# Patient Record
Sex: Female | Born: 1935 | Race: White | Hispanic: No | Marital: Married | Smoking: Never smoker
Health system: Southern US, Community
[De-identification: ages and names within clinical notes are randomized; demographics above are authoritative.]

## PROBLEM LIST (undated history)

## (undated) DIAGNOSIS — Q2730 Arteriovenous malformation, site unspecified: Secondary | ICD-10-CM

## (undated) DIAGNOSIS — E079 Disorder of thyroid, unspecified: Secondary | ICD-10-CM

## (undated) DIAGNOSIS — E611 Iron deficiency: Secondary | ICD-10-CM

## (undated) DIAGNOSIS — I350 Nonrheumatic aortic (valve) stenosis: Secondary | ICD-10-CM

## (undated) DIAGNOSIS — R55 Syncope and collapse: Secondary | ICD-10-CM

## (undated) DIAGNOSIS — Z952 Presence of prosthetic heart valve: Secondary | ICD-10-CM

## (undated) DIAGNOSIS — J45909 Unspecified asthma, uncomplicated: Secondary | ICD-10-CM

## (undated) DIAGNOSIS — F419 Anxiety disorder, unspecified: Secondary | ICD-10-CM

## (undated) DIAGNOSIS — D509 Iron deficiency anemia, unspecified: Secondary | ICD-10-CM

## (undated) DIAGNOSIS — I5032 Chronic diastolic (congestive) heart failure: Secondary | ICD-10-CM

## (undated) DIAGNOSIS — F29 Unspecified psychosis not due to a substance or known physiological condition: Secondary | ICD-10-CM

## (undated) DIAGNOSIS — E538 Deficiency of other specified B group vitamins: Secondary | ICD-10-CM

## (undated) DIAGNOSIS — S72009A Fracture of unspecified part of neck of unspecified femur, initial encounter for closed fracture: Secondary | ICD-10-CM

## (undated) HISTORY — DX: Iron deficiency anemia, unspecified: D50.9

## (undated) HISTORY — PX: FEMUR SURGERY: SHX943

## (undated) HISTORY — PX: HIP FRACTURE SURGERY: SHX118

## (undated) HISTORY — DX: Arteriovenous malformation, site unspecified: Q27.30

## (undated) HISTORY — DX: Syncope and collapse: R55

## (undated) HISTORY — DX: Fracture of unspecified part of neck of unspecified femur, initial encounter for closed fracture: S72.009A

## (undated) HISTORY — PX: FRACTURE SURGERY: SHX138

## (undated) HISTORY — PX: TUBAL LIGATION: SHX77

## (undated) HISTORY — DX: Unspecified asthma, uncomplicated: J45.909

## (undated) HISTORY — PX: BACK SURGERY: SHX140

## (undated) HISTORY — PX: PARTIAL HYSTERECTOMY: SHX80

## (undated) HISTORY — DX: Unspecified psychosis not due to a substance or known physiological condition: F29

## (undated) HISTORY — DX: Disorder of thyroid, unspecified: E07.9

## (undated) HISTORY — PX: CARDIAC CATHETERIZATION: SHX172

## (undated) HISTORY — PX: APPENDECTOMY: SHX54

---

## 2008-01-30 ENCOUNTER — Emergency Department: Payer: Self-pay | Admitting: Emergency Medicine

## 2008-12-05 ENCOUNTER — Inpatient Hospital Stay: Payer: Self-pay | Admitting: Specialist

## 2008-12-05 DIAGNOSIS — S62102A Fracture of unspecified carpal bone, left wrist, initial encounter for closed fracture: Secondary | ICD-10-CM | POA: Insufficient documentation

## 2008-12-10 ENCOUNTER — Encounter: Payer: Self-pay | Admitting: Internal Medicine

## 2009-01-01 ENCOUNTER — Encounter: Payer: Self-pay | Admitting: Internal Medicine

## 2009-06-13 ENCOUNTER — Ambulatory Visit: Payer: Self-pay | Admitting: Gastroenterology

## 2009-07-24 ENCOUNTER — Emergency Department: Payer: Self-pay | Admitting: Internal Medicine

## 2010-06-28 ENCOUNTER — Ambulatory Visit: Payer: Self-pay | Admitting: General Practice

## 2010-08-16 ENCOUNTER — Ambulatory Visit: Payer: Self-pay | Admitting: General Practice

## 2012-10-11 ENCOUNTER — Ambulatory Visit: Payer: Self-pay | Admitting: Emergency Medicine

## 2012-10-11 LAB — COMPREHENSIVE METABOLIC PANEL
Albumin: 4.1 g/dL (ref 3.4–5.0)
Alkaline Phosphatase: 94 U/L (ref 50–136)
Anion Gap: 9 (ref 7–16)
BUN: 22 mg/dL — ABNORMAL HIGH (ref 7–18)
Bilirubin,Total: 0.8 mg/dL (ref 0.2–1.0)
Calcium, Total: 9.2 mg/dL (ref 8.5–10.1)
Chloride: 105 mmol/L (ref 98–107)
Co2: 28 mmol/L (ref 21–32)
Creatinine: 0.74 mg/dL (ref 0.60–1.30)
EGFR (African American): >60
EGFR (Non-African Amer.): >60
Glucose: 78 mg/dL (ref 65–99)
Osmolality: 285 (ref 275–301)
Potassium: 3.8 mmol/L (ref 3.5–5.1)
SGOT(AST): 25 U/L (ref 15–37)
SGPT (ALT): 26 U/L (ref 12–78)
Sodium: 142 mmol/L (ref 136–145)
Total Protein: 7.9 g/dL (ref 6.4–8.2)

## 2012-10-11 LAB — CBC WITH DIFFERENTIAL/PLATELET
Basophil #: 0.1 10*3/uL (ref 0.0–0.1)
Basophil %: 1.1 %
Eosinophil #: 0 10*3/uL (ref 0.0–0.7)
Eosinophil %: 0.5 %
HCT: 36.5 % (ref 35.0–47.0)
HGB: 11.9 g/dL — ABNORMAL LOW (ref 12.0–16.0)
Lymphocyte #: 1.4 10*3/uL (ref 1.0–3.6)
Lymphocyte %: 25.4 %
MCH: 30.2 pg (ref 26.0–34.0)
MCHC: 32.6 g/dL (ref 32.0–36.0)
MCV: 93 fL (ref 80–100)
Monocyte #: 0.3 x10 3/mm (ref 0.2–0.9)
Monocyte %: 6.1 %
Neutrophil #: 3.7 10*3/uL (ref 1.4–6.5)
Neutrophil %: 66.9 %
Platelet: 210 10*3/uL (ref 150–440)
RBC: 3.94 10*6/uL (ref 3.80–5.20)
RDW: 13.7 % (ref 11.5–14.5)
WBC: 5.5 10*3/uL (ref 3.6–11.0)

## 2012-10-11 LAB — SEDIMENTATION RATE: Erythrocyte Sed Rate: 16 mm/hr (ref 0–30)

## 2012-12-02 ENCOUNTER — Ambulatory Visit: Payer: Self-pay | Admitting: Physician Assistant

## 2012-12-24 ENCOUNTER — Ambulatory Visit: Payer: Self-pay | Admitting: Physical Medicine and Rehabilitation

## 2013-01-05 ENCOUNTER — Ambulatory Visit: Payer: Self-pay | Admitting: Orthopedic Surgery

## 2013-01-05 DIAGNOSIS — J45901 Unspecified asthma with (acute) exacerbation: Secondary | ICD-10-CM

## 2013-01-05 LAB — CBC WITH DIFFERENTIAL/PLATELET
BASOS ABS: 0.1 10*3/uL (ref 0.0–0.1)
Basophil %: 1.8 %
EOS PCT: 2 %
Eosinophil #: 0.1 10*3/uL (ref 0.0–0.7)
HCT: 29.3 % — AB (ref 35.0–47.0)
HGB: 9.7 g/dL — AB (ref 12.0–16.0)
Lymphocyte #: 1.4 10*3/uL (ref 1.0–3.6)
Lymphocyte %: 31.3 %
MCH: 29.5 pg (ref 26.0–34.0)
MCHC: 33.1 g/dL (ref 32.0–36.0)
MCV: 89 fL (ref 80–100)
MONO ABS: 0.4 x10 3/mm (ref 0.2–0.9)
Monocyte %: 8.1 %
NEUTROS ABS: 2.6 10*3/uL (ref 1.4–6.5)
NEUTROS PCT: 56.8 %
PLATELETS: 189 10*3/uL (ref 150–440)
RBC: 3.29 10*6/uL — ABNORMAL LOW (ref 3.80–5.20)
RDW: 14.5 % (ref 11.5–14.5)
WBC: 4.5 10*3/uL (ref 3.6–11.0)

## 2013-01-06 ENCOUNTER — Ambulatory Visit: Payer: Self-pay | Admitting: Orthopedic Surgery

## 2013-01-07 LAB — PATHOLOGY REPORT

## 2013-01-21 ENCOUNTER — Emergency Department: Payer: Self-pay | Admitting: Emergency Medicine

## 2013-01-21 LAB — COMPREHENSIVE METABOLIC PANEL
Albumin: 4 g/dL (ref 3.4–5.0)
Alkaline Phosphatase: 75 U/L
Anion Gap: 7 (ref 7–16)
BILIRUBIN TOTAL: 0.6 mg/dL (ref 0.2–1.0)
BUN: 19 mg/dL — ABNORMAL HIGH (ref 7–18)
CALCIUM: 9.3 mg/dL (ref 8.5–10.1)
Chloride: 110 mmol/L — ABNORMAL HIGH (ref 98–107)
Co2: 24 mmol/L (ref 21–32)
Creatinine: 0.72 mg/dL (ref 0.60–1.30)
EGFR (Non-African Amer.): >60
GLUCOSE: 89 mg/dL (ref 65–99)
OSMOLALITY: 283 (ref 275–301)
Potassium: 3.4 mmol/L — ABNORMAL LOW (ref 3.5–5.1)
SGOT(AST): 32 U/L (ref 15–37)
SGPT (ALT): 21 U/L (ref 12–78)
SODIUM: 141 mmol/L (ref 136–145)
Total Protein: 7.3 g/dL (ref 6.4–8.2)

## 2013-01-21 LAB — CBC WITH DIFFERENTIAL/PLATELET
BASOS ABS: 0.1 10*3/uL (ref 0.0–0.1)
BASOS PCT: 1.2 %
EOS PCT: 0.8 %
Eosinophil #: 0 10*3/uL (ref 0.0–0.7)
HCT: 33.4 % — ABNORMAL LOW (ref 35.0–47.0)
HGB: 10.8 g/dL — AB (ref 12.0–16.0)
Lymphocyte #: 1.5 10*3/uL (ref 1.0–3.6)
Lymphocyte %: 31.5 %
MCH: 29.1 pg (ref 26.0–34.0)
MCHC: 32.2 g/dL (ref 32.0–36.0)
MCV: 90 fL (ref 80–100)
MONOS PCT: 7.5 %
Monocyte #: 0.4 x10 3/mm (ref 0.2–0.9)
Neutrophil #: 2.8 10*3/uL (ref 1.4–6.5)
Neutrophil %: 59 %
Platelet: 179 10*3/uL (ref 150–440)
RBC: 3.7 10*6/uL — ABNORMAL LOW (ref 3.80–5.20)
RDW: 14.5 % (ref 11.5–14.5)
WBC: 4.7 10*3/uL (ref 3.6–11.0)

## 2013-01-21 LAB — URINALYSIS, COMPLETE
BACTERIA: NONE SEEN
Bilirubin,UR: NEGATIVE
GLUCOSE, UR: NEGATIVE mg/dL (ref 0–75)
NITRITE: POSITIVE
PH: 5 (ref 4.5–8.0)
Protein: 30
RBC,UR: 3 /HPF (ref 0–5)
SPECIFIC GRAVITY: 1.023 (ref 1.003–1.030)
WBC UR: 75 /HPF (ref 0–5)

## 2013-01-21 LAB — LIPASE, BLOOD: Lipase: 258 U/L (ref 73–393)

## 2013-01-29 ENCOUNTER — Emergency Department: Payer: Self-pay | Admitting: Emergency Medicine

## 2013-01-29 LAB — CBC
HCT: 33.5 % — ABNORMAL LOW (ref 35.0–47.0)
HGB: 11.1 g/dL — ABNORMAL LOW (ref 12.0–16.0)
MCH: 29.7 pg (ref 26.0–34.0)
MCHC: 33.1 g/dL (ref 32.0–36.0)
MCV: 90 fL (ref 80–100)
Platelet: 132 10*3/uL — ABNORMAL LOW (ref 150–440)
RBC: 3.74 10*6/uL — ABNORMAL LOW (ref 3.80–5.20)
RDW: 14.4 % (ref 11.5–14.5)
WBC: 4.4 10*3/uL (ref 3.6–11.0)

## 2013-01-29 LAB — COMPREHENSIVE METABOLIC PANEL
ANION GAP: 4 — AB (ref 7–16)
Albumin: 3.6 g/dL (ref 3.4–5.0)
Alkaline Phosphatase: 72 U/L
BUN: 16 mg/dL (ref 7–18)
Bilirubin,Total: 0.7 mg/dL (ref 0.2–1.0)
CHLORIDE: 110 mmol/L — AB (ref 98–107)
CO2: 27 mmol/L (ref 21–32)
CREATININE: 0.85 mg/dL (ref 0.60–1.30)
Calcium, Total: 9 mg/dL (ref 8.5–10.1)
EGFR (African American): >60
Glucose: 92 mg/dL (ref 65–99)
Osmolality: 282 (ref 275–301)
Potassium: 3.3 mmol/L — ABNORMAL LOW (ref 3.5–5.1)
SGOT(AST): 29 U/L (ref 15–37)
SGPT (ALT): 14 U/L (ref 12–78)
Sodium: 141 mmol/L (ref 136–145)
TOTAL PROTEIN: 7 g/dL (ref 6.4–8.2)

## 2013-01-29 LAB — URINALYSIS, COMPLETE
BILIRUBIN, UR: NEGATIVE
Blood: NEGATIVE
Glucose,UR: NEGATIVE mg/dL (ref 0–75)
Ketone: NEGATIVE
Nitrite: NEGATIVE
Ph: 5 (ref 4.5–8.0)
RBC,UR: 2 /HPF (ref 0–5)
Specific Gravity: 1.019 (ref 1.003–1.030)

## 2013-01-29 LAB — LIPASE, BLOOD: Lipase: 203 U/L (ref 73–393)

## 2013-04-24 ENCOUNTER — Ambulatory Visit (INDEPENDENT_AMBULATORY_CARE_PROVIDER_SITE_OTHER): Payer: Medicare Other | Admitting: Cardiovascular Disease

## 2013-04-24 ENCOUNTER — Encounter: Payer: Self-pay | Admitting: Cardiovascular Disease

## 2013-04-24 ENCOUNTER — Encounter (INDEPENDENT_AMBULATORY_CARE_PROVIDER_SITE_OTHER): Payer: Self-pay

## 2013-04-24 VITALS — BP 150/78 | HR 70 | Ht 65.0 in | Wt 117.2 lb

## 2013-04-24 DIAGNOSIS — R011 Cardiac murmur, unspecified: Secondary | ICD-10-CM

## 2013-04-24 DIAGNOSIS — R030 Elevated blood-pressure reading, without diagnosis of hypertension: Secondary | ICD-10-CM

## 2013-04-24 DIAGNOSIS — R0602 Shortness of breath: Secondary | ICD-10-CM

## 2013-04-24 NOTE — Patient Instructions (Addendum)
Your physician has requested that you have an echocardiogram. Echocardiography is a painless test that uses sound waves to create images of your heart. It provides your doctor with information about the size and shape of your heart and how well your heart's chambers and valves are working. This procedure takes approximately one hour. There are no restrictions for this procedure.  [facility] MY[facility]IEW  Your caregiver has ordered a Stress Test with nuclear imaging. The purpose of this test is to evaluate the blood supply to your heart muscle. This procedure is referred to as a "Non-Invasive Stress Test." This is because other than having an IV started in your vein, nothing is inserted or "invades" your body. Cardiac stress tests are done to find areas of poor blood flow to the heart by determining the extent of coronary artery disease (CAD). Some patients exercise on a treadmill, which naturally increases the blood flow to your heart, while others who are  unable to walk on a treadmill due to physical limitations have a pharmacologic/chemical stress agent called Lexiscan . This medicine will mimic walking on a treadmill by temporarily increasing your coronary blood flow.   Please note: these test may take anywhere between 2-4 hours to complete  PLEASE REPORT TO [facility]  THE VOLUNTEERS AT THE FIRST DESK WILL DIRECT YOU WHERE TO GO  Date of Procedure:_______________05/05/15______________________  Arrival Time for Procedure:__________0745 am____________________  Instructions regarding medication:    PLEASE NOTIFY THE OFFICE AT LEAST 24 HOURS IN ADVANCE IF YOU ARE UNABLE TO KEEP YOUR APPOINTMENT.  [phone] AND  PLEASE NOTIFY [facility] AT [facility] AT LEAST 24 HOURS IN ADVANCE IF YOU ARE UNABLE TO KEEP YOUR APPOINTMENT. [phone]  How to prepare for your Myoview test:  1. Do not eat or drink after midnight 2. No caffeine for 24 hours prior to test 3. No smoking 24 hours prior  to test. 4. Your medication may be taken with water.  If your doctor stopped a medication because of this test, do not take that medication. 5. Ladies, please do not wear dresses.  Skirts or pants are appropriate. Please wear a short sleeve shirt. 6. No perfume, cologne or lotion. 7. Wear comfortable walking shoes. No heels!       Your physician recommends that you schedule a follow-up appointment in:  After your tests

## 2013-04-26 ENCOUNTER — Encounter: Payer: Self-pay | Admitting: Cardiovascular Disease

## 2013-04-26 DIAGNOSIS — R030 Elevated blood-pressure reading, without diagnosis of hypertension: Secondary | ICD-10-CM | POA: Insufficient documentation

## 2013-04-26 DIAGNOSIS — R0602 Shortness of breath: Secondary | ICD-10-CM | POA: Insufficient documentation

## 2013-04-26 DIAGNOSIS — R011 Cardiac murmur, unspecified: Secondary | ICD-10-CM | POA: Insufficient documentation

## 2013-04-26 NOTE — Assessment & Plan Note (Signed)
She has a cardiac murmur suggestive of LVOT obstruction and less likely aortic stenosis. Given her worsening exertional dyspnea, I recommend an echocardiogram for evaluation.

## 2013-04-26 NOTE — Assessment & Plan Note (Signed)
She reports significant exertional dyspnea without chest pain. She is at risk for coronary artery disease. I recommend evaluation with a pharmacologic nuclear stress test.

## 2013-04-26 NOTE — Assessment & Plan Note (Signed)
Continue monitoring for now and consider treatment based on cardiac workup.

## 2013-04-26 NOTE — Progress Notes (Addendum)
Primary care physician: [place] [place]le[place] [facility] [place]  HPI  This is a pleasant 80 year old female who is self-referred for evaluation of dyspnea. [place] [facility] [place] to be close to family. She continues to see her primary care physician there. She is not aware of any previous cardiac history. She reports elevated blood pressure over the last 1-2 years but has not been on medications. There is no history of diabetes or hyperlipidemia. She reports history of asthma and hip surgery. Previous records are not available. She was told about a heart murmur few months ago. She reports having a syncopal episode about 9 years ago and was associated with tachycardia. She is not aware of documented arrhythmia. She noticed worsening exertional dyspnea without chest pain over the last few months. Recently, she was walking with her dog which got loose. She chased the dog and became extremely dyspneic. No orthopnea or PND. There is family history of coronary artery disease but not prematurely. There is no history of tobacco or alcohol use.  Some of her records were reviewed. She saw Dr. Gifford ShaveNeelam ann Shaukat Khan in 2011. She had an echocardiogram which showed normal LV systolic function with mild mitral regurgitation. A nuclear stress test was normal. Carotid Doppler was also reported to be normal.  Allergies  Allergen Reactions  . Penicillins     antibiotics     No current outpatient prescriptions on file prior to visit.   No current facility-administered medications on file prior to visit.     Past Medical History  Diagnosis Date  . Fractured hip   . Broken wrist   . Syncope and collapse   . Heart murmur   . Arrhythmia   . Asthma   . Broken back      Past Surgical History  Procedure Laterality Date  . Hip fracture surgery    . Tubal ligation    . Partial hysterectomy    . Back surgery      back fusion     Family History  Problem  Relation Age of Onset  . Heart disease Sister      History   Social History  . Marital Status: Married    Spouse Name: N/A    Number of Children: N/A  . Years of Education: N/A   Occupational History  . Not on file.   Social History Main Topics  . Smoking status: Never Smoker   . Smokeless tobacco: Not on file  . Alcohol Use: No  . Drug Use: No  . Sexual Activity: Not on file   Other Topics Concern  . Not on file   Social History Narrative  . No narrative on file     ROS A 10 point review of system was performed. It is negative other than that mentioned in the history of present illness.   PHYSICAL EXAM   BP 150/78  Pulse 70  Ht 5\' 5"  (1.651 m)  Wt 117 lb 4 oz (53.184 kg)  BMI 19.51 kg/m2 Constitutional: She is oriented to person, place, and time. She appears well-developed and well-nourished. No distress.  HENT: No nasal discharge.  Head: Normocephalic and atraumatic.  Eyes: Pupils are equal and round. No discharge.  Neck: Normal range of motion. Neck supple. No JVD present. No thyromegaly present.  Cardiovascular: Normal rate, regular rhythm, normal heart sounds. Exam reveals no gallop and no friction rub. There is a 3/6 systolic ejection murmur at the left sternal border  and aortic area. The murmur is mid peaking with preserved S2 and a louder with Valsalva. Pulmonary/Chest: Effort normal and breath sounds normal. No stridor. No respiratory distress. She has no wheezes. She has no rales. She exhibits no tenderness.  Abdominal: Soft. Bowel sounds are normal. She exhibits no distension. There is no tenderness. There is no rebound and no guarding.  Musculoskeletal: Normal range of motion. She exhibits no edema and no tenderness.  Neurological: She is alert and oriented to person, place, and time. Coordination normal.  Skin: Skin is warm and dry. No rash noted. She is not diaphoretic. No erythema. No pallor.  Psychiatric: She has a normal mood and affect. Her  behavior is normal. Judgment and thought content normal.     EKG: Normal sinus rhythm with left atrial enlargement, left ventricular hypertrophy with repolarization abnormalities   ASSESSMENT AND PLAN

## 2013-04-30 ENCOUNTER — Other Ambulatory Visit (INDEPENDENT_AMBULATORY_CARE_PROVIDER_SITE_OTHER): Payer: Medicare Other

## 2013-04-30 ENCOUNTER — Other Ambulatory Visit: Payer: Self-pay

## 2013-04-30 DIAGNOSIS — I369 Nonrheumatic tricuspid valve disorder, unspecified: Secondary | ICD-10-CM

## 2013-04-30 DIAGNOSIS — R0989 Other specified symptoms and signs involving the circulatory and respiratory systems: Secondary | ICD-10-CM

## 2013-04-30 DIAGNOSIS — R0602 Shortness of breath: Secondary | ICD-10-CM

## 2013-04-30 DIAGNOSIS — R0609 Other forms of dyspnea: Secondary | ICD-10-CM

## 2013-05-05 ENCOUNTER — Ambulatory Visit: Payer: Self-pay | Admitting: Cardiovascular Disease

## 2013-05-05 DIAGNOSIS — R079 Chest pain, unspecified: Secondary | ICD-10-CM

## 2013-05-06 ENCOUNTER — Other Ambulatory Visit: Payer: Self-pay

## 2013-05-06 DIAGNOSIS — R0602 Shortness of breath: Secondary | ICD-10-CM

## 2013-05-12 ENCOUNTER — Telehealth: Payer: Self-pay

## 2013-05-12 DIAGNOSIS — R0602 Shortness of breath: Secondary | ICD-10-CM

## 2013-05-12 DIAGNOSIS — R011 Cardiac murmur, unspecified: Secondary | ICD-10-CM

## 2013-05-12 NOTE — Telephone Encounter (Signed)
Spoke w/ French Anaracy.  We will try to work pt in on Friday if it is convenient for her.  Left message for pt to call back.

## 2013-05-12 NOTE — Telephone Encounter (Signed)
Message copied by Marilynne HalstedMOODY, Tacara Hadlock R on Tue May 12, 2013  8:43 AM ------      Message from: Antonieta IbaGOLLAN, TIMOTHY J      Created: Mon May 11, 2013  2:09 PM       Can we check with French Anaracy to seen when patient might be able to come in for a few minutes to take a few more pictures of her aortic valve. No all measurements made on recent echo.      Free of charge      thx      Tim            ----- Message -----         From: Iran OuchMuhammad A Arida, MD         Sent: 05/07/2013   3:43 PM           To: Antonieta Ibaimothy J Gollan, MD, Alonza Smokerracy L Farver            I reviewed the echo images done on my patient. Aortic valve is calcified and seems stenotic. However, I don't see no measured gradients or area. Was that not done?        ------

## 2013-05-12 NOTE — Telephone Encounter (Signed)
Message copied by Marilynne HalstedMOODY, Lorenz Donley R on Tue May 12, 2013  8:42 AM ------      Message from: Antonieta IbaGOLLAN, TIMOTHY J      Created: Mon May 11, 2013  2:09 PM       Can we check with French Anaracy to seen when patient might be able to come in for a few minutes to take a few more pictures of her aortic valve. No all measurements made on recent echo.      Free of charge      thx      Tim            ----- Message -----         From: Iran OuchMuhammad A Arida, MD         Sent: 05/07/2013   3:43 PM           To: Antonieta Ibaimothy J Gollan, MD, Alonza Smokerracy L Farver            I reviewed the echo images done on my patient. Aortic valve is calcified and seems stenotic. However, I don't see no measured gradients or area. Was that not done?        ------

## 2013-05-12 NOTE — Telephone Encounter (Signed)
Pt sched to come in for ECHO Friday, 05/15/13 @ 3:30.

## 2013-05-15 ENCOUNTER — Other Ambulatory Visit: Payer: Medicare Other

## 2013-06-01 ENCOUNTER — Telehealth: Payer: Self-pay

## 2013-06-01 NOTE — Telephone Encounter (Signed)
Informed patient that per Dr. Kirke Corin her echo did not change from the original  Please follow up in  6 months  Patient verbalized understanding

## 2013-06-01 NOTE — Telephone Encounter (Signed)
Pt would like to know echo results. Please call. 

## 2013-07-06 DIAGNOSIS — M7060 Trochanteric bursitis, unspecified hip: Secondary | ICD-10-CM | POA: Insufficient documentation

## 2013-07-06 DIAGNOSIS — M169 Osteoarthritis of hip, unspecified: Secondary | ICD-10-CM | POA: Insufficient documentation

## 2013-07-27 ENCOUNTER — Ambulatory Visit (INDEPENDENT_AMBULATORY_CARE_PROVIDER_SITE_OTHER): Payer: Medicare Other | Admitting: Cardiovascular Disease

## 2013-07-27 ENCOUNTER — Encounter: Payer: Self-pay | Admitting: Cardiovascular Disease

## 2013-07-27 VITALS — BP 138/60 | HR 68 | Ht 65.0 in | Wt 121.2 lb

## 2013-07-27 DIAGNOSIS — R0602 Shortness of breath: Secondary | ICD-10-CM

## 2013-07-27 DIAGNOSIS — I359 Nonrheumatic aortic valve disorder, unspecified: Secondary | ICD-10-CM

## 2013-07-27 DIAGNOSIS — I350 Nonrheumatic aortic (valve) stenosis: Secondary | ICD-10-CM

## 2013-07-27 NOTE — Patient Instructions (Signed)
Your physician wants you to follow-up in: 6 months with Dr. Kirke CorinArida. You will receive a reminder letter in the mail two months in advance. If you don't receive a letter, please call our office to schedule the follow-up appointment.   Please follow low sodium diet given

## 2013-07-27 NOTE — Assessment & Plan Note (Signed)
Nuclear stress test was negative. Dyspnea is likely multifactorial. There is likely a component of diastolic dysfunction. Pulmonary pressure was elevated at 47 mm mercury. The correlation of dyspnea with increased sodium intake supports this. I suggested a small dose loop diuretic. However, the patient is hesitant to take any medications. We provided her with low sodium diet instructions. We can consider a small dose diuretic if she does not improve. If symptoms persist, I will consider proceeding with a right and left cardiac catheterization.

## 2013-07-27 NOTE — Progress Notes (Signed)
Primary care physician: Dr. Meredeth Ide in [facility]  HPI  This is a pleasant 80 year old female who is here today for a followup visit regarding dyspnea. She moved from [facility] about 6 years ago to be close to family. She continues to see her primary care physician there. She was seen a few months ago for exertional dyspnea and elevated blood pressure.  There is no history of diabetes or hyperlipidemia. She reports history of asthma and hip surgery.   She saw Dr. Gifford Shave in 2011. She had an echocardiogram which showed normal LV systolic function with mild mitral regurgitation. A nuclear stress test was normal. Carotid Doppler was also reported to be normal. She was noted to have a cardiac murmur suggestive of possible  aortic stenosis versus LVOT obstruction. She underwent an echocardiogram which showed normal LV systolic function, grade 1 diastolic dysfunction, moderate aortic stenosis with a mean gradient of 26 mm marker and valve area of 1.1. There was mild mitral regurgitation with estimated systolic pulmonary artery pressure 47 mm mercury. She underwent a pharmacologic nuclear stress test which showed no evidence of ischemia. She continues to complain of exertional dyspnea especially if she consumes excessive amount of sodium. No chest discomfort.  Allergies  Allergen Reactions  . Penicillins     antibiotics     Current Outpatient Prescriptions on File Prior to Visit  Medication Sig Dispense Refill  . Cholecalciferol (VITAMIN D-3) 1000 UNITS CAPS Take by mouth daily.      . Flaxseed, Linseed, (FLAXSEED OIL PO) Take by mouth daily.       No current facility-administered medications on file prior to visit.     Past Medical History  Diagnosis Date  . Fractured hip   . Broken wrist   . Syncope and collapse   . Heart murmur   . Arrhythmia   . Asthma   . Broken back      Past Surgical History  Procedure Laterality Date  .  Hip fracture surgery    . Tubal ligation    . Partial hysterectomy    . Back surgery      back fusion     Family History  Problem Relation Age of Onset  . Heart disease Sister      History   Social History  . Marital Status: Married    Spouse Name: N/A    Number of Children: N/A  . Years of Education: N/A   Occupational History  . Not on file.   Social History Main Topics  . Smoking status: Never Smoker   . Smokeless tobacco: Not on file  . Alcohol Use: No  . Drug Use: No  . Sexual Activity: Not on file   Other Topics Concern  . Not on file   Social History Narrative  . No narrative on file     ROS A 10 point review of system was performed. It is negative other than that mentioned in the history of present illness.   PHYSICAL EXAM   BP 138/60  Pulse 68  Ht 5\' 5"  (1.651 m)  Wt 121 lb 4 oz (54.999 kg)  BMI 20.18 kg/m2 Constitutional: She is oriented to person, place, and time. She appears well-developed and well-nourished. No distress.  HENT: No nasal discharge.  Head: Normocephalic and atraumatic.  Eyes: Pupils are equal and round. No discharge.  Neck: Normal range of motion. Neck supple. No JVD present. No thyromegaly present.  Cardiovascular: Normal rate, regular  rhythm, normal heart sounds. Exam reveals no gallop and no friction rub. There is a 3/6 systolic ejection murmur at the left sternal border and aortic area. The murmur is mid peaking with preserved S2 . Pulmonary/Chest: Effort normal and breath sounds normal. No stridor. No respiratory distress. She has no wheezes. She has no rales. She exhibits no tenderness.  Abdominal: Soft. Bowel sounds are normal. She exhibits no distension. There is no tenderness. There is no rebound and no guarding.  Musculoskeletal: Normal range of motion. She exhibits +1 edema and no tenderness.  Neurological: She is alert and oriented to person, place, and time. Coordination normal.  Skin: Skin is warm and dry. No rash  noted. She is not diaphoretic. No erythema. No pallor.  Psychiatric: She has a normal mood and affect. Her behavior is normal. Judgment and thought content normal.       ASSESSMENT AND PLAN

## 2013-07-27 NOTE — Assessment & Plan Note (Signed)
The patient has moderate aortic stenosis for which I recommend observation. The degree of stenosis does not explain her dyspnea. I will plan on repeating an echocardiogram in one year.

## 2013-12-16 DIAGNOSIS — M5416 Radiculopathy, lumbar region: Secondary | ICD-10-CM | POA: Insufficient documentation

## 2013-12-28 ENCOUNTER — Telehealth: Payer: Self-pay | Admitting: Cardiovascular Disease

## 2013-12-28 NOTE — Telephone Encounter (Signed)
Patient stated she had a medical visit last Thursday and her HB was 7.22 Patient stated they told her to call her PCP and find out if he recommends she have a blood transfusion  Patient wants to know if Dr. Kirke CorinArida can check her blood an order a blood transfusion if need be    I informed patient per verbal from Dr. Kirke CorinArida   Patient needs to follow up with her PCP or go to the ED  Patient verbalized understanding

## 2013-12-28 NOTE — Telephone Encounter (Signed)
Pt needs a blood transfusion and wants to know if doctor Kirke Corinarida can help. All her doctors are in [place] and she said last time she was here doctor Kirke Corinarida helped her. Please advise.

## 2014-01-01 ENCOUNTER — Ambulatory Visit: Payer: Self-pay | Admitting: Internal Medicine

## 2014-01-04 ENCOUNTER — Inpatient Hospital Stay: Payer: Self-pay | Admitting: Specialist

## 2014-01-04 LAB — COMPREHENSIVE METABOLIC PANEL
ALK PHOS: 57 U/L
ANION GAP: 7 (ref 7–16)
Albumin: 3.2 g/dL — ABNORMAL LOW (ref 3.4–5.0)
BUN: 22 mg/dL — ABNORMAL HIGH (ref 7–18)
Bilirubin,Total: 0.4 mg/dL (ref 0.2–1.0)
CO2: 25 mmol/L (ref 21–32)
Calcium, Total: 8 mg/dL — ABNORMAL LOW (ref 8.5–10.1)
Chloride: 112 mmol/L — ABNORMAL HIGH (ref 98–107)
Creatinine: 0.94 mg/dL (ref 0.60–1.30)
EGFR (Non-African Amer.): >60
Glucose: 88 mg/dL (ref 65–99)
OSMOLALITY: 290 (ref 275–301)
Potassium: 3.7 mmol/L (ref 3.5–5.1)
SGOT(AST): 28 U/L (ref 15–37)
SGPT (ALT): 19 U/L
SODIUM: 144 mmol/L (ref 136–145)
Total Protein: 6.3 g/dL — ABNORMAL LOW (ref 6.4–8.2)

## 2014-01-04 LAB — FERRITIN: Ferritin (ARMC): 7 ng/mL — ABNORMAL LOW (ref 8–388)

## 2014-01-04 LAB — CBC
HCT: 18.4 % — AB (ref 35.0–47.0)
HGB: 5.5 g/dL — ABNORMAL LOW (ref 12.0–16.0)
MCH: 26.6 pg (ref 26.0–34.0)
MCHC: 30 g/dL — AB (ref 32.0–36.0)
MCV: 89 fL (ref 80–100)
Platelet: 183 10*3/uL (ref 150–440)
RBC: 2.08 10*6/uL — ABNORMAL LOW (ref 3.80–5.20)
RDW: 16.9 % — AB (ref 11.5–14.5)
WBC: 3.6 10*3/uL (ref 3.6–11.0)

## 2014-01-04 LAB — IRON AND TIBC
IRON BIND. CAP.(TOTAL): 360 ug/dL (ref 250–450)
IRON: 21 ug/dL — AB (ref 50–170)
Iron Saturation: 6 %
Unbound Iron-Bind.Cap.: 339 ug/dL

## 2014-01-04 LAB — PROTIME-INR
INR: 1.1
Prothrombin Time: 13.6 secs (ref 11.5–14.7)

## 2014-01-04 LAB — APTT: Activated PTT: 38.8 secs — ABNORMAL HIGH (ref 23.6–35.9)

## 2014-01-04 LAB — TROPONIN I: Troponin-I: <0.02 ng/mL

## 2014-01-05 LAB — CBC WITH DIFFERENTIAL/PLATELET
BASOS PCT: 2.3 %
Basophil #: 0.1 10*3/uL (ref 0.0–0.1)
Basophil #: 0.1 10*3/uL (ref 0.0–0.1)
Basophil #: 0.1 10*3/uL (ref 0.0–0.1)
Basophil %: 1.6 %
Basophil %: 1.4 %
EOS PCT: 3.4 %
EOS PCT: 2.1 %
Eosinophil #: 0.2 10*3/uL (ref 0.0–0.7)
Eosinophil #: 0.1 10*3/uL (ref 0.0–0.7)
Eosinophil #: 0.2 10*3/uL (ref 0.0–0.7)
Eosinophil %: 3.1 %
HCT: 28.1 % — AB (ref 35.0–47.0)
HCT: 28.6 % — AB (ref 35.0–47.0)
HCT: 30.6 % — AB (ref 35.0–47.0)
HGB: 9.1 g/dL — AB (ref 12.0–16.0)
HGB: 9.2 g/dL — ABNORMAL LOW (ref 12.0–16.0)
HGB: 9.6 g/dL — AB (ref 12.0–16.0)
LYMPHS PCT: 32 %
LYMPHS PCT: 17.8 %
Lymphocyte #: 1.8 10*3/uL (ref 1.0–3.6)
Lymphocyte #: 0.9 10*3/uL — ABNORMAL LOW (ref 1.0–3.6)
Lymphocyte #: 1.5 10*3/uL (ref 1.0–3.6)
Lymphocyte %: 25.9 %
MCH: 28 pg (ref 26.0–34.0)
MCH: 28.2 pg (ref 26.0–34.0)
MCH: 27.6 pg (ref 26.0–34.0)
MCHC: 32.3 g/dL (ref 32.0–36.0)
MCHC: 32 g/dL (ref 32.0–36.0)
MCHC: 31.2 g/dL — ABNORMAL LOW (ref 32.0–36.0)
MCV: 87 fL (ref 80–100)
MCV: 88 fL (ref 80–100)
MCV: 89 fL (ref 80–100)
MONOS PCT: 7.3 %
MONOS PCT: 7.6 %
Monocyte #: 0.5 x10 3/mm (ref 0.2–0.9)
Monocyte #: 0.4 x10 3/mm (ref 0.2–0.9)
Monocyte #: 0.4 x10 3/mm (ref 0.2–0.9)
Monocyte %: 8.6 %
NEUTROS ABS: 3 10*3/uL (ref 1.4–6.5)
NEUTROS ABS: 3.7 10*3/uL (ref 1.4–6.5)
NEUTROS PCT: 53.7 %
Neutrophil #: 3.6 10*3/uL (ref 1.4–6.5)
Neutrophil %: 71.2 %
Neutrophil %: 62 %
PLATELETS: 183 10*3/uL (ref 150–440)
Platelet: 178 10*3/uL (ref 150–440)
Platelet: 189 10*3/uL (ref 150–440)
RBC: 3.25 10*6/uL — AB (ref 3.80–5.20)
RBC: 3.25 10*6/uL — ABNORMAL LOW (ref 3.80–5.20)
RBC: 3.46 10*6/uL — ABNORMAL LOW (ref 3.80–5.20)
RDW: 16 % — ABNORMAL HIGH (ref 11.5–14.5)
RDW: 16 % — AB (ref 11.5–14.5)
RDW: 15.8 % — AB (ref 11.5–14.5)
WBC: 5.6 10*3/uL (ref 3.6–11.0)
WBC: 5.3 10*3/uL (ref 3.6–11.0)
WBC: 5.8 10*3/uL (ref 3.6–11.0)

## 2014-01-05 LAB — OCCULT BLOOD X 1 CARD TO LAB, STOOL: Occult Blood, Feces: NEGATIVE

## 2014-01-05 LAB — FOLATE: Folic Acid: 30.2 ng/mL — ABNORMAL HIGH (ref 3.1–17.5)

## 2014-01-06 LAB — UR PROT ELECTROPHORESIS, URINE RANDOM

## 2014-01-06 LAB — PROT IMMUNOELECTROPHORES(ARMC)

## 2014-01-19 ENCOUNTER — Ambulatory Visit: Payer: Self-pay | Admitting: Internal Medicine

## 2014-01-19 LAB — CBC CANCER CENTER
BASOS ABS: 0.1 x10 3/mm (ref 0.0–0.1)
BASOS PCT: 1.7 %
EOS ABS: 0.1 x10 3/mm (ref 0.0–0.7)
Eosinophil %: 1.8 %
HCT: 30.9 % — ABNORMAL LOW (ref 35.0–47.0)
HGB: 9.9 g/dL — ABNORMAL LOW (ref 12.0–16.0)
Lymphocyte #: 1.3 x10 3/mm (ref 1.0–3.6)
Lymphocyte %: 22.7 %
MCH: 28.3 pg (ref 26.0–34.0)
MCHC: 32 g/dL (ref 32.0–36.0)
MCV: 88 fL (ref 80–100)
MONOS PCT: 7 %
Monocyte #: 0.4 x10 3/mm (ref 0.2–0.9)
NEUTROS PCT: 66.8 %
Neutrophil #: 3.8 x10 3/mm (ref 1.4–6.5)
Platelet: 229 x10 3/mm (ref 150–440)
RBC: 3.49 10*6/uL — ABNORMAL LOW (ref 3.80–5.20)
RDW: 19 % — ABNORMAL HIGH (ref 11.5–14.5)
WBC: 5.6 x10 3/mm (ref 3.6–11.0)

## 2014-02-01 ENCOUNTER — Ambulatory Visit: Payer: Self-pay | Admitting: Internal Medicine

## 2014-02-19 ENCOUNTER — Emergency Department: Payer: Self-pay | Admitting: Emergency Medicine

## 2014-03-01 ENCOUNTER — Encounter: Payer: Self-pay | Admitting: Cardiovascular Disease

## 2014-03-01 ENCOUNTER — Ambulatory Visit (INDEPENDENT_AMBULATORY_CARE_PROVIDER_SITE_OTHER): Payer: Medicare Other | Admitting: Cardiovascular Disease

## 2014-03-01 VITALS — BP 138/48 | HR 65 | Ht 65.0 in | Wt 119.5 lb

## 2014-03-01 DIAGNOSIS — I350 Nonrheumatic aortic (valve) stenosis: Secondary | ICD-10-CM

## 2014-03-01 DIAGNOSIS — R030 Elevated blood-pressure reading, without diagnosis of hypertension: Secondary | ICD-10-CM

## 2014-03-01 DIAGNOSIS — K922 Gastrointestinal hemorrhage, unspecified: Secondary | ICD-10-CM | POA: Insufficient documentation

## 2014-03-01 DIAGNOSIS — K921 Melena: Secondary | ICD-10-CM

## 2014-03-01 DIAGNOSIS — R079 Chest pain, unspecified: Secondary | ICD-10-CM

## 2014-03-01 NOTE — Assessment & Plan Note (Signed)
Given presence of aortic stenosis, I recommend that we don't lower blood pressure below 140 systolic.

## 2014-03-01 NOTE — Patient Instructions (Signed)
Your physician wants you to follow-up in:  6 months. You will receive a reminder letter in the mail two months in advance. If you don't receive a letter, please call our office to schedule the follow-up appointment.   

## 2014-03-01 NOTE — Assessment & Plan Note (Signed)
Given the presence of aortic stenosis. This might be due to AV malformation.

## 2014-03-01 NOTE — Progress Notes (Signed)
Primary care physician: Dr. [place]leming [place]. she is going to establish with Dr. Judithann SheenSparks  HPI  This is a pleasant 81 year old female who is here today for a followup visit regarding dyspnea. [place] [facility] to be close to family. She continued to see her primary care physician there. She was seen last year for exertional dyspnea and elevated blood pressure.  There is no history of diabetes or hyperlipidemia. She reports history of asthma and hip surgery.   She saw Dr. Ezekiel SlocumbNeelam and Adrian BlackwaterShaukat Khan in 2011. She had an echocardiogram which showed normal LV systolic function with mild mitral regurgitation. A nuclear stress test was normal. Carotid Doppler was also reported to be normal. She was noted to have a cardiac murmur suggestive of possible  aortic stenosis. She underwent an echocardiogram which showed normal LV systolic function, grade 1 diastolic dysfunction, moderate aortic stenosis with a mean gradient of 26 mm marker and valve area of 1.1. There was mild mitral regurgitation with estimated systolic pulmonary artery pressure 47 mm mercury. She underwent a pharmacologic nuclear stress test which showed no evidence of ischemia. In December 2015, she had black stool and was found to be severely anemic with a hemoglobin of 7.2. [place] in [place] and underwent transfusion. According to the patient, no obvious source of bleeding was identified in spite of extensive GI workup. She was diagnosed with severe iron deficiency anemia and was started on IV iron with improvement. She is feeling better.  Allergies  Allergen Reactions  . Penicillins     antibiotics     No current outpatient prescriptions on file prior to visit.   No current facility-administered medications on file prior to visit.     Past Medical History  Diagnosis Date  . Fractured hip   . Broken wrist   . Syncope and collapse   . Heart murmur   . Arrhythmia     . Asthma   . Broken back   . Broken wrist      Past Surgical History  Procedure Laterality Date  . Hip fracture surgery    . Tubal ligation    . Partial hysterectomy    . Back surgery      back fusion     Family History  Problem Relation Age of Onset  . Heart disease Sister      History   Social History  . Marital Status: Married    Spouse Name: N/A  . Number of Children: N/A  . Years of Education: N/A   Occupational History  . Not on file.   Social History Main Topics  . Smoking status: Never Smoker   . Smokeless tobacco: Not on file  . Alcohol Use: No  . Drug Use: No  . Sexual Activity: Not on file   Other Topics Concern  . Not on file   Social History Narrative     ROS A 10 point review of system was performed. It is negative other than that mentioned in the history of present illness.   PHYSICAL EXAM   BP 138/48 mmHg  Pulse 65  Ht 5\' 5"  (1.651 m)  Wt 119 lb 8 oz (54.205 kg)  BMI 19.89 kg/m2 Constitutional: She is oriented to person, place, and time. She appears well-developed and well-nourished. No distress.  HENT: No nasal discharge.  Head: Normocephalic and atraumatic.  Eyes: Pupils are equal and round. No discharge.  Neck: Normal range of motion. Neck  supple. No JVD present. No thyromegaly present.  Cardiovascular: Normal rate, regular rhythm, normal heart sounds. Exam reveals no gallop and no friction rub. There is a 3/6 systolic ejection murmur at the left sternal border and aortic area. The murmur is mid peaking with preserved S2 . Pulmonary/Chest: Effort normal and breath sounds normal. No stridor. No respiratory distress. She has no wheezes. She has no rales. She exhibits no tenderness.  Abdominal: Soft. Bowel sounds are normal. She exhibits no distension. There is no tenderness. There is no rebound and no guarding.  Musculoskeletal: Normal range of motion. She exhibits +1 edema and no tenderness.  Neurological: She is alert and  oriented to person, place, and time. Coordination normal.  Skin: Skin is warm and dry. No rash noted. She is not diaphoretic. No erythema. No pallor.  Psychiatric: She has a normal mood and affect. Her behavior is normal. Judgment and thought content normal.     ZOX:WRUEA  Rhythm  -Left atrial enlargement.   -Poor R-wave progression -nonspecific -consider old anterior infarct.   BORDERLINE   ASSESSMENT AND PLAN

## 2014-03-01 NOTE — Assessment & Plan Note (Signed)
She has moderate aortic stenosis for which I recommend continued observation and monitoring. There is no indication for surgery at the present time.

## 2014-03-02 ENCOUNTER — Ambulatory Visit
Admit: 2014-03-02 | Disposition: A | Discharge status: Home / Self Care | Payer: Self-pay | Attending: Internal Medicine | Admitting: Internal Medicine

## 2014-03-03 ENCOUNTER — Ambulatory Visit: Payer: Self-pay | Admitting: Unknown Physician Specialty

## 2014-03-05 DIAGNOSIS — J4520 Mild intermittent asthma, uncomplicated: Secondary | ICD-10-CM | POA: Insufficient documentation

## 2014-03-30 LAB — CBC CANCER CENTER
BASOS PCT: 1.6 %
Basophil #: 0.1 x10 3/mm (ref 0.0–0.1)
EOS ABS: 0.1 x10 3/mm (ref 0.0–0.7)
Eosinophil %: 1.4 %
HCT: 31.9 % — ABNORMAL LOW (ref 35.0–47.0)
HGB: 10.4 g/dL — AB (ref 12.0–16.0)
Lymphocyte #: 1.2 x10 3/mm (ref 1.0–3.6)
Lymphocyte %: 28.4 %
MCH: 30.1 pg (ref 26.0–34.0)
MCHC: 32.8 g/dL (ref 32.0–36.0)
MCV: 92 fL (ref 80–100)
MONO ABS: 0.3 x10 3/mm (ref 0.2–0.9)
MONOS PCT: 6.1 %
NEUTROS PCT: 62.5 %
Neutrophil #: 2.7 x10 3/mm (ref 1.4–6.5)
Platelet: 184 x10 3/mm (ref 150–440)
RBC: 3.47 10*6/uL — AB (ref 3.80–5.20)
RDW: 15.7 % — ABNORMAL HIGH (ref 11.5–14.5)
WBC: 4.4 x10 3/mm (ref 3.6–11.0)

## 2014-03-30 LAB — IRON AND TIBC
IRON: 47 ug/dL
Iron Bind.Cap.(Total): 318 (ref 250–450)
Iron Saturation: 14.8
UNBOUND IRON-BIND. CAP.: 271.1

## 2014-03-30 LAB — FERRITIN: Ferritin (ARMC): 46 ng/mL

## 2014-04-02 ENCOUNTER — Ambulatory Visit
Admit: 2014-04-02 | Disposition: A | Discharge status: Home / Self Care | Payer: Self-pay | Attending: Internal Medicine | Admitting: Internal Medicine

## 2014-04-24 NOTE — Op Note (Signed)
PATIENT NAME:  Kelsey Le, Kelsey Le MR#:  [id number] DATE OF BIRTH:  05/01/1930  DATE OF PROCEDURE:  01/06/2013  PREOPERATIVE DIAGNOSIS: T10 compression fracture.   POSTOPERATIVE DIAGNOSIS: T10 compression fracture.  PROCEDURE: T10 biopsy and kyphoplasty.   ANESTHESIA: MAC.   SURGEON: Kennedy BuckerMichael Klyn Kroening, M.D.   DESCRIPTION OF PROCEDURE: The patient was brought to the Operating Room and after adequate sedation was given, the patient was placed prone, and Le-arm was brought in to get good visualization of the vertebral T10 body, checking both AP and lateral projections. After a timeout procedure was completed, local anesthetic was infiltrated in the area of the planned incisions. The back was then prepped and draped in the usual sterile manner, repeat timeout procedure completed. Local anesthetic was infiltrated down to the pedicle on the left and right sides using a spinal needle with 0.5%, 50/50 mix of Sensorcaine with epinephrine and Xylocaine. A small stab incision was made on the right side and the trocar advanced in a transpedicular approach. A biopsy was obtained. The bone appeared somewhat soft. Drilling was carried out across the midline and with the balloon inflation, there appeared to be good elevation, and partial reduction of the deformity, and with the balloon going well past the midline, it was felt only a single-sided stick would be required.  Cement was mixed and 3 mL of cement was infiltrated. This appeared to give good fill with good interdigitation and trocar was removed with permanent Le-arm views obtained. The wound was closed with Dermabond, followed by a Band-Aid. The patient was sent to the recovery room in stable condition.   ESTIMATED BLOOD LOSS: Minimal.   COMPLICATIONS: None.   SPECIMEN: T10 vertebral body biopsy.     ____________________________ Leitha SchullerMichael J. Azani Brogdon, MD mjm:cc D: 01/06/2013 18:52:29 ET T: 01/06/2013 19:04:03 ET JOB#: [id number]  cc: Leitha SchullerMichael J. Georgia Baria, MD,  <Dictator> Leitha SchullerMICHAEL J Jassica Zazueta MD ELECTRONICALLY SIGNED 01/07/2013 8:22

## 2014-05-02 NOTE — Consult Note (Signed)
PATIENT NAME:  Kelsey Le, Kelsey Le MR#:  [id number] DATE OF BIRTH:  1930-09-26  DATE OF CONSULTATION:  01/05/2014  REFERRING PHYSICIAN:  Dow AdolphMatthew Rein, MD CONSULTING PHYSICIAN:  Kelsey Leathers R. Sherrlyn HockPandit, MD  REASON FOR CONSULTATION: Severe iron deficiency anemia; unable to tolerate oral iron.   HISTORY OF PRESENT ILLNESS: The patient is an 81 year old female with past medical history significant for history of asthma, hypertension not on medication, chronic impaired hearing and chronic anemia, along with multiple surgeries as listed below. She has been admitted to the hospital with progressive generalized weakness, dyspnea on exertion for the past few months, but symptoms increased quickly for 1-2 days prior to admission. She also has been having intermittent dark stools for the past 3-4 months. She reportedly has had GI work-[place] in [place]. The patient's daughter was also present at bedside, and they state that colonoscopy probably showed some polyps, EGD was mostly unremarkable, and she also reportedly had a capsule study, which was reportedly unremarkable.   Upon admission, hemoglobin was down to 5.5 and hematocrit of 18.4. She has received packed red blood cell transfusion and is clinically feeling better. Workup so far indicates significant iron deficiency, given ferritin of 7, iron saturation of 6%, and serum iron of 21. The patient states that she is unable to tolerate even over-the-counter low-dose iron preparation, since 1 dose causes severe GI upset and nausea and vomiting. Hematology has been consulted for evaluation of anemia and parenteral iron therapy.   PAST MEDICAL HISTORY: As in HPI above.  PAST SURGICAL HISTORY: Hemorrhoidectomy, appendectomy, bilateral tubal ligation, cataract surgery, bladder surgery, left hip surgery, back surgery with kyphoplasty in January 2015.   FAMILY HISTORY: Remarkable for heart disease. Denies hematological disorders or malignancy.   SOCIAL HISTORY: Denies  smoking or alcohol usage. The patient [place]between [place] and [place], a few months at a time. Her husband reportedly lives in [place]. She is physically active and ambulatory.   HOME MEDICATIONS: Fexofenadine nasal spray as needed.   ALLERGIES: No known drug allergies.   REVIEW OF SYSTEMS:  CONSTITUTIONAL: As in HPI. No fevers or chills. No night sweats.  HEENT: Denies headaches, dizziness, epistaxis, ear or jaw pain. No sinus symptoms.  CARDIAC: Currently denies angina, palpitation, orthopnea, or PND.  LUNGS: Has mild dyspnea on exertion. No new cough, sputum, hemoptysis, or chest pain.  GASTROINTESTINAL: As in HPI.  GENITOURINARY: No dysuria or hematuria.  SKIN: No new rashes or pruritus.  HEMATOLOGIC: As in HPI. No other bleeding issues, except for intermittent dark stools.  EXTREMITIES: No new swelling or pain.  MUSCULOSKELETAL: Denies new pain, redness or swelling in joints.  No new bone pain.  NEUROLOGIC: No new focal weakness, seizures, or loss of consciousness.  ENDOCRINE: No polyuria or polydipsia. Denies unintentional weight loss.   PHYSICAL EXAMINATION:  GENERAL: The patient is a moderately built, thin individual. Alert and oriented, and converses appropriately. In no acute distress. No tachypnea. No icterus. Mild pallor.  VITAL SIGNS: Temperature 98.1, heart rate 63, respiratory rate 18, blood pressure 124/52, oxygen saturation 97% on room air.  HEENT: Normocephalic, atraumatic. Extraocular movements intact. Sclerae anicteric. No oral thrush.  NECK: Negative for lymphadenopathy.  CARDIOVASCULAR: S1, S2. Regular rate and rhythm.  LUNGS: Show bilateral good air entry; no crepitations or rhonchi noted.  ABDOMEN: Soft, nontender; no hepatosplenomegaly or masses palpable clinically.  EXTREMITIES: No major edema or cyanosis.  SKIN: No generalized rashes or major bruising.  LYMPHATICS: Negative for inguinal adenopathy.  NEUROLOGIC: Cranial nerves intact. Grossly  nonfocal.   LABORATORY RESULTS: On 01/04/2014: Serum iron 21, iron saturation 6%, ferritin of 7. PTT 38.8, INR 1.1. Creatinine 0.94, calcium 8.0. LFTs unremarkable, except albumin low at 3.2. Hemoglobin 5.5, WBC 3600, platelets 183,000, MCV 89. Stool Hemoccult in ED reportedly positive; but lab stool Hemoccult was negative on 01/05/2014. On 01/05/2014, hemoglobin better at 9.1, WBC normal at 5600 with unremarkable differential, platelets 178,000.   IMPRESSION AND RECOMMENDATIONS: An 81 year old female patient with a history of medical problems as described above, more recently has had persistent anemia and iron deficiency, along with intermittent dark stools, admitted with progressive anemia causing symptoms, and has required blood transfusion support. She reportedly had recent GI evaluation, including capsule endoscopy, EGD and colonoscopy at [facility]  a few months ago, which did not reveal any obvious source of GI bleeding. She has been seen by Dr. Shelle Iron here, and is being planned for EGD. Labs so far show significant iron deficiency anemia. As stated above, the patient is intolerant of oral iron. Agree that she would definitely benefit from parenteral iron therapy.   I have discussed the rationale, benefits and possible side effects of Venofer treatment, and she is agreeable to this. We will give first dose of Venofer 500 mg IV today over 4 hours. Serum B12 level is unremarkable. Lab stool Hemoccult is negative. Will get further anemia workup, including folate, direct Coombs test, serum erythropoietin, and serum and random urine protein immunoelectrophoresis to rule out plasma cell dyscrasia. If patient is discharged soon, will follow up as outpatient and plan second dose of Venofer and then ill need continued monitoring and plan further parenteral iron therapy. I have also explained that if remaining anemia work-up returns abnormal, we will need to intervene as indicated. The patient is agreeable to  this plan.   Thank you for the referral. Please feel free to contact me for additional questions.    ____________________________ Maren Reamer Sherrlyn Hock, MD srp:MT D: 01/06/2014 08:39:00 ET T: 01/06/2014 09:09:36 ET JOB#: [id number]  cc: Eliel Dudding R. Sherrlyn Hock, MD, <Dictator> Wille Celeste MD ELECTRONICALLY SIGNED 01/06/2014 11:13

## 2014-05-02 NOTE — H&P (Signed)
PATIENT NAME:  Kelsey Le, Kelsey Le MR#:  [id number] DATE OF BIRTH:  03-01-1930  DATE OF ADMISSION:  01/04/2014  REFERRING PHYSICIAN:  Jene Every, MD   PRIMARY CARE PHYSICIAN: Nonlocal.  GASTROENTEROLOGIST:  Cathie Hoops, MD from [place].   CHIEF COMPLAINT:  1.  Generalized weakness, shortness of breath, ongoing for the past few months but with increased symptoms for the past 1 day.  2.  Passing of dark stools for the past 3 to 4 months.   HISTORY OF PRESENT ILLNESS: An 81 year old Caucasian female, a pleasant lady, with history of chronic iron deficiency anemia status post complete GI workup recently, history of asthma which is in remission, and history of borderline hypertension, presents to the Emergency Room with the complaints of worsening of generalized weakness and shortness of breath of 1 day duration. The patient stated that she has a history of chronic anemia and of late she has been having positive passing of black stools for the past 3 to 4 months for which she consulted her primary care practitioner and she had seen a gastroenterologist in [place] who did a complete work-up including upper endoscopy, colonoscopy and capsule endoscopy and as per the patient on these studies were negative, and presumably these studies were performed in the last 2 to 3 months.  The patient resides in [place] and moves in between [place] and [place] and in the process of moving to [place] in the next few months.   Denies any abdominal pain. No nausea. No vomiting and no history of chronic GERD symptoms.  No history of any chronic nonsteroidal anti-inflammatory medications.  Denies any chest pain, no dizziness, no palpitations.  No fever, no cough. No urinary symptoms such as frequency, urgency, or dysuria.  The patient states she had these symptoms a few months ago, about 3 months ago, for which she was admitted to a local hospital in [place] and following that admission, she had a  complete GI workup which as per the patient were unremarkable.  In the Emergency Room, the patient was evaluated by the ED physician and was found to have severe anemia with hemoglobin of 5.5 and hematocrit of 18.4, but her vital signs were stable.  Type and crossmatch and 2 units of packed red blood cells were ordered and currently her first unit of blood transfusion is in progress.   PAST MEDICAL HISTORY:  1.  History of asthma, status post immunotherapy and currently in remission.  2.  History of chronic anemia status post a complete GI workup done in the last 2 to 3 months.  3.  History of hypertension, which is borderline and the patient does not take any medications at this time.  4.  History of chronic impaired hearing and uses hearing aids.   PAST SURGICAL HISTORY:  1.  Left hip surgery.  2.  Bladder surgery.  3.  Status post hemorrhoidectomy.  4.  Appendectomy.  5.  Bilateral tubal ligation.  6.  Cataract surgery.  7.  Back surgery with kyphoplasty done in January 2015.   HOME MEDICATIONS: Fexofenadine on and off, nasal spray on and off.   ALLERGIES: No known drug allergies.   FAMILY HISTORY: Significant for heart problems in the family members. No history of any cancers.   SOCIAL HISTORY: She is married and she lives in between a few months in [place] and a few months in [place].  Her husband lives in [place].  No history of any smoking, alcohol, or drug usage.    REVIEW  OF SYSTEMS:  CONSTITUTIONAL: Negative for fever, positive for generalized weakness and fatigue, ongoing for the past 3 to 4 months. No excessive weight gain or weight loss.  EYES: Negative for blurred vision, double vision. No pain. No redness. No inflammation.  EARS, NOSE, AND THROAT: Negative for tinnitus or ear pain. History of hearing loss for which she uses bilateral hearing aids.  RESPIRATORY: Negative for cough, wheezing, hemoptysis, dyspnea. She does have shortness of breath on exertion.  History of asthma, for which she underwent immunotherapy and currently in remission,  CARDIOVASCULAR: Negative for chest pain. No orthopnea. No pedal edema. No palpitations. No dizziness or syncope.  GASTROINTESTINAL: Negative for nausea, vomiting, diarrhea, abdominal pain. No hematemesis. Does have ongoing melena for the past 3 to 4 months.  GENITOURINARY: Negative for dysuria, hematuria, frequency, or urgency.  ENDOCRINE: Negative for polyuria or nocturia. No heat or cold intolerance.  HEMATOLOGIC: Positive for chronic  anemia. Negative for easy bruising. She does have some melena ongoing for the past few months.  MUSCULOSKELETAL: Negative for arthritis, joint pain or swelling.   NEUROLOGICAL: Negative for focal weakness or numbness. No history of CVA, transient ischemic attack, or seizure disorder.  PSYCHIATRIC: Negative for anxiety, insomnia, or depression.   PHYSICAL EXAMINATION:  VITAL SIGNS: Temperature 98.2 degrees Fahrenheit, pulse rate 92 per minute, respirations 18 per minute, blood pressure 135/55, oxygen saturation 100% on room air.  GENERAL: Well-nourished, pleasant and cooperative, alert, awake, and oriented, comfortably lying in the bed in no acute distress, pleasant.  HEAD: Atraumatic, normocephalic.  EYES: Pupils are equal and reactive to light. Conjunctival pallor present. No scleral icterus. Extraocular movements intact.  NOSE: No nasal lesions. No drainage.  EARS: No drainage. No external lesions.  MOUTH:  No mucosal lesions. No exudates.  NECK: Supple. No JVD. No thyromegaly. No carotid bruit. Range of motion of neck normal.  RESPIRATORY: Good respiratory effort. Not using accessory muscles of respiration. Bilateral vesicular breath sounds present. No rales or rhonchi.  GASTROINTESTINAL: Abdomen is soft and nontender. No hepatosplenomegaly. Bowel sounds present and equal in all 4 quadrants. Stool guaiac-positive reported by the ED physician. No guarding noted, no rigidity,  no ecchymosis.  GENITOURINARY: Deferred.  MUSCULOSKELETAL: Range of motion is normal.  EXTREMITIES: No tenderness. No effusion. Strength and tone equal bilaterally.  SKIN: Inspection within normal limits.   LYMPHATIC: Negative for cervical lymphadenopathy.  VASCULAR: Good dorsalis pedis and posterior tibial pulses.  NEUROLOGICAL: Alert, awake, and oriented x 3. Cranial nerves II through XII grossly intact. Deep tendon reflexes 2+ bilateral symmetrical.  Motor strength is 5/5 in both upper and lower extremities.  PSYCHIATRIC: Judgment and insight are adequate. Alert and oriented x 3. Memory and mood within normal limits.   LABORATORY DATA: Serum glucose 88, BUN 22, creatinine 0.94, sodium 144 potassium 3.7, chloride 112, bicarbonate 25, total calcium 8.0 and total protein 6.3, albumin 3.2, total bilirubin 0.4, alkaline phosphatase 57, AST 28, ALT 19.  Troponin less than 0.02. WBC is 3.6, hemoglobin 5.5, hematocrit 18.5, platelet count 183,000. ProTime 13.6.   Chest x-ray: Chronic obstructive pulmonary disease changes. No acute cardiovascular changes.   EKG:  Normal sinus rhythm with ventricular rate of 92 beats per minute, LVH present. No acute ST-T changes.   ASSESSMENT AND PLAN: An 81 year old Caucasian female with a past medical history significant for chronic iron deficiency anemia presents to the Emergency Room with the complaints of ongoing shortness of breath with generalized weakness and melena for the past 3 to 4 months  with worsening of the symptoms in the past 24 hours.  Evaluation in the Emergency Room revealed critical hemoglobin level of 5.5 over 18.4. The patient is hemodynamically stable.  Type and crossmatch was done and the patient is in the process of getting packed red blood cells transfusions.  1.  Acute on chronic anemia secondary due to chronic ongoing lower gastrointestinal blood loss. The patient has been having the symptoms for the past 3 to 4 months and recent admission in  [place] for which she has seen her gastroenterologist locally and according to the patient, the patient had a complete gastrointestinal workup including EGD, colonoscopy, and capsule endoscopy and as per the patient all these studies were reported negative.  Etiology of lower GI bleed is not known.  The patient is stable hemodynamically at this time. Plan to admit to telemetry. Close monitoring of vital signs and type and crossmatch for 2 units of packed red blood cells transfusions and repeat CBC post transfusion and monitor CBC and check iron level, ferritin, B12 level and consider further work-up including a gastrointestinal and possible hematology evaluation.   2.  Lower gastrointestinal bleeding ongoing for the past 3 to 4 months. The patient under care of gastroenterologist Dr. Cathie Hoopsichard Jones in [place]. As per the patient, the patient had a complete gastrointestinal workup in the last 2 to 3 months which included an EGD colonoscopy and a capsule study, which were reported negative to the patient.  Report is not available at this time.  Plan is to consult GI for input and continue proton pump inhibitors.  3.  History of asthma, status post immunotherapy in the past with resolution of asthma symptoms. No symptoms at present. Monitor.  4.  Deep vein thrombosis prophylaxis with sequential compression stockings. No heparin because of acute lower gastrointestinal bleed.  5.  Gastrointestinal prophylaxis with proton pump inhibitors.   CODE STATUS: FULL CODE.   TIME SPENT: 45 minutes.      ____________________________ Crissie FiguresEdavally N. Rashawn Rayman, MD enr:DT D: 01/04/2014 13:18:52 ET T: 01/04/2014 14:02:57 ET JOB#: [id number]  cc: Crissie FiguresEdavally N. Angle Karel, MD, <Dictator> Donette LarryJones, [place], MD [facility]([place]) Unknown PCP Crissie FiguresEDAVALLY N Kimie Pidcock MD ELECTRONICALLY SIGNED 01/05/2014 23:52

## 2014-05-02 NOTE — Consult Note (Signed)
Brief Consult Note: Diagnosis: severe IDA, melena.   Patient was seen by consultant.   Consult note dictated.   Recommend to proceed with surgery or procedure.   Comments: Recs:  1.) obtain EGD / colon / [facility]le [facility] Yetta BarreJones 2.) will plan for push enteroscopy 01/06/13  3.) CTE or MRE if push enteroscopy negative.  4.) Hematology consult.  Electronic Signatures for Addendum Section:  Dow Adolphein, Kyarra Vancamp (MD) (Signed Addendum 05-Jan-16 09:05)  will consult hematology since she cannot tolerate PO iron and is already s/p EGD/colon/capsule.   Electronic Signatures: Dow Adolphein, Mitch Arquette (MD)  (Signed 05-Jan-16 09:01)  Authored: Brief Consult Note   Last Updated: 05-Jan-16 09:05 by Dow Adolphein, Clancy Leiner (MD)

## 2014-05-02 NOTE — Consult Note (Signed)
PATIENT NAME:  Kelsey Le, Kelsey Le MR#:  [id number] DATE OF BIRTH:  1930/02/29  DRayna SextonE OF CONSULTATION:  01/05/2014  REFERRING PHYSICIAN:  Jonnie KindEdavally Reddy, MD CONSULTING PHYSICIAN:  Dow AdolphMatthew Rein, MD  REASON FOR CONSULTATION: Severe iron deficiency anemia, possible melena.  HISTORY OF PRESENT ILLNESS: Kelsey Le is an 81 year old female with a past medical history notable for asthma, chronic anemia and hypertension who is presenting to the Emergency Room for evaluation of weakness and shortness of breath. Of note, Ms. Haas has a long-standing history of iron deficiency anemia. However, it did become worse over the past several months. In the setting of this, she did see her gastroenterologist, Dr. Ander PurpuraJohn Jones in [place]. She reports undergoing an EGD, colonoscopy and a capsule endoscopy without a source for blood loss.   Currently, she was found to have a hemoglobin of 5.5 on presentation. She does report that she sees some intermittent very dark brown stools. However, when she has these stools she does not believe that her stool frequency actually increases. She does have trouble with constipation. She has tried taking p.o. iron in the past, but is unable to tolerate it due to side effects. She also reports that she is unable to tolerate any red meat. In terms of iron, she does try and eat chicken and green, leafy vegetables.   She denies any trouble with nausea, vomiting, weight loss, red blood in stools, dysphagia, GERD. She does not take any NSAIDs.   PAST MEDICAL HISTORY: 1.  Asthma.  2.  Chronic anemia.  3.  Hypertension.  4.  Chronic hearing loss.   PAST SURGICAL HISTORY:  1.  Hemorrhoidectomy.  2.  Appendectomy.  3.  Tubal ligation.  4.  Back surgery with kyphoplasty.  HOME MEDICATIONS: None.  ALLERGIES: NKDA.   FAMILY HISTORY: She is not aware of any family history of colon, stomach, esophageal or other GI malignancy.   SOCIAL HISTORY: She is a nonsmoker and does not drink  alcohol.   REVIEW OF SYSTEMS: A 10 system review was conducted and is negative except as stated in the HPI.  PHYSICAL EXAMINATION: VITAL SIGNS: Temperature 97.5, pulse 60, respirations 18, blood pressure 131/58, pulse ox 98% on room air. GENERAL: Alert and oriented times 4.  No acute distress. Appears stated age. HEENT: Normocephalic/atraumatic. Extraocular movements are intact. Anicteric. NECK: Soft, supple. JVP appears normal. No adenopathy. CHEST: Clear to auscultation. No wheeze or crackle. Respirations unlabored. HEART: Regular. No murmur, rub, or gallop.  Normal S1 and S2. ABDOMEN: Soft, nontender, nondistended.  Normal active bowel sounds in all 4 quadrants.  No organomegaly. No masses EXTREMITIES: No swelling, well perfused. SKIN: No rash or lesion. Skin color, texture, turgor normal. NEUROLOGICAL: Grossly intact. PSYCHIATRIC: Normal tone and affect. MUSCULOSKELETAL: No joint swelling or erythema.   DIAGNOSTIC DATA: Sodium 144, potassium 3.7, BUN 22, creatinine 0.94. The iron is low at 21. Iron sat is low at 6.  Ferritin is low at 7. Her liver enzymes are normal, except for an albumin of 3.2. Troponin is negative. Her hemoglobin on presentation was 5.5, which is now 9.1, platelets 178 and MCV  87. INR 1.1.   She did have a chest x-ray on presentation, which was suggestive of COPD.  ASSESSMENT: Severe iron deficiency anemia, possible melena: She has had an extensive gastrointestinal work-up in the past several months including EGD, capsule and colonoscopy. We are in the process of obtaining these records. In addition, she does report seeing some intermittent dark stools that are darker  than her normal brown stools. It is not entirely clear if this does represent gastrointestinal blood loss or not, but given her overall presentation with a hemoglobin of 5.5 this would be concerning for an upper gastrointestinal or small bowel source of blood loss.   RECOMMENDATIONS: 1.  We will obtain  her recent EGD, colon and capsule reports from Dr. Yetta Barre with [facility]. 2.  Given the severe anemia and the dark stools cited, we will plan for a push enteroscopy to evaluate for a lesion such as Dieulafoy lesion that could have been missed on her previous endoscopy work-up.  3.  We will obtain a hematology consult given that she cannot tolerate p.o. iron and given that she has already had the full gastrointestinal work-up, although we will continue to pursue a gastrointestinal source of blood loss.  4.  If her push enteroscopy is negative, we will consider a CT enterography or MR enterography to further evaluate for an occult source of gastrointestinal blood loss.   5.  N.p.o. after midnight.  6.  Further recommendations pending above findings.   Thank you for this consultation.   ____________________________ Dow Adolph, MD mr:sb D: 01/05/2014 09:20:37 ET T: 01/05/2014 10:30:03 ET JOB#: [id number]  cc: Dow Adolph, MD, <Dictator> Kathalene Frames MD ELECTRONICALLY SIGNED 01/13/2014 16:34

## 2014-05-02 NOTE — Discharge Summary (Signed)
PATIENT NAME:  Kelsey Le, Kelsey Le MR#:  [id number] DATE OF BIRTH:  [birth year]  For a detailed note, please look at the history and physical done on admission by Dr. Betti Cruzeddy.   DIAGNOSES AT DISCHARGE: As follows: 1.  Severe iron deficient anemia, suspected gastrointestinal bleed, but now ruled out. 2.  Weakness and shortness of breath due to the anemia.   DIET: The patient is being discharged on a regular diet.   ACTIVITY: As tolerated.   FOLLOWUP: With her primary care physician in the next 1-2 weeks.   DISCHARGE MEDICATIONS: Afrin nasal spray b.i.d. as needed, Tylenol 650 q. 4 hours as needed.   CONSULTANTS DURING THE HOSPITAL COURSE: Sandeep R. Sherrlyn HockPandit, MD, from  hematology/oncology and also Dow AdolphMatthew Rein, MD, from gastroenterology.   PERTINENT STUDIES DONE DURING THE HOSPITAL COURSE: As follows: A chest x-ray done on admission showing COPD with no evidence of acute pneumonia or CHF.  HOSPITAL COURSE: This is an 81 year old female who presented to the hospital with shortness of breath and weakness and noted to be severely anemic with a hemoglobin of 5.5.   Problem #1.  Iron deficient anemia. The exact cause of the anemia is unclear presently. It was suspected to be a GI source, but she is Hemoccult negative. She had a recent GI workup done in [place], which was normal. She also underwent an upper GI endoscopy while in the hospital here, which was also negative. The patient has been seen by hematology/oncology with Dr. Sherrlyn HockPandit and started on IV iron infusions, also started autoimmune hemolytic anemia workup for this, the results of which are still pending. The patient has been transfused 2 units of packed red blood cells and her hemoglobin has improved and remained stable with no evidence of acute bleeding. She is being discharged home with close followup with Dr. Sherrlyn HockPandit as an outpatient.  Problem #2.  Weakness and shortness of breath. This was secondary to the anemia. The patient has been  transfused and her hemoglobin has improved and her weakness and shortness of breath have improved.  Problem #3.  Hypertension. The patient remained normotensive. She apparently is currently not taking antihypertensives. She was apparently on losartan, which I resumed, but as per the family and the patient, she is currently not taking it as her blood pressure has been stable. She did have some labile blood pressures here, but she says that was related to her being worked up and being in the hospital.  Problem #4.  Suspected GI bleed. This was thought to be the cause of her severe iron deficient anemia, but this has been ruled out with a negative GI workup in [place] and also negative endoscopy in the hospital and a negative Hemoccult.   CODE STATUS: The patient is a full code.   DISPOSITION: She is being discharged home.   TIME SPENT: 40 minutes.    ____________________________ Rolly PancakeVivek J. Cherlynn KaiserSainani, MD vjs:LT D: 01/06/2014 15:13:34 ET T: 01/06/2014 21:06:57 ET JOB#: [id number]  cc: Rolly PancakeVivek J. Cherlynn KaiserSainani, MD, <Dictator> Houston SirenVIVEK J Anquinette Pierro MD ELECTRONICALLY SIGNED 01/26/2014 10:37

## 2014-06-08 ENCOUNTER — Other Ambulatory Visit: Payer: Self-pay | Admitting: Internal Medicine

## 2014-06-08 DIAGNOSIS — R1310 Dysphagia, unspecified: Secondary | ICD-10-CM

## 2014-06-18 ENCOUNTER — Ambulatory Visit
Admission: RE | Admit: 2014-06-18 | Discharge: 2014-06-18 | Disposition: A | Discharge status: Home / Self Care | Payer: Medicare Other | Source: Ambulatory Visit | Attending: Internal Medicine | Admitting: Internal Medicine

## 2014-06-18 DIAGNOSIS — R1310 Dysphagia, unspecified: Secondary | ICD-10-CM | POA: Insufficient documentation

## 2014-07-08 ENCOUNTER — Ambulatory Visit: Payer: Medicare Other | Attending: Orthopedic Surgery | Admitting: Occupational Therapy

## 2014-07-08 DIAGNOSIS — M79641 Pain in right hand: Secondary | ICD-10-CM | POA: Insufficient documentation

## 2014-07-08 NOTE — Therapy (Signed)
[facility]ICINE [address] [place], [address] Phone: [phone]   Fax:  [phone]  Occupational Therapy Treatment  Patient Details  Name: Kelsey Le MRN: [id number] Date of Birth: 04-Aug-1930 Referring Provider:  Kennedy Bucker, MD  Encounter Date: 07/08/2014      OT End of Session - 07/08/14 1934    Visit Number 1   Number of Visits 1   Date for OT Re-Evaluation 07/08/14   OT Start Time 1033   OT Stop Time 1128   OT Time Calculation (min) 55 min   Activity Tolerance Patient tolerated treatment well;Patient limited by pain   Behavior During Therapy [facility] for tasks assessed/performed      Past Medical History  Diagnosis Date  . Fractured hip   . Broken wrist   . Syncope and collapse   . Heart murmur   . Arrhythmia   . Asthma   . Broken back   . Broken wrist     Past Surgical History  Procedure Laterality Date  . Hip fracture surgery    . Tubal ligation    . Partial hysterectomy    . Back surgery      back fusion    There were no vitals filed for this visit.  Visit Diagnosis:  Pain of right hand      Subjective Assessment - 07/08/14 1911    Subjective  Larey Seat about 3 months ago and was in splint and cast - but thumbs still hurts    Patient is accompained by: Family member   Repetition Increases Symptoms   Patient Stated Goals See if therapy can help - and how to use my hand   Pain Score 4    Pain Location Hand   Pain Orientation Right   Pain Descriptors / Indicators Stabbing   Pain Type Chronic pain   Pain Onset More than a month ago   Pain Frequency Intermittent   Aggravating Factors  Using my thumb            [facility] OT Assessment - 07/08/14 0001    Assessment   Diagnosis R EPL rupture and wrist fx   Assessment Pt present with injury about 3 months ago R wrist fracture but that resulted also in EPL rupture - still cont to have difficulty using thumb and pain when using it     Home  Environment   Lives With Alone   Prior Function   Leisure Watch tv, walk dog, some reading and puzzles   AROM   Right Wrist Extension 68 Degrees   Right Wrist Flexion 62 Degrees   Left Wrist Extension 73 Degrees   Left Wrist Flexion 70 Degrees   Strength   Right Hand Grip (lbs) 25   Right Hand Lateral Pinch 12 lbs   Right Hand 3 Point Pinch 9 lbs   Left Hand Grip (lbs) 25   Left Hand Lateral Pinch 10 lbs   Left Hand 3 Point Pinch 10 lbs                  OT Treatments/Exercises (OP) - 07/08/14 0001    ADLs   ADL Comments AE education done and handout provided for pt and children- builtup handles for utencils and toothbrush provided                OT Education - 07/08/14 1934    Education provided Yes   Education Details Adaptive equipment  Person(s) Educated Patient;Child(ren)   Methods Explanation;Demonstration;Verbal cues;Handout   Comprehension Verbalized understanding;Returned demonstration;Verbal cues required             OT Long Term Goals - 07/08/14 1941    OT LONG TERM GOAL #1   Title Pt and family verbalize 3 adaptive equipments to increase ease using hands and decrease pain    Time 1   Status Achieved               Plan - 07/08/14 1936    Clinical Impression Statement Pt present about 3 months post R wrist fx and EPL rupture - pt wrist AROM and strength appear WFL - grip and prehension even compare to the L except 3 point - pt do show decrease thumb extention at IP and MP - and increase pain - pt and children was educated on Adaptive equipment to decrease pain and  increase ease of using R dominant hand in ADL's- discuss with pt and children that  no therapy  indicated or wiill help for EPL rupture and she  show great ROM and strength for wrist fx -  did educate on AE to decrease pain  - no further OT services indicated at this time    Pt will benefit from skilled therapeutic intervention in order to improve on the following  deficits (Retired) Decreased knowledge of use of DME   Rehab Potential Poor   OT Frequency One time visit   OT Treatment/Interventions Self-care/ADL training;DME and/or AE instruction   Plan Eval only and education    OT Home Exercise Plan see pt instruction   Consulted and Agree with Plan of Care Patient;Family member/caregiver          G-Codes - 07/08/14 1942    Functional Assessment Tool Used ROM ,grip strength, clinical judgement   Self Care Current Status (N8295(G8987) At least 1 percent but less than 20 percent impaired, limited or restricted   Self Care Goal Status (A2130(G8988) At least 1 percent but less than 20 percent impaired, limited or restricted      Problem List Patient Active Problem List   Diagnosis Date Noted  . GI bleed 03/01/2014  . Aortic stenosis 07/27/2013  . Heart murmur 04/26/2013  . SOB (shortness of breath) 04/26/2013  . Elevated blood pressure reading without diagnosis of hypertension 04/26/2013    Oletta CohnuPreez, Ellene Bloodsaw OTR/L, CLT 07/08/2014, 7:45 PM  [facility] PH[facility]TS [facility]ICINE [address] [place], [place], [id number] Phone: [phone]   Fax:  [phone]

## 2014-07-08 NOTE — Patient Instructions (Signed)
Educated on enlarging handles  eletric toothbrush [employer] for [employer] Key adaptor Penagain  jaropener Spring loaded scissors

## 2014-07-12 ENCOUNTER — Ambulatory Visit: Payer: Medicare Other | Admitting: Occupational Therapy

## 2014-07-22 ENCOUNTER — Other Ambulatory Visit: Payer: Self-pay | Admitting: Cardiovascular Disease

## 2014-07-22 DIAGNOSIS — I350 Nonrheumatic aortic (valve) stenosis: Secondary | ICD-10-CM

## 2014-07-22 DIAGNOSIS — R011 Cardiac murmur, unspecified: Secondary | ICD-10-CM

## 2014-07-29 ENCOUNTER — Other Ambulatory Visit: Payer: Self-pay

## 2014-07-29 ENCOUNTER — Ambulatory Visit (INDEPENDENT_AMBULATORY_CARE_PROVIDER_SITE_OTHER): Payer: Medicare Other

## 2014-07-29 DIAGNOSIS — I350 Nonrheumatic aortic (valve) stenosis: Secondary | ICD-10-CM

## 2014-07-29 DIAGNOSIS — R011 Cardiac murmur, unspecified: Secondary | ICD-10-CM | POA: Diagnosis not present

## 2014-07-30 ENCOUNTER — Ambulatory Visit: Payer: Medicare Other

## 2014-07-30 ENCOUNTER — Ambulatory Visit: Payer: Medicare Other | Admitting: Internal Medicine

## 2014-07-30 ENCOUNTER — Other Ambulatory Visit: Payer: Medicare Other

## 2014-08-09 ENCOUNTER — Emergency Department: Payer: Medicare Other

## 2014-08-09 ENCOUNTER — Emergency Department
Admission: EM | Admit: 2014-08-09 | Discharge: 2014-08-09 | Disposition: A | Discharge status: Home / Self Care | Payer: Medicare Other | Attending: Emergency Medicine | Admitting: Emergency Medicine

## 2014-08-09 ENCOUNTER — Encounter: Payer: Self-pay | Admitting: Emergency Medicine

## 2014-08-09 DIAGNOSIS — M4856XA Collapsed vertebra, not elsewhere classified, lumbar region, initial encounter for fracture: Secondary | ICD-10-CM

## 2014-08-09 DIAGNOSIS — Z880 Allergy status to penicillin: Secondary | ICD-10-CM | POA: Insufficient documentation

## 2014-08-09 DIAGNOSIS — M8468XA Pathological fracture in other disease, other site, initial encounter for fracture: Secondary | ICD-10-CM | POA: Diagnosis not present

## 2014-08-09 DIAGNOSIS — M545 Low back pain: Secondary | ICD-10-CM | POA: Diagnosis present

## 2014-08-09 MED ORDER — ACETAMINOPHEN 325 MG PO TABS
650.0000 mg | ORAL_TABLET | Freq: Once | ORAL | Status: AC
  Administered 2014-08-09: 650 mg via ORAL
  Filled 2014-08-09: qty 2

## 2014-08-09 NOTE — ED Notes (Signed)
Pt presents with low back pain this am while moving a rug by herself. Pt states she heard something "pop". Pt ambulated to stat desk. No acute distress noted.

## 2014-08-09 NOTE — ED Provider Notes (Signed)
[facility] Emergency Department Provider Note  ____________________________________________  Time seen: Approximately 9:59 AM  I have reviewed the triage vital signs and the nursing notes.   HISTORY  Chief Complaint Back Pain   HPI Kelsey Le is a 81 y.o. female is here complaining of low back pain. She states she is moving around this morning by herself when she heard a "pop". Since that time she has had nonradiating pain to the left side of her back. Patient was able to ambulate at home but pain has increased since that time. She denies any paresthesias to her lower extremities. She is not taking any medication prior to her arrival to the emergency room. She denies any urinary symptoms. Currently her pain is 8 out of 10. She has had some back injuries in the past with the last one being when she was retrieving something underneath her bed.Range of motion increases her pain and also taking deep breaths.   Past Medical History  Diagnosis Date  . Fractured hip   . Broken wrist   . Syncope and collapse   . Heart murmur   . Arrhythmia   . Asthma   . Broken back   . Broken wrist     Patient Active Problem List   Diagnosis Date Noted  . GI bleed 03/01/2014  . Aortic stenosis 07/27/2013  . Heart murmur 04/26/2013  . SOB (shortness of breath) 04/26/2013  . Elevated blood pressure reading without diagnosis of hypertension 04/26/2013    Past Surgical History  Procedure Laterality Date  . Hip fracture surgery    . Tubal ligation    . Partial hysterectomy    . Back surgery      back fusion    Current Outpatient Rx  Name  Route  Sig  Dispense  Refill  . Docusate Calcium (STOOL SOFTENER PO)   Oral   Take by mouth as needed.         Marland Kitchen HYDROCODONE-ACETAMINOPHEN PO   Oral   Take by mouth as needed.           Allergies Penicillins  Family History  Problem Relation Age of Onset  . Heart disease Sister     Social History History   Substance Use Topics  . Smoking status: Never Smoker   . Smokeless tobacco: Not on file  . Alcohol Use: No    Review of Systems Constitutional: No fever/chills Eyes: No visual changes. Cardiovascular: Denies chest pain. Respiratory: Denies shortness of breath. Gastrointestinal: No abdominal pain.  No nausea, no vomiting.  No diarrhea.  No constipation. Genitourinary: Negative for dysuria. Musculoskeletal: Negative for back pain. Skin: Negative for rash. Neurological: Negative for headaches, focal weakness or numbness.  10-point ROS otherwise negative.  ____________________________________________   PHYSICAL EXAM:  VITAL SIGNS: ED Triage Vitals  Enc Vitals Group     BP 08/09/14 0846 165/64 mmHg     Pulse Rate 08/09/14 0846 68     Resp 08/09/14 0846 20     Temp 08/09/14 0848 97.9 F (36.6 C)     Temp Source 08/09/14 0848 Oral     SpO2 08/09/14 0846 98 %     Weight 08/09/14 0846 116 lb (52.617 kg)     Height 08/09/14 0846  (1.651 m)     Head Cir --      Peak Flow --      Pain Score 08/09/14 0847 8     Pain Loc --  Pain Edu? --      Excl. in GC? --     Constitutional: Alert and oriented. Well appearing and in no acute distress. Eyes: Conjunctivae are normal. PERRL. EOMI. Head: Atraumatic. Nose: No congestion/rhinnorhea. Neck: No stridor.  No cervical tenderness on palpation. Range of motion is not restricted Cardiovascular: Normal rate, regular rhythm. Grossly normal heart sounds.  Good peripheral circulation. Respiratory: Normal respiratory effort.  No retractions. Lungs CTAB. Gastrointestinal: Soft and nontender. No distention. No abdominal bruits. No CVA tenderness. Musculoskeletal: Examination of the back deformity. There is tenderness on palpation of the left lumbar spine and paravertebral muscles. Range of motion is restricted secondary to pain. No active muscle spasms were seen at this time. No lower extremity tenderness nor edema.  No joint  effusions. Neurologic:  Normal speech and language. No gross focal neurologic deficits are appreciated. No gait instability. Skin:  Skin is warm, dry and intact. No rash noted. Psychiatric: Mood and affect are normal. Speech and behavior are normal.  ____________________________________________   LABS (all labs ordered are listed, but only abnormal results are displayed)  Labs Reviewed - No data to display RADIOLOGY  Lumbar spine x-ray per radiologist are reviewed by me shows new compression fracture deformity superior endplate of L2. There is an old compression fracture of L1 I, Tommi Rumps, personally viewed and evaluated these images as part of my medical decision making.  ____________________________________________   PROCEDURES  Procedure(s) performed: None  Critical Care performed: No  ____________________________________________   INITIAL IMPRESSION / ASSESSMENT AND PLAN / ED COURSE  Pertinent labs & imaging results that were available during my care of the patient were reviewed by me and considered in my medical decision making (see chart for details)   Patient is already had  one compression fracture and was seen by Dr. Rosita Kea.  She also has a "pain doctor"for her hip. She has had multiple reactions to all pain medication and the only thing she feels couple with his Tylenol. We discussed using Tylenol along with ice packs to her back. She is to call and make an appointment with orthopedist today.   FINAL CLINICAL IMPRESSION(S) / ED DIAGNOSES  Final diagnoses:  Compression fracture of lumbar spine, non-traumatic, initial encounter      Tommi Rumps, PA-C 08/09/14 1211  Sharyn Creamer, MD 08/09/14 [phone]

## 2014-08-09 NOTE — Discharge Instructions (Signed)
TAKE TYLNEOL AS DIRECTED ICE TO BACK FOR PAIN CONTROL CALL FOR AN APPOINTMENT WITH DR. MENZ

## 2014-08-09 NOTE — ED Notes (Signed)
States she felt a pop in lower back this am after lifting a rug.. States pain is non radiating but increases with movement

## 2014-08-12 ENCOUNTER — Other Ambulatory Visit: Payer: Self-pay | Admitting: Orthopedic Surgery

## 2014-08-12 ENCOUNTER — Other Ambulatory Visit: Payer: Self-pay | Admitting: *Deleted

## 2014-08-12 DIAGNOSIS — S32010A Wedge compression fracture of first lumbar vertebra, initial encounter for closed fracture: Secondary | ICD-10-CM

## 2014-08-12 DIAGNOSIS — D509 Iron deficiency anemia, unspecified: Secondary | ICD-10-CM

## 2014-08-13 ENCOUNTER — Inpatient Hospital Stay: Payer: Medicare Other

## 2014-08-13 ENCOUNTER — Ambulatory Visit
Admission: RE | Admit: 2014-08-13 | Discharge: 2014-08-13 | Disposition: A | Discharge status: Home / Self Care | Payer: Medicare Other | Source: Ambulatory Visit | Attending: Orthopedic Surgery | Admitting: Orthopedic Surgery

## 2014-08-13 ENCOUNTER — Ambulatory Visit: Payer: Medicare Other | Admitting: Internal Medicine

## 2014-08-13 ENCOUNTER — Ambulatory Visit: Payer: Medicare Other

## 2014-08-13 ENCOUNTER — Other Ambulatory Visit: Payer: Medicare Other

## 2014-08-13 ENCOUNTER — Inpatient Hospital Stay: Payer: Medicare Other | Attending: Internal Medicine | Admitting: Internal Medicine

## 2014-08-13 VITALS — BP 149/59 | HR 70 | Temp 97.3°F | Resp 18 | Ht 65.0 in | Wt 117.7 lb

## 2014-08-13 DIAGNOSIS — H9190 Unspecified hearing loss, unspecified ear: Secondary | ICD-10-CM | POA: Diagnosis not present

## 2014-08-13 DIAGNOSIS — M4856XA Collapsed vertebra, not elsewhere classified, lumbar region, initial encounter for fracture: Secondary | ICD-10-CM | POA: Diagnosis not present

## 2014-08-13 DIAGNOSIS — Z8781 Personal history of (healed) traumatic fracture: Secondary | ICD-10-CM | POA: Diagnosis not present

## 2014-08-13 DIAGNOSIS — R0602 Shortness of breath: Secondary | ICD-10-CM

## 2014-08-13 DIAGNOSIS — J45909 Unspecified asthma, uncomplicated: Secondary | ICD-10-CM | POA: Diagnosis not present

## 2014-08-13 DIAGNOSIS — I10 Essential (primary) hypertension: Secondary | ICD-10-CM | POA: Insufficient documentation

## 2014-08-13 DIAGNOSIS — D509 Iron deficiency anemia, unspecified: Secondary | ICD-10-CM | POA: Diagnosis not present

## 2014-08-13 DIAGNOSIS — S32010A Wedge compression fracture of first lumbar vertebra, initial encounter for closed fracture: Secondary | ICD-10-CM

## 2014-08-13 DIAGNOSIS — M5137 Other intervertebral disc degeneration, lumbosacral region: Secondary | ICD-10-CM | POA: Diagnosis not present

## 2014-08-13 DIAGNOSIS — M545 Low back pain: Secondary | ICD-10-CM | POA: Diagnosis present

## 2014-08-13 DIAGNOSIS — R011 Cardiac murmur, unspecified: Secondary | ICD-10-CM | POA: Insufficient documentation

## 2014-08-13 DIAGNOSIS — I499 Cardiac arrhythmia, unspecified: Secondary | ICD-10-CM | POA: Insufficient documentation

## 2014-08-13 LAB — CBC WITH DIFFERENTIAL/PLATELET
Basophils Absolute: 0.1 K/uL (ref 0–0.1)
Basophils Relative: 3 %
Eosinophils Absolute: 0.1 K/uL (ref 0–0.7)
Eosinophils Relative: 2 %
HCT: 34.1 % — ABNORMAL LOW (ref 35.0–47.0)
Hemoglobin: 11.4 g/dL — ABNORMAL LOW (ref 12.0–16.0)
Lymphocytes Relative: 10 %
Lymphs Abs: 0.5 K/uL — ABNORMAL LOW (ref 1.0–3.6)
MCH: 31.6 pg (ref 26.0–34.0)
MCHC: 33.3 g/dL (ref 32.0–36.0)
MCV: 95.1 fL (ref 80.0–100.0)
Monocytes Absolute: 0.2 K/uL (ref 0.2–0.9)
Monocytes Relative: 4 %
Neutro Abs: 4.1 K/uL (ref 1.4–6.5)
Neutrophils Relative %: 81 %
Platelets: 208 K/uL (ref 150–440)
RBC: 3.59 MIL/uL — ABNORMAL LOW (ref 3.80–5.20)
RDW: 13.6 % (ref 11.5–14.5)
WBC: 5.1 K/uL (ref 3.6–11.0)

## 2014-08-13 LAB — IRON AND TIBC
Iron: 49 ug/dL (ref 28–170)
Saturation Ratios: 17 % (ref 10.4–31.8)
TIBC: 288 ug/dL (ref 250–450)
UIBC: 239 ug/dL

## 2014-08-13 LAB — FERRITIN: Ferritin: 166 ng/mL (ref 11–307)

## 2014-08-13 NOTE — Progress Notes (Signed)
   08/13/14 1330  Clinical Encounter Type  Visited With Patient  Visit Type Initial  Provided pastoral presence and assistance to patient and friend/family member in the cancer center. [employer] Taneal Sonntag-pager [phone]

## 2014-08-13 NOTE — Progress Notes (Signed)
Patient is here for follow-up of IDA and IV Venofer treatment. Patient's energy level has no been very good. She states that does walk her dog a few times a day. Patient states she hurt her back a while back and it still bothers her. She states that if she is sitting the pain is ok, but it is when she gets up and down that it bothers her.

## 2014-08-19 ENCOUNTER — Ambulatory Visit: Payer: Medicare Other | Admitting: Anesthesiology

## 2014-08-19 ENCOUNTER — Ambulatory Visit
Admission: RE | Admit: 2014-08-19 | Discharge: 2014-08-19 | Disposition: A | Discharge status: Home / Self Care | Payer: Medicare Other | Source: Ambulatory Visit | Attending: Orthopedic Surgery | Admitting: Orthopedic Surgery

## 2014-08-19 ENCOUNTER — Encounter
Admission: RE | Disposition: A | Discharge status: Home / Self Care | Payer: Self-pay | Source: Ambulatory Visit | Attending: Orthopedic Surgery

## 2014-08-19 ENCOUNTER — Ambulatory Visit: Payer: Medicare Other

## 2014-08-19 ENCOUNTER — Encounter: Payer: Self-pay | Admitting: *Deleted

## 2014-08-19 DIAGNOSIS — Z9842 Cataract extraction status, left eye: Secondary | ICD-10-CM | POA: Insufficient documentation

## 2014-08-19 DIAGNOSIS — Z91018 Allergy to other foods: Secondary | ICD-10-CM | POA: Diagnosis not present

## 2014-08-19 DIAGNOSIS — Z79899 Other long term (current) drug therapy: Secondary | ICD-10-CM | POA: Insufficient documentation

## 2014-08-19 DIAGNOSIS — Y9389 Activity, other specified: Secondary | ICD-10-CM | POA: Diagnosis not present

## 2014-08-19 DIAGNOSIS — S32020A Wedge compression fracture of second lumbar vertebra, initial encounter for closed fracture: Secondary | ICD-10-CM | POA: Diagnosis present

## 2014-08-19 DIAGNOSIS — Z90710 Acquired absence of both cervix and uterus: Secondary | ICD-10-CM | POA: Diagnosis not present

## 2014-08-19 DIAGNOSIS — Z888 Allergy status to other drugs, medicaments and biological substances status: Secondary | ICD-10-CM | POA: Diagnosis not present

## 2014-08-19 DIAGNOSIS — Z8249 Family history of ischemic heart disease and other diseases of the circulatory system: Secondary | ICD-10-CM | POA: Insufficient documentation

## 2014-08-19 DIAGNOSIS — Z9841 Cataract extraction status, right eye: Secondary | ICD-10-CM | POA: Insufficient documentation

## 2014-08-19 DIAGNOSIS — R000 Tachycardia, unspecified: Secondary | ICD-10-CM | POA: Insufficient documentation

## 2014-08-19 DIAGNOSIS — Z880 Allergy status to penicillin: Secondary | ICD-10-CM | POA: Diagnosis not present

## 2014-08-19 DIAGNOSIS — S32029A Unspecified fracture of second lumbar vertebra, initial encounter for closed fracture: Secondary | ICD-10-CM | POA: Insufficient documentation

## 2014-08-19 DIAGNOSIS — Z419 Encounter for procedure for purposes other than remedying health state, unspecified: Secondary | ICD-10-CM

## 2014-08-19 HISTORY — PX: KYPHOPLASTY: SHX5884

## 2014-08-19 SURGERY — KYPHOPLASTY
Anesthesia: Monitor Anesthesia Care

## 2014-08-19 MED ORDER — LIDOCAINE HCL 1 % IJ SOLN
INTRAMUSCULAR | Status: DC | PRN
  Administered 2014-08-19: 20 mL

## 2014-08-19 MED ORDER — ACETAMINOPHEN 10 MG/ML IV SOLN
INTRAVENOUS | Status: AC
  Filled 2014-08-19: qty 100

## 2014-08-19 MED ORDER — CLINDAMYCIN PHOSPHATE 900 MG/50ML IV SOLN
INTRAVENOUS | Status: AC
  Administered 2014-08-19: 900 mg via INTRAVENOUS
  Filled 2014-08-19: qty 50

## 2014-08-19 MED ORDER — BUPIVACAINE-EPINEPHRINE (PF) 0.5% -1:200000 IJ SOLN
INTRAMUSCULAR | Status: AC
  Filled 2014-08-19: qty 30

## 2014-08-19 MED ORDER — METOCLOPRAMIDE HCL 5 MG/ML IJ SOLN: 5.0000 mg | Freq: Three times a day (TID) | INTRAMUSCULAR | Status: DC | PRN

## 2014-08-19 MED ORDER — HYDROCODONE-ACETAMINOPHEN 5-325 MG PO TABS
1.0000 | ORAL_TABLET | ORAL | Status: DC | PRN
  Administered 2014-08-19: 1 via ORAL

## 2014-08-19 MED ORDER — LACTATED RINGERS IV SOLN
INTRAVENOUS | Status: DC
  Administered 2014-08-19: 11:00:00 via INTRAVENOUS

## 2014-08-19 MED ORDER — LIDOCAINE HCL (PF) 1 % IJ SOLN
INTRAMUSCULAR | Status: AC
  Filled 2014-08-19: qty 60

## 2014-08-19 MED ORDER — PROPOFOL 10 MG/ML IV BOLUS
INTRAVENOUS | Status: DC | PRN
  Administered 2014-08-19: 10 mg via INTRAVENOUS
  Administered 2014-08-19 (×2): 30 mg via INTRAVENOUS

## 2014-08-19 MED ORDER — METOCLOPRAMIDE HCL 10 MG PO TABS: 5.0000 mg | ORAL_TABLET | Freq: Three times a day (TID) | ORAL | Status: DC | PRN

## 2014-08-19 MED ORDER — ACETAMINOPHEN 10 MG/ML IV SOLN
INTRAVENOUS | Status: DC | PRN
  Administered 2014-08-19: 1000 mg via INTRAVENOUS

## 2014-08-19 MED ORDER — ONDANSETRON HCL 4 MG PO TABS: 4.0000 mg | ORAL_TABLET | Freq: Four times a day (QID) | ORAL | Status: DC | PRN

## 2014-08-19 MED ORDER — IOHEXOL 300 MG/ML  SOLN
INTRAMUSCULAR | Status: DC | PRN
  Administered 2014-08-19: 50 mL via INTRAVENOUS

## 2014-08-19 MED ORDER — HYDROCODONE-ACETAMINOPHEN 5-325 MG PO TABS: 1.0000 | ORAL_TABLET | Freq: Four times a day (QID) | ORAL | Status: DC | PRN

## 2014-08-19 MED ORDER — PROPOFOL INFUSION 10 MG/ML OPTIME
INTRAVENOUS | Status: DC | PRN
  Administered 2014-08-19: 100 ug/kg/min via INTRAVENOUS

## 2014-08-19 MED ORDER — KETAMINE HCL 50 MG/ML IJ SOLN
INTRAMUSCULAR | Status: DC | PRN
  Administered 2014-08-19: 20 mg via INTRAMUSCULAR

## 2014-08-19 MED ORDER — LIDOCAINE HCL (CARDIAC) 20 MG/ML IV SOLN
INTRAVENOUS | Status: DC | PRN
  Administered 2014-08-19: 50 mg via INTRAVENOUS

## 2014-08-19 MED ORDER — FENTANYL CITRATE (PF) 100 MCG/2ML IJ SOLN: 25.0000 ug | INTRAMUSCULAR | Status: DC | PRN

## 2014-08-19 MED ORDER — BUPIVACAINE-EPINEPHRINE (PF) 0.5% -1:200000 IJ SOLN
INTRAMUSCULAR | Status: DC | PRN
  Administered 2014-08-19: 10 mL via PERINEURAL

## 2014-08-19 MED ORDER — ONDANSETRON HCL 4 MG/2ML IJ SOLN: 4.0000 mg | Freq: Once | INTRAMUSCULAR | Status: DC | PRN

## 2014-08-19 MED ORDER — CLINDAMYCIN PHOSPHATE 900 MG/50ML IV SOLN: 900.0000 mg | Freq: Once | INTRAVENOUS | Status: DC

## 2014-08-19 MED ORDER — ONDANSETRON HCL 4 MG/2ML IJ SOLN: 4.0000 mg | Freq: Four times a day (QID) | INTRAMUSCULAR | Status: DC | PRN

## 2014-08-19 MED ORDER — HYDROCODONE-ACETAMINOPHEN 5-325 MG PO TABS
ORAL_TABLET | ORAL | Status: AC
  Filled 2014-08-19: qty 1

## 2014-08-19 SURGICAL SUPPLY — 15 items
CEMENT BONE KYPHON CDS (Cement) ×3 IMPLANT
DEVICE BIOPSY BONE KYPHX (INSTRUMENTS) ×3 IMPLANT
DRAPE C-ARM XRAY 36X54 (DRAPES) ×3 IMPLANT
DURAPREP 26ML APPLICATOR (WOUND CARE) ×3 IMPLANT
GLOVE SURG CUT RESIS 520032000 (GLOVE) ×3 IMPLANT
GLOVE SURG ORTHO 9.0 STRL STRW (GLOVE) ×3 IMPLANT
GOWN SPECIALTY ULTRA XL (MISCELLANEOUS) ×3 IMPLANT
GOWN STRL REUS W/ TWL LRG LVL3 (GOWN DISPOSABLE) ×1 IMPLANT
GOWN STRL REUS W/TWL LRG LVL3 (GOWN DISPOSABLE) ×2
LIQUID BAND (GAUZE/BANDAGES/DRESSINGS) ×3 IMPLANT
NDL SAFETY 18GX1.5 (NEEDLE) ×3 IMPLANT
PACK KYPHOPLASTY (MISCELLANEOUS) ×3 IMPLANT
STRAP SAFETY BODY (MISCELLANEOUS) ×3 IMPLANT
TRAY KYPHOPAK 15/3 EXPRESS 1ST (MISCELLANEOUS) IMPLANT
TRAY KYPHOPAK 20/3 EXPRESS 1ST (MISCELLANEOUS) ×3 IMPLANT

## 2014-08-19 NOTE — Transfer of Care (Signed)
Immediate Anesthesia Transfer of Care Note  Patient: Kelsey Le  Procedure(s) Performed: Procedure(s): KYPHOPLASTY (N/A)  Patient Location: PACU  Anesthesia Type:MAC  Level of Consciousness: awake, alert  and oriented  Airway & Oxygen Therapy: Patient Spontanous Breathing and Patient connected to nasal cannula oxygen  Post-op Assessment: Report given to RN and Post -op Vital signs reviewed and stable  Post vital signs: Reviewed and stable  Last Vitals:  Filed Vitals:   08/19/14 1338  BP: 146/73  Pulse:   Temp: 36.6 C  Resp: 17  Pulse 74  Complications: No apparent anesthesia complications

## 2014-08-19 NOTE — Anesthesia Postprocedure Evaluation (Signed)
  Anesthesia Post-op Note  Patient: Kelsey Le  Procedure(s) Performed: Procedure(s): KYPHOPLASTY (N/A)  Anesthesia type:MAC  Patient location: PACU  Post pain: Pain level controlled  Post assessment: Post-op Vital signs reviewed, Patient's Cardiovascular Status Stable, Respiratory Function Stable, Patent Airway and No signs of Nausea or vomiting  Post vital signs: Reviewed and stable  Last Vitals:  Filed Vitals:   08/19/14 1447  BP: 185/93  Pulse: 64  Temp: 36.5 C  Resp: 16    Level of consciousness: awake, alert  and patient cooperative  Complications: No apparent anesthesia complications

## 2014-08-19 NOTE — Anesthesia Preprocedure Evaluation (Addendum)
Anesthesia Evaluation  Patient identified by MRN, date of birth, ID band Patient awake    Reviewed: Allergy & Precautions, NPO status , Patient's Chart, lab work & pertinent test results  Airway Mallampati: II       Dental  (+) Caps   Pulmonary shortness of breath and with exertion, asthma ,  breath sounds clear to auscultation  Pulmonary exam normal       Cardiovascular Normal cardiovascular exam+ dysrhythmias + Valvular Problems/Murmurs AS     Neuro/Psych negative neurological ROS  negative psych ROS   GI/Hepatic negative GI ROS, Neg liver ROS,   Endo/Other  negative endocrine ROS  Renal/GU negative Renal ROS  negative genitourinary   Musculoskeletal negative musculoskeletal ROS (+)   Abdominal Normal abdominal exam  (+)   Peds negative pediatric ROS (+)  Hematology negative hematology ROS (+)   Anesthesia Other Findings   Reproductive/Obstetrics                            Anesthesia Physical Anesthesia Plan  ASA: III  Anesthesia Plan: MAC   Post-op Pain Management:    Induction: Intravenous  Airway Management Planned: Nasal Cannula  Additional Equipment:   Intra-op Plan:   Post-operative Plan:   Informed Consent:   Dental advisory given  Plan Discussed with: CRNA and Surgeon  Anesthesia Plan Comments:         Anesthesia Quick Evaluation

## 2014-08-19 NOTE — Op Note (Signed)
08/19/2014  1:27 PM  PATIENT:  Kelsey Le  82 y.o. female  PRE-OPERATIVE DIAGNOSIS:  L2 COMPRESSION FRACTURE  POST-OPERATIVE DIAGNOSIS:  L2 compression fracture  PROCEDURE:  Procedure(s): KYPHOPLASTY (N/A)  SURGEON: Leitha Schuller, MD  ASSISTANTS: None  ANESTHESIA:   MAC  EBL:  Total I/O In: 200 [I.V.:200] Out: -   BLOOD ADMINISTERED:none  DRAINS: none   LOCAL MEDICATIONS USED:  MARCAINE    and LIDOCAINE   SPECIMEN:  Source of Specimen:  L2 vertebral body  DISPOSITION OF SPECIMEN:  PATHOLOGY  COUNTS:  YES  TOURNIQUET:  * No tourniquets in log *  IMPLANTS: Bone cement  DICTATION: .DICTATION: .Dragon Dictation patient brought to the operating room and after adequate sedation was obtained she is placed prone. AP and lateral imaging of the affected vertebral body L2 was obtained. Timeout procedure completed and 10 cc of 1% Xylocaine infiltrated subcutaneously for initial local. The back was then prepped and draped in sterile fashion and repeat timeout procedure carried out. Spinal needle was then advanced onto the pedicle on the right side and a mixture of half percent Marcaine and lidocaine with epinephrine was infiltrated down to the bone. After allowing this to set small incision were made trochar advanced in a extrapedicular approach with biopsy obtained of the L2 vertebral body drilling carried out and then inflation of the balloon on  the right side with elevation of the superior endplate of L2 but most of the height was restored with the inflation. Cement was mixed and then inserted with approximate 5 cc filling the vertebral body without extravasation. After this was completed and the cemented set trochar were removed. Wound closed with Dermabond and then a Band-Aid  PLAN OF CARE: Discharge to home after PACU  PATIENT DISPOSITION:  PACU - hemodynamically stable.

## 2014-08-19 NOTE — Discharge Instructions (Addendum)
Remove Band-Aid on Saturday morning and okay to leave this off. Okay to shower after Band-Aid removal. Activities as tolerated starting tomorrow AMBULATORY SURGERY  DISCHARGE INSTRUCTIONS   1) The drugs that you were given will stay in your system until tomorrow so for the next 24 hours you should not:  A) Drive an automobile B) Make any legal decisions C) Drink any alcoholic beverage   2) You may resume regular meals tomorrow.  Today it is better to start with liquids and gradually work up to solid foods.  You may eat anything you prefer, but it is better to start with liquids, then soup and crackers, and gradually work up to solid foods.   3) Please notify your doctor immediately if you have any unusual bleeding, trouble breathing, redness and pain at the surgery site, drainage, fever, or pain not relieved by medication.    4) Additional Instructions:   Please contact your physician with any problems or Same Day Surgery at [phone], Monday through Friday 6 am to 4 pm, or [place] at [facility] number at [phone].

## 2014-08-19 NOTE — H&P (Signed)
Reviewed paper H+P, will be scanned into chart. No changes noted.  

## 2014-08-19 NOTE — OR Nursing (Signed)
Patient reports having difficulty with many antibiotics but not sure of which ones. Kelsey Le will discuss with Dr. Rosita Kea to see if he still wants patient to receive the clindamycin.

## 2014-08-20 ENCOUNTER — Encounter: Payer: Self-pay | Admitting: Orthopedic Surgery

## 2014-08-20 LAB — SURGICAL PATHOLOGY

## 2014-08-28 NOTE — Progress Notes (Signed)
[facility]  [phone] [phone]     ID: Kelsey Le OB: July 03, 1930  MR#: [id number]  ION#:[id number]  Patient Care Team: Marguarite Arbour, MD as PCP - General (Internal Medicine)  CHIEF COMPLAINT/DIAGNOSIS:  Anemia of iron deficiency, patient intolerant of oral iron (cannot take even over-the-counter low-dose iron preparation due to severe GI upset), presented during recent hospitalization January 2016 with hemoglobin of 5.5/hematocrit 18.4 and received blood transfusion support. Also started parenteral iron therapy on January 05, 2014, with Venofer 500 mg IV x1.   HISTORY OF PRESENT ILLNESS:  patient returns for hematology followup for anemia as above. States that she is doing steady, has chronic dyspnea on exertion. Denies fatigability. She is eating fairly well. Denies any unintentional weight loss. No obvious bright red blood in stools or melena at this time. No other bleeding symptoms. No new bone pains.    REVIEW OF SYSTEMS:   ROS As in HPI above. In addition, no fevers or sweats. No new headaches or focal weakness.  No new abdominal pain, constipation, diarrhea, dysuria or hematuria. No new skin rash or bleeding symptoms. No new paresthesias in extremities.    PAST MEDICAL HISTORY: Reviewed. Past Medical History  Diagnosis Date  . Fractured hip   . Broken wrist   . Syncope and collapse   . Heart murmur   . Arrhythmia   . Asthma   . Broken back   . Broken wrist           Asthma  Hypertension not on medication  Chronic impaired hearing  Anemia  Reportedly in 2015 at [facility] she had colonoscopy probably showed some polyps, EGD was mostly unremarkable, and capsule study which was reportedly unremarkable.   Hemorrhoidectomy  Appendectomy  Bilateral tubal ligation  Cataract surgery  Bladder surgery  Left hip surgery  Back surgery with kyphoplasty in January 2015.   PAST SURGICAL HISTORY: Reviewed. Past Surgical History    Procedure Laterality Date  . Hip fracture surgery    . Tubal ligation    . Partial hysterectomy    . Back surgery      back fusion  . Kyphoplasty N/A 08/19/2014    Procedure: KYPHOPLASTY;  Surgeon: Kennedy Bucker, MD;  Location: [facility] ORS;  Service: Orthopedics;  Laterality: N/A;    FAMILY HISTORY: Reviewed. Family History  Problem Relation Age of Onset  . Heart disease Sister     SOCIAL HISTORY: Reviewed. Social History  Substance Use Topics  . Smoking status: Never Smoker   . Smokeless tobacco: Not on file  . Alcohol Use: No    Allergies  Allergen Reactions  . Penicillins     antibiotics    Current Outpatient Prescriptions  Medication Sig Dispense Refill  . acetaminophen (TYLENOL) 500 MG tablet Take by mouth.    [name] Calcium (STOOL SOFTENER PO) Take by mouth as needed.    Marland Kitchen HYDROcodone-acetaminophen (NORCO) 5-325 MG per tablet Take 1 tablet by mouth every 6 (six) hours as needed for moderate pain. 30 tablet 0  . HYDROCODONE-ACETAMINOPHEN PO Take by mouth as needed.     No current facility-administered medications for this visit.    PHYSICAL EXAM: Filed Vitals:   08/13/14 1349  BP: 149/59  Pulse: 70  Temp: 97.3 F (36.3 C)  Resp: 18     Body mass index is 19.59 kg/(m^2).       GENERAL: Patient is alert and oriented and in no acute distress. There is no icterus  or pallor. HEENT: EOMs intact. Oral exam negative for thrush or lesions. No cervical lymphadenopathy. CVS: S1S2, regular LUNGS: Bilaterally clear to auscultation, no rhonchi. ABDOMEN: Soft, nontender. No hepatosplenomegaly clinically.  EXTREMITIES: No pedal edema.  LAB RESULTS: Hb 11.4, serum Fe 49, iron sat 17%, ferritin 166.  Lab Results  Component Value Date   WBC 5.1 08/13/2014   NEUTROABS 4.1 08/13/2014   HGB 11.4* 08/13/2014   HCT 34.1* 08/13/2014   MCV 95.1 08/13/2014   PLT 208 08/13/2014   01/05/14 - ferritin 7, serum Fe 21, iron sat 6%, TIBC 360, SIEP with M-spike 0.2 g/dL,  random-UIEP negative for M-spike, serum EPO 38.4. Serum B12, folate, direct Coombs test unremarkable.  ASSESSMENT / PLAN:   Iron-deficiency anemia, history of intermittent dark stools with recent reportedly negative EGD/colonoscopy/capsule endoscopy done at [facility] in 2015. Status post recent hospitalization for hemoglobin down to 5.5 grams she was seen by GI, Dr. Shelle Iron, per patient report he does not plan to do any procedures in the near future. Patient has received 1 g dose of IV Venofer in January 2016   -  reviewed labs from today and d/w patient. Hemoglobin today is doing better at 11.4 g and she is doing better overall. Also, iron study is in normal range. Given this, plan is to continue monitoring and consider parenteral iron therapy in the future if she develops recurrent iron deficiency. Will monitor Hb and iron study q16 weeks, next MD f/u at 48 weeks and make further plan of management. In between visits, patient advised to call or come to ER in case of any new symptoms or acute sickness. She is agreeable to this plan.    Kelsey Banks, MD   08/28/2014 11:01 PM

## 2014-09-03 ENCOUNTER — Encounter: Payer: Self-pay | Admitting: Cardiovascular Disease

## 2014-09-03 ENCOUNTER — Ambulatory Visit (INDEPENDENT_AMBULATORY_CARE_PROVIDER_SITE_OTHER): Payer: Medicare Other | Admitting: Cardiovascular Disease

## 2014-09-03 VITALS — BP 160/60 | HR 66 | Ht 65.0 in | Wt 119.0 lb

## 2014-09-03 DIAGNOSIS — R0602 Shortness of breath: Secondary | ICD-10-CM

## 2014-09-03 DIAGNOSIS — R030 Elevated blood-pressure reading, without diagnosis of hypertension: Secondary | ICD-10-CM

## 2014-09-03 DIAGNOSIS — I350 Nonrheumatic aortic (valve) stenosis: Secondary | ICD-10-CM | POA: Diagnosis not present

## 2014-09-03 DIAGNOSIS — R011 Cardiac murmur, unspecified: Secondary | ICD-10-CM | POA: Diagnosis not present

## 2014-09-03 NOTE — Progress Notes (Signed)
Primary care physician: Dr. Meredeth Ide in [place]. she is going to establish with Dr. Judithann Sheen  HPI  This is a pleasant 81 year old female who is here today for a followup visit regarding moderate aortic stenosis.  Most recent echocardiogram in July 2016 showed normal LV systolic function with moderate aortic stenosis and small posterior pericardial effusion. Aortic valve mean gradient was 31 mmHg with a valve area of 1.03.  A nuclear stress test in May 2015 showed no evidence of ischemia. In December 2015, she had black stool and was found to be severely anemic with a hemoglobin of 7.2. She was hospitalized in [place] and underwent transfusion. According to the patient, no obvious source of bleeding was identified in spite of extensive GI workup. She was diagnosed with severe iron deficiency anemia and was started on IV iron with improvement.  She fell recently and broke her back. She underwent kyphoplasty but continues to have chronic back pain. She reports episodes of dizziness with relatively low blood pressure. No chest pain or shortness of breath.  Allergies  Allergen Reactions  . Penicillins     antibiotics     Current Outpatient Prescriptions on File Prior to Visit  Medication Sig Dispense Refill  . acetaminophen (TYLENOL) 500 MG tablet Take by mouth.     No current facility-administered medications on file prior to visit.     Past Medical History  Diagnosis Date  . Fractured hip   . Broken wrist   . Syncope and collapse   . Heart murmur   . Arrhythmia   . Asthma   . Broken back   . Broken wrist      Past Surgical History  Procedure Laterality Date  . Hip fracture surgery    . Tubal ligation    . Partial hysterectomy    . Back surgery      back fusion  . Kyphoplasty N/A 08/19/2014    Procedure: KYPHOPLASTY;  Surgeon: Kennedy Bucker, MD;  Location: [facility] ORS;  Service: Orthopedics;  Laterality: N/A;     Family History  Problem Relation Age of Onset  .  Heart disease Sister      Social History   Social History  . Marital Status: Married    Spouse Name: N/A  . Number of Children: N/A  . Years of Education: N/A   Occupational History  . Not on file.   Social History Main Topics  . Smoking status: Never Smoker   . Smokeless tobacco: Not on file  . Alcohol Use: No  . Drug Use: No  . Sexual Activity: Not on file   Other Topics Concern  . Not on file   Social History Narrative     ROS A 10 point review of system was performed. It is negative other than that mentioned in the history of present illness.   PHYSICAL EXAM   BP 160/60 mmHg  Pulse 66  Ht  (1.651 m)  Wt 119 lb (53.978 kg)  BMI 19.80 kg/m2 Constitutional: She is oriented to person, place, and time. She appears well-developed and well-nourished. No distress.  HENT: No nasal discharge.  Head: Normocephalic and atraumatic.  Eyes: Pupils are equal and round. No discharge.  Neck: Normal range of motion. Neck supple. No JVD present. No thyromegaly present.  Cardiovascular: Normal rate, regular rhythm, normal heart sounds. Exam reveals no gallop and no friction rub. There is a 3/6 systolic ejection murmur at the left sternal border and aortic area. The murmur is mid peaking  with preserved S2 . Pulmonary/Chest: Effort normal and breath sounds normal. No stridor. No respiratory distress. She has no wheezes. She has no rales. She exhibits no tenderness.  Abdominal: Soft. Bowel sounds are normal. She exhibits no distension. There is no tenderness. There is no rebound and no guarding.  Musculoskeletal: Normal range of motion. She exhibits +1 edema and no tenderness.  Neurological: She is alert and oriented to person, place, and time. Coordination normal.  Skin: Skin is warm and dry. No rash noted. She is not diaphoretic. No erythema. No pallor.  Psychiatric: She has a normal mood and affect. Her behavior is normal. Judgment and thought content normal.     EKG:  Sinus  Rhythm  Voltage criteria for LVH  (S(V1)+R(V6) exceeds 3.50 mV).   -Nonspecific ST depression   +   Nonspecific T-abnormality  -Seen with left ventricular hypertrophy (strain) or digitalis effect.   ABNORMAL     ASSESSMENT AND PLAN

## 2014-09-03 NOTE — Assessment & Plan Note (Signed)
Blood pressure is elevated today but she reports recent episodes of symptomatic hypotension. Thus, I elected not to start her on any antihypertensives medication especially in the setting of aortic stenosis.

## 2014-09-03 NOTE — Assessment & Plan Note (Signed)
She has moderate stenosis with some worsening since last year. Continue to monitor clinically and repeat echocardiogram in one year. Her symptoms of shortness of breath and fatigue improved significantly with treatment of her anemia.

## 2014-09-03 NOTE — Patient Instructions (Signed)
Medication Instructions: Continue same medications.   Labwork: None.   Procedures/Testing: None.   Follow-Up: 6 months with Dr. Khalfani Weideman.   Any Additional Special Instructions Will Be Listed Below (If Applicable).   

## 2014-09-07 ENCOUNTER — Other Ambulatory Visit: Payer: Self-pay | Admitting: Internal Medicine

## 2014-09-07 DIAGNOSIS — Z1231 Encounter for screening mammogram for malignant neoplasm of breast: Secondary | ICD-10-CM

## 2014-09-16 ENCOUNTER — Ambulatory Visit
Admission: RE | Admit: 2014-09-16 | Discharge: 2014-09-16 | Disposition: A | Discharge status: Home / Self Care | Payer: Medicare Other | Source: Ambulatory Visit | Attending: Internal Medicine | Admitting: Internal Medicine

## 2014-09-16 DIAGNOSIS — Z1231 Encounter for screening mammogram for malignant neoplasm of breast: Secondary | ICD-10-CM | POA: Diagnosis present

## 2014-10-07 ENCOUNTER — Telehealth: Payer: Self-pay | Admitting: *Deleted

## 2014-10-07 NOTE — Telephone Encounter (Signed)
S/w pt who states she has gained 3lbs in the last two weeks but states she "eats all the time". Has been taking prednisone which may have contributed to weight gain. States she usually has to go to the bathroom a few times eat night but recently, have only been getting up once to urinate. Does not think her legs are swollen. Has reduced salt intake. Is allergic to "everything" so she has to watch what she eats. Foods will often make her swell. Does not appear to be SOB on the phone.  Advised pt to watch fluid and sodium intake, if feet/legs/ankles swell, elevated several times a day. Advised pt if symptoms worsen and unable to breathe, go directly to ER. Pt verbalized understanding with no further questions.

## 2014-10-07 NOTE — Telephone Encounter (Signed)
Pt c/o swelling: STAT is pt has developed SOB within 24 hours  1. How long have you been experiencing swelling? Two weeks  2. Where is the swelling located? Feet and legs.  Gained 3 lbs in two weeks  3.  Are you currently taking a "fluid pill"? no  4.  Are you currently SOB?  yes  5.  Have you traveled recently? No  Patient drinks a lot of fluid.

## 2014-10-29 ENCOUNTER — Other Ambulatory Visit: Payer: Self-pay | Admitting: Internal Medicine

## 2014-11-02 ENCOUNTER — Other Ambulatory Visit: Payer: Self-pay | Admitting: Internal Medicine

## 2014-11-02 DIAGNOSIS — M5136 Other intervertebral disc degeneration, lumbar region: Secondary | ICD-10-CM | POA: Insufficient documentation

## 2014-11-02 DIAGNOSIS — R413 Other amnesia: Secondary | ICD-10-CM

## 2014-11-02 DIAGNOSIS — R42 Dizziness and giddiness: Secondary | ICD-10-CM

## 2014-11-12 ENCOUNTER — Ambulatory Visit
Admission: RE | Admit: 2014-11-12 | Discharge: 2014-11-12 | Disposition: A | Discharge status: Home / Self Care | Payer: Medicare Other | Source: Ambulatory Visit | Attending: Internal Medicine | Admitting: Internal Medicine

## 2014-11-12 DIAGNOSIS — R42 Dizziness and giddiness: Secondary | ICD-10-CM | POA: Diagnosis present

## 2014-11-12 DIAGNOSIS — R413 Other amnesia: Secondary | ICD-10-CM | POA: Insufficient documentation

## 2014-12-03 ENCOUNTER — Inpatient Hospital Stay: Payer: Medicare Other | Attending: Internal Medicine

## 2014-12-03 DIAGNOSIS — D509 Iron deficiency anemia, unspecified: Secondary | ICD-10-CM | POA: Diagnosis not present

## 2014-12-03 LAB — CBC WITH DIFFERENTIAL/PLATELET
Basophils Absolute: 0 10*3/uL (ref 0–0.1)
Basophils Relative: 0 %
EOS PCT: 0 %
Eosinophils Absolute: 0 10*3/uL (ref 0–0.7)
HEMATOCRIT: 34.1 % — AB (ref 35.0–47.0)
HEMOGLOBIN: 11.3 g/dL — AB (ref 12.0–16.0)
LYMPHS ABS: 0.6 10*3/uL — AB (ref 1.0–3.6)
LYMPHS PCT: 7 %
MCH: 30.2 pg (ref 26.0–34.0)
MCHC: 33.1 g/dL (ref 32.0–36.0)
MCV: 91.2 fL (ref 80.0–100.0)
Monocytes Absolute: 0.4 10*3/uL (ref 0.2–0.9)
Monocytes Relative: 5 %
NEUTROS ABS: 7.2 10*3/uL — AB (ref 1.4–6.5)
NEUTROS PCT: 88 %
Platelets: 231 10*3/uL (ref 150–440)
RBC: 3.74 MIL/uL — AB (ref 3.80–5.20)
RDW: 13.8 % (ref 11.5–14.5)
WBC: 8.2 10*3/uL (ref 3.6–11.0)

## 2014-12-03 LAB — IRON AND TIBC
Iron: 81 ug/dL (ref 28–170)
SATURATION RATIOS: 30 % (ref 10.4–31.8)
TIBC: 267 ug/dL (ref 250–450)
UIBC: 186 ug/dL

## 2014-12-03 LAB — FERRITIN: Ferritin: 97 ng/mL (ref 11–307)

## 2015-03-03 ENCOUNTER — Encounter: Payer: Self-pay | Admitting: Cardiovascular Disease

## 2015-03-03 ENCOUNTER — Ambulatory Visit (INDEPENDENT_AMBULATORY_CARE_PROVIDER_SITE_OTHER): Payer: Medicare Other | Admitting: Cardiovascular Disease

## 2015-03-03 VITALS — BP 138/48 | HR 69 | Ht 65.0 in | Wt 120.0 lb

## 2015-03-03 DIAGNOSIS — R011 Cardiac murmur, unspecified: Secondary | ICD-10-CM | POA: Diagnosis not present

## 2015-03-03 DIAGNOSIS — I359 Nonrheumatic aortic valve disorder, unspecified: Secondary | ICD-10-CM | POA: Diagnosis not present

## 2015-03-03 NOTE — Patient Instructions (Addendum)
Medication Instructions:  Your physician recommends that you continue on your current medications as directed. Please refer to the Current Medication list given to you today.   Labwork: none  Testing/Procedures: Your physician has requested that you have an echocardiogram in six months. Echocardiography is a painless test that uses sound waves to create images of your heart. It provides your doctor with information about the size and shape of your heart and how well your heart's chambers and valves are working. This procedure takes approximately one hour. There are no restrictions for this procedure.    Follow-Up: Your physician wants you to follow-up in: six months with Dr. Arida.  You will receive a reminder letter in the mail two months in advance. If you don't receive a letter, please call our office to schedule the follow-up appointment.   Any Other Special Instructions Will Be Listed Below (If Applicable).     If you need a refill on your cardiac medications before your next appointment, please call your pharmacy.  Echocardiogram An echocardiogram, or echocardiography, uses sound waves (ultrasound) to produce an image of your heart. The echocardiogram is simple, painless, obtained within a short period of time, and offers valuable information to your health care provider. The images from an echocardiogram can provide information such as:  Evidence of coronary artery disease (CAD).  Heart size.  Heart muscle function.  Heart valve function.  Aneurysm detection.  Evidence of a past heart attack.  Fluid buildup around the heart.  Heart muscle thickening.  Assess heart valve function. LET YOUR HEALTH CARE PROVIDER KNOW ABOUT:  Any allergies you have.  All medicines you are taking, including vitamins, herbs, eye drops, creams, and over-the-counter medicines.  Previous problems you or members of your family have had with the use of anesthetics.  Any blood disorders  you have.  Previous surgeries you have had.  Medical conditions you have.  Possibility of pregnancy, if this applies. BEFORE THE PROCEDURE  No special preparation is needed. Eat and drink normally.  PROCEDURE   In order to produce an image of your heart, gel will be applied to your chest and a wand-like tool (transducer) will be moved over your chest. The gel will help transmit the sound waves from the transducer. The sound waves will harmlessly bounce off your heart to allow the heart images to be captured in real-time motion. These images will then be recorded.  You may need an IV to receive a medicine that improves the quality of the pictures. AFTER THE PROCEDURE You may return to your normal schedule including diet, activities, and medicines, unless your health care provider tells you otherwise.   This information is not intended to replace advice given to you by your health care provider. Make sure you discuss any questions you have with your health care provider.   Document Released: 12/16/1999 Document Revised: 01/08/2014 Document Reviewed: 08/25/2012 Elsevier Interactive Patient Education 2016 Elsevier Inc.  

## 2015-03-03 NOTE — Progress Notes (Signed)
Cardiology Office Note   Date:  03/03/2015   ID:  Kelsey Le, DOB March 15, 1930, MRN [id number]  PCP:  Marguarite Arbour, MD  Cardiologist:   Lorine Bears, MD   Chief Complaint  Patient presents with  . other    6 month f/u no complaints. Meds reviewed verbally with pt.      History of Present Illness: Kelsey Le is a 82 y.o. female who presents for  a follow-up visit regarding asymptomatic aortic stenosis. Most recent echocardiogram in July 2016 showed normal LV systolic function with moderate aortic stenosis and small posterior pericardial effusion. Aortic valve mean gradient was 31 mmHg with a valve area of 1.03.  A nuclear stress test in May 2015 showed no evidence of ischemia. In December 2015, she had black stool and was found to be severely anemic with a hemoglobin of 7.2. She was hospitalized in [place] and underwent transfusion. According to the patient, no obvious source of bleeding was identified in spite of extensive GI workup. She was diagnosed with severe iron deficiency anemia and was started on IV iron with improvement.  She has chronic back pain and has underwent kyphoplasty in the past.  She also has known history of anxiety with mild memory problems. She denies chest pain or shortness of breath. She does complain of dizziness when her blood pressure falls below 120 m mercury systolic. She walks her dog every day.    Past Medical History  Diagnosis Date  . Fractured hip (HCC)   . Broken wrist   . Syncope and collapse   . Heart murmur   . Arrhythmia   . Asthma   . Broken back   . Broken wrist     Past Surgical History  Procedure Laterality Date  . Hip fracture surgery    . Tubal ligation    . Partial hysterectomy    . Back surgery      back fusion  . Kyphoplasty N/A 08/19/2014    Procedure: KYPHOPLASTY;  Surgeon: Kennedy Bucker, MD;  Location: [facility] ORS;  Service: Orthopedics;  Laterality: N/A;     Current Outpatient Prescriptions    Medication Sig Dispense Refill  . acetaminophen (TYLENOL) 500 MG tablet Take 500 mg by mouth as needed.      No current facility-administered medications for this visit.    Allergies:   Penicillins    Social History:  The patient  reports that she has never smoked. She does not have any smokeless tobacco history on file. She reports that she does not drink alcohol or use illicit drugs.   Family History:  The patient's family history includes Heart disease in her sister.    ROS:  Please see the history of present illness.   Otherwise, review of systems are positive for none.   All other systems are reviewed and negative.    PHYSICAL EXAM: VS:  BP 138/48 mmHg  Pulse 69  Ht  (1.651 m)  Wt 120 lb (54.432 kg)  BMI 19.97 kg/m2 , BMI Body mass index is 19.97 kg/(m^2). GEN: Well nourished, well developed, in no acute distress HEENT: normal Neck: no JVD, carotid bruits, or masses Cardiac: RRR;  rubs, or gallops,no edema . There is a 3/6 systolic murmur in the aortic area which is mid to late peaking with mildly diminished S2. The murmur radiates to the carotid arteries. Respiratory:  clear to auscultation bilaterally, normal work of breathing GI: soft, nontender, nondistended, + BS MS: no deformity or  atrophy Skin: warm and dry, no rash Neuro:  Strength and sensation are intact Psych: euthymic mood, full affect   EKG:  EKG is ordered today. The ekg ordered today demonstrates  normal sinus rhythm with sinus arrhythmia. Left ventricular hypertrophy with repolarization abnormalities   Recent Labs: 12/03/2014: Hemoglobin 11.3*; Platelets 231    Lipid Panel No results found for: CHOL, TRIG, HDL, CHOLHDL, VLDL, LDLCALC, LDLDIRECT    Wt Readings from Last 3 Encounters:  03/03/15 120 lb (54.432 kg)  09/03/14 119 lb (53.978 kg)  08/13/14 117 lb 11.6 oz (53.4 kg)        ASSESSMENT AND PLAN:  1.  Moderate aortic stenosis: The patient continues to be overall asymptomatic.  She has mild dizziness but no syncope or presyncope. No symptoms suggestive of angina or heart failure. Recommend repeat echocardiogram in 6 months and follow-up at that time.  2. History of GI bleed with unknown source: The patient with no recurrent episodes.  3. Anxiety: Followed by Dr. Judithann Sheen.      Disposition:   FU with me in 6 months  Signed, Lorine Bears, MD  03/03/2015 12:49 PM    [facility]

## 2015-03-25 ENCOUNTER — Inpatient Hospital Stay: Payer: Medicare Other | Attending: Internal Medicine

## 2015-03-25 DIAGNOSIS — D509 Iron deficiency anemia, unspecified: Secondary | ICD-10-CM | POA: Diagnosis not present

## 2015-03-25 LAB — CBC WITH DIFFERENTIAL/PLATELET
BASOS ABS: 0.1 10*3/uL (ref 0–0.1)
BASOS PCT: 1 %
EOS ABS: 0.1 10*3/uL (ref 0–0.7)
EOS PCT: 2 %
HCT: 34 % — ABNORMAL LOW (ref 35.0–47.0)
Hemoglobin: 11.4 g/dL — ABNORMAL LOW (ref 12.0–16.0)
Lymphocytes Relative: 25 %
Lymphs Abs: 1.1 10*3/uL (ref 1.0–3.6)
MCH: 30.9 pg (ref 26.0–34.0)
MCHC: 33.4 g/dL (ref 32.0–36.0)
MCV: 92.6 fL (ref 80.0–100.0)
Monocytes Absolute: 0.3 10*3/uL (ref 0.2–0.9)
Monocytes Relative: 6 %
NEUTROS PCT: 66 %
Neutro Abs: 3 10*3/uL (ref 1.4–6.5)
PLATELETS: 182 10*3/uL (ref 150–440)
RBC: 3.67 MIL/uL — AB (ref 3.80–5.20)
RDW: 14.4 % (ref 11.5–14.5)
WBC: 4.5 10*3/uL (ref 3.6–11.0)

## 2015-03-25 LAB — IRON AND TIBC
IRON: 63 ug/dL (ref 28–170)
SATURATION RATIOS: 22 % (ref 10.4–31.8)
TIBC: 283 ug/dL (ref 250–450)
UIBC: 220 ug/dL

## 2015-03-25 LAB — FERRITIN: FERRITIN: 66 ng/mL (ref 11–307)

## 2015-05-23 ENCOUNTER — Telehealth: Payer: Self-pay | Admitting: Cardiovascular Disease

## 2015-05-23 NOTE — Telephone Encounter (Signed)
S/w pt who reports "lots of dizzy spells" for approximately 2-3 months.  They occur anytime during the day. No particular time. She reports being "allergic to everything from cabbage to wheat" and thinks she may need more allergy testing.  She takes 1t children's allegra which sometimes helps the dizzy spells and feels these are allergy related. She likes SBP 125-135 otherwise, she feels dizzy.  If her BP is high, she drinks water. If it's low, she eats something. This seems to help. She does not take any BP medications. States she got very dizzy Friday evening and got scared. She lives alone but did not call anyone for assistance.  Advised pt to f/u w/PCP and/or allergist for allergy concerns.  She has labs drawn every 3 months d/t Dec 2015 bleeding ulcer and is scheduled for labs in June. Last blood work was stable. Advised pt to continue to monitor s/s and if sx worsen, call 911. Otherwise, she will keep May 25 OV w/Dr. Kirke CorinArida. Pt verbalized understanding and is appreciative of the call.

## 2015-05-23 NOTE — Telephone Encounter (Signed)
Pt calling stating stating when her BP gets lower 125 she starts to get dizzy and states if it gets to high 135 and higher she gets dizzy.  Has had a lot of dizzy spells Gets a headache and usually it goes away after a bit but it is not getting better she states.  This is been going on for a while. But feels likes its not getting better Please advise.   Pt is coming 05/26/15 to see Dr Kirke CorinArida  Pt is on wait list.

## 2015-05-26 ENCOUNTER — Encounter: Payer: Self-pay | Admitting: Cardiovascular Disease

## 2015-05-26 ENCOUNTER — Ambulatory Visit (INDEPENDENT_AMBULATORY_CARE_PROVIDER_SITE_OTHER): Payer: Medicare Other | Admitting: Cardiovascular Disease

## 2015-05-26 VITALS — BP 180/76 | HR 64 | Ht 65.0 in | Wt 121.8 lb

## 2015-05-26 DIAGNOSIS — R011 Cardiac murmur, unspecified: Secondary | ICD-10-CM

## 2015-05-26 DIAGNOSIS — R42 Dizziness and giddiness: Secondary | ICD-10-CM

## 2015-05-26 DIAGNOSIS — R0989 Other specified symptoms and signs involving the circulatory and respiratory systems: Secondary | ICD-10-CM

## 2015-05-26 NOTE — Progress Notes (Signed)
Cardiology Office Note   Date:  05/26/2015   ID:  Kelsey Le, DOB [birth year], MRN [id number]  PCP:  Marguarite ArbourSPARKS,JEFFREY D, MD  Cardiologist:   Lorine BearsMuhammad Arida, MD   Chief Complaint  Patient presents with  . other    pt c/o of dizziness. medications reviewed verbally with pt.       History of Present Illness: Kelsey Le is a 82 y.o. female who presents for  a follow-up visit regarding asymptomatic aortic stenosis. Most recent echocardiogram in July 2016 showed normal LV systolic function with moderate aortic stenosis and small posterior pericardial effusion. Aortic valve mean gradient was 31 mmHg with a valve area of 1.03.  A nuclear stress test in May 2015 showed no evidence of ischemia. In December 2015, she had black stool and was found to be severely anemic with a hemoglobin of 7.2. [place] in [place] and underwent transfusion. According to the patient, no obvious source of bleeding was identified in spite of extensive GI workup. She was diagnosed with severe iron deficiency anemia and was started on IV iron with improvement.  She has chronic back pain and has underwent kyphoplasty in the past.  She also has known history of anxiety with mild memory problems. She denies chest pain or shortness of breath.  Her biggest problem continues to beDizziness described as lightheadedness. She reports that this happens of her blood pressure goes below 120 systolic or if he goes above 140. She is extremely sensitive to medications and has been hesitant to give her any antihypertensive medications. She denies vertigo. She just feels like she is drunk during these episodes.   Past Medical History  Diagnosis Date  . Fractured hip (HCC)   . Broken wrist   . Syncope and collapse   . Heart murmur   . Arrhythmia   . Asthma   . Broken back   . Broken wrist     Past Surgical History  Procedure Laterality Date  . Hip fracture surgery    . Tubal ligation    . Partial  hysterectomy    . Back surgery      back fusion  . Kyphoplasty N/A 08/19/2014    Procedure: KYPHOPLASTY;  Surgeon: Kennedy BuckerMichael Menz, MD;  Location: [facility] ORS;  Service: Orthopedics;  Laterality: N/A;     Current Outpatient Prescriptions  Medication Sig Dispense Refill  . fexofenadine (ALLEGRA) 30 MG tablet Take 30 mg by mouth as needed.    . vitamin B-12 (CYANOCOBALAMIN) 1000 MCG tablet Take 1,000 mcg by mouth daily.    Marland Kitchen. acetaminophen (TYLENOL) 500 MG tablet Take 500 mg by mouth as needed.      No current facility-administered medications for this visit.    Allergies:   Penicillins    Social History:  The patient  reports that she has never smoked. She does not have any smokeless tobacco history on file. She reports that she does not drink alcohol or use illicit drugs.   Family History:  The patient's family history includes Heart disease in her sister.    ROS:  Please see the history of present illness.   Otherwise, review of systems are positive for none.   All other systems are reviewed and negative.    PHYSICAL EXAM: VS:  BP 180/76 mmHg  Pulse 64  Ht 5\' 5"  (1.651 m)  Wt 121 lb 12.8 oz (55.248 kg)  BMI 20.27 kg/m2 , BMI Body mass index is 20.27 kg/(m^2). GEN: Well nourished, well  developed, in no acute distress HEENT: normal Neck: no JVD, or masses. Bilateral carotid bruits versus a radiated aortic murmur Cardiac: RRR;  rubs, or gallops,no edema . There is a 3/6 systolic murmur in the aortic area which is mid to late peaking with mildly diminished S2. The murmur radiates to the carotid arteries. Respiratory:  clear to auscultation bilaterally, normal work of breathing GI: soft, nontender, nondistended, + BS MS: no deformity or atrophy Skin: warm and dry, no rash Neuro:  Strength and sensation are intact Psych: euthymic mood, full affect   EKG:  EKG is ordered today. The ekg ordered today demonstrates  normal sinus rhythm with sinus arrhythmia. Left ventricular hypertrophy  with repolarization abnormalities   Recent Labs: 03/25/2015: Hemoglobin 11.4*; Platelets 182    Lipid Panel No results found for: CHOL, TRIG, HDL, CHOLHDL, VLDL, LDLCALC, LDLDIRECT    Wt Readings from Last 3 Encounters:  05/26/15 121 lb 12.8 oz (55.248 kg)  03/03/15 120 lb (54.432 kg)  09/03/14 119 lb (53.978 kg)        ASSESSMENT AND PLAN:  1. Dizziness: The exact etiology of this is not entirely clear. Aortic stenosis might be contributing but last evaluation showed that the stenosis was moderate. By exam, the aortic stenosis does not appear to be much worse. She has bilateral carotid bruits versus a radiated aortic stenosis murmur and given her symptoms, I ordered carotid Doppler.  2.  Moderate aortic stenosis:  Repeat echocardiogram in 3 months with follow-up with me. Given her mild memory impairment and frail status, I don't think she is a good candidate for surgical aortic valve replacement and might be better suited with TAVR .  3. History of GI bleed with unknown source: The patient with no recurrent episodes.  4. Anxiety: Followed by Dr. Judithann Sheen.   5. Labile hypertension: I'm hesitant to give her any antihypertensive medication given very labile blood pressure in the presence of aortic stenosis.    Disposition:   FU with me in 3 months  Signed,  Lorine Bears, MD  05/26/2015 8:16 AM    [facility]

## 2015-05-26 NOTE — Patient Instructions (Addendum)
Medication Instructions:  Your physician recommends that you continue on your current medications as directed. Please refer to the Current Medication list given to you today.   Labwork: none  Testing/Procedures: Your physician has requested that you have a carotid duplex. This test is an ultrasound of the carotid arteries in your neck. It looks at blood flow through these arteries that supply the brain with blood. Allow one hour for this exam. There are no restrictions or special instructions.  Your physician has requested that you have an echocardiogram. Echocardiography is a painless test that uses sound waves to create images of your heart. It provides your doctor with information about the size and shape of your heart and how well your heart's chambers and valves are working. This procedure takes approximately one hour. There are no restrictions for this procedure.    Follow-Up: Your physician recommends that you schedule a follow-up appointment in: 3 months with Dr. Kirke CorinArida.    Any Other Special Instructions Will Be Listed Below (If Applicable).     If you need a refill on your cardiac medications before your next appointment, please call your pharmacy.  Echocardiogram An echocardiogram, or echocardiography, uses sound waves (ultrasound) to produce an image of your heart. The echocardiogram is simple, painless, obtained within a short period of time, and offers valuable information to your health care provider. The images from an echocardiogram can provide information such as:  Evidence of coronary artery disease (CAD).  Heart size.  Heart muscle function.  Heart valve function.  Aneurysm detection.  Evidence of a past heart attack.  Fluid buildup around the heart.  Heart muscle thickening.  Assess heart valve function. LET [facility] PROVIDER KNOW ABOUT:  Any allergies you have.  All medicines you are taking, including vitamins, herbs, eye drops, creams, and  over-the-counter medicines.  Previous problems you or members of your family have had with the use of anesthetics.  Any blood disorders you have.  Previous surgeries you have had.  Medical conditions you have.  Possibility of pregnancy, if this applies. BEFORE THE PROCEDURE  No special preparation is needed. Eat and drink normally.  PROCEDURE   In order to produce an image of your heart, gel will be applied to your chest and a wand-like tool (transducer) will be moved over your chest. The gel will help transmit the sound waves from the transducer. The sound waves will harmlessly bounce off your heart to allow the heart images to be captured in real-time motion. These images will then be recorded.  You may need an IV to receive a medicine that improves the quality of the pictures. AFTER THE PROCEDURE You may return to your normal schedule including diet, activities, and medicines, unless your health care provider tells you otherwise.   This information is not intended to replace advice given to you by your health care provider. Make sure you discuss any questions you have with your health care provider.   Document Released: 12/16/1999 Document Revised: 01/08/2014 Document Reviewed: 08/25/2012 Elsevier Interactive Patient Education [employer]2016 Elsevier Inc.

## 2015-06-15 ENCOUNTER — Ambulatory Visit: Payer: Medicare Other

## 2015-06-15 DIAGNOSIS — R0989 Other specified symptoms and signs involving the circulatory and respiratory systems: Secondary | ICD-10-CM

## 2015-07-15 ENCOUNTER — Ambulatory Visit: Payer: Medicare Other | Admitting: Oncology

## 2015-07-15 ENCOUNTER — Other Ambulatory Visit: Payer: Medicare Other

## 2015-07-18 ENCOUNTER — Inpatient Hospital Stay: Payer: Medicare Other | Attending: Oncology | Admitting: Oncology

## 2015-07-18 ENCOUNTER — Inpatient Hospital Stay: Payer: Medicare Other

## 2015-07-18 ENCOUNTER — Other Ambulatory Visit: Payer: Medicare Other

## 2015-07-18 ENCOUNTER — Ambulatory Visit: Payer: Medicare Other | Admitting: Oncology

## 2015-07-18 VITALS — BP 162/76 | HR 66 | Temp 98.1°F | Resp 17 | Wt 121.7 lb

## 2015-07-18 DIAGNOSIS — D509 Iron deficiency anemia, unspecified: Secondary | ICD-10-CM

## 2015-07-18 DIAGNOSIS — Z8781 Personal history of (healed) traumatic fracture: Secondary | ICD-10-CM | POA: Diagnosis not present

## 2015-07-18 DIAGNOSIS — J45909 Unspecified asthma, uncomplicated: Secondary | ICD-10-CM | POA: Diagnosis not present

## 2015-07-18 DIAGNOSIS — R011 Cardiac murmur, unspecified: Secondary | ICD-10-CM | POA: Insufficient documentation

## 2015-07-18 LAB — CBC WITH DIFFERENTIAL/PLATELET
BASOS PCT: 2 %
Basophils Absolute: 0.1 10*3/uL (ref 0–0.1)
EOS ABS: 0.1 10*3/uL (ref 0–0.7)
Eosinophils Relative: 1 %
HCT: 35 % (ref 35.0–47.0)
Hemoglobin: 11.8 g/dL — ABNORMAL LOW (ref 12.0–16.0)
LYMPHS ABS: 1.2 10*3/uL (ref 1.0–3.6)
Lymphocytes Relative: 24 %
MCH: 31 pg (ref 26.0–34.0)
MCHC: 33.7 g/dL (ref 32.0–36.0)
MCV: 92.1 fL (ref 80.0–100.0)
MONO ABS: 0.3 10*3/uL (ref 0.2–0.9)
MONOS PCT: 7 %
NEUTROS PCT: 66 %
Neutro Abs: 3.3 10*3/uL (ref 1.4–6.5)
Platelets: 184 10*3/uL (ref 150–440)
RBC: 3.8 MIL/uL (ref 3.80–5.20)
RDW: 14 % (ref 11.5–14.5)
WBC: 4.9 10*3/uL (ref 3.6–11.0)

## 2015-07-18 LAB — IRON AND TIBC
Iron: 68 ug/dL (ref 28–170)
SATURATION RATIOS: 23 % (ref 10.4–31.8)
TIBC: 292 ug/dL (ref 250–450)
UIBC: 224 ug/dL

## 2015-07-18 LAB — FERRITIN: FERRITIN: 55 ng/mL (ref 11–307)

## 2015-07-18 NOTE — Progress Notes (Signed)
[facility]  Telephone:([phone]) [phone]  Fax:([phone]     Kelsey Le DOB: 1930/10/27  MR#: [id number]  MWN#:[id number]#:[id number]  Patient Care Team: Marguarite ArbourJeffrey D Sparks, MD as PCP - General (Internal Medicine)  Anemia of iron deficiency, patient intolerant of oral iron (cannot take even over-the-counter low-dose iron preparation due to severe GI upset), presented during recent hospitalization January 2016 with hemoglobin of 5.5/hematocrit 18.4 and received blood transfusion support. Also started parenteral iron therapy on January 05, 2014, with Venofer 500 mg IV x1  INTERVAL HISTORY: Patient is here for routine follow up visit for iron deficiency anemia. She currently feels well and is asymptomatic. Although the past week she has felt fatigue, short of breath on exertion and dizziness. However, her blood pressure goes up and down and she does get these symptoms often if her blood pressure is off. Her appetite is good. She denies blood in stool or melena. She denies pain. She has no complaints today.   REVIEW OF SYSTEMS:   Review of Systems  Constitutional: Negative.   HENT: Negative.   Eyes: Negative.   Respiratory: Negative.   Cardiovascular: Negative.   Gastrointestinal: Negative.   Genitourinary: Negative.   Musculoskeletal: Negative.   Skin: Negative.   Neurological: Negative.   Endo/Heme/Allergies: Negative.   Psychiatric/Behavioral: Negative.     As per HPI. Otherwise, a complete review of systems is negatve.  ONCOLOGY HISTORY:  No history exists.    PAST MEDICAL HISTORY: Past Medical History  Diagnosis Date  . Fractured hip (HCC)   . Broken wrist   . Syncope and collapse   . Heart murmur   . Arrhythmia   . Asthma   . Broken back   . Broken wrist     PAST SURGICAL HISTORY: Past Surgical History  Procedure Laterality Date  . Hip fracture surgery    . Tubal ligation    . Partial hysterectomy    . Back surgery      back fusion  . Kyphoplasty N/A  08/19/2014    Procedure: KYPHOPLASTY;  Surgeon: Kennedy BuckerMichael Menz, MD;  Location: [facility] ORS;  Service: Orthopedics;  Laterality: N/A;    FAMILY HISTORY Family History  Problem Relation Age of Onset  . Heart disease Sister     GYNECOLOGIC HISTORY:  No LMP recorded. Patient is postmenopausal.     ADVANCED DIRECTIVES:    HEALTH MAINTENANCE: Social History  Substance Use Topics  . Smoking status: Never Smoker   . Smokeless tobacco: Not on file  . Alcohol Use: No    Allergies  Allergen Reactions  . Penicillins     antibiotics    Current Outpatient Prescriptions  Medication Sig Dispense Refill  . acetaminophen (TYLENOL) 500 MG tablet Take 500 mg by mouth as needed.     . fexofenadine (ALLEGRA) 30 MG tablet Take 30 mg by mouth as needed.     No current facility-administered medications for this visit.    OBJECTIVE: BP 162/76 mmHg  Pulse 66  Temp(Src) 98.1 F (36.7 C) (Oral)  Resp 17  Wt 121 lb 11.1 oz (55.2 kg)   Body mass index is 20.25 kg/(m^2).    ECOG FS:0 - Asymptomatic  General: Well-developed, well-nourished, no acute distress. Eyes: Pink conjunctiva, anicteric sclera. HEENT: Normocephalic, moist mucous membranes, clear oropharnyx. Lungs: Clear to auscultation bilaterally. Heart: Regular rate and rhythm. Positive for murmur Musculoskeletal: No edema, cyanosis, or clubbing. Neuro: Alert, answering all questions appropriately.  Skin: No rashes or petechiae noted. Psych: Normal affect.  LAB RESULTS:  Clinical Support on 07/18/2015  Component Date Value Ref Range Status  . WBC 07/18/2015 4.9  3.6 - 11.0 K/uL Final  . RBC 07/18/2015 3.80  3.80 - 5.20 MIL/uL Final  . Hemoglobin 07/18/2015 11.8* 12.0 - 16.0 g/dL Final  . HCT 16/10/9602 35.0  35.0 - 47.0 % Final  . MCV 07/18/2015 92.1  80.0 - 100.0 fL Final  . MCH 07/18/2015 31.0  26.0 - 34.0 pg Final  . MCHC 07/18/2015 33.7  32.0 - 36.0 g/dL Final  . RDW 54/09/8117 14.0  11.5 - 14.5 % Final  . Platelets  07/18/2015 184  150 - 440 K/uL Final  . Neutrophils Relative % 07/18/2015 66   Final  . Neutro Abs 07/18/2015 3.3  1.4 - 6.5 K/uL Final  . Lymphocytes Relative 07/18/2015 24   Final  . Lymphs Abs 07/18/2015 1.2  1.0 - 3.6 K/uL Final  . Monocytes Relative 07/18/2015 7   Final  . Monocytes Absolute 07/18/2015 0.3  0.2 - 0.9 K/uL Final  . Eosinophils Relative 07/18/2015 1   Final  . Eosinophils Absolute 07/18/2015 0.1  0 - 0.7 K/uL Final  . Basophils Relative 07/18/2015 2   Final  . Basophils Absolute 07/18/2015 0.1  0 - 0.1 K/uL Final  . Iron 07/18/2015 68  28 - 170 ug/dL Final  . TIBC 14/78/2956 292  250 - 450 ug/dL Final  . Saturation Ratios 07/18/2015 23  10.4 - 31.8 % Final  . UIBC 07/18/2015 224   Final  . Ferritin 07/18/2015 55  11 - 307 ng/mL Final    STUDIES: No results found.  ASSESSMENT & PLAN:    1. Iron Deficiency Anemia- history of intermittent dark stools with recent reportedly negative EGD/colonoscopy/capsule endoscopy done at [facility] in 2015. Patient has received 1 g dose of IV Venofer in January 2016. Her HGB today is 11.8, iron studies  WNL today. Return to clinic in 6 months with repeat labs.   Patient expressed understanding and was in agreement with this plan. She also understands that She can call clinic at any time with any questions, concerns, or complaints.   Dr. Orlie Dakin was available for consultation and review of plan of care for this patient.  Genevie Cheshire, NP   07/18/2015 12:55 PM

## 2015-07-18 NOTE — Progress Notes (Signed)
Follow up for IDA. Patient feels well. Did admit to some feelings of weakness over this past week. Went out to eat for her Birthday yesterday. Eats very little meats. Likes vegetables. Bowels are regular. Denies blood in stools. Has some dyspnea and dizziness both of which tend to be cardiac related. BP goes up and down but states she is overly sensitive to medications and can't take many of them. Patient reports she has very brittle bones, she has broken her back 3 different times. Has occ L hip pain very intermittently.Has received IV iron in the past.

## 2015-08-12 ENCOUNTER — Ambulatory Visit (INDEPENDENT_AMBULATORY_CARE_PROVIDER_SITE_OTHER): Payer: Medicare Other | Admitting: Cardiovascular Disease

## 2015-08-12 ENCOUNTER — Encounter: Payer: Self-pay | Admitting: Cardiovascular Disease

## 2015-08-12 ENCOUNTER — Ambulatory Visit (INDEPENDENT_AMBULATORY_CARE_PROVIDER_SITE_OTHER): Payer: Medicare Other

## 2015-08-12 ENCOUNTER — Other Ambulatory Visit: Payer: Self-pay

## 2015-08-12 VITALS — BP 150/60 | HR 62 | Ht 65.0 in | Wt 119.5 lb

## 2015-08-12 DIAGNOSIS — I359 Nonrheumatic aortic valve disorder, unspecified: Secondary | ICD-10-CM | POA: Diagnosis not present

## 2015-08-12 NOTE — Progress Notes (Signed)
Cardiology Office Note   Date:  08/12/2015   ID:  Kelsey Le, DOB December 15, 1930, MRN [id number]  PCP:  Marguarite ArbourSPARKS,JEFFREY D, MD  Cardiologist:   Lorine BearsMuhammad Zymarion Favorite, MD   Chief Complaint  Patient presents with  . Other    Follow up from echo and carotid u/s. Patient just had her Echo today.  Meds reviewed by the patient verbally.       History of Present Illness: Kelsey Le is a 82 y.o. female who presents for  a follow-up visit regarding asymptomatic aortic stenosis. Most recent echocardiogram in July 2016 showed normal LV systolic function with moderate aortic stenosis and small posterior pericardial effusion. Aortic valve mean gradient was 31 mmHg with a valve area of 1.03.  A nuclear stress test in May 2015 showed no evidence of ischemia. In December 2015, she had black stool and was found to be severely anemic with a hemoglobin of 7.2. [place] and [place] transfusion. According to the patient, no obvious source of bleeding was identified in spite of extensive GI workup. She was diagnosed with severe iron deficiency anemia and was started on IV iron with improvement.  She has chronic back pain and has underwent kyphoplasty in the past.  She also has known history of anxiety with mild memory problems. She denies chest pain or shortness of breath.  Her biggest problem continues to be dizziness described as lightheadedness. She reports that this happens of her blood pressure goes below 120 systolic or if he goes above 140. She is extremely sensitive to medications and has been hesitant to give her any antihypertensive medications.  During the visit, she kept repeating the same questions and unconcerned that her memory is getting worse. We repeated echocardiogram today which showed relatively stable aortic stenosis with a mean gradient of 28 mmHg and valve area of 0.95 cm. Carotid Doppler last month showed no significant disease.   Past Medical History:    Diagnosis Date  . Arrhythmia   . Asthma   . Broken back   . Broken wrist   . Broken wrist   . Fractured hip (HCC)   . Heart murmur   . Syncope and collapse     Past Surgical History:  Procedure Laterality Date  . BACK SURGERY     back fusion  . HIP FRACTURE SURGERY    . KYPHOPLASTY N/A 08/19/2014   Procedure: KYPHOPLASTY;  Surgeon: Kennedy BuckerMichael Menz, MD;  Location: [facility] ORS;  Service: Orthopedics;  Laterality: N/A;  . PARTIAL HYSTERECTOMY    . TUBAL LIGATION       Current Outpatient Prescriptions  Medication Sig Dispense Refill  . acetaminophen (TYLENOL) 500 MG tablet Take 500 mg by mouth as needed.     . fexofenadine (ALLEGRA) 30 MG tablet Take 30 mg by mouth as needed.     No current facility-administered medications for this visit.     Allergies:   Penicillins    Social History:  The patient  reports that she has never smoked. She has never used smokeless tobacco. She reports that she does not drink alcohol or use drugs.   Family History:  The patient's family history includes Heart disease in her sister.    ROS:  Please see the history of present illness.   Otherwise, review of systems are positive for none.   All other systems are reviewed and negative.    PHYSICAL EXAM: VS:  BP (!) 150/60 (BP Location: Left Arm, Patient Position:  Sitting, Cuff Size: Normal)   Pulse 62   Ht  (1.651 m)   Wt 119 lb 8 oz (54.2 kg)   BMI 19.89 kg/m  , BMI Body mass index is 19.89 kg/m. GEN: Well nourished, well developed, in no acute distress HEENT: normal Neck: no JVD, or masses. Bilateral carotid bruits versus a radiated aortic murmur Cardiac: RRR;  rubs, or gallops,no edema . There is a 3/6 systolic murmur in the aortic area which is mid to late peaking with mildly diminished S2. The murmur radiates to the carotid arteries. Respiratory:  clear to auscultation bilaterally, normal work of breathing GI: soft, nontender, nondistended, + BS MS: no deformity or atrophy Skin:  warm and dry, no rash Neuro:  Strength and sensation are intact Psych: euthymic mood, full affect   EKG:  EKG is ordered today. The ekg ordered today demonstrates  normal sinus rhythm with sinus arrhythmia. Left ventricular hypertrophy with repolarization abnormalities   Recent Labs: 07/18/2015: Hemoglobin 11.8; Platelets 184    Lipid Panel No results found for: CHOL, TRIG, HDL, CHOLHDL, VLDL, LDLCALC, LDLDIRECT    Wt Readings from Last 3 Encounters:  08/12/15 119 lb 8 oz (54.2 kg)  07/18/15 121 lb 11.1 oz (55.2 kg)  05/26/15 121 lb 12.8 oz (55.2 kg)        ASSESSMENT AND PLAN:  1. Dizziness: The exact etiology of this is not entirely clear.  Carotid Doppler showed no significant disease. Echocardiogram today shows stable moderate aortic stenosis. Consider ENT evaluation as she also has balance problems.  2.  Moderate aortic stenosis:   Her memory seems to be getting worse and she might not be a candidate for any invasive treatment of this in the future.  3. History of GI bleed with unknown source: The patient with no recurrent episodes. Most recent hemoglobin was 11  4. Anxiety: Followed by Dr. Judithann Sheen.   5. Labile hypertension: I'm hesitant to give her any antihypertensive medication given very labile blood pressure in the presence of aortic stenosis.     Disposition:   FU with me in 6 months  Signed,  Lorine Bears, MD  08/12/2015 10:42 AM    [facility]

## 2015-08-12 NOTE — Patient Instructions (Signed)
Medication Instructions: No change.   Labwork: None.   Procedures/Testing: None.   Follow-Up: 6 months with Dr. Arida.   Any Additional Special Instructions Will Be Listed Below (If Applicable).     If you need a refill on your cardiac medications before your next appointment, please call your pharmacy.   

## 2015-09-20 ENCOUNTER — Other Ambulatory Visit: Payer: Self-pay | Admitting: Internal Medicine

## 2015-09-20 DIAGNOSIS — Z1231 Encounter for screening mammogram for malignant neoplasm of breast: Secondary | ICD-10-CM

## 2015-09-22 ENCOUNTER — Ambulatory Visit
Admission: RE | Admit: 2015-09-22 | Discharge: 2015-09-22 | Disposition: A | Discharge status: Home / Self Care | Payer: Medicare Other | Source: Ambulatory Visit | Attending: Internal Medicine | Admitting: Internal Medicine

## 2015-09-22 DIAGNOSIS — Z1231 Encounter for screening mammogram for malignant neoplasm of breast: Secondary | ICD-10-CM | POA: Diagnosis not present

## 2015-10-24 DIAGNOSIS — K5900 Constipation, unspecified: Secondary | ICD-10-CM | POA: Insufficient documentation

## 2015-10-31 DIAGNOSIS — L900 Lichen sclerosus et atrophicus: Secondary | ICD-10-CM | POA: Insufficient documentation

## 2015-11-14 ENCOUNTER — Encounter: Payer: Self-pay | Admitting: Emergency Medicine

## 2015-11-14 ENCOUNTER — Emergency Department: Payer: Medicare Other

## 2015-11-14 ENCOUNTER — Inpatient Hospital Stay
Admission: EM | Admit: 2015-11-14 | Discharge: 2015-11-16 | DRG: 378 | Disposition: A | Discharge status: Home Health | Payer: Medicare Other | Attending: Internal Medicine | Admitting: Internal Medicine

## 2015-11-14 DIAGNOSIS — R778 Other specified abnormalities of plasma proteins: Secondary | ICD-10-CM

## 2015-11-14 DIAGNOSIS — Z880 Allergy status to penicillin: Secondary | ICD-10-CM

## 2015-11-14 DIAGNOSIS — D696 Thrombocytopenia, unspecified: Secondary | ICD-10-CM | POA: Diagnosis present

## 2015-11-14 DIAGNOSIS — R531 Weakness: Secondary | ICD-10-CM

## 2015-11-14 DIAGNOSIS — J45909 Unspecified asthma, uncomplicated: Secondary | ICD-10-CM | POA: Diagnosis present

## 2015-11-14 DIAGNOSIS — Z8249 Family history of ischemic heart disease and other diseases of the circulatory system: Secondary | ICD-10-CM

## 2015-11-14 DIAGNOSIS — D62 Acute posthemorrhagic anemia: Secondary | ICD-10-CM | POA: Diagnosis present

## 2015-11-14 DIAGNOSIS — Z981 Arthrodesis status: Secondary | ICD-10-CM

## 2015-11-14 DIAGNOSIS — R011 Cardiac murmur, unspecified: Secondary | ICD-10-CM | POA: Diagnosis present

## 2015-11-14 DIAGNOSIS — R262 Difficulty in walking, not elsewhere classified: Secondary | ICD-10-CM

## 2015-11-14 DIAGNOSIS — I248 Other forms of acute ischemic heart disease: Secondary | ICD-10-CM | POA: Diagnosis present

## 2015-11-14 DIAGNOSIS — K449 Diaphragmatic hernia without obstruction or gangrene: Secondary | ICD-10-CM | POA: Diagnosis present

## 2015-11-14 DIAGNOSIS — M6281 Muscle weakness (generalized): Secondary | ICD-10-CM

## 2015-11-14 DIAGNOSIS — I350 Nonrheumatic aortic (valve) stenosis: Secondary | ICD-10-CM | POA: Diagnosis present

## 2015-11-14 DIAGNOSIS — K921 Melena: Secondary | ICD-10-CM | POA: Diagnosis present

## 2015-11-14 DIAGNOSIS — K922 Gastrointestinal hemorrhage, unspecified: Secondary | ICD-10-CM

## 2015-11-14 DIAGNOSIS — R7989 Other specified abnormal findings of blood chemistry: Secondary | ICD-10-CM

## 2015-11-14 DIAGNOSIS — Z9289 Personal history of other medical treatment: Secondary | ICD-10-CM

## 2015-11-14 DIAGNOSIS — Z803 Family history of malignant neoplasm of breast: Secondary | ICD-10-CM | POA: Diagnosis not present

## 2015-11-14 LAB — PROTIME-INR
INR: 1.01
Prothrombin Time: 13.3 seconds (ref 11.4–15.2)

## 2015-11-14 LAB — TROPONIN I: TROPONIN I: 0.03 ng/mL — AB (ref <0.03)

## 2015-11-14 LAB — COMPREHENSIVE METABOLIC PANEL
ALT: 13 U/L — ABNORMAL LOW (ref 14–54)
ANION GAP: 5 (ref 5–15)
AST: 28 U/L (ref 15–41)
Albumin: 3.4 g/dL — ABNORMAL LOW (ref 3.5–5.0)
Alkaline Phosphatase: 48 U/L (ref 38–126)
BUN: 39 mg/dL — ABNORMAL HIGH (ref 6–20)
CHLORIDE: 112 mmol/L — AB (ref 101–111)
CO2: 23 mmol/L (ref 22–32)
Calcium: 8.6 mg/dL — ABNORMAL LOW (ref 8.9–10.3)
Creatinine, Ser: 0.88 mg/dL (ref 0.44–1.00)
GFR calc non Af Amer: 59 mL/min — ABNORMAL LOW (ref >60)
Glucose, Bld: 87 mg/dL (ref 65–99)
POTASSIUM: 3.6 mmol/L (ref 3.5–5.1)
SODIUM: 140 mmol/L (ref 135–145)
Total Bilirubin: 0.6 mg/dL (ref 0.3–1.2)
Total Protein: 5.7 g/dL — ABNORMAL LOW (ref 6.5–8.1)

## 2015-11-14 LAB — ABO/RH: ABO/RH(D): A POS

## 2015-11-14 LAB — CBC
HCT: 19 % — ABNORMAL LOW (ref 35.0–47.0)
HEMOGLOBIN: 6.3 g/dL — AB (ref 12.0–16.0)
MCH: 31 pg (ref 26.0–34.0)
MCHC: 33.1 g/dL (ref 32.0–36.0)
MCV: 93.8 fL (ref 80.0–100.0)
Platelets: 167 10*3/uL (ref 150–440)
RBC: 2.02 MIL/uL — AB (ref 3.80–5.20)
RDW: 15 % — ABNORMAL HIGH (ref 11.5–14.5)
WBC: 4.6 10*3/uL (ref 3.6–11.0)

## 2015-11-14 MED ORDER — ONDANSETRON HCL 4 MG PO TABS
4.0000 mg | ORAL_TABLET | Freq: Four times a day (QID) | ORAL | Status: DC | PRN
Start: 2015-11-14 — End: 2015-11-16

## 2015-11-14 MED ORDER — PANTOPRAZOLE SODIUM 40 MG IV SOLR: 40.0000 mg | Freq: Two times a day (BID) | INTRAVENOUS | Status: DC

## 2015-11-14 MED ORDER — SODIUM CHLORIDE 0.9 % IV SOLN
80.0000 mg | Freq: Once | INTRAVENOUS | Status: AC
  Administered 2015-11-14: 80 mg via INTRAVENOUS
  Filled 2015-11-14 (×2): qty 80

## 2015-11-14 MED ORDER — VITAMIN B-12 1000 MCG PO TABS
1000.0000 ug | ORAL_TABLET | Freq: Every day | ORAL | Status: DC
  Administered 2015-11-14 – 2015-11-16 (×3): 1000 ug via ORAL
  Filled 2015-11-14 (×3): qty 1

## 2015-11-14 MED ORDER — ONDANSETRON HCL 4 MG/2ML IJ SOLN: 4.0000 mg | Freq: Four times a day (QID) | INTRAMUSCULAR | Status: DC | PRN

## 2015-11-14 MED ORDER — ACETAMINOPHEN 650 MG RE SUPP: 650.0000 mg | Freq: Four times a day (QID) | RECTAL | Status: DC | PRN

## 2015-11-14 MED ORDER — SODIUM CHLORIDE 0.9 % IV SOLN
10.0000 mL/h | Freq: Once | INTRAVENOUS | Status: AC
  Administered 2015-11-14: 10 mL/h via INTRAVENOUS

## 2015-11-14 MED ORDER — ACETAMINOPHEN 325 MG PO TABS: 650.0000 mg | ORAL_TABLET | Freq: Four times a day (QID) | ORAL | Status: DC | PRN

## 2015-11-14 MED ORDER — SODIUM CHLORIDE 0.9 % IV SOLN
INTRAVENOUS | Status: DC
  Administered 2015-11-14: 21:00:00 via INTRAVENOUS

## 2015-11-14 MED ORDER — TRAMADOL HCL 50 MG PO TABS: 50.0000 mg | ORAL_TABLET | Freq: Four times a day (QID) | ORAL | Status: DC | PRN

## 2015-11-14 MED ORDER — SODIUM CHLORIDE 0.9 % IV SOLN
8.0000 mg/h | INTRAVENOUS | Status: DC
  Administered 2015-11-14 – 2015-11-15 (×2): 8 mg/h via INTRAVENOUS
  Filled 2015-11-14 (×3): qty 80

## 2015-11-14 NOTE — H&P (Signed)
  GI w/u in the past---pt's medical record  She underwent an upper endoscopy on 09/17/2013 by Dr. Ander PurpuraJohn Jones [facility] for indication of anemia. The study showed: a small size hiatal hernia, esophageal reflux with esophagitis starting at 38 cm from the incisors in the gastroesophageal junction. There was patchy discontinuous granularity erythema of the mucosa without bleeding of the stomach body and antrum compatible with gastritis. Duodenal bulb and second part of the duodenum. Appear normal. The pathology returned active chronic Helicobacter pylori associated with gastritis and intestinal metaplasia. Negative for dysplasia.  2. She underwent a colonoscopy on 09/17/2013 by Dr. Ander PurpuraJohn Jones [facility] for indication anemia the entire colon and rectum appeared normal. "There was no evidence of neoplasm or diverticulosis."  3. Capsule endoscopy report by her Dr. Zachery DakinsJohn Richard Jones dated 11/17/2013 and performed for history of anemia and dark stools questionable melena of unsettled etiology. 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 He was advised to have supplemental iron therapy, consider enteroscopy with AVM ablation if there is a significant drop in her hemoglobin.  4. [facility] on 08/05/2013 for anemia. A 5 cm hiatal hernia, enterogastritis and otherwise unremarkable study. Pathology returned gastric biopsy showing gastric mucinous mucosa with mildly active chronic gastritis. Negative H. pylori, negative intestinal metaplasia, negative malignancy.  5. She underwent a colonoscopy on 06/13/2009 by Dr. Niel HummerIftikhar for iron deficiency anemia the study showed small internal hemorrhoids, and is few small mouth diverticula noted in the sigmoid colon. No evidence of bleeding. Also performed an upper endoscopy 06/13/2009 for IDA showing a normal esophagus, stomach,  duodenum.  6. He went a [facility] at [facility] on 07/30/2005 for indication of personal history of colon polyps and this study revealed diverticulosis, IH and no polyps. The left colon was extremely redundant.

## 2015-11-14 NOTE — ED Notes (Signed)
Patient transported to X-ray 

## 2015-11-14 NOTE — ED Notes (Signed)
Per Dr Eliane DecreeS. Patel blood is to be transfused over 2 hours.

## 2015-11-14 NOTE — ED Triage Notes (Addendum)
Has been having black stools since Wednesday. Pt has had weakness and fatigue. Hx of blood transfusions in past.  Son reports her color is same as normal.  Has had shortness of breath with exertion. No respiratory distress in triage. Pt is in wheelchair. Normal BP is 120-130 systolic. In 90s today

## 2015-11-14 NOTE — ED Notes (Signed)
Consent signed by patient, family at bedside. All members verbalized understanding.

## 2015-11-14 NOTE — ED Triage Notes (Signed)
Pt also c/o some chest pain because she will have to breath hard when she does stuff.

## 2015-11-14 NOTE — ED Provider Notes (Signed)
[facility] Emergency Department Provider Note  ____________________________________________   I have reviewed the triage vital signs and the nursing notes.   HISTORY  Chief Complaint Weakness and Melena    HPI Kelsey Le is a 82 y.o. female with a history apparently of anemia requiring transfusions, as well as GI problems in the past presents today complaining of black stool since last Wednesday of feeling lightheaded and weak. She was sent in by PCP. She denies any abdominal pain or vomiting. No hematemesis. No chest pain or shortness of breath. She denies taking any medication. She takes no aspirin, no NSAIDs, no over-the-counter pain medications no anticoagulation.  Past Medical History:  Diagnosis Date  . Arrhythmia   . Asthma   . Broken back   . Broken wrist   . Broken wrist   . Fractured hip (HCC)   . Heart murmur   . Syncope and collapse     Patient Active Problem List   Diagnosis Date Noted  . GI bleed 03/01/2014  . Aortic stenosis 07/27/2013  . Heart murmur 04/26/2013  . SOB (shortness of breath) 04/26/2013  . Elevated blood pressure reading without diagnosis of hypertension 04/26/2013    Past Surgical History:  Procedure Laterality Date  . BACK SURGERY     back fusion  . HIP FRACTURE SURGERY    . KYPHOPLASTY N/A 08/19/2014   Procedure: KYPHOPLASTY;  Surgeon: Kennedy BuckerMichael Menz, MD;  Location: [facility] ORS;  Service: Orthopedics;  Laterality: N/A;  . PARTIAL HYSTERECTOMY    . TUBAL LIGATION      Prior to Admission medications   Medication Sig Start Date End Date Taking? Authorizing Provider  acetaminophen (TYLENOL) 500 MG tablet Take 500 mg by mouth as needed.     Historical Provider, MD  fexofenadine (ALLEGRA) 30 MG tablet Take 30 mg by mouth as needed.    Historical Provider, MD    Allergies Penicillins  Family History  Problem Relation Age of Onset  . Heart disease Sister   . Breast cancer Maternal Aunt     Social  History Social History  Substance Use Topics  . Smoking status: Never Smoker  . Smokeless tobacco: Never Used  . Alcohol use No    Review of Systems Constitutional: No fever/chills Eyes: No visual changes. ENT: No sore throat. No stiff neck no neck pain Cardiovascular: Denies chest pain. Respiratory: Denies shortness of breath. Gastrointestinal:   See history of present illness Genitourinary: Negative for dysuria. Musculoskeletal: Negative lower extremity swelling Skin: Negative for rash. Neurological: Negative for severe headaches, focal weakness or numbness. 10-point ROS otherwise negative.  ____________________________________________   PHYSICAL EXAM:  VITAL SIGNS: ED Triage Vitals  Enc Vitals Group     BP 11/14/15 1546 (!) 95/44     Pulse Rate 11/14/15 1546 94     Resp 11/14/15 1546 16     Temp 11/14/15 1546 98.3 F (36.8 C)     Temp Source 11/14/15 1546 Oral     SpO2 11/14/15 1546 98 %     Weight 11/14/15 1549 120 lb (54.4 kg)     Height 11/14/15 1549 5\' 5"  (1.651 m)     Head Circumference --      Peak Flow --      Pain Score --      Pain Loc --      Pain Edu? --      Excl. in GC? --     Constitutional: Alert and oriented. Well appearing and in  no acute distress.Pale but otherwise very well-appearing Eyes: Conjunctivae are normal. PERRL. EOMI. Head: Atraumatic. Nose: No congestion/rhinnorhea. Mouth/Throat: Mucous membranes are moist.  Oropharynx non-erythematous. Neck: No stridor.   Nontender with no meningismus Cardiovascular: Normal rate, regular rhythm. Grossly normal heart sounds.  Good peripheral circulation. Respiratory: Normal respiratory effort.  No retractions. Lungs CTAB. Abdominal: Soft and nontender. No distention. No guarding no rebound Back:  There is no focal tenderness or step off.  there is no midline tenderness there are no lesions noted. there is no CVA tenderness Rectal exam: Guaiac positive black stool Musculoskeletal: No lower  extremity tenderness, no upper extremity tenderness. No joint effusions, no DVT signs strong distal pulses no edema Neurologic:  Normal speech and language. No gross focal neurologic deficits are appreciated.  Skin:  Skin is warm, dry and intact. No rash noted. Psychiatric: Mood and affect are normal. Speech and behavior are normal.  ____________________________________________   LABS (all labs ordered are listed, but only abnormal results are displayed)  Labs Reviewed  CBC - Abnormal; Notable for the following:       Result Value   RBC 2.02 (*)    Hemoglobin 6.3 (*)    HCT 19.0 (*)    RDW 15.0 (*)    All other components within normal limits  COMPREHENSIVE METABOLIC PANEL  TROPONIN I  PROTIME-INR  POC OCCULT BLOOD, ED  TYPE AND SCREEN  PREPARE RBC (CROSSMATCH)   ____________________________________________  EKG  I personally interpreted any EKGs ordered by me or triage EKG shows normal sinus rhythm at 80 bpm no acute ST elevation or acute ST depression normal axis ____________________________________________  RADIOLOGY  I reviewed any imaging ordered by me or triage that were performed during my shift and, if possible, patient and/or family made aware of any abnormal findings. ____________________________________________   PROCEDURES  Procedure(s) performed: None  Procedures  Critical Care performed: CRITICAL CARE Performed by: Jeanmarie Plant   Total critical care time: 45 minutes  Critical care time was exclusive of separately billable procedures and treating other patients.  Critical care was necessary to treat or prevent imminent or life-threatening deterioration.  Critical care was time spent personally by me on the following activities: development of treatment plan with patient and/or surrogate as well as nursing, discussions with consultants, evaluation of patient's response to treatment, examination of patient, obtaining history from patient or  surrogate, ordering and performing treatments and interventions, ordering and review of laboratory studies, ordering and review of radiographic studies, pulse oximetry and re-evaluation of patient's condition.   ____________________________________________   INITIAL IMPRESSION / ASSESSMENT AND PLAN / ED COURSE  Pertinent labs & imaging results that were available during my care of the patient were reviewed by me and considered in my medical decision making (see chart for details).  Patient here with melena and anemia. I have ordered 2 units of blood for her. She does give verbal consent for transfusion. Risks benefits and alternatives to transfusion given and understood. I have also discussed with Dr. Servando Snare, of GI, who agrees with management. We will start her on a protonic and I called the pharmacy to start this. The patient is here with a clinically stable at this time. We will avoid IV fluid as this could make the problem worse and that she needs it. What she needs is blood. She has no abdominal pain and she is quite well-appearing at this moment but she has very significant anemia in the context of a GI bleed.  Clinical  Course    ____________________________________________   FINAL CLINICAL IMPRESSION(S) / ED DIAGNOSES  Final diagnoses:  None      This chart was dictated using voice recognition software.  Despite best efforts to proofread,  errors can occur which can change meaning.      Jeanmarie PlantJames A Kesleigh Morson, MD 11/14/15 [phone]

## 2015-11-14 NOTE — H&P (Signed)
[facility] [place] [facility]   PATIENT NAME: Kelsey Le    MR#:  [id number]  DATE OF BIRTH:  [birth year]  DATE OF ADMISSION:  11/14/2015  PRIMARY CARE PHYSICIAN: SPARKS,JEFFREY D, MD   REQUESTING/REFERRING PHYSICIAN: dr Alphonzo LemmingsMcshane  CHIEF COMPLAINT:  Increasing weakness fatigability and I was told to have low hemoglobin count  HISTORY OF PRESENT ILLNESS:  Kelsey Le  is a 82 y.o. 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 Patient saw his primary care physician and was asked to come to the emergency room. She was found to have hemoglobin of 6.3. Patient has melanotic stools since passed to 3 days. Denies any coffee-ground emesis.  Given her acute on chronic blood loss anemia patient is to be admitted. She ER physician has ordered 2 units of blood transfusion. Protonix drip has been initiated. GI is aware of patient being admitted.  PAST MEDICAL HISTORY:   Past Medical History:  Diagnosis Date  . Arrhythmia   . Asthma   . Broken back   . Broken wrist   . Broken wrist   . Fractured hip (HCC)   . Heart murmur   . Syncope and collapse     PAST SURGICAL HISTOIRY:   Past Surgical History:  Procedure Laterality Date  . BACK SURGERY     back fusion  . HIP FRACTURE SURGERY    . KYPHOPLASTY N/A 08/19/2014   Procedure: KYPHOPLASTY;  Surgeon: Kennedy BuckerMichael Menz, MD;  Location: [facility] ORS;  Service: Orthopedics;  Laterality: N/A;  . PARTIAL HYSTERECTOMY    . TUBAL LIGATION      SOCIAL HISTORY:   Social History  Substance Use Topics  . Smoking status: Never Smoker  . Smokeless tobacco: Never Used  . Alcohol use No    FAMILY HISTORY:   Family History  Problem Relation Age of Onset  . Heart disease Sister   . Breast cancer Maternal Aunt     DRUG ALLERGIES:   Allergies   Allergen Reactions  . Penicillins     antibiotics    REVIEW OF SYSTEMS:  Review of Systems  Constitutional: Negative for chills, fever and weight loss.  HENT: Negative for ear discharge, ear pain and nosebleeds.   Eyes: Negative for blurred vision, pain and discharge.  Respiratory: Negative for sputum production, shortness of breath, wheezing and stridor.   Cardiovascular: Negative for chest pain, palpitations, orthopnea and PND.  Gastrointestinal: Positive for melena. Negative for abdominal pain, diarrhea, nausea and vomiting.  Genitourinary: Negative for frequency and urgency.  Musculoskeletal: Positive for joint pain. Negative for back pain.  Neurological: Positive for weakness. Negative for sensory change, speech change and focal weakness.  Psychiatric/Behavioral: Negative for depression and hallucinations. The patient is not nervous/anxious.      MEDICATIONS AT HOME:   Prior to Admission medications   Medication Sig Start Date End Date Taking? Authorizing Provider  acetaminophen (TYLENOL) 500 MG tablet Take 500 mg by mouth as needed.    Yes Historical Provider, MD  vitamin B-12 (CYANOCOBALAMIN) 1000 MCG tablet Take 1,000 mcg by mouth daily.   Yes Historical Provider, MD      VITAL SIGNS:  Blood pressure (!) 120/51, pulse 87, temperature 98.6 F (37 C), temperature source Oral, resp. rate 14, height 5\' 5"  (1.651 m), weight 54.4 kg (120 lb), SpO2 99 %.  PHYSICAL EXAMINATION:  GENERAL:  82 y.o.-year-old patient lying in the bed with no acute distress.  EYES: Pupils equal, round, reactive to light and accommodation. No scleral icterus. Extraocular muscles intact.  HEENT: Head atraumatic, normocephalic. Oropharynx and nasopharynx clear.  NECK:  Supple, no jugular venous distention. No thyroid enlargement, no tenderness.  LUNGS: Normal breath sounds bilaterally, no wheezing, rales,rhonchi or crepitation. No use of accessory muscles of respiration.  CARDIOVASCULAR: S1, S2  normal. No murmurs, rubs, or gallops.  ABDOMEN: Soft, nontender, nondistended. Bowel sounds present. No organomegaly or mass.  EXTREMITIES: No pedal edema, cyanosis, or clubbing.  NEUROLOGIC: Cranial nerves II through XII are intact. Muscle strength 5/5 in all extremities. Sensation intact. Gait not checked.  PSYCHIATRIC: The patient is alert and oriented x 3.  SKIN: No obvious rash, lesion, or ulcer.   LABORATORY PANEL:   CBC  Recent Labs Lab 11/14/15 1550  WBC 4.6  HGB 6.3*  HCT 19.0*  PLT 167   ------------------------------------------------------------------------------------------------------------------  Chemistries   Recent Labs Lab 11/14/15 1550  NA 140  K 3.6  CL 112*  CO2 23  GLUCOSE 87  BUN 39*  CREATININE 0.88  CALCIUM 8.6*  AST 28  ALT 13*  ALKPHOS 48  BILITOT 0.6   ------------------------------------------------------------------------------------------------------------------  Cardiac Enzymes  Recent Labs Lab 11/14/15 1550  TROPONINI 0.03*   ------------------------------------------------------------------------------------------------------------------  RADIOLOGY:  Dg Chest 2 View  Result Date: 11/14/2015 CLINICAL DATA:  Black stools EXAM: CHEST  2 VIEW COMPARISON:  02/19/2014 FINDINGS: Lungs are hyperaerated and clear. Apical pleural thickening is stable. The heart is moderately enlarged. Normal vascularity. No pneumothorax or pleural effusion. Upper lumbar kyphoplasty has been performed. Other compression fractures are stable. Osteopenia. IMPRESSION: No active cardiopulmonary disease. Electronically Signed   By: Jolaine ClickArthur  Hoss M.D.   On: 11/14/2015 16:25    EKG:    IMPRESSION AND PLAN:   Kelsey Le  is a 82 y.o. 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 Patient  saw his primary care physician and was asked to come to the emergency room. She was found to have hemoglobin of 6.3.  1. Acute on chronic blood loss anemia/iron deficiency anemia -We'll admit to medical floor -2 units blood transfusion ordered by ER physician -Monitor H&H daily -GI consultation -IV protonic drip -We'll keep her nothing by mouth after midnight tonight in case patient is in for EGD  2. Iron deficiency anemia -Patient's hemoglobin last in June 2017 was 11.0----6.3 today -2 units blood transfusion -She has had taken IV iron in the past -Consider referring to hematology clinic after discharge  3. DVT prophylaxis SCD and teds  Probable was discussed with patient and patient's family present emergency room risk and complications were present transfusion explained patient is agreeable to get transfusion.   All the records are reviewed and case discussed with ED provider. Management plans discussed with the patient, family and they are in agreement.  CODE STATUS: Full  TOTAL TIME TAKING CARE OF THIS PATIENT: 50 minutes.    Jazzman Loughmiller M.D on 11/14/2015 at 6:15 PM  Between 7am to 6pm - Pager - [phone]  After 6pm go to [email or web address] - password [email or web address] [facility]  [facility]  Office  [phone]  CC: Primary care physician; Marguarite ArbourSPARKS,JEFFREY D, MD

## 2015-11-15 ENCOUNTER — Inpatient Hospital Stay: Payer: Medicare Other | Admitting: Anesthesiology

## 2015-11-15 ENCOUNTER — Encounter
Admission: EM | Disposition: A | Discharge status: Home Health | Payer: Self-pay | Source: Home / Self Care | Attending: Internal Medicine

## 2015-11-15 ENCOUNTER — Encounter: Payer: Self-pay | Admitting: *Deleted

## 2015-11-15 DIAGNOSIS — K921 Melena: Principal | ICD-10-CM

## 2015-11-15 HISTORY — PX: ESOPHAGOGASTRODUODENOSCOPY (EGD) WITH PROPOFOL: SHX5813

## 2015-11-15 LAB — TYPE AND SCREEN
ABO/RH(D): A POS
ANTIBODY SCREEN: NEGATIVE
UNIT DIVISION: 0
Unit division: 0
Unit division: 0

## 2015-11-15 LAB — CBC
HCT: 24.9 % — ABNORMAL LOW (ref 35.0–47.0)
Hemoglobin: 8.6 g/dL — ABNORMAL LOW (ref 12.0–16.0)
MCH: 31 pg (ref 26.0–34.0)
MCHC: 34.6 g/dL (ref 32.0–36.0)
MCV: 89.6 fL (ref 80.0–100.0)
PLATELETS: 128 10*3/uL — AB (ref 150–440)
RBC: 2.78 MIL/uL — ABNORMAL LOW (ref 3.80–5.20)
RDW: 14.8 % — AB (ref 11.5–14.5)
WBC: 5.3 10*3/uL (ref 3.6–11.0)

## 2015-11-15 LAB — PREPARE RBC (CROSSMATCH)

## 2015-11-15 LAB — TROPONIN I: TROPONIN I: 0.07 ng/mL — AB (ref <0.03)

## 2015-11-15 SURGERY — ESOPHAGOGASTRODUODENOSCOPY (EGD) WITH PROPOFOL
Anesthesia: General

## 2015-11-15 MED ORDER — PROPOFOL 500 MG/50ML IV EMUL
INTRAVENOUS | Status: DC | PRN
  Administered 2015-11-15: 120 ug/kg/min via INTRAVENOUS

## 2015-11-15 MED ORDER — PROPOFOL 10 MG/ML IV BOLUS
INTRAVENOUS | Status: DC | PRN
  Administered 2015-11-15: 20 mg via INTRAVENOUS
  Administered 2015-11-15: 50 mg via INTRAVENOUS

## 2015-11-15 NOTE — Transfer of Care (Signed)
Immediate Anesthesia Transfer of Care Note  Patient: [name] Fiala  Procedure(s) Performed: Procedure(s): ESOPHAGOGASTRODUODENOSCOPY (EGD) WITH PROPOFOL (N/A)  Patient Location: PACU  Anesthesia Type:general  Level of Consciousness: sedated  Airway & Oxygen Therapy: Patient Spontanous Breathing and Patient connected to nasal cannula oxygen  Post-op Assessment: Report given to RN and Post -op Vital signs reviewed and stable  Post vital signs: Reviewed and stable  Last Vitals:  Vitals:   11/15/15 0145 11/15/15 0512  BP:  119/69  Pulse: 72 73  Resp: 18 18  Temp: 36.8 C 36.6 C    Last Pain:  Vitals:   11/15/15 0512  TempSrc: Oral  PainSc:          Complications: No apparent anesthesia complications

## 2015-11-15 NOTE — OR Nursing (Signed)
REPORT CALLED TO GINA ON 2C RE:EGD RESULTS. ORDERLY CALLED FOR TRANSPORT BACK TO ROOM. PT VOIDED X1 PRIOR TO DISCHARGE

## 2015-11-15 NOTE — Progress Notes (Signed)
Per MD, keep pt on clear liquids for today and slowly advance diet tomorrow.

## 2015-11-15 NOTE — Op Note (Signed)
[facility] Gastroenterology Patient Name: Kelsey Le Procedure Date: 11/15/2015 10:54 AM MRN: [id number] Account #: [id number] Date of Birth: 01/09/1930 Admit Type: Inpatient Age: [age] [facility] ROOM 4 Gender: Female Note Status: Finalized Procedure:            Upper GI endoscopy Indications:          Melena Providers:            Midge Miniumarren Phenix Vandermeulen MD, MD Referring MD:         Duane LopeJeffrey D. Judithann SheenSparks, MD (Referring MD) Medicines:            Propofol per Anesthesia Complications:        No immediate complications. Procedure:            Pre-Anesthesia Assessment:                       - Prior to the procedure, a History and Physical was                        performed, and patient medications and allergies were                        reviewed. The patient's tolerance of previous                        anesthesia was also reviewed. The risks and benefits of                        the procedure and the sedation options and risks were                        discussed with the patient. All questions were                        answered, and informed consent was obtained. Prior                        Anticoagulants: The patient has taken no previous                        anticoagulant or antiplatelet agents. ASA Grade                        Assessment: II - A patient with mild systemic disease.                        After reviewing the risks and benefits, the patient was                        deemed in satisfactory condition to undergo the                        procedure.                       After obtaining informed consent, the endoscope was                        passed under direct vision. Throughout the procedure,  the patient's blood pressure, pulse, and oxygen                        saturations were monitored continuously. The Endoscope                        was introduced through the mouth, and advanced to the   second part of duodenum. The upper GI endoscopy was                        accomplished without difficulty. The patient tolerated                        the procedure well. Findings:      A small hiatal hernia was present.      The entire examined stomach was normal.      The examined duodenum was normal. Impression:           - Small hiatal hernia.                       - Normal stomach.                       - Normal examined duodenum.                       - No specimens collected. Recommendation:       - Return patient to hospital ward for ongoing care. Procedure Code(s):    --- Professional ---                       [phone], Esophagogastroduodenoscopy, flexible, transoral;                        diagnostic, including collection of specimen(s) by                        brushing or washing, when performed (separate procedure) Diagnosis Code(s):    --- Professional ---                       K92.1, Melena (includes Hematochezia) CPT copyright 2016 American Medical Association. All rights reserved. The codes documented in this report are preliminary and upon coder review may  be revised to meet current compliance requirements. Midge Miniumarren Lakechia Nay MD, MD 11/15/2015 11:06:05 AM This report has been signed electronically. Number of Addenda: 0 Note Initiated On: 11/15/2015 10:54 AM      [facility]

## 2015-11-15 NOTE — Anesthesia Postprocedure Evaluation (Signed)
Anesthesia Post Note  Patient: Kelsey Le  Procedure(s) Performed: Procedure(s) (LRB): ESOPHAGOGASTRODUODENOSCOPY (EGD) WITH PROPOFOL (N/A)  Patient location during evaluation: PACU Anesthesia Type: General Level of consciousness: awake Pain management: pain level controlled Vital Signs Assessment: post-procedure vital signs reviewed and stable Respiratory status: spontaneous breathing Cardiovascular status: stable Anesthetic complications: no    Last Vitals:  Vitals:   11/15/15 1150 11/15/15 1230  BP: (!) 143/53 (!) 132/41  Pulse: 67 (!) 57  Resp: 14 11  Temp:  36.7 C    Last Pain:  Vitals:   11/15/15 1230  TempSrc: Oral  PainSc:                  VAN STAVEREN,Lucia Mccreadie

## 2015-11-15 NOTE — Progress Notes (Signed)
[facility] [place] [facility]   PATIENT NAME: Kelsey Le    MR#:  [id number]  DATE OF BIRTH:  25-Nov-1930  SUBJECTIVE:  CHIEF COMPLAINT:   Chief Complaint  Patient presents with  . Weakness  . Melena   The patient is 82 year old Caucasian female with past medical history significant for history of chronic iron deficiency anemia, AVMs/gastritis, who presents to the hospital with increasing fatigability, shortness of breath, lack of energy. Patient was found to have hemoglobin level of 6.3, and was admitted. She complained of melanotic stools over the past 3 days, nausea, but no vomiting. She was admitted to the hospital and transfused 2 units of packed red blood cells after which hemoglobin level improved. Patient underwent EGD, revealing hiatal hernia but no abnormalities. Patient was initiated on clear liquid diet. She admits of some epigastric abdominal discomfort for years. She does not use any nonsteroidal anti-inflammatory medications.  Review of Systems  Constitutional: Negative for chills, fever and weight loss.  HENT: Negative for congestion.   Eyes: Negative for blurred vision and double vision.  Respiratory: Negative for cough, sputum production, shortness of breath and wheezing.   Cardiovascular: Negative for chest pain, palpitations, orthopnea, leg swelling and PND.  Gastrointestinal: Positive for abdominal pain and melena. Negative for blood in stool, constipation, diarrhea, nausea and vomiting.  Genitourinary: Negative for dysuria, frequency, hematuria and urgency.  Musculoskeletal: Negative for falls.  Neurological: Negative for dizziness, tremors, focal weakness and headaches.  Endo/Heme/Allergies: Does not bruise/bleed easily.  Psychiatric/Behavioral: Negative for depression. The patient does not have insomnia.     VITAL SIGNS: Blood pressure (!) 132/41, pulse (!) 57, temperature 98.1 F (36.7 C), temperature source Oral, resp. rate 11,  height 5\' 5"  (1.651 m), weight 54.8 kg (120 lb 14.4 oz), SpO2 97 %.  PHYSICAL EXAMINATION:   GENERAL:  82 y.o.-year-old patient lying in the bed with no acute distress.  EYES: Pupils equal, round, reactive to light and accommodation. No scleral icterus. Extraocular muscles intact.  HEENT: Head atraumatic, normocephalic. Oropharynx and nasopharynx clear.  NECK:  Supple, no jugular venous distention. No thyroid enlargement, no tenderness.  LUNGS: Normal breath sounds bilaterally, no wheezing, rales,rhonchi or crepitation. No use of accessory muscles of respiration.  CARDIOVASCULAR: S1, S2 normal. 3/ 6 systolic murmur in aortic auscultation site,  no rubs, or gallops.  ABDOMEN: Soft, nontender, nondistended. Bowel sounds present. No organomegaly or mass.  EXTREMITIES: No pedal edema, cyanosis, or clubbing.  NEUROLOGIC: Cranial nerves II through XII are intact. Muscle strength 5/5 in all extremities. Sensation intact. Gait not checked.  PSYCHIATRIC: The patient is alert and oriented x 3.  SKIN: No obvious rash, lesion, or ulcer.   ORDERS/RESULTS REVIEWED:   CBC  Recent Labs Lab 11/14/15 1550 11/15/15 0516  WBC 4.6 5.3  HGB 6.3* 8.6*  HCT 19.0* 24.9*  PLT 167 128*  MCV 93.8 89.6  MCH 31.0 31.0  MCHC 33.1 34.6  RDW 15.0* 14.8*   ------------------------------------------------------------------------------------------------------------------  Chemistries   Recent Labs Lab 11/14/15 1550  NA 140  K 3.6  CL 112*  CO2 23  GLUCOSE 87  BUN 39*  CREATININE 0.88  CALCIUM 8.6*  AST 28  ALT 13*  ALKPHOS 48  BILITOT 0.6   ------------------------------------------------------------------------------------------------------------------ estimated creatinine clearance is 41.9 mL/min (by C-G formula based on SCr of 0.88 mg/dL). ------------------------------------------------------------------------------------------------------------------ No results for input(s): TSH, T4TOTAL,  T3FREE, THYROIDAB in the last 72 hours.  Invalid input(s): FREET3  Cardiac Enzymes  Recent Labs  Lab 11/14/15 1550  TROPONINI 0.03*   ------------------------------------------------------------------------------------------------------------------ Invalid input(s): POCBNP ---------------------------------------------------------------------------------------------------------------  RADIOLOGY: Dg Chest 2 View  Result Date: 11/14/2015 CLINICAL DATA:  Black stools EXAM: CHEST  2 VIEW COMPARISON:  02/19/2014 FINDINGS: Lungs are hyperaerated and clear. Apical pleural thickening is stable. The heart is moderately enlarged. Normal vascularity. No pneumothorax or pleural effusion. Upper lumbar kyphoplasty has been performed. Other compression fractures are stable. Osteopenia. IMPRESSION: No active cardiopulmonary disease. Electronically Signed   By: Jolaine ClickArthur  Hoss M.D.   On: 11/14/2015 16:25    EKG:  Orders placed or performed during the hospital encounter of 11/14/15  . ED EKG within 10 minutes  . ED EKG within 10 minutes    ASSESSMENT AND PLAN:  Active Problems:   Melena  #1, melena, status post EGD by Dr. Servando SnareWohl 14th of November 2017, unremarkable but hiatal hernia. Patient is to continue Protonix, she is initiated on clear liquid diet. At that will be advanced to soft and patient will be discharged likely tomorrow if hemoglobin remains stable #2. Acute posthemorrhagic anemia, status post 2 units of packed red blood cell transfusion. Follow hemoglobin level in the morning, no obvious source of bleeding noted, get bleeding scan if bleeding recurs #3. Elevated troponin, likely due to anemia, demand ischemia, recheck troponin level later today, echocardiogram in August that revealed moderate to severe aortic stenosis #4. Moderate to severe aortic stenosis, supportive therapy #5. thrombocytopenia, likely consumption, follow in the morning  Management plans discussed with the patient, family  and they are in agreement.   DRUG ALLERGIES:  Allergies  Allergen Reactions  . Penicillins     antibiotics    CODE STATUS:     Code Status Orders        Start     Ordered   11/14/15 1920  Full code  Continuous     11/14/15 1919    Code Status History    Date Active Date Inactive Code Status Order ID Comments User Context   08/19/2014  2:52 PM 08/19/2014  6:42 PM Full Code [id number]  Kennedy BuckerMichael Menz, MD Inpatient      TOTAL TIME TAKING CARE OF THIS PATIENT: 40 minutes.    Katharina CaperVAICKUTE,Charmion Hapke M.D on 11/15/2015 at 1:37 PM  Between 7am to 6pm - Pager - [phone]  After 6pm go to [email or web address] - password [email or web address]  [facility] [place] [facility]  Office  [phone]  CC: Primary care physician; Marguarite ArbourSPARKS,JEFFREY D, MD

## 2015-11-15 NOTE — Anesthesia Preprocedure Evaluation (Signed)
Anesthesia Evaluation  Patient identified by MRN, date of birth, ID band Patient awake    Reviewed: Allergy & Precautions, NPO status , Patient's Chart, lab work & pertinent test results  Airway Mallampati: II       Dental  (+) Teeth Intact, Caps   Pulmonary asthma ,    breath sounds clear to auscultation       Cardiovascular Exercise Tolerance: Good + Valvular Problems/Murmurs  Rhythm:Regular Rate:Normal     Neuro/Psych negative neurological ROS  negative psych ROS   GI/Hepatic negative GI ROS, Neg liver ROS,   Endo/Other  negative endocrine ROS  Renal/GU negative Renal ROS     Musculoskeletal   Abdominal Normal abdominal exam  (+)   Peds  Hematology negative hematology ROS (+)   Anesthesia Other Findings   Reproductive/Obstetrics                             Anesthesia Physical Anesthesia Plan  ASA: III  Anesthesia Plan: General   Post-op Pain Management:    Induction: Intravenous  Airway Management Planned: Natural Airway and Nasal Cannula  Additional Equipment:   Intra-op Plan:   Post-operative Plan:   Informed Consent: I have reviewed the patients History and Physical, chart, labs and discussed the procedure including the risks, benefits and alternatives for the proposed anesthesia with the patient or authorized representative who has indicated his/her understanding and acceptance.     Plan Discussed with: CRNA  Anesthesia Plan Comments:         Anesthesia Quick Evaluation

## 2015-11-15 NOTE — Progress Notes (Signed)
PT Cancellation Note  Patient Details Name: Kelsey Le MRN: [id number] DOB: Apr 12, 1930   Cancelled Treatment:    Reason Eval/Treat Not Completed: Patient at procedure or test/unavailable (Consult received and chart reviewed. Patient currently off unit for EGD.  Will re-attempt at later time/date as patient available and medically appropriate.)   Catalea Labrecque H. Manson PasseyBrown, PT, DPT, NCS 11/15/15, 1:56 PM [phone]

## 2015-11-16 ENCOUNTER — Encounter: Payer: Self-pay | Admitting: Gastroenterology

## 2015-11-16 DIAGNOSIS — Z9289 Personal history of other medical treatment: Secondary | ICD-10-CM

## 2015-11-16 DIAGNOSIS — D62 Acute posthemorrhagic anemia: Secondary | ICD-10-CM

## 2015-11-16 DIAGNOSIS — I350 Nonrheumatic aortic (valve) stenosis: Secondary | ICD-10-CM

## 2015-11-16 DIAGNOSIS — D696 Thrombocytopenia, unspecified: Secondary | ICD-10-CM

## 2015-11-16 DIAGNOSIS — R778 Other specified abnormalities of plasma proteins: Secondary | ICD-10-CM

## 2015-11-16 DIAGNOSIS — R7989 Other specified abnormal findings of blood chemistry: Secondary | ICD-10-CM

## 2015-11-16 DIAGNOSIS — R531 Weakness: Secondary | ICD-10-CM

## 2015-11-16 LAB — HEMOGLOBIN
Hemoglobin: 10 g/dL — ABNORMAL LOW (ref 12.0–16.0)
Hemoglobin: 10.6 g/dL — ABNORMAL LOW (ref 12.0–16.0)

## 2015-11-16 LAB — PLATELET COUNT: Platelets: 173 10*3/uL (ref 150–440)

## 2015-11-16 NOTE — Progress Notes (Signed)
Patient discharge teaching given, including activity, diet, follow-up appoints, and medications. Patient verbalized understanding of all discharge instructions. IV access was d/c'd. Vitals are stable. Home Health set up. Skin is intact except as charted in most recent assessments. Pt to be escorted out by NT, to be driven home by son.  Churchill Grimsley [place]

## 2015-11-16 NOTE — Care Management Important Message (Signed)
Important Message  Patient Details  Name: Kelsey SextonLouise C Decook MRN: [id number] Date of Birth: April 18, 1930   Medicare Important Message Given:  N/A - LOS <3 / Initial given by admissions    Chapman FitchBOWEN, Paloma Grange T, RN 11/16/2015, 3:33 PM

## 2015-11-16 NOTE — Progress Notes (Signed)
Kelsey PihJackie from Lab called and verbalized that blood work that was just drawn and sent down had a clot in it and it needed to be redrawn. Anselm Junglingonyers,Connor Foxworthy M

## 2015-11-16 NOTE — Discharge Summary (Signed)
[facility] [place] [facility]   PATIENT NAME: Kelsey Le    MR#:  [id number]  DATE OF BIRTH:  01-10-1930  DATE OF ADMISSION:  11/14/2015 ADMITTING PHYSICIAN: Enedina FinnerSona Patel, MD  DATE OF DISCHARGE: No discharge date for patient encounter.  PRIMARY CARE PHYSICIAN: SPARKS,JEFFREY D, MD     ADMISSION DIAGNOSIS:  Gastrointestinal hemorrhage, unspecified gastrointestinal hemorrhage type [K92.2]  DISCHARGE DIAGNOSIS:  Active Problems:   Melena   Acute posthemorrhagic anemia   H/O transfusion of packed red blood cells   Elevated troponin   Severe aortic stenosis   Thrombocytopenia (HCC)   Generalized weakness   SECONDARY DIAGNOSIS:   Past Medical History:  Diagnosis Date  . Arrhythmia   . Asthma   . Broken back   . Broken wrist   . Broken wrist   . Fractured hip (HCC)   . Heart murmur   . Syncope and collapse     .pro HOSPITAL COURSE:  The patient is 82 year old Caucasian female with past medical history significant for history of chronic iron deficiency anemia, AVMs/gastritis, who presents to the hospital with increasing fatigability, shortness of breath, lack of energy. Patient was found to have hemoglobin level of 6.3, and was admitted. She complained of melanotic stools over the past 3 days, nausea, but no vomiting. She was admitted to the hospital and transfused 2 units of packed red blood cells after which hemoglobin level improved. Patient underwent EGD, revealing hiatal hernia but no abnormalities. Patient was initiated on clear liquid diet which was advanced to soft diet. Hemoglobin level was followed and remained stable. Patient was evaluated by physical therapist and recommended home health services Discussion by problem: #1, melena, status post EGD by Dr. Servando SnareWohl 14th of November 2017, unremarkable but hiatal hernia. Patient is to continue Protonix, her diet was advanced to soft by the day of discharge.  Hemoglobin level will be rechecked  at later time today and if it remains stable, and no recurrent bleeding, she is going to be discharged home with home health services.  #2. Acute posthemorrhagic anemia, status post 2 units of packed red blood cell transfusion. Follow hemoglobin level later today. No recurrent bleeding was noted #3. Elevated troponin, 0.03 on admission, likely due to anemia, demand ischemia, rechecked troponin level was even higher, and 0.07, patient however had no chest pains, echocardiogram in August that revealed moderate to severe aortic stenosis, suspect related to aortic stenosis/demand ischemia, follow up with cardiologist as outpatient according to the schedule #4. Moderate to severe aortic stenosis, supportive therapy, home medications #5. thrombocytopenia, likely consumption, follow later today  DISCHARGE CONDITIONS:   Stable  CONSULTS OBTAINED:  Treatment Team:  Midge Miniumarren Wohl, MD  DRUG ALLERGIES:   Allergies  Allergen Reactions  . Penicillins     antibiotics    DISCHARGE MEDICATIONS:   Current Discharge Medication List    CONTINUE these medications which have NOT CHANGED   Details  acetaminophen (TYLENOL) 500 MG tablet Take 500 mg by mouth as needed.    Associated Diagnoses: IDA (iron deficiency anemia)    vitamin B-12 (CYANOCOBALAMIN) 1000 MCG tablet Take 1,000 mcg by mouth daily.         DISCHARGE INSTRUCTIONS:    The patient is to follow-up with primary care physician, primary cardiologist as outpatient  If you experience worsening of your admission symptoms, develop shortness of breath, life threatening emergency, suicidal or homicidal thoughts you must seek medical attention immediately by calling 911 or calling your MD  immediately  if symptoms less severe.  You Must read complete instructions/literature along with all the possible adverse reactions/side effects for all the Medicines you take and that have been prescribed to you. Take any new Medicines after you have  completely understood and accept all the possible adverse reactions/side effects.   Please note  You were cared for by a hospitalist during your hospital stay. If you have any questions about your discharge medications or the care you received while you were in the hospital after you are discharged, you can call the unit and asked to speak with the hospitalist on call if the hospitalist that took care of you is not available. Once you are discharged, your primary care physician will handle any further medical issues. Please note that NO REFILLS for any discharge medications will be authorized once you are discharged, as it is imperative that you return to your primary care physician (or establish a relationship with a primary care physician if you do not have one) for your aftercare needs so that they can reassess your need for medications and monitor your lab values.    Today   CHIEF COMPLAINT:   Chief Complaint  Patient presents with  . Weakness  . Melena    HISTORY OF PRESENT ILLNESS:  Kelsey ReinLouise Widdowson  is a 82 y.o. female with a known history of chronic iron deficiency anemia, AVMs/gastritis, who presents to the hospital with increasing fatigability, shortness of breath, lack of energy. Patient was found to have hemoglobin level of 6.3, and was admitted. She complained of melanotic stools over the past 3 days, nausea, but no vomiting. She was admitted to the hospital and transfused 2 units of packed red blood cells after which hemoglobin level improved. Patient underwent EGD, revealing hiatal hernia but no abnormalities. Patient was initiated on clear liquid diet which was advanced to soft diet. Hemoglobin level was followed and remained stable. Patient was evaluated by physical therapist and recommended home health services Discussion by problem: #1, melena, status post EGD by Dr. Servando SnareWohl 14th of November 2017, unremarkable but hiatal hernia. Patient is to continue Protonix, her diet was advanced  to soft by the day of discharge.  Hemoglobin level will be rechecked at later time today and if it remains stable, and no recurrent bleeding, she is going to be discharged home with home health services.  #2. Acute posthemorrhagic anemia, status post 2 units of packed red blood cell transfusion. Follow hemoglobin level later today. No recurrent bleeding was noted #3. Elevated troponin, 0.03 on admission, likely due to anemia, demand ischemia, rechecked troponin level was even higher, and 0.07, patient however had no chest pains, echocardiogram in August that revealed moderate to severe aortic stenosis, suspect related to aortic stenosis/demand ischemia, follow up with cardiologist as outpatient according to the schedule #4. Moderate to severe aortic stenosis, supportive therapy, home medications #5. thrombocytopenia, likely consumption, follow later today    VITAL SIGNS:  Blood pressure (!) 138/56, pulse 60, temperature 98 F (36.7 C), temperature source Oral, resp. rate 18, height 5\' 5"  (1.651 m), weight 54.8 kg (120 lb 14.4 oz), SpO2 100 %.  I/O:   Intake/Output Summary (Last 24 hours) at 11/16/15 1437 Last data filed at 11/16/15 1359  Gross per 24 hour  Intake             1420 ml  Output             1600 ml  Net             -  180 ml    PHYSICAL EXAMINATION:  GENERAL:  82 y.o.-year-old patient lying in the bed with no acute distress.  EYES: Pupils equal, round, reactive to light and accommodation. No scleral icterus. Extraocular muscles intact.  HEENT: Head atraumatic, normocephalic. Oropharynx and nasopharynx clear.  NECK:  Supple, no jugular venous distention. No thyroid enlargement, no tenderness.  LUNGS: Normal breath sounds bilaterally, no wheezing, rales,rhonchi or crepitation. No use of accessory muscles of respiration.  CARDIOVASCULAR: S1, S2 normal. 3/6 systolic murmur in aortic auscultation site, no rubs, or gallops.  ABDOMEN: Soft, non-tender, non-distended. Bowel sounds  present. No organomegaly or mass.  EXTREMITIES: No pedal edema, cyanosis, or clubbing.  NEUROLOGIC: Cranial nerves II through XII are intact. Muscle strength 5/5 in all extremities. Sensation intact. Gait not checked.  PSYCHIATRIC: The patient is alert and oriented x 3.  SKIN: No obvious rash, lesion, or ulcer.   DATA REVIEW:   CBC  Recent Labs Lab 11/15/15 0516 11/16/15 0518  WBC 5.3  --   HGB 8.6* 10.0*  HCT 24.9*  --   PLT 128*  --     Chemistries   Recent Labs Lab 11/14/15 1550  NA 140  K 3.6  CL 112*  CO2 23  GLUCOSE 87  BUN 39*  CREATININE 0.88  CALCIUM 8.6*  AST 28  ALT 13*  ALKPHOS 48  BILITOT 0.6    Cardiac Enzymes  Recent Labs Lab 11/15/15 0516  TROPONINI 0.07*    Microbiology Results  No results found for this or any previous visit.  RADIOLOGY:  Dg Chest 2 View  Result Date: 11/14/2015 CLINICAL DATA:  Black stools EXAM: CHEST  2 VIEW COMPARISON:  02/19/2014 FINDINGS: Lungs are hyperaerated and clear. Apical pleural thickening is stable. The heart is moderately enlarged. Normal vascularity. No pneumothorax or pleural effusion. Upper lumbar kyphoplasty has been performed. Other compression fractures are stable. Osteopenia. IMPRESSION: No active cardiopulmonary disease. Electronically Signed   By: Jolaine Click M.D.   On: 11/14/2015 16:25    EKG:   Orders placed or performed during the hospital encounter of 11/14/15  . ED EKG within 10 minutes  . ED EKG within 10 minutes      Management plans discussed with the patient, family and they are in agreement.  CODE STATUS:     Code Status Orders        Start     Ordered   11/14/15 1920  Full code  Continuous     11/14/15 1919    Code Status History    Date Active Date Inactive Code Status Order ID Comments User Context   08/19/2014  2:52 PM 08/19/2014  6:42 PM Full Code [id number]  Kennedy Bucker, MD Inpatient      TOTAL TIME TAKING CARE OF THIS PATIENT: 40 minutes.   Discussed this  patient's family Morse Brueggemann M.D on 11/16/2015 at 2:37 PM  Between 7am to 6pm - Pager - [phone]  After 6pm go to [email or web address] - password [email or web address] [facility]  [place] [facility]  Office  [phone]  CC: Primary care physician; Marguarite Arbour, MD

## 2015-11-16 NOTE — Evaluation (Signed)
Physical Therapy Evaluation Patient Details Name: Kelsey SextonLouise C Stidd MRN: [id number] DOB: 07/15/1930 Today's Date: 11/16/2015   History of Present Illness  presented to ER secondary to melena, anemia, feeling lightheaded and weak; admitted for management of acute/chronic blood-loss anemia. Status post 2 units PRBC with current HgB 10.0; EGD (11/14) unmarkable except for hiatal hernia.  Clinical Impression  Upon evaluation, patient alert and oriented; follows all commands and demonstrates good safety awareness/insight.  Bilat UE/LE strength and ROM grossly WFL and symmetrical; no focal weakness noted.  Able to complete bed mobility with mod indep; sit/stand, basic transfers and gait (200') without assist device, cga/min assist.  Slightly antalgic and staggered at times, requiring min assist and use of LE step strategy for recovery; patient relates to baseline L LE LLD.  Higher-level balance deficits evident (indicated by limited functional reach, need for external assist at times).  May benefit from outpatient follow up with orthotist to discuss benefit/options of correction.  Did trial RW during session with marked improvement in safety, stability and overall fluidity of gait pattern; recommend continued use of RW for all mobility at this time. Patient/daughter voiced agreement and understanding. Would benefit from skilled PT to address above deficits and promote optimal return to PLOF; Recommend transition to HHPT upon discharge from acute hospitalization.     Follow Up Recommendations Home health PT    Equipment Recommendations       Recommendations for Other Services  (outpatient orthotist consult)     Precautions / Restrictions Precautions Precautions: Fall Restrictions Weight Bearing Restrictions: No      Mobility  Bed Mobility Overal bed mobility: Modified Independent                Transfers Overall transfer level: Needs assistance   Transfers: Sit to/from Stand Sit to  Stand: Min assist            Ambulation/Gait Ambulation/Gait assistance: Min assist;Min guard Ambulation Distance (Feet): 200 Feet Assistive device: None       General Gait Details: intermittently staggered with multiple L lateral LOB (patient able to self-correct with step strategy); moderately antalgic to L (patient reports baseline LLD)  Stairs            Wheelchair Mobility    Modified Rankin (Stroke Patients Only)       Balance Overall balance assessment: Needs assistance Sitting-balance support: No upper extremity supported;Feet supported Sitting balance-[name] Scale: Good     Standing balance support: No upper extremity supported Standing balance-[name] Scale: Fair                               Pertinent Vitals/Pain Pain Assessment: No/denies pain    Home Living Family/patient expects to be discharged to:: Private residence Living Arrangements: Alone Available Help at Discharge: Family;Available PRN/intermittently Type of Home: Apartment Home Access: Elevator (lives on second floor of apartment complex)     Home Layout: One level Home Equipment: Walker - 2 wheels      Prior Function Level of Independence: Independent         Comments: Indep with ADLs, household activities; reports single fall within previous year.  Has small dog that she enjoys taking for a walk approx 3x/day.     Hand Dominance   Dominant Hand: Right    Extremity/Trunk Assessment   Upper Extremity Assessment: Overall WFL for tasks assessed           Lower Extremity Assessment: Overall  WFL for tasks assessed (grossly at least 4+/5; reports baseline L LE LLD due to previous hip injury/surgery)         Communication   Communication: HOH  Cognition Arousal/Alertness: Awake/alert Behavior During Therapy: WFL for tasks assessed/performed Overall Cognitive Status: Within Functional Limits for tasks assessed                      General  Comments      Exercises Other Exercises Other Exercises: 200' without assist device, L shoe donned (to simulate shoe build up for correction of apparent LLD)--decreased antalgic gait pattern; however, patient with increased sway/stagger to R at times. May benefit from more gradual correction Other Exercises: 200' with RW, sup--improved safety, stability and overall fluidity with use of RW.  Able to complete without physical assist, minimal/no episodes of LOB.  Recommend continued use of RW for all mobility efforts at this time; patient voiced agreement/understanding. Other Exercises: Toilet transfer, ambulatory with RW, close sup; sit/stand from standard height toilet with RW, close sup; standing balance for clothing management and hand hygiene, close sup-broad BOS.  Standing functional reach approx 2-3" from immediate BOS (indicative of increased fall risk); good use of contralateral UE stabilization for external support as needed   Assessment/Plan    PT Assessment Patient needs continued PT services  PT Problem List Decreased strength;Decreased activity tolerance;Decreased balance;Decreased mobility;Decreased knowledge of use of DME;Decreased safety awareness;Decreased knowledge of precautions          PT Treatment Interventions Therapeutic activities;Gait training;DME instruction;Stair training;Functional mobility training;Therapeutic exercise;Balance training;Patient/family education    PT Goals (Current goals can be found in the Care Plan section)  Acute Rehab PT Goals Patient Stated Goal: to return home this afternoon PT Goal Formulation: With patient/family Time For Goal Achievement: 11/30/15 Potential to Achieve Goals: Good    Frequency Min 2X/week   Barriers to discharge Decreased caregiver support      Co-evaluation               End of Session Equipment Utilized During Treatment: Gait belt Activity Tolerance: Patient tolerated treatment well Patient left: in  chair;with call bell/phone within reach;with chair alarm set;with family/visitor present Nurse Communication: Mobility status         Time: [id number] PT Time Calculation (min) (ACUTE ONLY): 36 min   Charges:   PT Evaluation $PT Eval Low Complexity: 1 Procedure PT Treatments $Gait Training: 8-22 mins $Therapeutic Activity: 8-22 mins   PT G Codes:        Zamani Crocker H. Manson PasseyBrown, PT, DPT, NCS 11/16/15, 11:52 AM [phone]

## 2015-11-16 NOTE — Care Management (Signed)
Patient admitted with melena.  Patient lives at home alone.  Adult children live near by for support.  PCP [facility].  Pharmacy [facility].  Patient has RW in the home.  At [place] independent.  PT has assessed patient and recommended home health PT.  Patient and son were offered home health agency preference.  [facility].  Barbara CowerJason with [facility] notified of referral  RNCM signing off.

## 2015-11-17 ENCOUNTER — Inpatient Hospital Stay
Admission: EM | Admit: 2015-11-17 | Discharge: 2015-11-21 | DRG: 481 | Disposition: A | Discharge status: Skilled Nursing Facility | Payer: Medicare Other | Attending: Internal Medicine | Admitting: Internal Medicine

## 2015-11-17 ENCOUNTER — Other Ambulatory Visit: Payer: Self-pay

## 2015-11-17 ENCOUNTER — Emergency Department: Payer: Medicare Other

## 2015-11-17 DIAGNOSIS — K5900 Constipation, unspecified: Secondary | ICD-10-CM | POA: Diagnosis present

## 2015-11-17 DIAGNOSIS — D62 Acute posthemorrhagic anemia: Secondary | ICD-10-CM | POA: Diagnosis not present

## 2015-11-17 DIAGNOSIS — W19XXXA Unspecified fall, initial encounter: Secondary | ICD-10-CM | POA: Diagnosis present

## 2015-11-17 DIAGNOSIS — Z880 Allergy status to penicillin: Secondary | ICD-10-CM

## 2015-11-17 DIAGNOSIS — S72009A Fracture of unspecified part of neck of unspecified femur, initial encounter for closed fracture: Secondary | ICD-10-CM | POA: Diagnosis present

## 2015-11-17 DIAGNOSIS — I350 Nonrheumatic aortic (valve) stenosis: Secondary | ICD-10-CM | POA: Diagnosis present

## 2015-11-17 DIAGNOSIS — S72141A Displaced intertrochanteric fracture of right femur, initial encounter for closed fracture: Secondary | ICD-10-CM | POA: Diagnosis present

## 2015-11-17 DIAGNOSIS — D509 Iron deficiency anemia, unspecified: Secondary | ICD-10-CM | POA: Diagnosis present

## 2015-11-17 DIAGNOSIS — S72001A Fracture of unspecified part of neck of right femur, initial encounter for closed fracture: Secondary | ICD-10-CM

## 2015-11-17 DIAGNOSIS — Z8249 Family history of ischemic heart disease and other diseases of the circulatory system: Secondary | ICD-10-CM | POA: Diagnosis not present

## 2015-11-17 DIAGNOSIS — Z9181 History of falling: Secondary | ICD-10-CM

## 2015-11-17 DIAGNOSIS — Z419 Encounter for procedure for purposes other than remedying health state, unspecified: Secondary | ICD-10-CM

## 2015-11-17 LAB — COMPREHENSIVE METABOLIC PANEL
ALK PHOS: 60 U/L (ref 38–126)
ALT: 16 U/L (ref 14–54)
ANION GAP: 10 (ref 5–15)
AST: 34 U/L (ref 15–41)
Albumin: 3.8 g/dL (ref 3.5–5.0)
BILIRUBIN TOTAL: 0.8 mg/dL (ref 0.3–1.2)
BUN: 22 mg/dL — ABNORMAL HIGH (ref 6–20)
CALCIUM: 8.8 mg/dL — AB (ref 8.9–10.3)
CO2: 23 mmol/L (ref 22–32)
Chloride: 108 mmol/L (ref 101–111)
Creatinine, Ser: 0.87 mg/dL (ref 0.44–1.00)
GFR, EST NON AFRICAN AMERICAN: 60 mL/min — AB (ref >60)
Glucose, Bld: 111 mg/dL — ABNORMAL HIGH (ref 65–99)
POTASSIUM: 3.8 mmol/L (ref 3.5–5.1)
Sodium: 141 mmol/L (ref 135–145)
TOTAL PROTEIN: 6.1 g/dL — AB (ref 6.5–8.1)

## 2015-11-17 LAB — URINALYSIS COMPLETE WITH MICROSCOPIC (ARMC ONLY)
BILIRUBIN URINE: NEGATIVE
Bacteria, UA: NONE SEEN
GLUCOSE, UA: NEGATIVE mg/dL
LEUKOCYTES UA: NEGATIVE
NITRITE: NEGATIVE
PH: 7 (ref 5.0–8.0)
Protein, ur: NEGATIVE mg/dL
SPECIFIC GRAVITY, URINE: 1.014 (ref 1.005–1.030)

## 2015-11-17 LAB — CBC WITH DIFFERENTIAL/PLATELET
BASOS ABS: 0.1 10*3/uL (ref 0–0.1)
BASOS PCT: 1 %
EOS PCT: 3 %
Eosinophils Absolute: 0.2 10*3/uL (ref 0–0.7)
HCT: 30.3 % — ABNORMAL LOW (ref 35.0–47.0)
Hemoglobin: 10.1 g/dL — ABNORMAL LOW (ref 12.0–16.0)
LYMPHS PCT: 37 %
Lymphs Abs: 2.4 10*3/uL (ref 1.0–3.6)
MCH: 30.7 pg (ref 26.0–34.0)
MCHC: 33.3 g/dL (ref 32.0–36.0)
MCV: 92.1 fL (ref 80.0–100.0)
Monocytes Absolute: 0.5 10*3/uL (ref 0.2–0.9)
Monocytes Relative: 9 %
NEUTROS ABS: 3.3 10*3/uL (ref 1.4–6.5)
Neutrophils Relative %: 50 %
PLATELETS: 181 10*3/uL (ref 150–440)
RBC: 3.29 MIL/uL — AB (ref 3.80–5.20)
RDW: 15.7 % — ABNORMAL HIGH (ref 11.5–14.5)
WBC: 6.4 10*3/uL (ref 3.6–11.0)

## 2015-11-17 LAB — APTT: aPTT: 28 seconds (ref 24–36)

## 2015-11-17 LAB — PROTIME-INR
INR: 1
PROTHROMBIN TIME: 13.2 s (ref 11.4–15.2)

## 2015-11-17 MED ORDER — SODIUM CHLORIDE 0.9% FLUSH: 3.0000 mL | Freq: Two times a day (BID) | INTRAVENOUS | Status: DC

## 2015-11-17 MED ORDER — METHOCARBAMOL 500 MG PO TABS
500.0000 mg | ORAL_TABLET | Freq: Three times a day (TID) | ORAL | Status: DC | PRN
  Administered 2015-11-17: 500 mg via ORAL
  Filled 2015-11-17: qty 1

## 2015-11-17 MED ORDER — MORPHINE SULFATE (PF) 4 MG/ML IV SOLN: 2.0000 mg | Freq: Once | INTRAVENOUS | Status: DC

## 2015-11-17 MED ORDER — MORPHINE SULFATE (PF) 2 MG/ML IV SOLN
2.0000 mg | INTRAVENOUS | Status: DC | PRN
  Administered 2015-11-18: 2 mg via INTRAVENOUS
  Filled 2015-11-17: qty 1

## 2015-11-17 MED ORDER — OXYCODONE HCL 5 MG PO TABS
5.0000 mg | ORAL_TABLET | ORAL | Status: DC | PRN
  Administered 2015-11-17 – 2015-11-18 (×2): 5 mg via ORAL
  Filled 2015-11-17 (×2): qty 1

## 2015-11-17 MED ORDER — SODIUM CHLORIDE 0.9 % IV SOLN
INTRAVENOUS | Status: DC
  Administered 2015-11-17: 23:00:00 via INTRAVENOUS

## 2015-11-17 MED ORDER — MORPHINE SULFATE (PF) 4 MG/ML IV SOLN
2.0000 mg | Freq: Once | INTRAVENOUS | Status: AC
  Administered 2015-11-17: 2 mg via INTRAVENOUS

## 2015-11-17 MED ORDER — FENTANYL CITRATE (PF) 100 MCG/2ML IJ SOLN
100.0000 ug | INTRAMUSCULAR | Status: DC | PRN
Start: 2015-11-17 — End: 2015-11-18
  Administered 2015-11-17 (×2): 100 ug via INTRAVENOUS
  Filled 2015-11-17 (×2): qty 2

## 2015-11-17 MED ORDER — PANTOPRAZOLE SODIUM 40 MG PO TBEC
40.0000 mg | DELAYED_RELEASE_TABLET | Freq: Two times a day (BID) | ORAL | Status: DC
  Administered 2015-11-18 – 2015-11-21 (×5): 40 mg via ORAL
  Filled 2015-11-17 (×5): qty 1

## 2015-11-17 MED ORDER — ACETAMINOPHEN 10 MG/ML IV SOLN
1000.0000 mg | Freq: Four times a day (QID) | INTRAVENOUS | Status: DC | PRN
  Filled 2015-11-17: qty 100

## 2015-11-17 MED ORDER — VITAMIN B-12 1000 MCG PO TABS
1000.0000 ug | ORAL_TABLET | Freq: Every day | ORAL | Status: DC
  Administered 2015-11-18 – 2015-11-21 (×4): 1000 ug via ORAL
  Filled 2015-11-17 (×4): qty 1

## 2015-11-17 MED ORDER — FENTANYL CITRATE (PF) 100 MCG/2ML IJ SOLN
INTRAMUSCULAR | Status: AC
  Administered 2015-11-17: 100 ug via INTRAVENOUS
  Filled 2015-11-17: qty 2

## 2015-11-17 MED ORDER — FENTANYL CITRATE (PF) 100 MCG/2ML IJ SOLN
100.0000 ug | Freq: Once | INTRAMUSCULAR | Status: AC
  Administered 2015-11-17: 100 ug via INTRAVENOUS

## 2015-11-17 MED ORDER — MORPHINE SULFATE (PF) 2 MG/ML IV SOLN
INTRAVENOUS | Status: AC
  Administered 2015-11-17: 2 mg via INTRAVENOUS
  Filled 2015-11-17: qty 1

## 2015-11-17 MED ORDER — CLINDAMYCIN PHOSPHATE 600 MG/50ML IV SOLN
600.0000 mg | Freq: Once | INTRAVENOUS | Status: AC
  Administered 2015-11-18: 600 mg via INTRAVENOUS
  Filled 2015-11-17: qty 50

## 2015-11-17 MED ORDER — MORPHINE SULFATE (PF) 2 MG/ML IV SOLN: 2.0000 mg | INTRAVENOUS | Status: DC | PRN

## 2015-11-17 NOTE — ED Notes (Signed)
Patient transported to X-ray 

## 2015-11-17 NOTE — ED Notes (Addendum)
This RN and sarah blake NA attempted X2 unsuccessfully to place catheter, unable to obtain urine sample

## 2015-11-17 NOTE — H&P (Signed)
Kelsey Le is an 82 y.o. female.   Chief Complaint: Fall HPI: This is an 82 year old female who was just discharged yesterday from the hospital after undergoing treatment for a GI bleed. She was walking her dog earlier today and tripped over the curb and fell and has now been diagnosed with a right hip fracture. He denied any presyncopal symptoms.  Past Medical History:  Diagnosis Date  . Arrhythmia   . Asthma   . Broken back   . Broken wrist   . Broken wrist   . Fractured hip ([place])   . Heart murmur   . Syncope and collapse     Past Surgical History:  Procedure Laterality Date  . BACK SURGERY     back fusion  . ESOPHAGOGASTRODUODENOSCOPY (EGD) WITH PROPOFOL N/A 11/15/2015   Procedure: ESOPHAGOGASTRODUODENOSCOPY (EGD) WITH PROPOFOL;  Surgeon: Lucilla Lame, MD;  Location: [facility] ENDOSCOPY;  Service: Endoscopy;  Laterality: N/A;  . HIP FRACTURE SURGERY    . KYPHOPLASTY N/A 08/19/2014   Procedure: KYPHOPLASTY;  Surgeon: Hessie Knows, MD;  Location: [facility] ORS;  Service: Orthopedics;  Laterality: N/A;  . PARTIAL HYSTERECTOMY    . TUBAL LIGATION      Family History  Problem Relation Age of Onset  . Heart disease Sister   . Breast cancer Maternal Aunt    Social History:  reports that she has never smoked. She has never used smokeless tobacco. She reports that she does not drink alcohol or use drugs.  Allergies:  Allergies  Allergen Reactions  . Penicillins     antibiotics     (Not in a hospital admission)  Results for orders placed or performed during the hospital encounter of 11/17/15 (from the past 48 hour(s))  CBC with Differential/Platelet     Status: Abnormal   Collection Time: 11/17/15  6:11 PM  Result Value Ref Range   WBC 6.4 3.6 - 11.0 K/uL   RBC 3.29 (L) 3.80 - 5.20 MIL/uL   Hemoglobin 10.1 (L) 12.0 - 16.0 g/dL   HCT 30.3 (L) 35.0 - 47.0 %   MCV 92.1 80.0 - 100.0 fL   MCH 30.7 26.0 - 34.0 pg   MCHC 33.3 32.0 - 36.0 g/dL   RDW 15.7 (H) 11.5 - 14.5 %    Platelets 181 150 - 440 K/uL   Neutrophils Relative % 50 %   Neutro Abs 3.3 1.4 - 6.5 K/uL   Lymphocytes Relative 37 %   Lymphs Abs 2.4 1.0 - 3.6 K/uL   Monocytes Relative 9 %   Monocytes Absolute 0.5 0.2 - 0.9 K/uL   Eosinophils Relative 3 %   Eosinophils Absolute 0.2 0 - 0.7 K/uL   Basophils Relative 1 %   Basophils Absolute 0.1 0 - 0.1 K/uL  Comprehensive metabolic panel     Status: Abnormal   Collection Time: 11/17/15  6:11 PM  Result Value Ref Range   Sodium 141 135 - 145 mmol/L   Potassium 3.8 3.5 - 5.1 mmol/L   Chloride 108 101 - 111 mmol/L   CO2 23 22 - 32 mmol/L   Glucose, Bld 111 (H) 65 - 99 mg/dL   BUN 22 (H) 6 - 20 mg/dL   Creatinine, Ser 0.87 0.44 - 1.00 mg/dL   Calcium 8.8 (L) 8.9 - 10.3 mg/dL   Total Protein 6.1 (L) 6.5 - 8.1 g/dL   Albumin 3.8 3.5 - 5.0 g/dL   AST 34 15 - 41 U/L   ALT 16 14 - 54 U/L  Alkaline Phosphatase 60 38 - 126 U/L   Total Bilirubin 0.8 0.3 - 1.2 mg/dL   GFR calc non Af Amer 60 (L) >60 mL/min   GFR calc Af Amer >60 >60 mL/min    Comment: (NOTE) The eGFR has been calculated using the CKD EPI equation. This calculation has not been validated in all clinical situations. eGFR's persistently <60 mL/min signify possible Chronic Kidney Disease.    Anion gap 10 5 - 15  Type and screen [place]     Status: None   Collection Time: 11/17/15  6:11 PM  Result Value Ref Range   ABO/RH(D) A POS    Antibody Screen NEG    Sample Expiration 11/20/2015   Urinalysis complete, with microscopic ([facility] only)     Status: Abnormal   Collection Time: 11/17/15  8:09 PM  Result Value Ref Range   Color, Urine YELLOW (A) YELLOW   APPearance CLEAR (A) CLEAR   Glucose, UA NEGATIVE NEGATIVE mg/dL   Bilirubin Urine NEGATIVE NEGATIVE   Ketones, ur 1+ (A) NEGATIVE mg/dL   Specific Gravity, Urine 1.014 1.005 - 1.030   Hgb urine dipstick 1+ (A) NEGATIVE   pH 7.0 5.0 - 8.0   Protein, ur NEGATIVE NEGATIVE mg/dL   Nitrite NEGATIVE NEGATIVE    Leukocytes, UA NEGATIVE NEGATIVE   RBC / HPF 0-5 0 - 5 RBC/hpf   WBC, UA 0-5 0 - 5 WBC/hpf   Bacteria, UA NONE SEEN NONE SEEN   Squamous Epithelial / LPF 0-5 (A) NONE SEEN   Mucous PRESENT    Dg Chest 1 View  Result Date: 11/17/2015 CLINICAL DATA:  [place] today and fractured right hip. EXAM: CHEST 1 VIEW COMPARISON:  Chest x-ray 11/14/2015 FINDINGS: The heart is upper limits of normal in size and stable. Stable tortuosity and calcification of the thoracic aorta. Stable emphysematous changes. The lungs are clear. Scattered apical densities are stable and likely a combination of scar tissue in granulomas. The bony thorax is intact. IMPRESSION: No acute cardiopulmonary findings. No change since prior studies. Stable emphysematous changes. Electronically Signed   By: Marijo Sanes M.D.   On: 11/17/2015 18:59   Dg Hip Unilat W Or Wo Pelvis 2-3 Views Right  Result Date: 11/17/2015 CLINICAL DATA:  [place] today and injured right hip. EXAM: DG HIP (WITH OR WITHOUT PELVIS) 2-3V RIGHT COMPARISON:  None. FINDINGS: There is a displaced intertrochanteric fracture of the right hip with a varus deformity and mild displacement of the lesser trochanter. The pubic symphysis and SI joints are intact. No pelvic fractures. Remote left hip fractures are noted within intraedullary rod in place. IMPRESSION: Displaced and comminuted intertrochanteric fracture of the right hip. Electronically Signed   By: Marijo Sanes M.D.   On: 11/17/2015 18:53    Review of Systems  Constitutional: Negative for chills and fever.  HENT: Negative for hearing loss.   Eyes: Negative for blurred vision.  Respiratory: Negative for cough.   Cardiovascular: Negative for chest pain.  Gastrointestinal: Positive for blood in stool. Negative for nausea and vomiting.  Genitourinary: Negative for dysuria.  Musculoskeletal: Negative for joint pain.  Skin: Negative for rash.  Neurological: Negative for dizziness.    Blood pressure (!) 153/57,  pulse 81, temperature 98.1 F (36.7 C), temperature source Oral, resp. rate 18, height 5' 5"  (1.651 m), weight 54.4 kg (120 lb), SpO2 100 %. Physical Exam  Constitutional: She is oriented to person, place, and time. She appears well-developed and well-nourished. No distress.  HENT:  Head: Normocephalic and atraumatic.  Mouth/Throat: No oropharyngeal exudate.  Eyes: Pupils are equal, round, and reactive to light. Left eye exhibits no discharge. No scleral icterus.  Neck: Neck supple. No JVD present. No tracheal deviation present. No thyromegaly present.  Cardiovascular: Normal rate and regular rhythm.   No murmur heard. Regular rate and rhythm. 3/6 systolic murmur.  Respiratory: Effort normal and breath sounds normal. No respiratory distress.  GI: Soft. Bowel sounds are normal. There is no tenderness.  Musculoskeletal: She exhibits no edema or tenderness.  Lymphadenopathy:    She has no cervical adenopathy.  Neurological: She is alert and oriented to person, place, and time.  Skin: Skin is warm and dry.     Assessment/Plan 1. Right hip fracture. Patient is being admitted and orthopedics is being consulted. She will require surgical repair of the hip. She does have a history of moderate aortic stenosis and is followed by Dr. Fletcher Anon who recently did a repeat echocardiogram on her shows a stenosis is not any more severe. She has not had any recent chest pain. She did have a mildly elevated troponin during the hospital stay last week was diagnosed with demand ischemia in the setting of a low hemoglobin. At this point she is moderate risk for surgical repair of the hip. Discussed risk with patient and family and they understand. Recommend proceeding with the surgery.  2. Moderate aortic stenosis. This is been managed as an outpatient by her cardiologist. At this point would try to avoid large fluid shifts. So monitor fluids carefully pre-and postop.  3. Recent GI bleed. This was thought to be due  to gastric AVMs. She is on Protonix. Would avoid anticoagulants or antiplatelets at this point. We'll have to use other means for DVT prophylaxis postop.  4. Iron deficient anemia. She is on replacement therapy. She did receive 2 units of packed red blood cells last week with her recent GI bleed.  Total time spent was 45 minutes.  Baxter Hire, MD 11/17/2015, 8:50 PM

## 2015-11-17 NOTE — Consult Note (Signed)
ORTHOPAEDIC CONSULTATION  REQUESTING PHYSICIAN: Gracelyn NurseJohn D Johnston, MD  Chief Complaint: Right hip pain status post fall  HPI: Kelsey Le is a 82 y.o. female who complains of  right hip pain after fall earlier today while walking her dog. Patient had just been discharged last night from [facility] for treatment of a GI bleed. She is seen in her hospital room with her family at the bedside. Patient is complaining of right hip pain. Patient has a history of a previous intramedullary fixation for a left hip fracture with subsequent removal of the lag screw.     Past Medical History:  Diagnosis Date  . Arrhythmia   . Asthma   . Broken back   . Broken wrist   . Broken wrist   . Fractured hip ([facility])   . Heart murmur   . Syncope and collapse    Past Surgical History:  Procedure Laterality Date  . BACK SURGERY     back fusion  . ESOPHAGOGASTRODUODENOSCOPY (EGD) WITH PROPOFOL N/A 11/15/2015   Procedure: ESOPHAGOGASTRODUODENOSCOPY (EGD) WITH PROPOFOL;  Surgeon: Midge Miniumarren Wohl, MD;  Location: [facility] ENDOSCOPY;  Service: Endoscopy;  Laterality: N/A;  . HIP FRACTURE SURGERY    . KYPHOPLASTY N/A 08/19/2014   Procedure: KYPHOPLASTY;  Surgeon: Kennedy BuckerMichael Menz, MD;  Location: [facility] ORS;  Service: Orthopedics;  Laterality: N/A;  . PARTIAL HYSTERECTOMY    . TUBAL LIGATION     Social History   Social History  . Marital status: Married    Spouse name: N/A  . Number of children: N/A  . Years of education: N/A   Social History Main Topics  . Smoking status: Never Smoker  . Smokeless tobacco: Never Used  . Alcohol use No  . Drug use: No  . Sexual activity: Not Asked   Other Topics Concern  . None   Social History Narrative  . None   Family History  Problem Relation Age of Onset  . Heart disease Sister   . Breast cancer Maternal Aunt    Allergies  Allergen Reactions  . Penicillins     antibiotics   Prior to Admission medications   Medication Sig Start Date End Date Taking?  Authorizing Provider  acetaminophen (TYLENOL) 500 MG tablet Take 500 mg by mouth as needed.    Yes Historical Provider, MD  vitamin B-12 (CYANOCOBALAMIN) 1000 MCG tablet Take 1,000 mcg by mouth daily.   Yes Historical Provider, MD   Dg Chest 1 View  Result Date: 11/17/2015 CLINICAL DATA:  [place] SeatFell today and fractured right hip. EXAM: CHEST 1 VIEW COMPARISON:  Chest x-ray 11/14/2015 FINDINGS: The heart is upper limits of normal in size and stable. Stable tortuosity and calcification of the thoracic aorta. Stable emphysematous changes. The lungs are clear. Scattered apical densities are stable and likely a combination of scar tissue in granulomas. The bony thorax is intact. IMPRESSION: No acute cardiopulmonary findings. No change since prior studies. Stable emphysematous changes. Electronically Signed   By: Rudie MeyerP.  Gallerani M.D.   On: 11/17/2015 18:59   Dg Hip Unilat W Or Wo Pelvis 2-3 Views Right  Result Date: 11/17/2015 CLINICAL DATA:  [place]at[place]ell today and injured right hip. EXAM: DG HIP (WITH OR WITHOUT PELVIS) 2-3V RIGHT COMPARISON:  None. FINDINGS: There is a displaced intertrochanteric fracture of the right hip with a varus deformity and mild displacement of the lesser trochanter. The pubic symphysis and SI joints are intact. No pelvic fractures. Remote left hip fractures are noted within intraedullary rod in place. IMPRESSION:  Displaced and comminuted intertrochanteric fracture of the right hip. Electronically Signed   By: Rudie MeyerP.  Gallerani M.D.   On: 11/17/2015 18:53    Positive ROS: All other systems have been reviewed and were otherwise negative with the exception of those mentioned in the HPI and as above.  Physical Exam: General: Alert, no acute distress  MUSCULOSKELETAL: Right lower extremity: Patient has intact skin. There is no erythema or ecchymosis or swelling. Her thigh compartments are soft and compressible. She is TED stockings and foot pumps on. She has palpable pedal pulses, intact sensation  light touch and intact motor function distally. She can flex and extend her toes. She has shortening and external rotation the right lower extremity.  Assessment: Displaced right intertrochanteric hip fracture  Plan: I explained to the patient and her family that she has sustained an intertrochanteric hip fracture. I am recommending intramedullary fixation for this injury. I described the details of the operation as well as the postoperative course. Patient is familiar with this procedure having been through before.  I reviewed the risks and benefits of the procedure including infection, bleeding requiring blood transfusion, nerve or blood vessel injury, malunion, nonunion, persistent right hip pain, hardware failure, screw cut out, leg length discrepancy, change in lower extremity rotation and the need for further surgery. They understand medical risks include but are not limited to DVT and pulmonary embolism, myocardial infarction, stroke and pneumonia, respiratory failure and death.  They understood these risks and wished to proceed. I reviewed the patient's laboratory and radiographic studies in preparation for this case.  I have discussed the case with Dr. Letitia LibraJohnston who is cleared her for surgery.  He has recommended that she not receive postoperative anticoagulation due to her recent GI bleed. Patient be nothing by mouth after midnight.  Surgery is scheduled for tomorrow.    Juanell FairlyKRASINSKI, Tayja Manzer, MD    11/17/2015 11:30 PM

## 2015-11-17 NOTE — Progress Notes (Signed)
Chaplain responded to a page from ED in which a nurse stated that a daughter of patient was upset and crying due to her mother's situation. When chaplain arrived the daughter was devastated and crying. She said her mom had a DNR, but she was not ready to let go of her mother. Chaplain spent a quality time with her and prayed for her.

## 2015-11-17 NOTE — ED Triage Notes (Signed)
Pt BIB EMS, pt was walking dog and tripped, presents with R. Hip pain. Previous hip fracture on L side

## 2015-11-17 NOTE — ED Provider Notes (Signed)
[facility] Emergency Department Provider Note    First MD Initiated Contact with Patient 11/17/15 1751     (approximate)  I have reviewed the triage vital signs and the nursing notes.   HISTORY  Chief Complaint Hip Pain    HPI Kelsey SextonLouise C Santori is a 82 y.o. female with a reported history of osteoporosis presents with acute right hip pain after a mechanical fall from standing while walking Le dog today. Patient did not hit Le head and denies any loss of consciousness. She has good memory of the events preceding the fall and murmurs landing on the right hip with subsequent severe 10 out of 10 pain and inability to ambulate due to pain. Does not take any blood thinners. Does have a history of COPD.  She states that she's also been receiving outpatient transfusions for persistent symptomatic anemia.   Past Medical History:  Diagnosis Date  . Arrhythmia   . Asthma   . Broken back   . Broken wrist   . Broken wrist   . Fractured hip (HCC)   . Heart murmur   . Syncope and collapse    Family History  Problem Relation Age of Onset  . Heart disease Sister   . Breast cancer Maternal Aunt    Past Surgical History:  Procedure Laterality Date  . BACK SURGERY     back fusion  . ESOPHAGOGASTRODUODENOSCOPY (EGD) WITH PROPOFOL N/A 11/15/2015   Procedure: ESOPHAGOGASTRODUODENOSCOPY (EGD) WITH PROPOFOL;  Surgeon: Midge Miniumarren Wohl, MD;  Location: [facility] ENDOSCOPY;  Service: Endoscopy;  Laterality: N/A;  . HIP FRACTURE SURGERY    . KYPHOPLASTY N/A 08/19/2014   Procedure: KYPHOPLASTY;  Surgeon: Kennedy BuckerMichael Menz, MD;  Location: [facility] ORS;  Service: Orthopedics;  Laterality: N/A;  . PARTIAL HYSTERECTOMY    . TUBAL LIGATION     Patient Active Problem List   Diagnosis Date Noted  . Acute posthemorrhagic anemia 11/16/2015  . H/O transfusion of packed red blood cells 11/16/2015  . Elevated troponin 11/16/2015  . Severe aortic stenosis 11/16/2015  . Thrombocytopenia (HCC)  11/16/2015  . Generalized weakness 11/16/2015  . Melena 11/14/2015  . GI bleed 03/01/2014  . Aortic stenosis 07/27/2013  . Heart murmur 04/26/2013  . SOB (shortness of breath) 04/26/2013  . Elevated blood pressure reading without diagnosis of hypertension 04/26/2013      Prior to Admission medications   Medication Sig Start Date End Date Taking? Authorizing Provider  acetaminophen (TYLENOL) 500 MG tablet Take 500 mg by mouth as needed.     Historical Provider, MD  vitamin B-12 (CYANOCOBALAMIN) 1000 MCG tablet Take 1,000 mcg by mouth daily.    Historical Provider, MD    Allergies Penicillins    Social History Social History  Substance Use Topics  . Smoking status: Never Smoker  . Smokeless tobacco: Never Used  . Alcohol use No    Review of Systems Patient denies headaches, rhinorrhea, blurry vision, numbness, shortness of breath, chest pain, edema, cough, abdominal pain, nausea, vomiting, diarrhea, dysuria, fevers, rashes or hallucinations unless otherwise stated above in HPI. ____________________________________________   PHYSICAL EXAM:  VITAL SIGNS: Vitals:   11/17/15 1749  BP: (!) 153/104  Pulse: 90  Resp: 18  Temp: 98.1 F (36.7 C)    Constitutional: Alert and oriented. Very uncomfortable appearing laying in stretcher Eyes: Conjunctivae are normal. PERRL. EOMI. Head: Atraumatic. Nose: No congestion/rhinnorhea. Mouth/Throat: Mucous membranes are moist.  Oropharynx non-erythematous. Neck: No stridor. Painless ROM. No cervical spine tenderness to palpation Hematological/Lymphatic/Immunilogical:  No cervical lymphadenopathy. Cardiovascular: Normal rate, regular rhythm. Grossly normal heart sounds.  Good peripheral circulation. Respiratory: Normal respiratory effort.  No retractions. Lungs CTAB. Gastrointestinal: Soft and nontender. No distention. No abdominal bruits. No CVA tenderness. Musculoskeletal:severe ttp of right hip with log roll.  No knee or distal  ttp.  No joint effusions. Neurologic:  Normal speech and language. No gross focal neurologic deficits are appreciated. No gait instability. Skin:  Skin is warm, dry and intact. No rash noted. Psychiatric: Mood and affect are normal. Speech and behavior are normal.  ____________________________________________   LABS (all labs ordered are listed, but only abnormal results are displayed)  Results for orders placed or performed during the hospital encounter of 11/14/15 (from the past 24 hour(s))  BLOOD TRANSFUSION REPORT - SCANNED     Status: None   Collection Time: 11/17/15 10:20 AM   Narrative   Ordered by an unspecified provider.   ____________________________________________  EKG My review and personal interpretation at Time: 18:51   Indication: fall  Rate: 80  Rhythm: sinus Axis: normal Other: non specific st changes with <1 mm depression in V6 ____________________________________________  RADIOLOGY  I personally reviewed all radiographic images ordered to evaluate for the above acute complaints and reviewed radiology reports and findings.  These findings were personally discussed with the patient.  Please see medical record for radiology report. ____________________________________________   PROCEDURES  Procedure(s) performed: none Procedures    Critical Care performed:  ____________________________________________   INITIAL IMPRESSION / ASSESSMENT AND PLAN / ED COURSE  Pertinent labs & imaging results that were available during my care of the patient were reviewed by me and considered in my medical decision making (see chart for details).  DDX: fracture, dislocation, contusion, arthritis  Kelsey Le is a 82 y.o. who presents to the ED with ffs and subsequent right hip injury.  Patient is AFVSS in ED. Exam as above. Given current presentation have considered the above differential. Denies any other injuries. Denies head injury or neck pain/injury. Denies motor  or sensory loss in extremity. VSS in ED. Exam as above. NV intact throughout and distal to injury. 2+ distal pulses. Exam clinically consistent with hip fracture.  No clinical suspicion for infectious process or septic joint. Getting X-rays to r/o fracture. No other injuries reported or noted on exam.  X-rays with evidence of acute right comminuted intertrochanteric fracture.  Patient will require admission for further evaluation and monitoring.  Have discussed with the patient and available family all diagnostics and treatments performed thus far and all questions were answered to the best of my ability. The patient demonstrates understanding and agreement with plan.    Clinical Course      ____________________________________________   FINAL CLINICAL IMPRESSION(S) / ED DIAGNOSES  Final diagnoses:  Fall from standing, initial encounter  Closed displaced intertrochanteric fracture of right femur, initial encounter (HCC)      NEW MEDICATIONS STARTED DURING THIS VISIT:  New Prescriptions   No medications on file     Note:  This document was prepared using Dragon voice recognition software and may include unintentional dictation errors.    Willy EddyPatrick Arleene Settle, MD 11/17/15 2129

## 2015-11-18 ENCOUNTER — Inpatient Hospital Stay: Payer: Medicare Other | Admitting: Anesthesiology

## 2015-11-18 ENCOUNTER — Encounter: Payer: Self-pay | Admitting: Anesthesiology

## 2015-11-18 ENCOUNTER — Inpatient Hospital Stay: Payer: Medicare Other

## 2015-11-18 ENCOUNTER — Encounter
Admission: EM | Disposition: A | Discharge status: Skilled Nursing Facility | Payer: Self-pay | Source: Home / Self Care | Attending: Internal Medicine

## 2015-11-18 HISTORY — PX: INTRAMEDULLARY (IM) NAIL INTERTROCHANTERIC: SHX5875

## 2015-11-18 LAB — CBC
HCT: 24.9 % — ABNORMAL LOW (ref 35.0–47.0)
HCT: 27.5 % — ABNORMAL LOW (ref 35.0–47.0)
Hemoglobin: 8.4 g/dL — ABNORMAL LOW (ref 12.0–16.0)
Hemoglobin: 9.4 g/dL — ABNORMAL LOW (ref 12.0–16.0)
MCH: 31 pg (ref 26.0–34.0)
MCH: 31.4 pg (ref 26.0–34.0)
MCHC: 33.9 g/dL (ref 32.0–36.0)
MCHC: 34.1 g/dL (ref 32.0–36.0)
MCV: 91.5 fL (ref 80.0–100.0)
MCV: 92.3 fL (ref 80.0–100.0)
PLATELETS: 156 10*3/uL (ref 150–440)
PLATELETS: 142 10*3/uL — AB (ref 150–440)
RBC: 2.72 MIL/uL — AB (ref 3.80–5.20)
RBC: 2.98 MIL/uL — AB (ref 3.80–5.20)
RDW: 15.2 % — ABNORMAL HIGH (ref 11.5–14.5)
RDW: 16.4 % — ABNORMAL HIGH (ref 11.5–14.5)
WBC: 7.8 10*3/uL (ref 3.6–11.0)
WBC: 10.8 10*3/uL (ref 3.6–11.0)

## 2015-11-18 LAB — BASIC METABOLIC PANEL
Anion gap: 6 (ref 5–15)
BUN: 19 mg/dL (ref 6–20)
CALCIUM: 8.1 mg/dL — AB (ref 8.9–10.3)
CO2: 24 mmol/L (ref 22–32)
CREATININE: 0.73 mg/dL (ref 0.44–1.00)
Chloride: 110 mmol/L (ref 101–111)
GFR calc Af Amer: >60 mL/min (ref >60)
Glucose, Bld: 106 mg/dL — ABNORMAL HIGH (ref 65–99)
POTASSIUM: 3.9 mmol/L (ref 3.5–5.1)
SODIUM: 140 mmol/L (ref 135–145)

## 2015-11-18 LAB — PREPARE RBC (CROSSMATCH)

## 2015-11-18 LAB — MRSA PCR SCREENING: MRSA BY PCR: NEGATIVE

## 2015-11-18 SURGERY — FIXATION, FRACTURE, INTERTROCHANTERIC, WITH INTRAMEDULLARY ROD
Anesthesia: General | Site: Hip | Laterality: Right | Wound class: Clean

## 2015-11-18 MED ORDER — FENTANYL CITRATE (PF) 100 MCG/2ML IJ SOLN
INTRAMUSCULAR | Status: DC | PRN
  Administered 2015-11-18: 75 ug via INTRAVENOUS
  Administered 2015-11-18: 25 ug via INTRAVENOUS

## 2015-11-18 MED ORDER — SODIUM CHLORIDE 0.9 % IV SOLN
75.0000 mL/h | INTRAVENOUS | Status: DC
  Administered 2015-11-18: 75 mL/h via INTRAVENOUS

## 2015-11-18 MED ORDER — DEXAMETHASONE SODIUM PHOSPHATE 10 MG/ML IJ SOLN
INTRAMUSCULAR | Status: DC | PRN
  Administered 2015-11-18: 4 mg via INTRAVENOUS

## 2015-11-18 MED ORDER — PHENYLEPHRINE HCL 10 MG/ML IJ SOLN
INTRAMUSCULAR | Status: DC | PRN
  Administered 2015-11-18 (×3): 50 ug via INTRAVENOUS

## 2015-11-18 MED ORDER — ACETAMINOPHEN 325 MG PO TABS
650.0000 mg | ORAL_TABLET | Freq: Once | ORAL | Status: AC
  Administered 2015-11-18: 650 mg via ORAL
  Filled 2015-11-18: qty 2

## 2015-11-18 MED ORDER — ONDANSETRON HCL 4 MG/2ML IJ SOLN: 4.0000 mg | Freq: Four times a day (QID) | INTRAMUSCULAR | Status: DC | PRN

## 2015-11-18 MED ORDER — FENTANYL CITRATE (PF) 100 MCG/2ML IJ SOLN
INTRAMUSCULAR | Status: AC
  Filled 2015-11-18: qty 2

## 2015-11-18 MED ORDER — ACETAMINOPHEN 325 MG PO TABS
650.0000 mg | ORAL_TABLET | Freq: Four times a day (QID) | ORAL | Status: DC | PRN
  Administered 2015-11-19: 650 mg via ORAL
  Filled 2015-11-18: qty 2

## 2015-11-18 MED ORDER — OXYCODONE HCL 5 MG/5ML PO SOLN: 5.0000 mg | Freq: Once | ORAL | Status: DC | PRN

## 2015-11-18 MED ORDER — MEPERIDINE HCL 25 MG/ML IJ SOLN: 6.2500 mg | INTRAMUSCULAR | Status: DC | PRN

## 2015-11-18 MED ORDER — PROPOFOL 10 MG/ML IV BOLUS
INTRAVENOUS | Status: DC | PRN
  Administered 2015-11-18: 80 mg via INTRAVENOUS

## 2015-11-18 MED ORDER — DOCUSATE SODIUM 100 MG PO CAPS
100.0000 mg | ORAL_CAPSULE | Freq: Two times a day (BID) | ORAL | Status: DC
  Administered 2015-11-18 – 2015-11-21 (×6): 100 mg via ORAL
  Filled 2015-11-18 (×6): qty 1

## 2015-11-18 MED ORDER — ROCURONIUM BROMIDE 100 MG/10ML IV SOLN
INTRAVENOUS | Status: DC | PRN
  Administered 2015-11-18: 10 mg via INTRAVENOUS
  Administered 2015-11-18: 30 mg via INTRAVENOUS

## 2015-11-18 MED ORDER — ESMOLOL HCL 100 MG/10ML IV SOLN
INTRAVENOUS | Status: DC | PRN
  Administered 2015-11-18 (×2): 10 mg via INTRAVENOUS

## 2015-11-18 MED ORDER — ONDANSETRON HCL 4 MG/2ML IJ SOLN
INTRAMUSCULAR | Status: DC | PRN
  Administered 2015-11-18: 4 mg via INTRAVENOUS

## 2015-11-18 MED ORDER — OXYCODONE HCL 5 MG PO TABS: 5.0000 mg | ORAL_TABLET | Freq: Once | ORAL | Status: DC | PRN

## 2015-11-18 MED ORDER — LACTATED RINGERS IV SOLN
INTRAVENOUS | Status: DC | PRN
  Administered 2015-11-18: 13:00:00 via INTRAVENOUS

## 2015-11-18 MED ORDER — MORPHINE SULFATE (PF) 2 MG/ML IV SOLN
2.0000 mg | INTRAVENOUS | Status: DC | PRN
  Administered 2015-11-19: 2 mg via INTRAVENOUS
  Filled 2015-11-18: qty 1

## 2015-11-18 MED ORDER — FERROUS SULFATE 325 (65 FE) MG PO TABS
325.0000 mg | ORAL_TABLET | Freq: Three times a day (TID) | ORAL | Status: DC
  Administered 2015-11-19 – 2015-11-21 (×7): 325 mg via ORAL
  Filled 2015-11-18 (×7): qty 1

## 2015-11-18 MED ORDER — PROMETHAZINE HCL 25 MG/ML IJ SOLN: 6.2500 mg | INTRAMUSCULAR | Status: DC | PRN

## 2015-11-18 MED ORDER — BISACODYL 10 MG RE SUPP
10.0000 mg | Freq: Every day | RECTAL | Status: DC | PRN
  Administered 2015-11-20: 10 mg via RECTAL
  Filled 2015-11-18: qty 1

## 2015-11-18 MED ORDER — MAGNESIUM CITRATE PO SOLN
1.0000 | Freq: Once | ORAL | Status: DC | PRN
Start: 2015-11-18 — End: 2015-11-21
  Filled 2015-11-18: qty 296

## 2015-11-18 MED ORDER — FENTANYL CITRATE (PF) 100 MCG/2ML IJ SOLN
25.0000 ug | INTRAMUSCULAR | Status: DC | PRN
  Administered 2015-11-18 (×2): 25 ug via INTRAVENOUS
  Administered 2015-11-18: 50 ug via INTRAVENOUS
  Administered 2015-11-18: 25 ug via INTRAVENOUS

## 2015-11-18 MED ORDER — LACTATED RINGERS IV SOLN
INTRAVENOUS | Status: DC | PRN
  Administered 2015-11-18: 14:00:00 via INTRAVENOUS

## 2015-11-18 MED ORDER — METHOCARBAMOL 500 MG PO TABS
500.0000 mg | ORAL_TABLET | Freq: Four times a day (QID) | ORAL | Status: DC | PRN
  Administered 2015-11-18 – 2015-11-19 (×2): 500 mg via ORAL
  Filled 2015-11-18 (×2): qty 1

## 2015-11-18 MED ORDER — SUGAMMADEX SODIUM 500 MG/5ML IV SOLN
INTRAVENOUS | Status: DC | PRN
  Administered 2015-11-18: 108.8 mg via INTRAVENOUS

## 2015-11-18 MED ORDER — NEOMYCIN-POLYMYXIN B GU 40-200000 IR SOLN
Status: AC
  Filled 2015-11-18: qty 4

## 2015-11-18 MED ORDER — GLYCOPYRROLATE 0.2 MG/ML IJ SOLN
INTRAMUSCULAR | Status: DC | PRN
  Administered 2015-11-18: 0.2 mg via INTRAVENOUS

## 2015-11-18 MED ORDER — MENTHOL 3 MG MT LOZG
1.0000 | LOZENGE | OROMUCOSAL | Status: DC | PRN
  Administered 2015-11-20: 3 mg via ORAL
  Filled 2015-11-18 (×2): qty 9

## 2015-11-18 MED ORDER — HYDROCODONE-ACETAMINOPHEN 5-325 MG PO TABS
1.0000 | ORAL_TABLET | Freq: Four times a day (QID) | ORAL | Status: DC | PRN
  Administered 2015-11-18 – 2015-11-19 (×3): 1 via ORAL
  Administered 2015-11-20 – 2015-11-21 (×3): 2 via ORAL
  Filled 2015-11-18 (×2): qty 1
  Filled 2015-11-18: qty 2
  Filled 2015-11-18: qty 1
  Filled 2015-11-18 (×2): qty 2

## 2015-11-18 MED ORDER — ONDANSETRON HCL 4 MG PO TABS: 4.0000 mg | ORAL_TABLET | Freq: Four times a day (QID) | ORAL | Status: DC | PRN

## 2015-11-18 MED ORDER — SENNA 8.6 MG PO TABS
1.0000 | ORAL_TABLET | Freq: Two times a day (BID) | ORAL | Status: DC
  Administered 2015-11-18 – 2015-11-21 (×6): 8.6 mg via ORAL
  Filled 2015-11-18 (×6): qty 1

## 2015-11-18 MED ORDER — LIDOCAINE HCL (CARDIAC) 20 MG/ML IV SOLN
INTRAVENOUS | Status: DC | PRN
  Administered 2015-11-18: 60 mg via INTRAVENOUS

## 2015-11-18 MED ORDER — PHENOL 1.4 % MT LIQD
1.0000 | OROMUCOSAL | Status: DC | PRN
  Filled 2015-11-18: qty 177

## 2015-11-18 MED ORDER — METHOCARBAMOL 1000 MG/10ML IJ SOLN
500.0000 mg | Freq: Four times a day (QID) | INTRAVENOUS | Status: DC | PRN
  Filled 2015-11-18: qty 5

## 2015-11-18 MED ORDER — SODIUM CHLORIDE 0.9 % IV SOLN
Freq: Once | INTRAVENOUS | Status: AC
  Administered 2015-11-18: 11:00:00 via INTRAVENOUS

## 2015-11-18 MED ORDER — ALUM & MAG HYDROXIDE-SIMETH 200-200-20 MG/5ML PO SUSP: 30.0000 mL | ORAL | Status: DC | PRN

## 2015-11-18 MED ORDER — DIPHENHYDRAMINE HCL 25 MG PO CAPS
25.0000 mg | ORAL_CAPSULE | Freq: Once | ORAL | Status: AC
  Administered 2015-11-18: 25 mg via ORAL
  Filled 2015-11-18: qty 1

## 2015-11-18 MED ORDER — CLINDAMYCIN PHOSPHATE 600 MG/50ML IV SOLN
600.0000 mg | Freq: Four times a day (QID) | INTRAVENOUS | Status: AC
  Administered 2015-11-18 – 2015-11-19 (×3): 600 mg via INTRAVENOUS
  Filled 2015-11-18 (×3): qty 50

## 2015-11-18 MED ORDER — NEOMYCIN-POLYMYXIN B GU 40-200000 IR SOLN
Status: DC | PRN
  Administered 2015-11-18: 4 mL

## 2015-11-18 MED ORDER — POLYETHYLENE GLYCOL 3350 17 G PO PACK: 17.0000 g | PACK | Freq: Every day | ORAL | Status: DC | PRN

## 2015-11-18 MED ORDER — ESMOLOL HCL 100 MG/10ML IV SOLN
INTRAVENOUS | Status: AC
  Filled 2015-11-18: qty 10

## 2015-11-18 MED ORDER — ACETAMINOPHEN 650 MG RE SUPP: 650.0000 mg | Freq: Four times a day (QID) | RECTAL | Status: DC | PRN

## 2015-11-18 SURGICAL SUPPLY — 38 items
BIT DRILL 4.3MMS DISTAL GRDTED (BIT) ×1 IMPLANT
BNDG COHESIVE 6X5 TAN STRL LF (GAUZE/BANDAGES/DRESSINGS) ×6 IMPLANT
CANISTER SUCT 1200ML W/VALVE (MISCELLANEOUS) ×3 IMPLANT
DRAPE SHEET LG 3/4 BI-LAMINATE (DRAPES) ×6 IMPLANT
DRAPE SURG 17X11 SM STRL (DRAPES) ×6 IMPLANT
DRAPE U-SHAPE 47X51 STRL (DRAPES) ×3 IMPLANT
DRILL 4.3MMS DISTAL GRADUATED (BIT) ×3
DRSG OPSITE POSTOP 4X14 (GAUZE/BANDAGES/DRESSINGS) ×3 IMPLANT
DURAPREP 26ML APPLICATOR (WOUND CARE) ×6 IMPLANT
ELECT REM PT RETURN 9FT ADLT (ELECTROSURGICAL) ×3
ELECTRODE REM PT RTRN 9FT ADLT (ELECTROSURGICAL) ×1 IMPLANT
GLOVE BIOGEL PI IND STRL 9 (GLOVE) ×1 IMPLANT
GLOVE BIOGEL PI INDICATOR 9 (GLOVE) ×2
GLOVE SURG 9.0 ORTHO LTXF (GLOVE) ×6 IMPLANT
GOWN STRL REUS TWL 2XL XL LVL4 (GOWN DISPOSABLE) ×3 IMPLANT
GOWN STRL REUS W/ TWL LRG LVL3 (GOWN DISPOSABLE) ×1 IMPLANT
GOWN STRL REUS W/TWL LRG LVL3 (GOWN DISPOSABLE) ×2
GUIDEPIN VERSANAIL DSP 3.2X444 ×3 IMPLANT
GUIDEWIRE BALL NOSE 80CM (WIRE) ×3 IMPLANT
HEMOVAC 400CC 10FR (MISCELLANEOUS) ×3 IMPLANT
HFN RH 130 DEG 11MM X 360MM (Orthopedic Implant) ×6 IMPLANT
HIP FRAC NAIL LEFT 11X360MM (Orthopedic Implant) IMPLANT
KIT RM TURNOVER CYSTO AR (KITS) ×3 IMPLANT
MAT BLUE FLOOR 46X72 FLO (MISCELLANEOUS) ×3 IMPLANT
NAIL HIP FRAC LEFT 11X360MM (Orthopedic Implant) IMPLANT
NS IRRIG 1000ML POUR BTL (IV SOLUTION) ×3 IMPLANT
PACK HIP COMPR (MISCELLANEOUS) ×3 IMPLANT
SCREW BONE CORTICAL 5.0X42 (Screw) ×3 IMPLANT
SCREW BONE CORTICAL 5.0X44 (Screw) ×3 IMPLANT
SCREW LAG 10.5MMX105MM HFN (Screw) ×3 IMPLANT
STAPLER SKIN PROX 35W (STAPLE) ×3 IMPLANT
SUCTION FRAZIER HANDLE 10FR (MISCELLANEOUS) ×2
SUCTION TUBE FRAZIER 10FR DISP (MISCELLANEOUS) ×1 IMPLANT
SUT VIC AB 0 CT1 36 (SUTURE) ×6 IMPLANT
SUT VIC AB 2-0 CT1 27 (SUTURE) ×2
SUT VIC AB 2-0 CT1 TAPERPNT 27 (SUTURE) ×1 IMPLANT
SUT VICRYL 0 AB UR-6 (SUTURE) ×3 IMPLANT
SYR 30ML LL (SYRINGE) ×3 IMPLANT

## 2015-11-18 NOTE — Progress Notes (Signed)
Patient ID: [name] Zuercher, female   DOB: May 29, 1930, 82 y.o.   MRN: [id number]  [employer] PROGRESS NOTE  [name] Agner WUJ:[id number] DOB: May 29, 1930 DOA: 11/17/2015 PCP: Marguarite ArbourSPARKS,JEFFREY D, MD  HPI/Subjective: Patient seen this morning right before taken to surgery. Patient has some pain in the right hip. Otherwise offers no complaints. She states that she was walking the dog and fell over the curb. No recent bleeding.  Objective: Vitals:   11/18/15 1630 11/18/15 1645  BP: 134/75 (!) 142/55  Pulse: (!) 139 (!) 102  Resp: 19 14  Temp: 99.5 F (37.5 C)     Filed Weights   11/17/15 1747 11/17/15 2300 11/18/15 1200  Weight: 54.4 kg (120 lb) 54.7 kg (120 lb 8 oz) 54.4 kg (120 lb)    ROS: Review of Systems  Constitutional: Negative for chills and fever.  Eyes: Negative for blurred vision.  Respiratory: Negative for cough and shortness of breath.   Cardiovascular: Negative for chest pain.  Gastrointestinal: Negative for abdominal pain, constipation, diarrhea, nausea and vomiting.  Genitourinary: Negative for dysuria.  Musculoskeletal: Positive for joint pain.  Neurological: Negative for dizziness and headaches.   Exam: Physical Exam  Constitutional: She is oriented to person, place, and time.  HENT:  Nose: No mucosal edema.  Mouth/Throat: No oropharyngeal exudate or posterior oropharyngeal edema.  Eyes: Conjunctivae, EOM and lids are normal. Pupils are equal, round, and reactive to light.  Neck: No JVD present. Carotid bruit is not present. No edema present. No thyroid mass and no thyromegaly present.  Cardiovascular: S1 normal and S2 normal.  Exam reveals no gallop.   No murmur heard. Pulses:      Dorsalis pedis pulses are 2+ on the right side, and 2+ on the left side.  Respiratory: No respiratory distress. She has no wheezes. She has no rhonchi. She has no rales.  GI: Soft. Bowel sounds are normal. There is no tenderness.  Musculoskeletal:       Right ankle: She  exhibits no swelling.       Left ankle: She exhibits no swelling.  Lymphadenopathy:    She has no cervical adenopathy.  Neurological: She is alert and oriented to person, place, and time. No cranial nerve deficit.  Skin: Skin is warm. No rash noted. Nails show no clubbing.  Psychiatric: She has a normal mood and affect.      Data Reviewed: Basic Metabolic Panel:  Recent Labs Lab 11/14/15 1550 11/17/15 1811 11/18/15 0421  NA 140 141 140  K 3.6 3.8 3.9  CL 112* 108 110  CO2 23 23 24   GLUCOSE 87 111* 106*  BUN 39* 22* 19  CREATININE 0.88 0.87 0.73  CALCIUM 8.6* 8.8* 8.1*   Liver Function Tests:  Recent Labs Lab 11/14/15 1550 11/17/15 1811  AST 28 34  ALT 13* 16  ALKPHOS 48 60  BILITOT 0.6 0.8  PROT 5.7* 6.1*  ALBUMIN 3.4* 3.8   No results for input(s): LIPASE, AMYLASE in the last 168 hours. No results for input(s): AMMONIA in the last 168 hours. CBC:  Recent Labs Lab 11/14/15 1550 11/15/15 0516 11/16/15 0518 11/16/15 1600 11/17/15 1811 11/18/15 0421  WBC 4.6 5.3  --   --  6.4 7.8  NEUTROABS  --   --   --   --  3.3  --   HGB 6.3* 8.6* 10.0* 10.6* 10.1* 8.4*  HCT 19.0* 24.9*  --   --  30.3* 24.9*  MCV 93.8 89.6  --   --  92.1 91.5  PLT 167 128*  --  173 181 156   Cardiac Enzymes:  Recent Labs Lab 11/14/15 1550 11/15/15 0516  TROPONINI 0.03* 0.07*     Recent Results (from the past 240 hour(s))  MRSA PCR Screening     Status: None   Collection Time: 11/17/15 10:59 PM  Result Value Ref Range Status   MRSA by PCR NEGATIVE NEGATIVE Final    Comment:        The GeneXpert MRSA Assay (FDA approved for NASAL specimens only), is one component of a comprehensive MRSA colonization surveillance program. It is not intended to diagnose MRSA infection nor to guide or monitor treatment for MRSA infections.      Studies: Dg Chest 1 View  Result Date: 11/17/2015 CLINICAL DATA:  Larey Seat today and fractured right hip. EXAM: CHEST 1 VIEW COMPARISON:   Chest x-ray 11/14/2015 FINDINGS: The heart is upper limits of normal in size and stable. Stable tortuosity and calcification of the thoracic aorta. Stable emphysematous changes. The lungs are clear. Scattered apical densities are stable and likely a combination of scar tissue in granulomas. The bony thorax is intact. IMPRESSION: No acute cardiopulmonary findings. No change since prior studies. Stable emphysematous changes. Electronically Signed   By: Rudie Meyer M.D.   On: 11/17/2015 18:59   Dg Hip Operative Unilat W Or W/o Pelvis Right  Result Date: 11/18/2015 CLINICAL DATA:  Right intra medullary nail EXAM: OPERATIVE RIGHT HIP (WITH PELVIS IF PERFORMED) 4 VIEWS TECHNIQUE: Fluoroscopic spot image(s) were submitted for interpretation post-operatively. COMPARISON:  11/17/2015 FINDINGS: Multiple intraoperative spot images demonstrate placement of hip screw and intra medullary nail across the right femoral intertrochanteric fracture. Near anatomic alignment. No hardware complicating feature. IMPRESSION: Internal fixation.  No visible complicating feature. Electronically Signed   By: Charlett Nose M.D.   On: 11/18/2015 15:30   Dg Hip Unilat W Or Wo Pelvis 2-3 Views Right  Result Date: 11/17/2015 CLINICAL DATA:  Larey Seat today and injured right hip. EXAM: DG HIP (WITH OR WITHOUT PELVIS) 2-3V RIGHT COMPARISON:  None. FINDINGS: There is a displaced intertrochanteric fracture of the right hip with a varus deformity and mild displacement of the lesser trochanter. The pubic symphysis and SI joints are intact. No pelvic fractures. Remote left hip fractures are noted within intraedullary rod in place. IMPRESSION: Displaced and comminuted intertrochanteric fracture of the right hip. Electronically Signed   By: Rudie Meyer M.D.   On: 11/17/2015 18:53    Scheduled Meds: . fentaNYL      . fentaNYL      . [MAR Hold] pantoprazole  40 mg Oral BID AC  . [MAR Hold] sodium chloride flush  3 mL Intravenous Q12H  . [MAR  Hold] vitamin B-12  1,000 mcg Oral Daily   Continuous Infusions: . sodium chloride 75 mL/hr at 11/17/15 2309    Assessment/Plan:  1. Moderate aortic stenosis. No contraindications to surgery. Surgery must be done on a weightbearing bone. 2. 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 Avoid anticoagulation. Patient receiving 1 unit of blood prior to operation today.Continue Protonix. 3. Iron deficiency anemia. Receiving a unit of blood today preop 4. Right hip fracture requiring operative repair initial evaluation of closed fracture.Will need physical therapy and likely rehabilitation postoperative day 3.  Code Status:     Code Status Orders        Start     Ordered   11/17/15 2252  Full code  Continuous     11/17/15 2251  Code Status History    Date Active Date Inactive Code Status Order ID Comments User Context   11/17/2015 10:51 PM 11/17/2015 10:51 PM Full Code [id number]  Gracelyn NurseJohn D Johnston, MD Inpatient   11/14/2015  7:19 PM 11/16/2015  8:25 PM Full Code [id number]  Enedina FinnerSona Patel, MD Inpatient   08/19/2014  2:52 PM 08/19/2014  6:42 PM Full Code [id number]  Kennedy BuckerMichael Menz, MD Inpatient     Family Communication: spoke with family preop Disposition Plan: will need rehabilitation postoperative day 3  Consultants:  Orthopedic surgery  Time spent: 25 minutes  Alford HighlandWIETING, Marialice Newkirk  [employer]

## 2015-11-18 NOTE — Anesthesia Procedure Notes (Signed)
Procedure Name: Intubation Performed by: Casey BurkittHOANG, Katriona Schmierer Pre-anesthesia Checklist: Patient identified, Patient being monitored, Timeout performed, Emergency Drugs available and Suction available Patient Re-evaluated:Patient Re-evaluated prior to inductionOxygen Delivery Method: Circle system utilized Preoxygenation: Pre-oxygenation with 100% oxygen Intubation Type: IV induction Ventilation: Mask ventilation without difficulty Laryngoscope Size: Mac and 3 Grade View: Grade II Tube type: Oral Tube size: 7.0 mm Number of attempts: 2 Airway Equipment and Method: Stylet Placement Confirmation: ETT inserted through vocal cords under direct vision,  positive ETCO2 and breath sounds checked- equal and bilateral Secured at: 20 cm Tube secured with: Tape Dental Injury: Teeth and Oropharynx as per pre-operative assessment

## 2015-11-18 NOTE — Anesthesia Preprocedure Evaluation (Addendum)
Anesthesia Evaluation  Patient identified by MRN, date of birth, ID band Patient awake    Reviewed: Allergy & Precautions, NPO status , Patient's Chart, lab work & pertinent test results  Airway Mallampati: II       Dental  (+) Caps   Pulmonary shortness of breath and with exertion, asthma ,    Pulmonary exam normal breath sounds clear to auscultation       Cardiovascular Normal cardiovascular exam+ dysrhythmias + Valvular Problems/Murmurs AS      Neuro/Psych negative neurological ROS  negative psych ROS   GI/Hepatic negative GI ROS, Neg liver ROS,   Endo/Other  negative endocrine ROS  Renal/GU negative Renal ROS  negative genitourinary   Musculoskeletal Fx of hip   Abdominal Normal abdominal exam  (+)   Peds negative pediatric ROS (+)  Hematology  (+) anemia ,   Anesthesia Other Findings Past Medical History: No date: Arrhythmia No date: Asthma No date: Broken back No date: Broken wrist No date: Broken wrist No date: Fractured hip (HCC) No date: Heart murmur No date: Syncope and collapse  Reproductive/Obstetrics                            Anesthesia Physical  Anesthesia Plan  ASA: III  Anesthesia Plan: General   Post-op Pain Management:    Induction: Intravenous  Airway Management Planned: Oral ETT  Additional Equipment:   Intra-op Plan:   Post-operative Plan: Extubation in OR  Informed Consent:   Dental advisory given  Plan Discussed with: CRNA and Surgeon  Anesthesia Plan Comments:         Anesthesia Quick Evaluation

## 2015-11-18 NOTE — Progress Notes (Signed)
Npo for surgery this am . Medicated for pain x2. CHG bath done. Foley wasn't inserted on unit because two unsuccessful attempts was made in the ED. Dr Martha ClanKrasinski said it was ok to wait until pt goes to OR

## 2015-11-18 NOTE — Progress Notes (Signed)
Blood started at 1049. Education discussed with patient and family at bedside. No acute distress noted.

## 2015-11-18 NOTE — Anesthesia Postprocedure Evaluation (Signed)
Anesthesia Post Note  Patient: Kelsey Le  Procedure(s) Performed: Procedure(s) (LRB): INTRAMEDULLARY (IM) NAIL INTERTROCHANTRIC (Right)  Patient location during evaluation: PACU Anesthesia Type: General Level of consciousness: awake and alert Pain management: pain level controlled Vital Signs Assessment: post-procedure vital signs reviewed and stable Respiratory status: spontaneous breathing, nonlabored ventilation and respiratory function stable Cardiovascular status: blood pressure returned to baseline and stable Postop Assessment: no signs of nausea or vomiting Anesthetic complications: no    Last Vitals:  Vitals:   11/18/15 1630 11/18/15 1645  BP: 134/75 (!) 142/55  Pulse: (!) 139 (!) 102  Resp: 19 14  Temp: 37.5 C     Last Pain:  Vitals:   11/18/15 1630  TempSrc:   PainSc: Asleep                 Federick Levene

## 2015-11-18 NOTE — Transfer of Care (Signed)
Immediate Anesthesia Transfer of Care Note  Patient: [name] Thone  Procedure(s) Performed: Procedure(s): INTRAMEDULLARY (IM) NAIL INTERTROCHANTRIC (Right)  Patient Location: PACU  Anesthesia Type:General  Level of Consciousness: sedated  Airway & Oxygen Therapy: Patient Spontanous Breathing and Patient connected to nasal cannula oxygen  Post-op Assessment: Report given to RN and Post -op Vital signs reviewed and stable  Post vital signs: Reviewed and stable  Last Vitals:  Vitals:   11/18/15 1104 11/18/15 1200  BP: 132/60 (!) 128/50  Pulse: 84 91  Resp: 18 18  Temp: 37.2 C 37.6 C    Last Pain:  Vitals:   11/18/15 1200  TempSrc: Tympanic  PainSc: 0-No pain         Complications: No apparent anesthesia complications

## 2015-11-18 NOTE — Op Note (Signed)
DATE OF SURGERY:  11/18/2015  TIME: 4:02 PM  PATIENT NAME:  Kelsey Le  AGE: 82 y.o.  PRE-OPERATIVE DIAGNOSIS:  Displaced right intertrochanteric hip fracture  POST-OPERATIVE DIAGNOSIS:  SAME  PROCEDURE:  INTRAMEDULLARY (IM) NAIL INTERTROCHANTRIC RIGHT HIP FRACTURE  SURGEON:  Juanell FairlyKRASINSKI, Victorian Gunn  OPERATIVE IMPLANTS: Biomet long Affixus nail 11 x 360, 105 mm lag screw with 42 and 44 mm distal interlocking screws  PREOPERATIVE INDICATIONS:  Kelsey Le is a 82 y.o. year old who fell and suffered a hip fracture. She was brought into the ER and then admitted and medically cleared for surgical intervention.    The risks, benefits and alternatives were discussed with the patient and their family.  The risks include but are not limited to infection, bleeding, nerve or blood vessel injury, malunion, nonunion, hardware prominence, hardware failure, change in leg lengths or lower extremity rotation need for further surgery including hardware removal with conversion to a total hip arthroplasty. Medical risks include but are not limited to DVT and pulmonary embolism, myocardial infarction, stroke, pneumonia, respiratory failure and death. The patient and their family understood these risks and wished to proceed with surgery.  OPERATIVE PROCEDURE:  The patient was brought to the operating room and placed in the supine position on the fracture table. General anesthesia was administered.  A closed reduction was performed under C-arm guidance.  The fracture reduction was confirmed on both AP and lateral views. A time out was performed to verify the patient's name, date of birth, medical record number, correct site of surgery correct procedure to be performed. The timeout was also used to verify the patient received antibiotics and all appropriate instruments, implants and radiographic studies were available in the room. Once all in attendance were in agreement, the case began. The patient was prepped and  draped in a sterile fashion. She received preoperative clindamycin given her penicillin allergy.  An incision was made proximal to the greater trochanter in line with the femur. A guidewire was placed over the tip of the greater trochanter and advanced into the proximal femur to the level of the lesser trochanter.  Confirmation of the drill pin position was made on AP and lateral C-arm images.  The threaded guidepin was then overdrilled with the proximal femoral drill.  The nail was then inserted into the proximal femur, across the fracture site and into the femoral shaft. Its position was confirmed on AP and lateral C-arm images.   Once the nail was completely seated, the drill guide for the lag screw was placed through the guide arm for the Affixus nail. A guidepin was then placed through this drill guide and advanced through the lateral cortex of the femur, across the fracture site and into the femoral head achieving a tip apex distance of less than 25 mm. The length of the drill pin was measured to a 105 mm and then the drill for the lag screw was advanced through the lateral cortex, across the fracture site and up into the femoral head to the depth of the lag screw..  The lag screw was then advanced by hand into position across the fracture site into the femoral head. Its final position was confirmed on AP and lateral C-arm images. Compression was applied as traction was carefully released. The set screw in the top of the intramedullary rod was tightened by hand using a screwdriver.  The Affixus guide arm was removed with flexible T handle screwdriver.    Attention was turned to placement  of the distal interlocking screws. A perfect circle technique was utilized. Small stab incisions were made centered over each distal screw hole using C-arm imaging.  A freehand technique was used to drill bicortically. A depth gauge was used to measure screw length. 2 distal interlocking screws were placed, one through  the static screw hole and one through the dynamic screw hole.  These were hand tightened.   Final C-arm images of the entire intramedullary construct proximally and distally were taken in both the AP and lateral planes.   The wounds were irrigated copiously and closed with 0 Vicryl for closure of the deep fascia and 2-0 Vicryl for subcutaneous closure. The skin was approximated with staples. A dry sterile dressing was applied. I was scrubbed and present the entire case and all sharp and instrument counts were correct at the conclusion of the case. Patient was transferred to hospital bed and brought to PACU in stable condition. I spoke with the patient's family in the postop consultation room to let them know the case had gone without complication and the patient was stable in the recovery room.    Kathreen DevoidKevin L Denaly Gatling, MD

## 2015-11-19 LAB — BASIC METABOLIC PANEL
Anion gap: 3 — ABNORMAL LOW (ref 5–15)
BUN: 20 mg/dL (ref 6–20)
CALCIUM: 7.3 mg/dL — AB (ref 8.9–10.3)
CO2: 24 mmol/L (ref 22–32)
CREATININE: 0.8 mg/dL (ref 0.44–1.00)
Chloride: 110 mmol/L (ref 101–111)
GFR calc Af Amer: >60 mL/min (ref >60)
GLUCOSE: 116 mg/dL — AB (ref 65–99)
Potassium: 3.9 mmol/L (ref 3.5–5.1)
Sodium: 137 mmol/L (ref 135–145)

## 2015-11-19 LAB — CBC
HEMATOCRIT: 22.7 % — AB (ref 35.0–47.0)
Hemoglobin: 7.6 g/dL — ABNORMAL LOW (ref 12.0–16.0)
MCH: 30.5 pg (ref 26.0–34.0)
MCHC: 33.5 g/dL (ref 32.0–36.0)
MCV: 91.1 fL (ref 80.0–100.0)
PLATELETS: 121 10*3/uL — AB (ref 150–440)
RBC: 2.49 MIL/uL — ABNORMAL LOW (ref 3.80–5.20)
RDW: 16.3 % — AB (ref 11.5–14.5)
WBC: 8.2 10*3/uL (ref 3.6–11.0)

## 2015-11-19 LAB — URINE CULTURE: Culture: NO GROWTH

## 2015-11-19 LAB — PREPARE RBC (CROSSMATCH)

## 2015-11-19 MED ORDER — FUROSEMIDE 10 MG/ML IJ SOLN
20.0000 mg | Freq: Once | INTRAMUSCULAR | Status: AC
  Administered 2015-11-19: 20 mg via INTRAVENOUS
  Filled 2015-11-19: qty 2

## 2015-11-19 MED ORDER — ACETAMINOPHEN 325 MG PO TABS
650.0000 mg | ORAL_TABLET | Freq: Once | ORAL | Status: DC
  Filled 2015-11-19: qty 2

## 2015-11-19 MED ORDER — SODIUM CHLORIDE 0.9 % IV SOLN
Freq: Once | INTRAVENOUS | Status: AC
  Administered 2015-11-19: 22:00:00 via INTRAVENOUS

## 2015-11-19 MED ORDER — SODIUM CHLORIDE 0.9 % IV SOLN
Freq: Once | INTRAVENOUS | Status: AC
  Administered 2015-11-19: 13:00:00 via INTRAVENOUS

## 2015-11-19 MED ORDER — DIPHENHYDRAMINE HCL 25 MG PO CAPS
25.0000 mg | ORAL_CAPSULE | Freq: Once | ORAL | Status: AC
  Administered 2015-11-19: 25 mg via ORAL
  Filled 2015-11-19: qty 1

## 2015-11-19 NOTE — Progress Notes (Signed)
Physical Therapy Evaluation Patient Details Name: Kelsey Le MRN: [id number] DOB: Jul 12, 1930 Today's Date: 11/19/2015   History of Present Illness  82 year old female who was just discharged yesterday from the hospital after undergoing treatment for a GI bleed. She was walking her dog earlier today and tripped over the curb and fell backwards and has now been diagnosed with a right hip fracture with surgical repair performed yesterday.  Clinical Impression  Patient reports that her R leg is hurting and does not think she should try to move it or get up initially but does agree to try after education about importance of mobility. She needs max assit for all bed mobility including supine to sit and sit to supine. She is not able to push with her arms or follow verbal cuing for use of arms during bed mobility. She continues to use the over bed trapeze instead of using her UE's to push. She is not able to attempt standing at EOB with RW and is not able to tolerate the sitting position. Patient is assisted back to bed with max assist and bed alarm is put back on. AAROM is performed to BLE with patient reporting pain to RLE.   Follow Up Recommendations SNF    Equipment Recommendations  Rolling walker with 5" wheels    Recommendations for Other Services       Precautions / Restrictions Precautions Precautions: Fall Restrictions Weight Bearing Restrictions: Yes (WBAT RLE)      Mobility  Bed Mobility Overal bed mobility: Needs Assistance Bed Mobility: Supine to Sit;Sit to Supine     Supine to sit: Mod assist Sit to supine: Mod assist   General bed mobility comments:  (Mod asssit for trunk support and for LE support)  Transfers Overall transfer level:  (unable to assess due to pain) Equipment used: Rolling walker (2 wheeled)             General transfer comment:  (unable)  Ambulation/Gait Ambulation/Gait assistance:  (unable)              Stairs             Wheelchair Mobility    Modified Rankin (Stroke Patients Only)       Balance Overall balance assessment: Needs assistance Sitting-balance support: Feet supported;Bilateral upper extremity supported Sitting balance-[name] Scale: Poor   Postural control: Posterior lean Standing balance support:  (NT)                                 Pertinent Vitals/Pain Pain Assessment: 0-10 Pain Score: 9  Pain Location: right hip Pain Descriptors / Indicators: Sharp Pain Intervention(s): Repositioned;Limited activity within patient's tolerance    Home Living Family/patient expects to be discharged to:: Private residence Living Arrangements: Alone Available Help at Discharge: Family;Available PRN/intermittently Type of Home: Apartment Home Access: Elevator     Home Layout: One level Home Equipment: Walker - 2 wheels      Prior Function Level of Independence: Independent         Comments: Indep with ADLs, household activities; reports single fall within previous year.  Has small dog that she enjoys taking for a walk approx 3x/day.     Hand Dominance   Dominant Hand: Right    Extremity/Trunk Assessment   Upper Extremity Assessment: Defer to OT evaluation           Lower Extremity Assessment: RLE deficits/detail RLE Deficits / Details: 2/5 hip  strength, 3/5 knee strength    Cervical / Trunk Assessment: Normal  Communication   Communication: HOH  Cognition Arousal/Alertness: Awake/alert Behavior During Therapy: WFL for tasks assessed/performed Overall Cognitive Status: Within Functional Limits for tasks assessed                      General Comments      Exercises General Exercises - Lower Extremity Ankle Circles/Pumps: AAROM Heel Slides: AAROM Hip ABduction/ADduction: AAROM   Assessment/Plan    PT Assessment Patient needs continued PT services  PT Problem List Decreased strength;Decreased range of motion;Decreased activity  tolerance;Decreased balance;Decreased mobility;Decreased knowledge of use of DME;Decreased safety awareness;Pain          PT Treatment Interventions Therapeutic activities;Gait training;DME instruction;Stair training;Functional mobility training;Therapeutic exercise;Balance training;Patient/family education    PT Goals (Current goals can be found in the Care Plan section)  Acute Rehab PT Goals Patient Stated Goal:  (to walk ) PT Goal Formulation: With patient/family Time For Goal Achievement: 12/03/15 Potential to Achieve Goals: Good    Frequency BID   Barriers to discharge Decreased caregiver support      Co-evaluation               End of Session Equipment Utilized During Treatment: Gait belt Activity Tolerance: Patient limited by pain Patient left: in bed;with bed alarm set Nurse Communication: Mobility status         Time: 1050-1130 PT Time Calculation (min) (ACUTE ONLY): 40 min   Charges:   PT Evaluation $PT Eval Moderate Complexity: 1 Procedure PT Treatments $Therapeutic Activity: 8-22 mins   PT G Codes:      Ezekiel InaKristine S Brendi Mccarroll, PT, DPT [place], Barkley BrunsKristine S 11/19/2015, 11:34 AM

## 2015-11-19 NOTE — Care Management Note (Signed)
Case Management Note  Patient Details  Name: Kelsey Le MRN: [id number] Date of Birth: Feb 27, 1930  Subjective/Objective:     82yo Mrs Kelsey Le received a right hip fracture repair by Dr Martha ClanKrasinski on 11/18/15.  Discussed discharge planning with Kelsey Le and her daughter Kelsey Le (cell) [phone]. Current discharge plan is for SNF and family requests [facility]. Weekend CSW was notified. Kelsey Le has a RW and a shower chair at home but no bedside commode. She lives alone in a seniors apartment building and is hard of hearing. PCP=Dr Sparks. Pharmacy=[facility] [address]. No anticoagulant ordered yet per her anemia. CSW is pursuing SNF.  Marland Kitchen.                 Action/Plan:   Expected Discharge Date:                  Expected Discharge Plan:     In-House Referral:     Discharge planning Services     Post Acute Care Choice:    Choice offered to:     DME Arranged:    DME Agency:     HH Arranged:    HH Agency:     Status of Service:     If discussed at [employer] Length of Stay Meetings, dates discussed:    Additional Comments:  Markeya Mincy A, RN 11/19/2015, 3:12 PM

## 2015-11-19 NOTE — Progress Notes (Signed)
PT Cancellation Note  Patient Details Name: Kelsey Le MRN: [id number] DOB: 1930/08/25   Cancelled Treatment:    Reason Eval/Treat Not Completed: Medical issues which prohibited therapy   Attempted session this pm.  Daughter reports she is awaiting a blood transfusion.  Will continue tomorrow as appropriate.   Danielle DessSarah Cariah Salatino 11/19/2015, 12:52 PM

## 2015-11-19 NOTE — NC FL2 (Signed)
[place] MEDICAID FL2 LEVEL OF CARE SCREENING TOOL     IDENTIFICATION  Patient Name: Kelsey Le Birthdate: 1930-05-16 Sex: female Admission Date (Current Location): 11/17/2015  [facility] [place]   Facility and Address:  [facility], [address], [place], [place] [address]      Provider Number: [id number]  Attending Physician Name and Address:  Alford Highlandichard Wieting, MD  Relative Name and Phone Number:       Current Level of Care: Hospital Recommended Level of Care: Skilled Nursing Facility Prior Approval Number:    Date Approved/Denied:   PASRR Number:    Discharge Plan: SNF    Current Diagnoses: Patient Active Problem List   Diagnosis Date Noted  . Hip fracture (HCC) 11/17/2015  . Acute posthemorrhagic anemia 11/16/2015  . H/O transfusion of packed red blood cells 11/16/2015  . Elevated troponin 11/16/2015  . Severe aortic stenosis 11/16/2015  . Thrombocytopenia (HCC) 11/16/2015  . Generalized weakness 11/16/2015  . Melena 11/14/2015  . GI bleed 03/01/2014  . Aortic stenosis 07/27/2013  . Heart murmur 04/26/2013  . SOB (shortness of breath) 04/26/2013  . Elevated blood pressure reading without diagnosis of hypertension 04/26/2013    Orientation RESPIRATION BLADDER Height & Weight     Self, Time, Situation, Place  Normal Continent Weight: 120 lb (54.4 kg) Height:  5\' 5"  (165.1 cm)  BEHAVIORAL SYMPTOMS/MOOD NEUROLOGICAL BOWEL NUTRITION STATUS      Continent    AMBULATORY STATUS COMMUNICATION OF NEEDS Skin   Total Care Verbally Surgical wounds                       Personal Care Assistance Level of Assistance  Bathing, Feeding, Dressing, Total care Bathing Assistance: Maximum assistance Feeding assistance: Independent Dressing Assistance: Maximum assistance Total Care Assistance: Maximum assistance   Functional Limitations Info  Hearing   Hearing Info: Impaired      SPECIAL CARE FACTORS  FREQUENCY  PT (By licensed PT), OT (By licensed OT)     PT Frequency: Up to 5X per day, 5 days per week OT Frequency: Up to 5X per day, 5 days per week            Contractures Contractures Info: Present    Additional Factors Info  Allergies   Allergies Info: Penicillins           Current Medications (11/19/2015):  This is the current hospital active medication list Current Facility-Administered Medications  Medication Dose Route Frequency Provider Last Rate Last Dose  . acetaminophen (TYLENOL) tablet 650 mg  650 mg Oral Q6H PRN Juanell FairlyKevin Krasinski, MD   650 mg at 11/19/15 [id number]   Or  . acetaminophen (TYLENOL) suppository 650 mg  650 mg Rectal Q6H PRN Juanell FairlyKevin Krasinski, MD      . acetaminophen (TYLENOL) tablet 650 mg  650 mg Oral Once Juanell FairlyKevin Krasinski, MD   Stopped at 11/19/15 1318  . alum & mag hydroxide-simeth (MAALOX/MYLANTA) 200-200-20 MG/5ML suspension 30 mL  30 mL Oral Q4H PRN Juanell FairlyKevin Krasinski, MD      . bisacodyl (DULCOLAX) suppository 10 mg  10 mg Rectal Daily PRN Juanell FairlyKevin Krasinski, MD      . docusate sodium (COLACE) capsule 100 mg  100 mg Oral BID Juanell FairlyKevin Krasinski, MD   100 mg at 11/19/15 0913  . ferrous sulfate tablet 325 mg  325 mg Oral TID PC Juanell FairlyKevin Krasinski, MD   325 mg at 11/19/15 1225  . furosemide (LASIX) injection  20 mg  20 mg Intravenous Once Alford Highlandichard Wieting, MD      . HYDROcodone-acetaminophen (NORCO/VICODIN) 5-325 MG per tablet 1-2 tablet  1-2 tablet Oral Q6H PRN Juanell FairlyKevin Krasinski, MD   1 tablet at 11/19/15 1246  . magnesium citrate solution 1 Bottle  1 Bottle Oral Once PRN Juanell FairlyKevin Krasinski, MD      . menthol-cetylpyridinium (CEPACOL) lozenge 3 mg  1 lozenge Oral PRN Juanell FairlyKevin Krasinski, MD       Or  . phenol (CHLORASEPTIC) mouth spray 1 spray  1 spray Mouth/Throat PRN Juanell FairlyKevin Krasinski, MD      . methocarbamol (ROBAXIN) tablet 500 mg  500 mg Oral Q6H PRN Juanell FairlyKevin Krasinski, MD   500 mg at 11/19/15 [id number]   Or  . methocarbamol (ROBAXIN) 500 mg in dextrose 5 % 50 mL IVPB  500 mg  Intravenous Q6H PRN Juanell FairlyKevin Krasinski, MD      . morphine 2 MG/ML injection 2 mg  2 mg Intravenous Q2H PRN Juanell FairlyKevin Krasinski, MD   2 mg at 11/19/15 1304  . ondansetron (ZOFRAN) tablet 4 mg  4 mg Oral Q6H PRN Juanell FairlyKevin Krasinski, MD       Or  . ondansetron [facility](ZOFRAN) injection 4 mg  4 mg Intravenous Q6H PRN Juanell FairlyKevin Krasinski, MD      . pantoprazole (PROTONIX) EC tablet 40 mg  40 mg Oral BID AC Gracelyn NurseJohn D Johnston, MD   40 mg at 11/18/15 0755  . polyethylene glycol (MIRALAX / GLYCOLAX) packet 17 g  17 g Oral Daily PRN Juanell FairlyKevin Krasinski, MD      . Gwyndolyn Kaufmansenna [facility](SENOKOT) tablet 8.6 mg  1 tablet Oral BID Juanell FairlyKevin Krasinski, MD   8.6 mg at 11/19/15 0913  . vitamin B-12 (CYANOCOBALAMIN) tablet 1,000 mcg  1,000 mcg Oral Daily Gracelyn NurseJohn D Johnston, MD   1,000 mcg at 11/19/15 [id number]     Discharge Medications: Please see discharge summary for a list of discharge medications.  Relevant Imaging Results:  Relevant Lab Results:   Additional Information  SS #[id number]  Judi CongKaren M Emmarae Cowdery, LCSW

## 2015-11-19 NOTE — Clinical Social Work Note (Signed)
Clinical Social Work Assessment  Patient Details  Name: Kelsey Le MRN: [id number] Date of Birth: [birth year]  Date of referral:  11/19/15               Reason for consult:  Facility Placement                Permission sought to share information with:  Facility Industrial/product designerContact Representative Permission granted to share information::  Yes, Verbal Permission Granted  Name::        Agency::     Relationship::     Contact Information:     Housing/Transportation Living arrangements for the past 2 months:  Apartment Source of Information:  Adult Children, Medical Team Patient Interpreter Needed:  None Criminal Activity/Legal Involvement Pertinent to Current Situation/Hospitalization:  No - Comment as needed Significant Relationships:  Adult Children Lives with:  Self Do you feel safe going back to the place where you live?  Yes Need for family participation in patient care:  No (Coment)  Care giving concerns:  STR   Social Worker assessment / plan:  CSW consulted with RNCM for assessment. The patient is baseline hearing impaired, and her daughter Kelsey Le is involved with care. Kelsey Le gave verbal permission for STR SNF bed search and indicated that Kelsey Le is the preference. The patient lives alone and has no DME at home.  Employment status:  Retired Health and safety inspectornsurance information:  Medicare PT Recommendations:  Skilled Nursing Facility Information / Referral to community resources:  Skilled Nursing Facility  Patient/Family's Response to care:  Patient's family attentive to needs of patient.  Patient/Family's Understanding of and Emotional Response to Diagnosis, Current Treatment, and Prognosis:  Patient's family in agreement with PT rec for STR.  Emotional Assessment Appearance:  Appears stated age Attitude/Demeanor/Rapport:  Other (Assessment conducted via telephone; [facility]) Affect (typically observed):   (Assessment conducted via telephone; [facility]) Orientation:    (Assessment conducted via telephone; information from [facility] - [facility] DrRNCM) Alcohol / Substance use:  Never Used Psych involvement (Current and /or in the community):  No (Comment)  Discharge Needs  Concerns to be addressed:  Discharge Planning Concerns Readmission within the last 30 days:  No Current discharge risk:  None Barriers to Discharge:  Continued Medical Work up   [employer] CorporationKaren M Franck Vinal, LCSW 11/19/2015, 4:14 PM

## 2015-11-19 NOTE — Evaluation (Signed)
Occupational Therapy Evaluation Patient Details Name: [name] Ridling MRN: [id number] DOB: Mar 24, 1930 Today's Date: 11/19/2015    History of Present Illness 82 year old female who was just discharged yesterday from the hospital after undergoing treatment for a GI bleed. She was walking her dog earlier today and tripped over the curb and fell backwards and has now been diagnosed with a right hip fracture with surgical repair performed yesterday.   Clinical Impression   Pt very limited by pain during evaluation. Pt somewhat confused as to type of residence she lives in. Change in memory suspect due to meds/surgery yesterday. Pt would benefit from continued OT services to address pain, decreased strength, ROM, and activity tolerance as well as energy conservation as they relate to pt's functional independence in ADL/IADL. Recommend SNF for additional rehabilitation prior to return home.     Follow Up Recommendations  SNF    Equipment Recommendations       Recommendations for Other Services       Precautions / Restrictions Precautions Precautions: Fall Restrictions Weight Bearing Restrictions: Yes (WBAT RLE)      Mobility Bed Mobility Overal bed mobility: Needs Assistance Bed Mobility: Supine to Sit;Sit to Supine     Supine to sit: Mod assist Sit to supine: Mod assist   General bed mobility comments: Pt utilized trapeze overhead and bent LLE to help scoot up in bed with supervision and set up, winced due to pain  Transfers Overall transfer level:  (unable to assess due to pain) Equipment used: Rolling walker (2 wheeled)             General transfer comment: Continue to assess transfers and bed mobility as pt's pain decreases     Balance Overall balance assessment: Needs assistance Sitting-balance support: Feet supported;Bilateral upper extremity supported Sitting balance-[name] Scale: Poor   Postural control: Posterior lean Standing balance support:  (NT)                                 ADL Overall ADL's : Needs assistance/impaired Eating/Feeding: Set up;Sitting   Grooming: Set up;Bed level   Upper Body Bathing: Set up;Sitting;Supervision/ safety Upper Body Bathing Details (indicate cue type and reason): Per clinical judgment, pt could perform UB bathing task while seated EOB after set up and with supervision for safety Lower Body Bathing: Minimal assistance;Set up;With adaptive equipment;Bed level Lower Body Bathing Details (indicate cue type and reason): Per clinical judgment, pt could perform LB bathing task bed level after set up using long handled sponge/AE with supervision for safety and min assist  Upper Body Dressing : Set up   Lower Body Dressing: Moderate assistance;With adaptive equipment;Cueing for compensatory techniques Lower Body Dressing Details (indicate cue type and reason): Per clinical judgment, pt could perform LB dressing tasks while at bed level or seated EOB with mod assist using AE/reacher and cuing for compensatory techniques    Toilet Transfer Details (indicate cue type and reason): Continue to assess, pt did not attempt during session due to pain which limited pt's mobility   Toileting - Clothing Manipulation Details (indicate cue type and reason): Continue to assess, pt did not attempt during session due to pain which limited pt's mobility             Vision Vision Assessment?: No apparent visual deficits   Perception     Praxis      Pertinent Vitals/Pain Pain Assessment: Faces Pain Score: 9  Faces Pain Scale: Hurts whole lot Pain Location: right hip, unable to verbalize pain using 10-point pain scale Pain Descriptors / Indicators: Sharp Pain Intervention(s): Limited activity within patient's tolerance;Monitored during session;Repositioned     Hand Dominance Right   Extremity/Trunk Assessment Upper Extremity Assessment Upper Extremity Assessment: Overall WFL for tasks assessed    Lower Extremity Assessment Lower Extremity Assessment: RLE deficits/detail;Defer to PT evaluation RLE Deficits / Details: 2/5 hip strength, 3/5 knee strength RLE: Unable to fully assess due to pain   Cervical / Trunk Assessment Cervical / Trunk Assessment: Normal   Communication Communication Communication: HOH (wears bilateral hearing aids)   Cognition Arousal/Alertness: Awake/alert Behavior During Therapy: WFL for tasks assessed/performed Overall Cognitive Status: Impaired/Different from baseline Area of Impairment: Memory     Memory: Decreased short-term memory         General Comments: Pt reports living in retirement community, but per PT report after speaking with pt's daughter the pt lives in an apartment that happens to house a lot of older adults.   General Comments       Exercises Exercises: General Lower Extremity     Shoulder Instructions      Home Living Family/patient expects to be discharged to::  (Pt reports living in retirement community, unable to recall the name of it) Living Arrangements: Alone Available Help at Discharge: Family;Available PRN/intermittently (Pt reports adult son and daughter live locally, but reports that she doesn't expect to have much help afterwards) Type of Home: Apartment Home Access: Elevator     Home Layout: One level               Home Equipment: Walker - 2 wheels          Prior Functioning/Environment Level of Independence: Independent        Comments: Indep with ADLs, household activities; reports single fall within previous year.  Has small dog that she enjoys taking for a walk approx 3x/day.        OT Problem List: Decreased strength;Pain;Decreased range of motion;Decreased activity tolerance;Decreased safety awareness;Impaired balance (sitting and/or standing);Decreased knowledge of use of DME or AE   OT Treatment/Interventions: Self-care/ADL training;Therapeutic exercise;Therapeutic activities;Energy  conservation;DME and/or AE instruction;Patient/family education    OT Goals(Current goals can be found in the care plan section) Acute Rehab OT Goals Patient Stated Goal: to go home OT Goal Formulation: With patient Time For Goal Achievement: 12/03/15 Potential to Achieve Goals: Fair  OT Frequency: Min 2X/week   Barriers to D/C: Decreased caregiver support          Co-evaluation              End of Session    Activity Tolerance: Patient limited by pain Patient left: in bed;with call bell/phone within reach;with bed alarm set   Time: [id number] OT Time Calculation (min): 31 min Charges:  OT General Charges $OT Visit: 1 Procedure OT Evaluation $OT Eval Moderate Complexity: 1 Procedure G-Codes:    Eliezer BottomJamie L Stiller, OTR/L 11/19/2015, 11:49 AM

## 2015-11-19 NOTE — Progress Notes (Signed)
Subjective:  POD #1 s/p medullary fixation of right intertrochanteric hip fracture. Patient reports pain as marked following recent physical therapy.  Patient's daughter is at the bedside. Patient is anemic postop. She has also been treated recently for anemia earlier this week.  Objective:   VITALS:   Vitals:   11/18/15 2238 11/19/15 0354 11/19/15 0731 11/19/15 1124  BP: 135/67 (!) 110/34 (!) 100/52 (!) 107/40  Pulse: 70 84 69 89  Resp: 19 20 18 18   Temp: 98.5 F (36.9 C) 98.3 F (36.8 C) 98.4 F (36.9 C) 98.4 F (36.9 C)  TempSrc:  Oral Oral Oral  SpO2: 100% 100% 100% 100%  Weight:      Height:        PHYSICAL EXAM:  Right lower extremity: Patient has her dressing reinforced. She had increased drainage and medially postop. There is no evidence of active drainage. Her thigh is soft and compressible. She has no calf tenderness or lower leg edema. She has foot pumps and TED stockings in place. She has palpable pedal pulses, intact sensation light touch and intact motor function distally. Patient flex and extend her toes and dorsiflex and plantarflex her ankle.   LABS  Results for orders placed or performed during the hospital encounter of 11/17/15 (from the past 24 hour(s))  CBC     Status: Abnormal   Collection Time: 11/18/15  5:13 PM  Result Value Ref Range   WBC 10.8 3.6 - 11.0 K/uL   RBC 2.98 (L) 3.80 - 5.20 MIL/uL   Hemoglobin 9.4 (L) 12.0 - 16.0 g/dL   HCT 16.127.5 (L) 09.635.0 - 04.547.0 %   MCV 92.3 80.0 - 100.0 fL   MCH 31.4 26.0 - 34.0 pg   MCHC 34.1 32.0 - 36.0 g/dL   RDW 40.916.4 (H) 81.111.5 - 91.414.5 %   Platelets 142 (L) 150 - 440 K/uL  CBC     Status: Abnormal   Collection Time: 11/19/15  3:36 AM  Result Value Ref Range   WBC 8.2 3.6 - 11.0 K/uL   RBC 2.49 (L) 3.80 - 5.20 MIL/uL   Hemoglobin 7.6 (L) 12.0 - 16.0 g/dL   HCT 78.222.7 (L) 95.635.0 - 21.347.0 %   MCV 91.1 80.0 - 100.0 fL   MCH 30.5 26.0 - 34.0 pg   MCHC 33.5 32.0 - 36.0 g/dL   RDW 08.616.3 (H) 57.811.5 - 46.914.5 %   Platelets 121  (L) 150 - 440 K/uL  Basic metabolic panel     Status: Abnormal   Collection Time: 11/19/15  3:36 AM  Result Value Ref Range   Sodium 137 135 - 145 mmol/L   Potassium 3.9 3.5 - 5.1 mmol/L   Chloride 110 101 - 111 mmol/L   CO2 24 22 - 32 mmol/L   Glucose, Bld 116 (H) 65 - 99 mg/dL   BUN 20 6 - 20 mg/dL   Creatinine, Ser 6.290.80 0.44 - 1.00 mg/dL   Calcium 7.3 (L) 8.9 - 10.3 mg/dL   GFR calc non Af Amer >60 >60 mL/min   GFR calc Af Amer >60 >60 mL/min   Anion gap 3 (L) 5 - 15  Prepare RBC     Status: None   Collection Time: 11/19/15  1:00 PM  Result Value Ref Range   Order Confirmation ORDER PROCESSED BY BLOOD BANK     Dg Chest 1 View  Result Date: 11/17/2015 CLINICAL DATA:  Larey SeatFell today and fractured right hip. EXAM: CHEST 1 VIEW COMPARISON:  Chest x-ray 11/14/2015  FINDINGS: The heart is upper limits of normal in size and stable. Stable tortuosity and calcification of the thoracic aorta. Stable emphysematous changes. The lungs are clear. Scattered apical densities are stable and likely a combination of scar tissue in granulomas. The bony thorax is intact. IMPRESSION: No acute cardiopulmonary findings. No change since prior studies. Stable emphysematous changes. Electronically Signed   By: Rudie MeyerP.  Gallerani M.D.   On: 11/17/2015 18:59   Dg Hip Operative Unilat W Or W/o Pelvis Right  Result Date: 11/18/2015 CLINICAL DATA:  Right intra medullary nail EXAM: OPERATIVE RIGHT HIP (WITH PELVIS IF PERFORMED) 4 VIEWS TECHNIQUE: Fluoroscopic spot image(s) were submitted for interpretation post-operatively. COMPARISON:  11/17/2015 FINDINGS: Multiple intraoperative spot images demonstrate placement of hip screw and intra medullary nail across the right femoral intertrochanteric fracture. Near anatomic alignment. No hardware complicating feature. IMPRESSION: Internal fixation.  No visible complicating feature. Electronically Signed   By: Charlett NoseKevin  Dover M.D.   On: 11/18/2015 15:30   Dg Hip Unilat W Or Wo Pelvis  2-3 Views Right  Result Date: 11/17/2015 CLINICAL DATA:  Larey SeatFell today and injured right hip. EXAM: DG HIP (WITH OR WITHOUT PELVIS) 2-3V RIGHT COMPARISON:  None. FINDINGS: There is a displaced intertrochanteric fracture of the right hip with a varus deformity and mild displacement of the lesser trochanter. The pubic symphysis and SI joints are intact. No pelvic fractures. Remote left hip fractures are noted within intraedullary rod in place. IMPRESSION: Displaced and comminuted intertrochanteric fracture of the right hip. Electronically Signed   By: Rudie MeyerP.  Gallerani M.D.   On: 11/17/2015 18:53   Dg Femur, Min 2 Views Right  Result Date: 11/18/2015 CLINICAL DATA:  Post-op right IM nail. Patient was combative during imaging, best images obtainable. EXAM: RIGHT FEMUR 2 VIEWS COMPARISON:  11/18/2015 FINDINGS: Patient has undergone placement of lag screw and intra medullary rod for fixation of intertrochanteric fracture of the right femur. Alignment is near anatomic. No evidence for dislocation. No interval fractures are identified. Avulsion of the lesser trochanter again noted. IMPRESSION: Status post ORIF of intertrochanteric fracture of the right femur. Electronically Signed   By: Norva PavlovElizabeth  Brown M.D.   On: 11/18/2015 16:48    Assessment/Plan: 1 Day Post-Op   Active Problems:   Hip fracture [facility](HCC)  Patient is doing well postop. I'm going to order 2 units of PRBCs to be transfused today given her anemia. Labs will be redrawn in the morning. Patient will continue physical and occupational therapy. We will continue current pain management. We are holding on anticoagulation given her recent admission for anemia earlier this week. I discussed this with Dr. Letitia LibraJohnston and Dr. wall from GI who have recommended with no anticoagulation.    Juanell FairlyKRASINSKI, Katerine Morua , MD 11/19/2015, 12:56 PM

## 2015-11-19 NOTE — Progress Notes (Signed)
Patient ID: Kelsey Le, female   DOB: 02/25/1930, 82 y.o.   MRN: [id number]   [employer] PROGRESS NOTE  Kelsey Le WUJ:[id number] DOB: 01-12-1930 DOA: 11/17/2015 PCP: Marguarite Arbour, MD  HPI/Subjective: Patient seen earlier this afternoon and patient was crying with severe pain 15 out of 10 intensity. The patient was given 2 oral pain medications and I asked the nurse to give an IV pain medications at this time. Hard to get much more from her at this point with the severe pain.   Objective: Vitals:   11/19/15 1309 11/19/15 1342  BP: (!) 122/44 (!) 118/41  Pulse: 97 95  Resp: 17 17  Temp: 98.6 F (37 C) 98.9 F (37.2 C)    Filed Weights   11/17/15 1747 11/17/15 2300 11/18/15 1200  Weight: 54.4 kg (120 lb) 54.7 kg (120 lb 8 oz) 54.4 kg (120 lb)    ROS: Review of Systems  Unable to perform ROS: Severity of pain  Musculoskeletal: Positive for joint pain.   Exam: Physical Exam  Constitutional: She is oriented to person, place, and time.  HENT:  Nose: No mucosal edema.  Mouth/Throat: No oropharyngeal exudate or posterior oropharyngeal edema.  Eyes: Conjunctivae, EOM and lids are normal. Pupils are equal, round, and reactive to light.  Neck: No JVD present. Carotid bruit is not present. No edema present. No thyroid mass and no thyromegaly present.  Cardiovascular: S1 normal and S2 normal.  Exam reveals no gallop.   No murmur heard. Pulses:      Dorsalis pedis pulses are 2+ on the right side, and 2+ on the left side.  Respiratory: No respiratory distress. She has no wheezes. She has no rhonchi. She has no rales.  GI: Soft. Bowel sounds are normal. There is no tenderness.  Musculoskeletal:       Right ankle: She exhibits no swelling.       Left ankle: She exhibits no swelling.  Lymphadenopathy:    She has no cervical adenopathy.  Neurological: She is alert and oriented to person, place, and time. No cranial nerve deficit.  Skin: Skin is warm. No rash noted.  Nails show no clubbing.  Psychiatric: She has a normal mood and affect.      Data Reviewed: Basic Metabolic Panel:  Recent Labs Lab 11/14/15 1550 11/17/15 1811 11/18/15 0421 11/19/15 0336  NA 140 141 140 137  K 3.6 3.8 3.9 3.9  CL 112* 108 110 110  CO2 23 23 24 24   GLUCOSE 87 111* 106* 116*  BUN 39* 22* 19 20  CREATININE 0.88 0.87 0.73 0.80  CALCIUM 8.6* 8.8* 8.1* 7.3*   Liver Function Tests:  Recent Labs Lab 11/14/15 1550 11/17/15 1811  AST 28 34  ALT 13* 16  ALKPHOS 48 60  BILITOT 0.6 0.8  PROT 5.7* 6.1*  ALBUMIN 3.4* 3.8  CBC:  Recent Labs Lab 11/15/15 0516  11/16/15 1600 11/17/15 1811 11/18/15 0421 11/18/15 1713 11/19/15 0336  WBC 5.3  --   --  6.4 7.8 10.8 8.2  NEUTROABS  --   --   --  3.3  --   --   --   HGB 8.6*  < > 10.6* 10.1* 8.4* 9.4* 7.6*  HCT 24.9*  --   --  30.3* 24.9* 27.5* 22.7*  MCV 89.6  --   --  92.1 91.5 92.3 91.1  PLT 128*  --  173 181 156 142* 121*  < > = values in this interval not displayed. Cardiac  Enzymes:  Recent Labs Lab 11/14/15 1550 11/15/15 0516  TROPONINI 0.03* 0.07*     Recent Results (from the past 240 hour(s))  Urine culture     Status: None   Collection Time: 11/17/15  8:09 PM  Result Value Ref Range Status   Specimen Description URINE, RANDOM  Final   Special Requests NONE  Final   Culture [facility] GROWTH [facility]   Final   Report Status 11/19/2015 FINAL  Final  MRSA PCR Screening     Status: None   Collection Time: 11/17/15 10:59 PM  Result Value Ref Range Status   MRSA by PCR NEGATIVE NEGATIVE Final    Comment:        The GeneXpert MRSA Assay (FDA approved for NASAL specimens only), is one component of a comprehensive MRSA colonization surveillance program. It is not intended to diagnose MRSA infection nor to guide or monitor treatment for MRSA infections.      Studies: Dg Chest 1 View  Result Date: 11/17/2015 CLINICAL DATA:  Larey SeatFell today and fractured right hip. EXAM:  CHEST 1 VIEW COMPARISON:  Chest x-ray 11/14/2015 FINDINGS: The heart is upper limits of normal in size and stable. Stable tortuosity and calcification of the thoracic aorta. Stable emphysematous changes. The lungs are clear. Scattered apical densities are stable and likely a combination of scar tissue in granulomas. The bony thorax is intact. IMPRESSION: No acute cardiopulmonary findings. No change since prior studies. Stable emphysematous changes. Electronically Signed   By: Rudie MeyerP.  Gallerani M.D.   On: 11/17/2015 18:59   Dg Hip Operative Unilat W Or W/o Pelvis Right  Result Date: 11/18/2015 CLINICAL DATA:  Right intra medullary nail EXAM: OPERATIVE RIGHT HIP (WITH PELVIS IF PERFORMED) 4 VIEWS TECHNIQUE: Fluoroscopic spot image(s) were submitted for interpretation post-operatively. COMPARISON:  11/17/2015 FINDINGS: Multiple intraoperative spot images demonstrate placement of hip screw and intra medullary nail across the right femoral intertrochanteric fracture. Near anatomic alignment. No hardware complicating feature. IMPRESSION: Internal fixation.  No visible complicating feature. Electronically Signed   By: Charlett NoseKevin  Dover M.D.   On: 11/18/2015 15:30   Dg Hip Unilat W Or Wo Pelvis 2-3 Views Right  Result Date: 11/17/2015 CLINICAL DATA:  Larey SeatFell today and injured right hip. EXAM: DG HIP (WITH OR WITHOUT PELVIS) 2-3V RIGHT COMPARISON:  None. FINDINGS: There is a displaced intertrochanteric fracture of the right hip with a varus deformity and mild displacement of the lesser trochanter. The pubic symphysis and SI joints are intact. No pelvic fractures. Remote left hip fractures are noted within intraedullary rod in place. IMPRESSION: Displaced and comminuted intertrochanteric fracture of the right hip. Electronically Signed   By: Rudie MeyerP.  Gallerani M.D.   On: 11/17/2015 18:53   Dg Femur, Min 2 Views Right  Result Date: 11/18/2015 CLINICAL DATA:  Post-op right IM nail. Patient was combative during imaging, best  images obtainable. EXAM: RIGHT FEMUR 2 VIEWS COMPARISON:  11/18/2015 FINDINGS: Patient has undergone placement of lag screw and intra medullary rod for fixation of intertrochanteric fracture of the right femur. Alignment is near anatomic. No evidence for dislocation. No interval fractures are identified. Avulsion of the lesser trochanter again noted. IMPRESSION: Status post ORIF of intertrochanteric fracture of the right femur. Electronically Signed   By: Norva PavlovElizabeth  Brown M.D.   On: 11/18/2015 16:48    Scheduled Meds: . acetaminophen  650 mg Oral Once  . docusate sodium  100 mg Oral BID  . ferrous sulfate  325 mg Oral TID PC  .  pantoprazole  40 mg Oral BID AC  . senna  1 tablet Oral BID  . vitamin B-12  1,000 mcg Oral Daily    Assessment/Plan:  1. Acute blood loss anemia, postoperative anemia. Orthopedic surgery ordered 2 units of blood. I will give Lasix in between units of blood. Hemoglobin tomorrow morning. I will likely give IV iron tomorrow. 2. 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 Avoid anticoagulation. Continue Protonix 3. Iron deficiency anemia. 2 units of blood ordered for today for hemoglobin of 7.6 4. Right hip fracture requiring operative repair initial evaluation of closed fracture. likely to rehabilitation on Monday. Patient in severe pain today requiring IV morphine.  Code Status:     Code Status Orders        Start     Ordered   11/17/15 2252  Full code  Continuous     11/17/15 2251    Code Status History    Date Active Date Inactive Code Status Order ID Comments User Context   11/17/2015 10:51 PM 11/17/2015 10:51 PM Full Code [id number]  Gracelyn NurseJohn D Johnston, MD Inpatient   11/14/2015  7:19 PM 11/16/2015  8:25 PM Full Code [id number]  Enedina FinnerSona Patel, MD Inpatient   08/19/2014  2:52 PM 08/19/2014  6:42 PM Full Code [id number]  Kennedy BuckerMichael Menz, MD Inpatient     Family Communication: spoke with family At bedside Disposition Plan: will need rehabilitation on  Monday  Consultants:  Orthopedic surgery  Time spent: 25 minutes  Alford HighlandWIETING, Newton Frutiger  [employer]

## 2015-11-20 LAB — BASIC METABOLIC PANEL
Anion gap: 4 — ABNORMAL LOW (ref 5–15)
BUN: 18 mg/dL (ref 6–20)
CALCIUM: 7.5 mg/dL — AB (ref 8.9–10.3)
CO2: 25 mmol/L (ref 22–32)
CREATININE: 0.83 mg/dL (ref 0.44–1.00)
Chloride: 108 mmol/L (ref 101–111)
GFR calc Af Amer: >60 mL/min (ref >60)
GFR calc non Af Amer: >60 mL/min (ref >60)
GLUCOSE: 101 mg/dL — AB (ref 65–99)
Potassium: 3.4 mmol/L — ABNORMAL LOW (ref 3.5–5.1)
Sodium: 137 mmol/L (ref 135–145)

## 2015-11-20 LAB — CBC
HEMATOCRIT: 31.1 % — AB (ref 35.0–47.0)
Hemoglobin: 10.7 g/dL — ABNORMAL LOW (ref 12.0–16.0)
MCH: 29.7 pg (ref 26.0–34.0)
MCHC: 34.3 g/dL (ref 32.0–36.0)
MCV: 86.6 fL (ref 80.0–100.0)
Platelets: 120 10*3/uL — ABNORMAL LOW (ref 150–440)
RBC: 3.59 MIL/uL — ABNORMAL LOW (ref 3.80–5.20)
RDW: 18.4 % — AB (ref 11.5–14.5)
WBC: 8.6 10*3/uL (ref 3.6–11.0)

## 2015-11-20 MED ORDER — IRON SUCROSE 20 MG/ML IV SOLN
400.0000 mg | Freq: Once | INTRAVENOUS | Status: AC
  Administered 2015-11-20: 400 mg via INTRAVENOUS
  Filled 2015-11-20: qty 20

## 2015-11-20 MED ORDER — POTASSIUM CHLORIDE CRYS ER 20 MEQ PO TBCR
40.0000 meq | EXTENDED_RELEASE_TABLET | Freq: Once | ORAL | Status: AC
  Administered 2015-11-20: 40 meq via ORAL
  Filled 2015-11-20: qty 2

## 2015-11-20 NOTE — Progress Notes (Signed)
Subjective:  POD #2 s/p IM fixation for right hip fracture.   Patient reports pain as mild to moderate.  Pain improved today.  Patient says she feels tired after she had a lot of guests this AM.  Objective:   VITALS:   Vitals:   11/19/15 2219 11/20/15 0047 11/20/15 0409 11/20/15 0729  BP: (!) 110/39 (!) 115/44 116/63 (!) 119/54  Pulse: (!) 109 (!) 113 (!) 118 (!) 105  Resp:  16 18 18   Temp:  98.9 F (37.2 C) 97.8 F (36.6 C) 98.8 F (37.1 C)  TempSrc:  Oral  Oral  SpO2: 94% 94% 94% 93%  Weight:      Height:        PHYSICAL EXAM:  Right lower extremity:  I personally changed the bandage this AM. Neurovascular intact Sensation intact distally Intact pulses distally Dorsiflexion/Plantar flexion intact Incision: dressing C/D/I and no drainage No cellulitis present Compartment soft  LABS  Results for orders placed or performed during the hospital encounter of 11/17/15 (from the past 24 hour(s))  Prepare RBC     Status: None   Collection Time: 11/19/15  9:30 PM  Result Value Ref Range   Order Confirmation ORDER PROCESSED BY BLOOD BANK   CBC     Status: Abnormal   Collection Time: 11/20/15  3:55 AM  Result Value Ref Range   WBC 8.6 3.6 - 11.0 K/uL   RBC 3.59 (L) 3.80 - 5.20 MIL/uL   Hemoglobin 10.7 (L) 12.0 - 16.0 g/dL   HCT 16.131.1 (L) 09.635.0 - 04.547.0 %   MCV 86.6 80.0 - 100.0 fL   MCH 29.7 26.0 - 34.0 pg   MCHC 34.3 32.0 - 36.0 g/dL   RDW 40.918.4 (H) 81.111.5 - 91.414.5 %   Platelets 120 (L) 150 - 440 K/uL  Basic metabolic panel     Status: Abnormal   Collection Time: 11/20/15  3:55 AM  Result Value Ref Range   Sodium 137 135 - 145 mmol/L   Potassium 3.4 (L) 3.5 - 5.1 mmol/L   Chloride 108 101 - 111 mmol/L   CO2 25 22 - 32 mmol/L   Glucose, Bld 101 (H) 65 - 99 mg/dL   BUN 18 6 - 20 mg/dL   Creatinine, Ser 7.820.83 0.44 - 1.00 mg/dL   Calcium 7.5 (L) 8.9 - 10.3 mg/dL   GFR calc non Af Amer >60 >60 mL/min   GFR calc Af Amer >60 >60 mL/min   Anion gap 4 (L) 5 - 15    Dg Hip  Operative Unilat W Or W/o Pelvis Right  Result Date: 11/18/2015 CLINICAL DATA:  Right intra medullary nail EXAM: OPERATIVE RIGHT HIP (WITH PELVIS IF PERFORMED) 4 VIEWS TECHNIQUE: Fluoroscopic spot image(s) were submitted for interpretation post-operatively. COMPARISON:  11/17/2015 FINDINGS: Multiple intraoperative spot images demonstrate placement of hip screw and intra medullary nail across the right femoral intertrochanteric fracture. Near anatomic alignment. No hardware complicating feature. IMPRESSION: Internal fixation.  No visible complicating feature. Electronically Signed   By: Charlett NoseKevin  Dover M.D.   On: 11/18/2015 15:30   Dg Femur, Min 2 Views Right  Result Date: 11/18/2015 CLINICAL DATA:  Post-op right IM nail. Patient was combative during imaging, best images obtainable. EXAM: RIGHT FEMUR 2 VIEWS COMPARISON:  11/18/2015 FINDINGS: Patient has undergone placement of lag screw and intra medullary rod for fixation of intertrochanteric fracture of the right femur. Alignment is near anatomic. No evidence for dislocation. No interval fractures are identified. Avulsion of the lesser trochanter again  noted. IMPRESSION: Status post ORIF of intertrochanteric fracture of the right femur. Electronically Signed   By: Norva PavlovElizabeth  Brown M.D.   On: 11/18/2015 16:48    Assessment/Plan: 2 Days Post-Op   Active Problems:   Hip fracture [facility](HCC)  Patient making progress post-op.  Pain better controlled today.  Hemoglobin and hematocrit improved after 2 units of PRBC.  Continue PT.  Encourage IS while awake.  No anticoagulation due to history of AVM.  SNF upon discharge.    Juanell FairlyKRASINSKI, Atisha Hamidi , MD 11/20/2015, 1:37 PM

## 2015-11-20 NOTE — Progress Notes (Signed)
PT Cancellation Note  Patient Details Name: Kelsey Le MRN: [id number] DOB: [birth year]   Cancelled Treatment:    Reason Eval/Treat Not Completed: Other (comment). Chart reviewed, RN/MD consulted, with team discussion regarding pt having an undesirable experience with PT 1DA, and remaining feelings of resentment and anger due to pain associated with PT session. Treatment team agreeable to assure patient receive adequate pain meds prior to PT session today, which was achieved and that PT take extra steps to attend to the patients anxieties and concerns during treatment session. Pt was immediately conversational and pleasant, but then later expresses gradually progressive reluctance to participate in PT session, citing excessive fatigue from lots of visitors to her room today, as well as some anxiety about any potential pain that may be associated with therapy.  Pt is reminded of the importance of taking part in therapy for the optimal functional outcome, however she says as soon as PT causes any pain, she will ask PT to leave the room. PT explains that today's session will focus more heavily on being gentle, but that rehab for this diagnosis/surgery is almost always inevitably associated with some level of discomfort and/or pain, to which the patient reports she is aware of this from her last hip fracture and is still not interested in increasing her pain at this time. The patient reports she will just do rehab when she leaves the hospital, and PT reminds her that even then, PT will likely include some discomfort. PT explained importance of early rehab for best chance at return to ambulatory function and optimal healing of area around the prosthesis, and that walking may be something that is unable to be performed easily in the future if rehab is not completed, after which she says "stop telling me all these sad stories." PT explained to the patient that I was there solely to help her, not cause harm, but  to help her return to her prior level of function, to participate in activities that she enjoys doing. Pt then asked PT to "stop arguing" with her and to "leave it alone", that she would not participate in any PT today. PT was agreeable at this point to exit, returning pt, bedding, and furniture as they were found upon entry. The above is abbreviated, however PT spent ~10 minutes in room speaking with pt.    3:03 PM, 11/20/15 Rosamaria LintsAllan C Lamerle Jabs, PT, DPT Physical Therapist - [place] [phone](ASCOM)  [phone] (mobile)

## 2015-11-20 NOTE — Progress Notes (Signed)
Patient ID: Kelsey SextonLouise C Roundtree, female   DOB: 1930/07/20, 82 y.o.   MRN: [id number]   [facility] PROGRESS NOTE  Kelsey Le:[id number]:[id number] DOB: 1930/07/20 DOA: 11/17/2015 PCP: Marguarite ArbourSPARKS,JEFFREY D, MD  HPI/Subjective: Patient doing better today then yesterday. Less pain. Has not had a bowel movement yet.   Objective: Vitals:   11/20/15 0729 11/20/15 1355  BP: (!) 119/54 (!) 138/52  Pulse: (!) 105 91  Resp: 18 18  Temp: 98.8 F (37.1 C) 98.9 F (37.2 C)    Filed Weights   11/17/15 1747 11/17/15 2300 11/18/15 1200  Weight: 54.4 kg (120 lb) 54.7 kg (120 lb 8 oz) 54.4 kg (120 lb)    ROS: Review of Systems  Constitutional: Negative for chills and fever.  Eyes: Negative for blurred vision.  Respiratory: Negative for cough and shortness of breath.   Cardiovascular: Negative for chest pain.  Gastrointestinal: Positive for constipation. Negative for abdominal pain, diarrhea, nausea and vomiting.  Genitourinary: Negative for dysuria.  Musculoskeletal: Positive for joint pain.  Neurological: Negative for dizziness and headaches.   Exam: Physical Exam  Constitutional: She is oriented to person, place, and time.  HENT:  Nose: No mucosal edema.  Mouth/Throat: No oropharyngeal exudate or posterior oropharyngeal edema.  Eyes: Conjunctivae, EOM and lids are normal. Pupils are equal, round, and reactive to light.  Neck: No JVD present. Carotid bruit is not present. No edema present. No thyroid mass and no thyromegaly present.  Cardiovascular: S1 normal and S2 normal.  Exam reveals no gallop.   No murmur heard. Pulses:      Dorsalis pedis pulses are 2+ on the right side, and 2+ on the left side.  Respiratory: No respiratory distress. She has no wheezes. She has no rhonchi. She has no rales.  GI: Soft. Bowel sounds are normal. There is no tenderness.  Musculoskeletal:       Right ankle: She exhibits no swelling.       Left ankle: She exhibits no swelling.  Lymphadenopathy:    She  has no cervical adenopathy.  Neurological: She is alert and oriented to person, place, and time. No cranial nerve deficit.  Skin: Skin is warm. No rash noted. Nails show no clubbing.  Psychiatric: She has a normal mood and affect.      Data Reviewed: Basic Metabolic Panel:  Recent Labs Lab 11/14/15 1550 11/17/15 1811 11/18/15 0421 11/19/15 0336 11/20/15 0355  NA 140 141 140 137 137  K 3.6 3.8 3.9 3.9 3.4*  CL 112* 108 110 110 108  CO2 23 23 24 24 25   GLUCOSE 87 111* 106* 116* 101*  BUN 39* 22* 19 20 18   CREATININE 0.88 0.87 0.73 0.80 0.83  CALCIUM 8.6* 8.8* 8.1* 7.3* 7.5*   Liver Function Tests:  Recent Labs Lab 11/14/15 1550 11/17/15 1811  AST 28 34  ALT 13* 16  ALKPHOS 48 60  BILITOT 0.6 0.8  PROT 5.7* 6.1*  ALBUMIN 3.4* 3.8  CBC:  Recent Labs Lab 11/17/15 1811 11/18/15 0421 11/18/15 1713 11/19/15 0336 11/20/15 0355  WBC 6.4 7.8 10.8 8.2 8.6  NEUTROABS 3.3  --   --   --   --   HGB 10.1* 8.4* 9.4* 7.6* 10.7*  HCT 30.3* 24.9* 27.5* 22.7* 31.1*  MCV 92.1 91.5 92.3 91.1 86.6  PLT 181 156 142* 121* 120*   Cardiac Enzymes:  Recent Labs Lab 11/14/15 1550 11/15/15 0516  TROPONINI 0.03* 0.07*     Recent Results (from the past 240 hour(s))  Urine culture     Status: None   Collection Time: 11/17/15  8:09 PM  Result Value Ref Range Status   Specimen Description URINE, RANDOM  Final   Special Requests NONE  Final   Culture NO GROWTH [facility]   Final   Report Status 11/19/2015 FINAL  Final  MRSA PCR Screening     Status: None   Collection Time: 11/17/15 10:59 PM  Result Value Ref Range Status   MRSA by PCR NEGATIVE NEGATIVE Final    Comment:        The GeneXpert MRSA Assay (FDA approved for NASAL specimens only), is one component of a comprehensive MRSA colonization surveillance program. It is not intended to diagnose MRSA infection nor to guide or monitor treatment for MRSA infections.      Studies: Dg Hip  Operative Unilat W Or W/o Pelvis Right  Result Date: 11/18/2015 CLINICAL DATA:  Right intra medullary nail EXAM: OPERATIVE RIGHT HIP (WITH PELVIS IF PERFORMED) 4 VIEWS TECHNIQUE: Fluoroscopic spot image(s) were submitted for interpretation post-operatively. COMPARISON:  11/17/2015 FINDINGS: Multiple intraoperative spot images demonstrate placement of hip screw and intra medullary nail across the right femoral intertrochanteric fracture. Near anatomic alignment. No hardware complicating feature. IMPRESSION: Internal fixation.  No visible complicating feature. Electronically Signed   By: Charlett NoseKevin  Dover M.D.   On: 11/18/2015 15:30   Dg Femur, Min 2 Views Right  Result Date: 11/18/2015 CLINICAL DATA:  Post-op right IM nail. Patient was combative during imaging, best images obtainable. EXAM: RIGHT FEMUR 2 VIEWS COMPARISON:  11/18/2015 FINDINGS: Patient has undergone placement of lag screw and intra medullary rod for fixation of intertrochanteric fracture of the right femur. Alignment is near anatomic. No evidence for dislocation. No interval fractures are identified. Avulsion of the lesser trochanter again noted. IMPRESSION: Status post ORIF of intertrochanteric fracture of the right femur. Electronically Signed   By: Norva PavlovElizabeth  Brown M.D.   On: 11/18/2015 16:48    Scheduled Meds: . acetaminophen  650 mg Oral Once  . docusate sodium  100 mg Oral BID  . ferrous sulfate  325 mg Oral TID PC  . iron sucrose  400 mg Intravenous Once  . pantoprazole  40 mg Oral BID AC  . senna  1 tablet Oral BID  . vitamin B-12  1,000 mcg Oral Daily    Assessment/Plan:  1. Acute blood loss anemia, postoperative anemia. Status post 3 units of blood during the hospital course. 2. 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 Avoid anticoagulation. Continue Protonix 3. Iron deficiency anemia. Status post 3 units of blood during the hospital course. Hemoglobin today 10.7. Give IV iron today. 4. Right hip fracture requiring  operative repair initial evaluation of closed fracture. Hopefully rehabilitation tomorrow. 5. Constipation. Try to get bowel movement today.  Code Status:     Code Status Orders        Start     Ordered   11/17/15 2252  Full code  Continuous     11/17/15 2251    Code Status History    Date Active Date Inactive Code Status Order ID Comments User Context   11/17/2015 10:51 PM 11/17/2015 10:51 PM Full Code [id number]  Gracelyn NurseJohn D Johnston, MD Inpatient   11/14/2015  7:19 PM 11/16/2015  8:25 PM Full Code [id number]  Enedina FinnerSona Patel, MD Inpatient   08/19/2014  2:52 PM 08/19/2014  6:42 PM Full Code [id number]  Kennedy BuckerMichael Menz, MD Inpatient     Family Communication: spoke with  family At bedside Disposition Plan: will need rehabilitation on Monday  Consultants:  Orthopedic surgery  Time spent: 25 minutes  Alford Highland  [facility]

## 2015-11-21 ENCOUNTER — Inpatient Hospital Stay: Payer: Medicare Other | Admitting: Internal Medicine

## 2015-11-21 ENCOUNTER — Encounter: Payer: Self-pay | Admitting: Orthopedic Surgery

## 2015-11-21 ENCOUNTER — Encounter
Admission: RE | Admit: 2015-11-21 | Discharge: 2015-11-21 | Disposition: A | Discharge status: Home / Self Care | Payer: Medicare Other | Source: Ambulatory Visit | Attending: Internal Medicine | Admitting: Internal Medicine

## 2015-11-21 LAB — CBC
HCT: 30.1 % — ABNORMAL LOW (ref 35.0–47.0)
Hemoglobin: 10.2 g/dL — ABNORMAL LOW (ref 12.0–16.0)
MCH: 29.5 pg (ref 26.0–34.0)
MCHC: 33.7 g/dL (ref 32.0–36.0)
MCV: 87.3 fL (ref 80.0–100.0)
Platelets: 128 10*3/uL — ABNORMAL LOW (ref 150–440)
RBC: 3.45 MIL/uL — ABNORMAL LOW (ref 3.80–5.20)
RDW: 18.5 % — ABNORMAL HIGH (ref 11.5–14.5)
WBC: 7.2 10*3/uL (ref 3.6–11.0)

## 2015-11-21 LAB — TYPE AND SCREEN
ABO/RH(D): A POS
Antibody Screen: NEGATIVE
UNIT DIVISION: 0
UNIT DIVISION: 0
UNIT DIVISION: 0
Unit division: 0

## 2015-11-21 MED ORDER — PANTOPRAZOLE SODIUM 40 MG PO TBEC: 40.0000 mg | DELAYED_RELEASE_TABLET | Freq: Two times a day (BID) | ORAL | Status: DC

## 2015-11-21 MED ORDER — SENNA 8.6 MG PO TABS: 1.0000 | ORAL_TABLET | Freq: Two times a day (BID) | ORAL | 0 refills | Status: DC

## 2015-11-21 MED ORDER — DOCUSATE SODIUM 100 MG PO CAPS: 100.0000 mg | ORAL_CAPSULE | Freq: Two times a day (BID) | ORAL | 0 refills | Status: DC

## 2015-11-21 MED ORDER — FERROUS SULFATE 325 (65 FE) MG PO TABS: 325.0000 mg | ORAL_TABLET | Freq: Three times a day (TID) | ORAL | 3 refills | Status: DC

## 2015-11-21 MED ORDER — HYDROCODONE-ACETAMINOPHEN 5-325 MG PO TABS: 1.0000 | ORAL_TABLET | Freq: Four times a day (QID) | ORAL | 0 refills | Status: DC | PRN

## 2015-11-21 NOTE — Discharge Summary (Signed)
Kelsey Le, 82 y.o., DOB March 31, 1930, MRN [id number]. Admission date: 11/17/2015 Discharge Date 11/21/2015 Primary MD Kelsey Arbour, MD Admitting Physician Kelsey Nurse, MD  Admission Diagnosis  Closed displaced intertrochanteric fracture of right femur, initial encounter [facility]) [S72.141A] Fall from standing, initial encounter [W19.XXXA] Hip fx, right, closed, initial encounter (HCC) [S72.001A]  Discharge Diagnosis   Active Problems:   Hip fracture (HCC) right sided closed  Acute blood loss anemia postoperative in nature status post transfusion Recent history of GI bleeding secondary to gastric AVM no evidence of bleeding from the GI tract noted during this hospitalization Iron deficiency anemia Asthma without evidence of exasperation          Hospital Course This is an 82 year old female who was just discharged yesterday from the hospital after undergoing treatment for a GI bleed. She was walking her dog earlier today and tripped over the curb and fell and was diagnosed with a right hip fracture.  Patient was seen by orthopedics and was taken to the OR. Patient was recently hospitalized for GI bleed related to AVM. Postoperatively her hemoglobin did drop and felt to be due to surgical loss and underlying anemia. She was transfused with 2 units of packed RBCs. Hemoglobin is stable. At this point she is in need of rehabilitation. In light of acute GI bleed recently she is not being treated with any anticoagulation for DVT prophylaxis.           Consults  orthopedic surgery  Significant Tests:  See full reports for all details     Dg Chest 1 View  Result Date: 11/17/2015 CLINICAL DATA:  Kelsey Le today and fractured right hip. EXAM: CHEST 1 VIEW COMPARISON:  Chest x-ray 11/14/2015 FINDINGS: The heart is upper limits of normal in size and stable. Stable tortuosity and calcification of the thoracic aorta. Stable emphysematous changes. The lungs are clear. Scattered apical  densities are stable and likely a combination of scar tissue in granulomas. The bony thorax is intact. IMPRESSION: No acute cardiopulmonary findings. No change since prior studies. Stable emphysematous changes. Electronically Signed   By: Rudie Meyer M.D.   On: 11/17/2015 18:59   Dg Chest 2 View  Result Date: 11/14/2015 CLINICAL DATA:  Black stools EXAM: CHEST  2 VIEW COMPARISON:  02/19/2014 FINDINGS: Lungs are hyperaerated and clear. Apical pleural thickening is stable. The heart is moderately enlarged. Normal vascularity. No pneumothorax or pleural effusion. Upper lumbar kyphoplasty has been performed. Other compression fractures are stable. Osteopenia. IMPRESSION: No active cardiopulmonary disease. Electronically Signed   By: Jolaine Click M.D.   On: 11/14/2015 16:25   Dg Hip Operative Unilat W Or W/o Pelvis Right  Result Date: 11/18/2015 CLINICAL DATA:  Right intra medullary nail EXAM: OPERATIVE RIGHT HIP (WITH PELVIS IF PERFORMED) 4 VIEWS TECHNIQUE: Fluoroscopic spot image(s) were submitted for interpretation post-operatively. COMPARISON:  11/17/2015 FINDINGS: Multiple intraoperative spot images demonstrate placement of hip screw and intra medullary nail across the right femoral intertrochanteric fracture. Near anatomic alignment. No hardware complicating feature. IMPRESSION: Internal fixation.  No visible complicating feature. Electronically Signed   By: Charlett Nose M.D.   On: 11/18/2015 15:30   Dg Hip Unilat W Or Wo Pelvis 2-3 Views Right  Result Date: 11/17/2015 CLINICAL DATA:  Kelsey Le today and injured right hip. EXAM: DG HIP (WITH OR WITHOUT PELVIS) 2-3V RIGHT COMPARISON:  None. FINDINGS: There is a displaced intertrochanteric fracture of the right hip with a varus deformity and mild displacement of the lesser trochanter. The pubic symphysis  and SI joints are intact. No pelvic fractures. Remote left hip fractures are noted within intraedullary rod in place. IMPRESSION: Displaced and  comminuted intertrochanteric fracture of the right hip. Electronically Signed   By: Rudie MeyerP.  Gallerani M.D.   On: 11/17/2015 18:53   Dg Femur, Min 2 Views Right  Result Date: 11/18/2015 CLINICAL DATA:  Post-op right IM nail. Patient was combative during imaging, best images obtainable. EXAM: RIGHT FEMUR 2 VIEWS COMPARISON:  11/18/2015 FINDINGS: Patient has undergone placement of lag screw and intra medullary rod for fixation of intertrochanteric fracture of the right femur. Alignment is near anatomic. No evidence for dislocation. No interval fractures are identified. Avulsion of the lesser trochanter again noted. IMPRESSION: Status post ORIF of intertrochanteric fracture of the right femur. Electronically Signed   By: Norva PavlovElizabeth  Brown M.D.   On: 11/18/2015 16:48       Today   Subjective:   Kelsey Le patient feels well denies any significant complaints  Objective:   Blood pressure (!) 147/88, pulse 93, temperature 98.8 F (37.1 C), temperature source Oral, resp. rate 16, height 5\' 5"  (1.651 m), weight 120 lb (54.4 kg), SpO2 94 %.  .  Intake/Output Summary (Last 24 hours) at 11/21/15 1245 Last data filed at 11/21/15 0752  Gross per 24 hour  Intake                0 ml  Output              850 ml  Net             -850 ml    Exam VITAL SIGNS: Blood pressure (!) 147/88, pulse 93, temperature 98.8 F (37.1 C), temperature source Oral, resp. rate 16, height 5\' 5"  (1.651 m), weight 120 lb (54.4 kg), SpO2 94 %.  GENERAL:  82 y.o.-year-old patient lying in the bed with no acute distress.  EYES: Pupils equal, round, reactive to light and accommodation. No scleral icterus. Extraocular muscles intact.  HEENT: Head atraumatic, normocephalic. Oropharynx and nasopharynx clear.  NECK:  Supple, no jugular venous distention. No thyroid enlargement, no tenderness.  LUNGS: Normal breath sounds bilaterally, no wheezing, rales,rhonchi or crepitation. No use of accessory muscles of respiration.   CARDIOVASCULAR: S1, S2 normal. No murmurs, rubs, or gallops.  ABDOMEN: Soft, nontender, nondistended. Bowel sounds present. No organomegaly or mass.  EXTREMITIES: No pedal edema, cyanosis, or clubbing.  NEUROLOGIC: Cranial nerves II through XII are intact. Muscle strength 5/5 in all extremities. Sensation intact. Gait not checked.  PSYCHIATRIC: The patient is alert and oriented x 3.  SKIN: No obvious rash, lesion, or ulcer.   Data Review     CBC w Diff:  Lab Results  Component Value Date   WBC 7.2 11/21/2015   HGB 10.2 (L) 11/21/2015   HGB 10.4 (L) 03/30/2014   HCT 30.1 (L) 11/21/2015   HCT 31.9 (L) 03/30/2014   PLT 128 (L) 11/21/2015   PLT 184 03/30/2014   LYMPHOPCT 37 11/17/2015   LYMPHOPCT 28.4 03/30/2014   MONOPCT 9 11/17/2015   MONOPCT 6.1 03/30/2014   EOSPCT 3 11/17/2015   EOSPCT 1.4 03/30/2014   BASOPCT 1 11/17/2015   BASOPCT 1.6 03/30/2014   CMP:  Lab Results  Component Value Date   NA 137 11/20/2015   NA 144 01/04/2014   K 3.4 (L) 11/20/2015   K 3.7 01/04/2014   CL 108 11/20/2015   CL 112 (H) 01/04/2014   CO2 25 11/20/2015   CO2 25 01/04/2014  BUN 18 11/20/2015   BUN 22 (H) 01/04/2014   CREATININE 0.83 11/20/2015   CREATININE 0.94 01/04/2014   PROT 6.1 (L) 11/17/2015   PROT 6.3 (L) 01/04/2014   ALBUMIN 3.8 11/17/2015   ALBUMIN 3.2 (L) 01/04/2014   BILITOT 0.8 11/17/2015   BILITOT 0.4 01/04/2014   ALKPHOS 60 11/17/2015   ALKPHOS 57 01/04/2014   AST 34 11/17/2015   AST 28 01/04/2014   ALT 16 11/17/2015   ALT 19 01/04/2014  .  Micro Results Recent Results (from the past 240 hour(s))  Urine culture     Status: None   Collection Time: 11/17/15  8:09 PM  Result Value Ref Range Status   Specimen Description URINE, RANDOM  Final   Special Requests NONE  Final   Culture NO GROWTH [facility]   Final   Report Status 11/19/2015 FINAL  Final  MRSA PCR Screening     Status: None   Collection Time: 11/17/15 10:59 PM  Result  Value Ref Range Status   MRSA by PCR NEGATIVE NEGATIVE Final    Comment:        The GeneXpert MRSA Assay (FDA approved for NASAL specimens only), is one component of a comprehensive MRSA colonization surveillance program. It is not intended to diagnose MRSA infection nor to guide or monitor treatment for MRSA infections.      Code Status History    Date Active Date Inactive Code Status Order ID Comments User Context   11/17/2015 10:51 PM 11/18/2015  5:06 PM Full Code [id number]  Juanell FairlyKevin Krasinski, MD Inpatient   11/17/2015 10:51 PM 11/17/2015 10:51 PM Full Code [id number]  Kelsey NurseJohn D Johnston, MD Inpatient   11/14/2015  7:19 PM 11/16/2015  8:25 PM Full Code [id number]  Enedina FinnerSona Jahlani Lorentz, MD Inpatient   08/19/2014  2:52 PM 08/19/2014  6:42 PM Full Code [id number]  Kennedy BuckerMichael Menz, MD Inpatient           Contact information for follow-up providers    Juanell FairlyKRASINSKI, KEVIN, MD Follow up in 2 week(s).   Specialty:  Orthopedic Surgery Contact information: [address] [place] [address] [phone]        SPARKS,JEFFREY D, MD Follow up in 2 week(s).   Specialty:  Internal Medicine Contact information: [address] [facility] [place] [address] [phone]            Contact information for after-discharge care    Destination    [facility] Follow up.   Specialty:  Skilled Nursing Facility Contact information: [address] [place] [id number] [phone]                  Discharge Medications     Medication List    TAKE these medications   acetaminophen 500 MG tablet Commonly known as:  TYLENOL Take 500 mg by mouth as needed.   docusate sodium 100 MG capsule Commonly known as:  COLACE Take 1 capsule (100 mg total) by mouth 2 (two) times daily.   ferrous sulfate 325 (65 FE) MG tablet Take 1 tablet (325 mg total) by mouth 3 (three) times daily after meals.   HYDROcodone-acetaminophen 5-325 MG  tablet Commonly known as:  NORCO/VICODIN Take 1-2 tablets by mouth every 6 (six) hours as needed for moderate pain.   pantoprazole 40 MG tablet Commonly known as:  PROTONIX Take 1 tablet (40 mg total) by mouth 2 (two) times daily before a meal.   senna 8.6 MG Tabs  tablet Commonly known as:  SENOKOT Take 1 tablet (8.6 mg total) by mouth 2 (two) times daily.   vitamin B-12 1000 MCG tablet Commonly known as:  CYANOCOBALAMIN Take 1,000 mcg by mouth daily.          Total Time in preparing paper work, data evaluation and todays exam - 35 minutes  Auburn Bilberry M.D on 11/21/2015 at 12:45 PM  [facility]   Office  [phone]

## 2015-11-21 NOTE — Care Management Important Message (Signed)
Important Message  Patient Details  Name: Kelsey Le MRN: [id number] Date of Birth: 09/03/1930   Medicare Important Message Given:  Yes    Marily MemosLisa M Romelle Reiley, RN 11/21/2015, 12:21 PM

## 2015-11-21 NOTE — Discharge Instructions (Signed)
°  DIET:  Regular diet  DISCHARGE CONDITION:  Stable  ACTIVITY:  Activity as tolerated  OXYGEN:  Home Oxygen: Yes.     Oxygen Delivery: room air  DISCHARGE LOCATION:  nursing home    ADDITIONAL DISCHARGE INSTRUCTION: wound care and staple removal per othopedic instructions   If you experience worsening of your admission symptoms, develop shortness of breath, life threatening emergency, suicidal or homicidal thoughts you must seek medical attention immediately by calling 911 or calling your MD immediately  if symptoms less severe.  You Must read complete instructions/literature along with all the possible adverse reactions/side effects for all the Medicines you take and that have been prescribed to you. Take any new Medicines after you have completely understood and accpet all the possible adverse reactions/side effects.   Please note  You were cared for by a hospitalist during your hospital stay. If you have any questions about your discharge medications or the care you received while you were in the hospital after you are discharged, you can call the unit and asked to speak with the hospitalist on call if the hospitalist that took care of you is not available. Once you are discharged, your primary care physician will handle any further medical issues. Please note that NO REFILLS for any discharge medications will be authorized once you are discharged, as it is imperative that you return to your primary care physician (or establish a relationship with a primary care physician if you do not have one) for your aftercare needs so that they can reassess your need for medications and monitor your lab values.

## 2015-11-21 NOTE — Progress Notes (Signed)
  Subjective:  POD #3 s/p IM fixation for right hip fracture.  Patient reports pain as mild.  Has nausea but no vomiting.  Objective:   VITALS:   Vitals:   11/20/15 1945 11/21/15 0423 11/21/15 0745 11/21/15 1021  BP: (!) 137/57 (!) 150/52 (!) 147/88   Pulse: 95 96 (!) 109 93  Resp: 18 16    Temp: 98.4 F (36.9 C) 98.5 F (36.9 C) 98.8 F (37.1 C)   TempSrc: Oral Oral Oral   SpO2: 95% 96% 97% 94%  Weight:      Height:        PHYSICAL EXAM:  Right lower extremity:  Patient has scant spotting on her dressing of serosanguinous fluid. Neurologically intact Neurovascular intact Sensation intact distally Intact pulses distally Dorsiflexion/Plantar flexion intact Incision: dressing C/D/I No cellulitis present Compartment soft  LABS  Results for orders placed or performed during the hospital encounter of 11/17/15 (from the past 24 hour(s))  CBC     Status: Abnormal   Collection Time: 11/21/15  3:05 AM  Result Value Ref Range   WBC 7.2 3.6 - 11.0 K/uL   RBC 3.45 (L) 3.80 - 5.20 MIL/uL   Hemoglobin 10.2 (L) 12.0 - 16.0 g/dL   HCT 96.030.1 (L) 45.435.0 - 09.847.0 %   MCV 87.3 80.0 - 100.0 fL   MCH 29.5 26.0 - 34.0 pg   MCHC 33.7 32.0 - 36.0 g/dL   RDW 11.918.5 (H) 14.711.5 - 82.914.5 %   Platelets 128 (L) 150 - 440 K/uL    No results found.  Assessment/Plan: 3 Days Post-Op   Active Problems:   Hip fracture [facility](HCC)  Patient doing well from an orthopaedic standpoint.  Hemoglobin is stable.  Continue PT.   Patient may WBAT on right hip.  She should follow up with me in 10 days in the office for wound check, staple removal and xray.  No DVT prophylaxis due to history of AVM.      Juanell FairlyKRASINSKI, Martavius Lusty , MD 11/21/2015, 12:04 PM

## 2015-11-21 NOTE — Progress Notes (Signed)
[facility] called to [facility]sey [facility] at [facility]. EMS called for transportation.

## 2015-11-21 NOTE — Progress Notes (Signed)
Clinical Education officer, museum (CSW) confirmed with [facility] Must that patient has an existing SNF level [facility]. CSW met with patient and her daughter Juliann Pulse was at bedside. CSW presented bed offers. Patient and daughter chose [facility]. Patient can D/C to [facility] when stable.   [employer], LCSW [phone]

## 2015-11-21 NOTE — Progress Notes (Signed)
Physical Therapy Treatment Patient Details Name: Kelsey Le MRN: [id number] DOB: September 12, 1930 Today's Date: 11/21/2015    History of Present Illness 82 year old female who was just discharged yesterday from the hospital after undergoing treatment for a GI bleed. She was walking her dog earlier today and tripped over the curb and fell backwards and has now been diagnosed with a right hip fracture with surgical repair performed yesterday.    PT Comments    Pt agreeable to PT; seen for 11 min for exercises then pt required break due to nausea. PT seen again a little while later with ability to continue PT. Pt demonstrates progress in all exercises, bed mobility, transfers and gait. Pt requires increased time for all activities to decrease anxiety and function with improved technique and confidence. Pt requires cueing for sequencing for all mobility as well. Assist required for Right lower extremity exercises as needed. Pt received up in chair; mild nausea noted again; pt does not desire medication only a Coke, which she notes is helpful. Plan to see pt this afternoon if pt is not discharged to skilled nursing facility today to continue progress of strength, endurance and balance to improve all functional mobility.   Follow Up Recommendations  SNF     Equipment Recommendations  Rolling walker with 5" wheels    Recommendations for Other Services       Precautions / Restrictions Precautions Precautions: Fall Restrictions Weight Bearing Restrictions: Yes Other Position/Activity Restrictions: WBAT RLE    Mobility  Bed Mobility Overal bed mobility: Needs Assistance Bed Mobility: Supine to Sit     Supine to sit: Min assist     General bed mobility comments: Cues for sequence to bridge hips to edge of bed, legs over edge with assist and UE sequence for trunk elevation with assist  Transfers Overall transfer level: Needs assistance Equipment used: Rolling walker (2  wheeled) Transfers: Sit to/from Stand Sit to Stand: Min assist         General transfer comment: Min A from bed and elevated commode with cues for hand placement and complete extension of knee and hips to neutral extension  Ambulation/Gait Ambulation/Gait assistance: Min guard;Min assist Ambulation Distance (Feet): 30 Feet (35 ft second walk) Assistive device: Rolling walker (2 wheeled) Gait Pattern/deviations: Step-to pattern Gait velocity: slow Gait velocity interpretation: <1.8 ft/sec, indicative of risk for recurrent falls General Gait Details: Cues for step lengths, sequence and appropriate distance for rw intermittently throughout walks   Stairs            Wheelchair Mobility    Modified Rankin (Stroke Patients Only)       Balance   Sitting-balance support: Feet supported;Bilateral upper extremity supported Sitting balance-[name] Scale: Good     Standing balance support: Bilateral upper extremity supported Standing balance-[name] Scale: Fair                      Cognition Arousal/Alertness: Awake/alert Behavior During Therapy: WFL for tasks assessed/performed Overall Cognitive Status: Within Functional Limits for tasks assessed                      Exercises General Exercises - Lower Extremity Ankle Circles/Pumps: AROM;Both;20 reps;Supine Quad Sets: Strengthening;Both;20 reps;Supine Gluteal Sets: Strengthening;Both;20 reps;Supine Short Arc Quad: AROM;Both;20 reps;Supine Heel Slides: AAROM;Right;20 reps;Supine (AROM L) Hip ABduction/ADduction: AAROM;Right;20 reps;Supine (AROM L) Straight Leg Raises: AAROM;Both;10 reps;Supine Other Exercises Other Exercises: Shifting weight to R in seated position; stand wt shifts with QS pre ambulation  Other Exercises: Supervision toileting; Min guard during hand hygiene    General Comments        Pertinent Vitals/Pain Pain Assessment: 0-10 Pain Score: 8  Pain Location: R groin/hip with  exercise/ambulation Pain Intervention(s): Monitored during session;Premedicated before session;Repositioned    Home Living                      Prior Function            PT Goals (current goals can now be found in the care plan section) Progress towards PT goals: Progressing toward goals    Frequency    BID      PT Plan Current plan remains appropriate    Co-evaluation             End of Session Equipment Utilized During Treatment: Gait belt Activity Tolerance: Patient tolerated treatment well Patient left: in chair;with call bell/phone within reach;with chair alarm set;with family/visitor present     Time: 1056 (Pt also seen 10:21 - 10:32; required break due to nausea)-1130 PT Time Calculation (min) (ACUTE ONLY): 34 min  Charges:  $Gait Training: 8-22 mins $Therapeutic Exercise: 8-22 mins $Therapeutic Activity: 8-22 mins                    G C[place]:      Kimela Malstrom E Misao Fackrell, PTA 11/21/2015, 12:26 PM

## 2015-11-21 NOTE — Progress Notes (Signed)
Patient is medically stable for [facility]. RN will call report at [phone] and arrange EMS for transport. Clinical Child psychotherapistocial Worker (CSW) sent D/C orders to [employer] via [employer]. Patient is aware of above. Patient's daughter [name] is at bedside and aware of D/C today. Please reconsult if future social work needs arise. CSW signing off.   [employer] Hughes IncorporatedBailey Vika Buske, LCSW [phone]

## 2015-11-21 NOTE — Progress Notes (Signed)
CH rounding the unit visited Pt who was with his fiance in the room. Pt talked about her health, recovery, and great hospital experience. CH asked Pt if she had any other concerns she wanted [facility] to know about, Pt said she had no concern. CH thanked the Pt and her fiance then left.      11/21/15 1400  Clinical Encounter Type  Visited With Patient  Visit Type Initial  Referral From Nurse  Consult/Referral To Chaplain

## 2015-11-21 NOTE — Clinical Social Work Placement (Signed)
   CLINICAL SOCIAL WORK PLACEMENT  NOTE  Date:  11/21/2015  Patient Details  Name: Kelsey Le MRN: [id number] Date of Birth: 1930/01/23  Clinical Social Work is seeking post-discharge placement for this patient at the Skilled  Nursing Facility level of care (*CSW will initial, date and re-position this form in  chart as items are completed):  Yes   Patient/family provided with [place] Clinical Social Work Department's list of facilities offering this level of care within the geographic area requested by the patient (or if unable, by the patient's family).  Yes   Patient/family informed of their freedom to choose among providers that offer the needed level of care, that participate in Medicare, Medicaid or managed care program needed by the patient, have an available bed and are willing to accept the patient.  Yes   Patient/family informed of [place]'s ownership interest [facility] and [facility], as well as of the fact that they are under no obligation to receive care at these facilities.  PASRR submitted to EDS on       PASRR number received on       Existing PASRR number confirmed on 11/19/15     FL2 transmitted to all facilities in geographic area requested by pt/family on 11/19/15     FL2 transmitted to all facilities within larger geographic area on       Patient informed that his/her managed care company has contracts with or will negotiate with certain facilities, including the following:        Yes   Patient/family informed of bed offers received.  Patient [facility] at  [facility]     Physician recommends and patient chooses bed at      Patient to be transferred to  [facility] ) on 11/21/15.  Patient to be transferred to facility by  [facility] )     Patient family notified on 11/21/15 of transfer.  Name of family member notified:   (Patient's daughter [name] is aware of D/C today. )     PHYSICIAN       Additional  Comment:    _______________________________________________ Ashtyn Meland, Darleen CrockerBailey M, LCSW 11/21/2015, 1:18 PM

## 2015-11-23 LAB — CBC WITH DIFFERENTIAL/PLATELET
Basophils Absolute: 0.1 10*3/uL (ref 0–0.1)
Basophils Relative: 1 %
Eosinophils Absolute: 0.1 10*3/uL (ref 0–0.7)
Eosinophils Relative: 1 %
HEMATOCRIT: 30.1 % — AB (ref 35.0–47.0)
HEMOGLOBIN: 10.4 g/dL — AB (ref 12.0–16.0)
LYMPHS PCT: 7 %
Lymphs Abs: 0.7 10*3/uL — ABNORMAL LOW (ref 1.0–3.6)
MCH: 31.4 pg (ref 26.0–34.0)
MCHC: 34.5 g/dL (ref 32.0–36.0)
MCV: 90.8 fL (ref 80.0–100.0)
MONO ABS: 0.8 10*3/uL (ref 0.2–0.9)
MONOS PCT: 9 %
NEUTROS ABS: 7.7 10*3/uL — AB (ref 1.4–6.5)
NEUTROS PCT: 82 %
Platelets: 215 10*3/uL (ref 150–440)
RBC: 3.31 MIL/uL — ABNORMAL LOW (ref 3.80–5.20)
RDW: 18.5 % — AB (ref 11.5–14.5)
WBC: 9.5 10*3/uL (ref 3.6–11.0)

## 2015-11-23 LAB — BASIC METABOLIC PANEL
ANION GAP: 10 (ref 5–15)
BUN: 26 mg/dL — ABNORMAL HIGH (ref 6–20)
CALCIUM: 8.3 mg/dL — AB (ref 8.9–10.3)
CHLORIDE: 103 mmol/L (ref 101–111)
CO2: 24 mmol/L (ref 22–32)
CREATININE: 0.8 mg/dL (ref 0.44–1.00)
GFR calc Af Amer: >60 mL/min (ref >60)
GFR calc non Af Amer: >60 mL/min (ref >60)
GLUCOSE: 130 mg/dL — AB (ref 65–99)
Potassium: 3.3 mmol/L — ABNORMAL LOW (ref 3.5–5.1)
Sodium: 137 mmol/L (ref 135–145)

## 2015-11-29 LAB — BASIC METABOLIC PANEL
ANION GAP: 10 (ref 5–15)
BUN: 28 mg/dL — AB (ref 6–20)
CALCIUM: 9 mg/dL (ref 8.9–10.3)
CO2: 22 mmol/L (ref 22–32)
Chloride: 109 mmol/L (ref 101–111)
Creatinine, Ser: 0.92 mg/dL (ref 0.44–1.00)
GFR calc Af Amer: >60 mL/min (ref >60)
GFR, EST NON AFRICAN AMERICAN: 56 mL/min — AB (ref >60)
GLUCOSE: 122 mg/dL — AB (ref 65–99)
POTASSIUM: 3.4 mmol/L — AB (ref 3.5–5.1)
SODIUM: 141 mmol/L (ref 135–145)

## 2015-11-29 LAB — CBC WITH DIFFERENTIAL/PLATELET
BASOS ABS: 0.1 10*3/uL (ref 0–0.1)
Basophils Relative: 1 %
EOS ABS: 0.1 10*3/uL (ref 0–0.7)
EOS PCT: 2 %
HCT: 31.7 % — ABNORMAL LOW (ref 35.0–47.0)
Hemoglobin: 10.7 g/dL — ABNORMAL LOW (ref 12.0–16.0)
Lymphocytes Relative: 23 %
Lymphs Abs: 1.8 10*3/uL (ref 1.0–3.6)
MCH: 31 pg (ref 26.0–34.0)
MCHC: 33.6 g/dL (ref 32.0–36.0)
MCV: 92.1 fL (ref 80.0–100.0)
MONO ABS: 0.6 10*3/uL (ref 0.2–0.9)
Monocytes Relative: 8 %
Neutro Abs: 5 10*3/uL (ref 1.4–6.5)
Neutrophils Relative %: 66 %
PLATELETS: 294 10*3/uL (ref 150–440)
RBC: 3.44 MIL/uL — AB (ref 3.80–5.20)
RDW: 18.8 % — AB (ref 11.5–14.5)
WBC: 7.7 10*3/uL (ref 3.6–11.0)

## 2015-12-01 LAB — BASIC METABOLIC PANEL
ANION GAP: 7 (ref 5–15)
BUN: 30 mg/dL — AB (ref 6–20)
CHLORIDE: 107 mmol/L (ref 101–111)
CO2: 28 mmol/L (ref 22–32)
Calcium: 9 mg/dL (ref 8.9–10.3)
Creatinine, Ser: 0.93 mg/dL (ref 0.44–1.00)
GFR calc Af Amer: >60 mL/min (ref >60)
GFR calc non Af Amer: 55 mL/min — ABNORMAL LOW (ref >60)
GLUCOSE: 72 mg/dL (ref 65–99)
POTASSIUM: 4.4 mmol/L (ref 3.5–5.1)
Sodium: 142 mmol/L (ref 135–145)

## 2015-12-01 LAB — CBC WITH DIFFERENTIAL/PLATELET
Basophils Absolute: 0.1 10*3/uL (ref 0–0.1)
Basophils Relative: 2 %
EOS PCT: 3 %
Eosinophils Absolute: 0.2 10*3/uL (ref 0–0.7)
HEMATOCRIT: 31 % — AB (ref 35.0–47.0)
HEMOGLOBIN: 10.2 g/dL — AB (ref 12.0–16.0)
LYMPHS ABS: 1.7 10*3/uL (ref 1.0–3.6)
LYMPHS PCT: 28 %
MCH: 30.3 pg (ref 26.0–34.0)
MCHC: 33 g/dL (ref 32.0–36.0)
MCV: 91.9 fL (ref 80.0–100.0)
Monocytes Absolute: 0.4 10*3/uL (ref 0.2–0.9)
Monocytes Relative: 7 %
NEUTROS ABS: 3.6 10*3/uL (ref 1.4–6.5)
Neutrophils Relative %: 60 %
PLATELETS: 283 10*3/uL (ref 150–440)
RBC: 3.37 MIL/uL — AB (ref 3.80–5.20)
RDW: 19.2 % — ABNORMAL HIGH (ref 11.5–14.5)
WBC: 6 10*3/uL (ref 3.6–11.0)

## 2015-12-02 ENCOUNTER — Encounter
Admission: RE | Admit: 2015-12-02 | Discharge: 2015-12-02 | Disposition: A | Discharge status: Home / Self Care | Payer: Medicare Other | Source: Ambulatory Visit | Attending: Internal Medicine | Admitting: Internal Medicine

## 2015-12-06 ENCOUNTER — Non-Acute Institutional Stay (SKILLED_NURSING_FACILITY): Payer: Medicare Other | Admitting: Gerontology

## 2015-12-06 DIAGNOSIS — S72001D Fracture of unspecified part of neck of right femur, subsequent encounter for closed fracture with routine healing: Secondary | ICD-10-CM

## 2015-12-06 DIAGNOSIS — G8918 Other acute postprocedural pain: Secondary | ICD-10-CM

## 2015-12-14 ENCOUNTER — Ambulatory Visit: Payer: Medicare Other

## 2015-12-19 NOTE — Progress Notes (Signed)
Location:      Place of Service:  SNF (31) Provider:  Toni Arthurs, NP-C  SPARKS,JEFFREY D, MD  Patient Care Team: Idelle Crouch, MD as PCP - General (Internal Medicine)  Extended Emergency Contact Information Primary Emergency Contact: Prentice Docker Address: [place]          [place], [place] [address] Johnnette Litter of [place] Phone: [phone] Mobile Phone: [phone] Relation: Son Secondary Emergency Contact: Lilia Argue [place] of [place] Phone: [phone] Mobile Phone: [phone] Relation: Daughter  Code Status:   full Goals of care: Advanced Directive information Advanced Directives 11/18/2015  Does Patient Have a Medical Advance Directive? No  Would patient like information on creating a medical advance directive? No - patient declined information     Chief Complaint  Patient presents with  . Follow-up    HPI:  Pt is a 82 y.o. female seen today for a hospital f/u s/p admission from [facility] status post right hip fracture with subsequent anemia and hyponatremia. Patient is progressing well with PT/OT. Vital signs have remained stable. Plasma sodium levels have remained stable in the normal range. Hemoglobin has remained stable with most recent value of 10.2. Patient reports her pain is well controlled , as long as she remembers to ask for pain medication when needed.  Patient is smiling during the conversation and does not appear to be in any distress. Patient denies calf pain or edema. Patient denies chest pain and shortness of breath. Vital signs stable. No other complaints.  Past Medical History:  Diagnosis Date  . Arrhythmia   . Asthma   . Broken back   . Broken wrist   . Broken wrist   . Fractured hip ([place])   . Heart murmur   . Syncope and collapse    Past Surgical History:  Procedure Laterality Date  . APPENDECTOMY    . BACK SURGERY     back fusion  . ESOPHAGOGASTRODUODENOSCOPY (EGD) WITH PROPOFOL N/A 11/15/2015    Procedure: ESOPHAGOGASTRODUODENOSCOPY (EGD) WITH PROPOFOL;  Surgeon: Lucilla Lame, MD;  Location: [facility] ENDOSCOPY;  Service: Endoscopy;  Laterality: N/A;  . HIP FRACTURE SURGERY    . INTRAMEDULLARY (IM) NAIL INTERTROCHANTERIC Right 11/18/2015   Procedure: INTRAMEDULLARY (IM) NAIL INTERTROCHANTRIC;  Surgeon: Thornton Park, MD;  Location: [facility] ORS;  Service: Orthopedics;  Laterality: Right;  . KYPHOPLASTY N/A 08/19/2014   Procedure: KYPHOPLASTY;  Surgeon: Hessie Knows, MD;  Location: [facility] ORS;  Service: Orthopedics;  Laterality: N/A;  . PARTIAL HYSTERECTOMY    . TUBAL LIGATION      Allergies  Allergen Reactions  . Penicillins     antibiotics    Allergies as of 12/06/2015      Reactions   Penicillins    antibiotics      Medication List       Accurate as of 12/06/15 11:59 PM. Always use your most recent med list.          acetaminophen 500 MG tablet Commonly known as:  TYLENOL Take 500 mg by mouth as needed.   docusate sodium 100 MG capsule Commonly known as:  COLACE Take 1 capsule (100 mg total) by mouth 2 (two) times daily.   ferrous sulfate 325 (65 FE) MG tablet Take 1 tablet (325 mg total) by mouth 3 (three) times daily after meals.   HYDROcodone-acetaminophen 5-325 MG tablet Commonly known as:  NORCO/VICODIN Take 1-2 tablets by mouth every 6 (six) hours as needed for moderate pain.   pantoprazole 40  MG tablet Commonly known as:  PROTONIX Take 1 tablet (40 mg total) by mouth 2 (two) times daily before a meal.   senna 8.6 MG Tabs tablet Commonly known as:  SENOKOT Take 1 tablet (8.6 mg total) by mouth 2 (two) times daily.   vitamin B-12 1000 MCG tablet Commonly known as:  CYANOCOBALAMIN Take 1,000 mcg by mouth daily.       Review of Systems  Constitutional: Negative for activity change, appetite change, chills, diaphoresis and fever.  HENT: Negative for congestion, sneezing, sore throat, trouble swallowing and voice change.   Eyes: Negative.     Respiratory: Negative for apnea, cough, choking, chest tightness, shortness of breath and wheezing.   Cardiovascular: Negative for chest pain, palpitations and leg swelling.  Gastrointestinal: Negative for abdominal distention, abdominal pain, constipation, diarrhea and nausea.  Genitourinary: Negative for difficulty urinating, dysuria, frequency and urgency.  Musculoskeletal: Positive for arthralgias (typical arthritis). Negative for back pain, gait problem and myalgias.  Skin: Positive for wound. Negative for color change, pallor and rash.  Neurological: Negative for dizziness, tremors, syncope, speech difficulty, weakness, numbness and headaches.  Psychiatric/Behavioral: Negative.   All other systems reviewed and are negative.    There is no immunization history on file for this patient. Pertinent  Health Maintenance Due  Topic Date Due  . DEXA SCAN  07/17/1997  . PNA vac Low Risk Adult (1 of 2 - PCV13) 07/17/1997  . INFLUENZA VACCINE  08/02/2015   No flowsheet data found. Functional Status Survey:    Vitals:   12/06/15 0430  BP: (!) 127/53  Pulse: 71  Resp: 20  Temp: 98.3 F (36.8 C)  SpO2: 98%   There is no height or weight on file to calculate BMI. Physical Exam  Constitutional: She is oriented to person, place, and time. Vital signs are normal. She appears well-developed and well-nourished. She is active and cooperative. She does not appear ill. No distress.  HENT:  Head: Normocephalic and atraumatic.  Mouth/Throat: Uvula is midline, oropharynx is clear and moist and mucous membranes are normal. Mucous membranes are not pale, not dry and not cyanotic.  Eyes: Conjunctivae, EOM and lids are normal. Pupils are equal, round, and reactive to light.  Neck: Trachea normal, normal range of motion and full passive range of motion without pain. Neck supple. No JVD present. No tracheal deviation, no edema and no erythema present. No thyromegaly present.  Cardiovascular: Normal  rate, regular rhythm, intact distal pulses and normal pulses.  Exam reveals no gallop, no distant heart sounds and no friction rub.   Murmur heard. Pulmonary/Chest: Effort normal and breath sounds normal. No accessory muscle usage. No respiratory distress. She has no decreased breath sounds. She has no wheezes. She has no rhonchi. She has no rales. She exhibits no tenderness.  Abdominal: Normal appearance and bowel sounds are normal. She exhibits no distension and no ascites. There is no tenderness.  Musculoskeletal: She exhibits no edema.       Right hip: She exhibits decreased range of motion, decreased strength and laceration (incision).       Right knee: She exhibits decreased range of motion. Tenderness found.  Expected osteoarthritis, stiffness. Calves soft, supple. Negative Homan's sign  Neurological: She is alert and oriented to person, place, and time. She has normal strength.  Skin: Skin is warm, dry and intact. She is not diaphoretic. No cyanosis. No pallor. Nails show no clubbing.  Psychiatric: She has a normal mood and affect. Her speech is normal  and behavior is normal. Judgment and thought content normal. Cognition and memory are normal.  Nursing note and vitals reviewed.   Labs reviewed:  Recent Labs  11/23/15 1435 11/29/15 0700 12/01/15 0700  NA 137 141 142  K 3.3* 3.4* 4.4  CL 103 109 107  CO2 _0 GLUCOSE 130* 122* 72  BUN 26* 28* 30*  CREATININE 0.80 0.92 0.93  CALCIUM 8.3* 9.0 9.0    Recent Labs  11/14/15 1550 11/17/15 1811  AST 28 34  ALT 13* 16  ALKPHOS 48 60  BILITOT 0.6 0.8  PROT 5.7* 6.1*  ALBUMIN 3.4* 3.8    Recent Labs  11/23/15 1435 11/29/15 0700 12/01/15 0700  WBC 9.5 7.7 6.0  NEUTROABS 7.7* 5.0 3.6  HGB 10.4* 10.7* 10.2*  HCT 30.1* 31.7* 31.0*  MCV 90.8 92.1 91.9  PLT 215 294 283   No results found for: TSH No results found for: HGBA1C No results found for: CHOL, HDL, LDLCALC, LDLDIRECT, TRIG, CHOLHDL  Significant  Diagnostic Results in last 30 days:  No results found.  Assessment/Plan 1. Closed fracture of right hip with routine healing, subsequent encounter   continue working with physical therapy and occupational therapy    encourage patient to ask for pain medication when needed    encourage patient to ask staff for assistance hen needed    ensure supplements 1 bottle twice daily for nutrition supplementation    no pharmacological anticoagulant for DVT prophylaxis a this time due to recent hospitaliaztion for GI bleed  2. Pain, acute postoperative  Voltaren 1% gel 4 g topically to right knee 4 times a day for pain    Norco 5/325 1-2 tablets by mouth every 6 hours when necessary pain   Ice to right hip when necessary   Family/ staff Communication:   Total Time:  Documentation:  Face to Face:  Family/Phone:   Labs/tests ordered:   CBC, met B  Vikki Ports, NP-C [facility] [address], [place] [address] Cell Phone (Mon-Fri 8am-5pm):  [phone] On Call:  [phone] & follow prompts after 5pm & weekends Office Phone:  [phone] Office Fax:  [phone]

## 2016-01-15 DIAGNOSIS — D509 Iron deficiency anemia, unspecified: Secondary | ICD-10-CM | POA: Insufficient documentation

## 2016-01-15 NOTE — Progress Notes (Signed)
[facility]  [phone]  [phone]) [phone]     Kelsey Le DOB: 10-17-1930  MR#: [id number]  UXL#:[id number]  Patient Care Team: Marguarite Arbour, MD as PCP - General (Internal Medicine)  Anemia of iron deficiency, patient intolerant of oral iron (cannot take even over-the-counter low-dose iron preparation due to severe GI upset), presented during hospitalization in January 2016 with hemoglobin of 5.5/hematocrit 18.4 and received blood transfusion support. Also started parenteral iron therapy on January 05, 2014, with Venofer 500 mg IV x1  INTERVAL HISTORY: Patient is here for routine follow up visit for iron deficiency anemia. She currently feels "rough" after recent hospitalization for right femur fracture and blood transfusion support.  She has been getting physical therapy, and is currently walking with a front-wheel walker.  She reports 4/10 pain in her right hip and right knee.  She denies any other pain.  She reports chronic swelling in both ankles, controlled with compression stockings. She reports constant fatigue and current boredom, currently not medically cleared to drive or walk her dog.  She denies dizziness or shortness of breath. However, her blood pressure goes up and down and she does get these symptoms often if her blood pressure is off.  She denies fever, chills, or recent illnesses.  She denies nausea, vomiting, constipation, or diarrhea.  Her appetite is improving.  She has no further complaints today.   REVIEW OF SYSTEMS:   Review of Systems  Constitutional: Positive for malaise/fatigue. Negative for chills and fever.  HENT: Negative.   Eyes: Negative.   Respiratory: Negative.   Cardiovascular: Positive for leg swelling. Negative for chest pain and palpitations.  Gastrointestinal: Negative.  Negative for constipation, diarrhea, nausea and vomiting.  Genitourinary: Negative.   Musculoskeletal: Positive for joint pain.  Skin: Negative.     Neurological: Negative.  Negative for dizziness, weakness and headaches.  Endo/Heme/Allergies: Negative.   Psychiatric/Behavioral: Negative.     As per HPI. Otherwise, a complete review of systems is negative.  ONCOLOGY HISTORY:  No history exists.    PAST MEDICAL HISTORY: Past Medical History:  Diagnosis Date  . Arrhythmia   . Asthma   . Broken back   . Broken wrist   . Broken wrist   . Fractured hip (HCC)   . Heart murmur   . Syncope and collapse     PAST SURGICAL HISTORY: Past Surgical History:  Procedure Laterality Date  . APPENDECTOMY    . BACK SURGERY     back fusion  . ESOPHAGOGASTRODUODENOSCOPY (EGD) WITH PROPOFOL N/A 11/15/2015   Procedure: ESOPHAGOGASTRODUODENOSCOPY (EGD) WITH PROPOFOL;  Surgeon: [name], MD;  Location: [facility]COPY;  Service: Endoscopy;  Laterality: N/A;  . HIP FRACTURE SURGERY    . INTRAMEDULLARY (IM) NAIL INTERTROCHANTERIC Right 11/18/2015   Procedure: INTRAMEDULLARY (IM) NAIL INTERTROCHANTRIC;  Surgeon: Juanell Fairly, MD;  Location: [facility] ORS;  Service: Orthopedics;  Laterality: Right;  . KYPHOPLASTY N/A 08/19/2014   Procedure: KYPHOPLASTY;  Surgeon: Kennedy Bucker, MD;  Location: [facility] ORS;  Service: Orthopedics;  Laterality: N/A;  . PARTIAL HYSTERECTOMY    . TUBAL LIGATION      FAMILY HISTORY Family History  Problem Relation Age of Onset  . Heart disease Sister   . Breast cancer Maternal Aunt     GYNECOLOGIC HISTORY:  No LMP recorded. Patient is postmenopausal.     ADVANCED DIRECTIVES:    HEALTH MAINTENANCE: Social History  Substance Use Topics  . Smoking status: Never Smoker  . Smokeless tobacco: Never Used  .  Alcohol use No    Allergies  Allergen Reactions  . Penicillins     antibiotics    Current Outpatient Prescriptions  Medication Sig Dispense Refill  . acetaminophen (TYLENOL) 500 MG tablet Take 500 mg by mouth as needed.     . vitamin B-12 (CYANOCOBALAMIN) 1000 MCG tablet Take 1,000 mcg by mouth daily.     Marland Kitchen. docusate sodium (COLACE) 100 MG capsule Take 1 capsule (100 mg total) by mouth 2 (two) times daily. (Patient not taking: Reported on 01/16/2016) 10 capsule 0  . ferrous sulfate 325 (65 FE) MG tablet Take 1 tablet (325 mg total) by mouth 3 (three) times daily after meals. (Patient not taking: Reported on 01/16/2016)  3  . HYDROcodone-acetaminophen (NORCO/VICODIN) 5-325 MG tablet Take 1-2 tablets by mouth every 6 (six) hours as needed for moderate pain. (Patient not taking: Reported on 01/16/2016) 30 tablet 0  . pantoprazole (PROTONIX) 40 MG tablet Take 1 tablet (40 mg total) by mouth 2 (two) times daily before a meal. (Patient not taking: Reported on 01/16/2016)    . senna (SENOKOT) 8.6 MG TABS tablet Take 1 tablet (8.6 mg total) by mouth 2 (two) times daily. (Patient not taking: Reported on 01/16/2016) 120 each 0   No current facility-administered medications for this visit.     OBJECTIVE: BP (!) 144/64 (BP Location: Left Arm, Patient Position: Sitting)   Pulse 72   Temp 97.9 F (36.6 C) (Tympanic)   Resp 18   Wt 113 lb 15.7 oz (51.7 kg)   BMI 18.97 kg/m    Body mass index is 18.97 kg/m.    ECOG FS:0 - Asymptomatic  General: Well-developed, well-nourished, no acute distress. Eyes: Pink conjunctiva, anicteric sclera. HEENT: Normocephalic, moist mucous membranes, clear oropharnyx. Lungs: Clear to auscultation bilaterally. Heart: Regular rate and rhythm. Positive for murmur Musculoskeletal: Non-pitting edema in bilateral ankles. No cyanosis or clubbing. Neuro: Alert, answering all questions appropriately.  Skin: No rashes or petechiae noted. Psych: Normal affect.   LAB RESULTS:  Appointment on 01/16/2016  Component Date Value Ref Range Status  . WBC 01/16/2016 5.4  3.6 - 11.0 K/uL Final  . RBC 01/16/2016 3.03* 3.80 - 5.20 MIL/uL Final  . Hemoglobin 01/16/2016 9.4* 12.0 - 16.0 g/dL Final  . HCT 28/41/324401/15/2018 28.1* 35.0 - 47.0 % Final  . MCV 01/16/2016 92.6  80.0 - 100.0 fL Final  .  MCH 01/16/2016 30.9  26.0 - 34.0 pg Final  . MCHC 01/16/2016 33.4  32.0 - 36.0 g/dL Final  . RDW 01/02/725301/15/2018 16.6* 11.5 - 14.5 % Final  . Platelets 01/16/2016 165  150 - 440 K/uL Final  . Neutrophils Relative % 01/16/2016 70  % Final  . Neutro Abs 01/16/2016 3.7  1.4 - 6.5 K/uL Final  . Lymphocytes Relative 01/16/2016 21  % Final  . Lymphs Abs 01/16/2016 1.2  1.0 - 3.6 K/uL Final  . Monocytes Relative 01/16/2016 7  % Final  . Monocytes Absolute 01/16/2016 0.4  0.2 - 0.9 K/uL Final  . Eosinophils Relative 01/16/2016 1  % Final  . Eosinophils Absolute 01/16/2016 0.1  0 - 0.7 K/uL Final  . Basophils Relative 01/16/2016 1  % Final  . Basophils Absolute 01/16/2016 0.1  0 - 0.1 K/uL Final  . Iron 01/16/2016 61  28 - 170 ug/dL Final  . TIBC 66/44/034701/15/2018 262  250 - 450 ug/dL Final  . Saturation Ratios 01/16/2016 23  10.4 - 31.8 % Final  . UIBC 01/16/2016 201  ug/dL Final  .  Ferritin 01/16/2016 271  11 - 307 ng/mL Final    STUDIES: No results found.  ASSESSMENT: Iron deficiency anemia.  PLAN:    1. Iron Deficiency Anemia: History of intermittent dark stools, recently reported negative EGD, colonoscopy, capsule endoscopy completed in [place] in 2015. Patient has last received 1 g dose of IV Venofer in January 2016. Her HGB today is 9.4, iron studies WNL today. Return to clinic in 3 months with repeat labs.  2. Fractured femur: Continue physical therapy and rehabilitation per orthopedics.  Patient expressed understanding and was in agreement with this plan. She also understands that She can call clinic at any time with any questions, concerns, or complaints.   Linna Darner, NP  01/16/16 11:57 AM   Patient was seen and evaluated independently and I agree with the assessment and plan as dictated above.  Jeralyn Ruths, MD 01/16/16 12:02 PM

## 2016-01-16 ENCOUNTER — Inpatient Hospital Stay: Payer: Medicare Other

## 2016-01-16 ENCOUNTER — Inpatient Hospital Stay: Payer: Medicare Other | Attending: Oncology | Admitting: Oncology

## 2016-01-16 DIAGNOSIS — D509 Iron deficiency anemia, unspecified: Secondary | ICD-10-CM

## 2016-01-16 DIAGNOSIS — M25471 Effusion, right ankle: Secondary | ICD-10-CM

## 2016-01-16 DIAGNOSIS — M25472 Effusion, left ankle: Secondary | ICD-10-CM | POA: Diagnosis not present

## 2016-01-16 DIAGNOSIS — S7291XS Unspecified fracture of right femur, sequela: Secondary | ICD-10-CM | POA: Diagnosis not present

## 2016-01-16 DIAGNOSIS — I499 Cardiac arrhythmia, unspecified: Secondary | ICD-10-CM

## 2016-01-16 DIAGNOSIS — Z9049 Acquired absence of other specified parts of digestive tract: Secondary | ICD-10-CM | POA: Diagnosis not present

## 2016-01-16 DIAGNOSIS — Z803 Family history of malignant neoplasm of breast: Secondary | ICD-10-CM | POA: Diagnosis not present

## 2016-01-16 DIAGNOSIS — R5383 Other fatigue: Secondary | ICD-10-CM | POA: Diagnosis not present

## 2016-01-16 DIAGNOSIS — R531 Weakness: Secondary | ICD-10-CM | POA: Diagnosis not present

## 2016-01-16 DIAGNOSIS — J45909 Unspecified asthma, uncomplicated: Secondary | ICD-10-CM | POA: Diagnosis not present

## 2016-01-16 DIAGNOSIS — D508 Other iron deficiency anemias: Secondary | ICD-10-CM

## 2016-01-16 LAB — CBC WITH DIFFERENTIAL/PLATELET
Basophils Absolute: 0.1 10*3/uL (ref 0–0.1)
Basophils Relative: 1 %
EOS PCT: 1 %
Eosinophils Absolute: 0.1 10*3/uL (ref 0–0.7)
HCT: 28.1 % — ABNORMAL LOW (ref 35.0–47.0)
Hemoglobin: 9.4 g/dL — ABNORMAL LOW (ref 12.0–16.0)
LYMPHS ABS: 1.2 10*3/uL (ref 1.0–3.6)
LYMPHS PCT: 21 %
MCH: 30.9 pg (ref 26.0–34.0)
MCHC: 33.4 g/dL (ref 32.0–36.0)
MCV: 92.6 fL (ref 80.0–100.0)
Monocytes Absolute: 0.4 10*3/uL (ref 0.2–0.9)
Monocytes Relative: 7 %
Neutro Abs: 3.7 10*3/uL (ref 1.4–6.5)
Neutrophils Relative %: 70 %
PLATELETS: 165 10*3/uL (ref 150–440)
RBC: 3.03 MIL/uL — AB (ref 3.80–5.20)
RDW: 16.6 % — ABNORMAL HIGH (ref 11.5–14.5)
WBC: 5.4 10*3/uL (ref 3.6–11.0)

## 2016-01-16 LAB — IRON AND TIBC
Iron: 61 ug/dL (ref 28–170)
Saturation Ratios: 23 % (ref 10.4–31.8)
TIBC: 262 ug/dL (ref 250–450)
UIBC: 201 ug/dL

## 2016-01-16 LAB — FERRITIN: FERRITIN: 271 ng/mL (ref 11–307)

## 2016-01-25 ENCOUNTER — Inpatient Hospital Stay: Payer: Medicare Other

## 2016-01-25 ENCOUNTER — Other Ambulatory Visit: Payer: Self-pay | Admitting: *Deleted

## 2016-01-25 ENCOUNTER — Telehealth: Payer: Self-pay | Admitting: *Deleted

## 2016-01-25 DIAGNOSIS — D508 Other iron deficiency anemias: Secondary | ICD-10-CM

## 2016-01-25 DIAGNOSIS — D509 Iron deficiency anemia, unspecified: Secondary | ICD-10-CM | POA: Diagnosis not present

## 2016-01-25 LAB — CBC WITH DIFFERENTIAL/PLATELET
BASOS ABS: 0 10*3/uL (ref 0–0.1)
Basophils Relative: 1 %
EOS ABS: 0 10*3/uL (ref 0–0.7)
EOS PCT: 1 %
HCT: 22.5 % — ABNORMAL LOW (ref 35.0–47.0)
Hemoglobin: 7.5 g/dL — ABNORMAL LOW (ref 12.0–16.0)
Lymphocytes Relative: 17 %
Lymphs Abs: 0.9 10*3/uL — ABNORMAL LOW (ref 1.0–3.6)
MCH: 31.5 pg (ref 26.0–34.0)
MCHC: 33.4 g/dL (ref 32.0–36.0)
MCV: 94.1 fL (ref 80.0–100.0)
Monocytes Absolute: 0.3 10*3/uL (ref 0.2–0.9)
Monocytes Relative: 6 %
Neutro Abs: 3.8 10*3/uL (ref 1.4–6.5)
Neutrophils Relative %: 75 %
PLATELETS: 166 10*3/uL (ref 150–440)
RBC: 2.4 MIL/uL — AB (ref 3.80–5.20)
RDW: 17.1 % — AB (ref 11.5–14.5)
WBC: 5 10*3/uL (ref 3.6–11.0)

## 2016-01-25 LAB — IRON AND TIBC
Iron: 61 ug/dL (ref 28–170)
SATURATION RATIOS: 24 % (ref 10.4–31.8)
TIBC: 256 ug/dL (ref 250–450)
UIBC: 195 ug/dL

## 2016-01-25 LAB — PREPARE RBC (CROSSMATCH)

## 2016-01-25 LAB — FERRITIN: FERRITIN: 209 ng/mL (ref 11–307)

## 2016-01-25 NOTE — Telephone Encounter (Signed)
Pt came by cancer center to check labs. States has had black stools x6 days. hgb was found to be 7.5. Pt scheduled for blood transfusion on Friday at 9am. Pt is aware. Pt will return to clinic in 2 weeks to recheck labs and follow up. Appt to be made with Fransico Setters[place] at [facility] for GI evaluation.

## 2016-01-26 ENCOUNTER — Other Ambulatory Visit: Payer: Self-pay | Admitting: Oncology

## 2016-01-26 DIAGNOSIS — D649 Anemia, unspecified: Secondary | ICD-10-CM

## 2016-01-27 ENCOUNTER — Inpatient Hospital Stay: Payer: Medicare Other

## 2016-01-27 DIAGNOSIS — D649 Anemia, unspecified: Secondary | ICD-10-CM

## 2016-01-27 DIAGNOSIS — D509 Iron deficiency anemia, unspecified: Secondary | ICD-10-CM | POA: Diagnosis not present

## 2016-01-27 MED ORDER — DIPHENHYDRAMINE HCL 25 MG PO CAPS
25.0000 mg | ORAL_CAPSULE | Freq: Once | ORAL | Status: AC
Start: 2016-01-27 — End: 2016-01-27
  Administered 2016-01-27: 25 mg via ORAL
  Filled 2016-01-27: qty 1

## 2016-01-27 MED ORDER — DIPHENHYDRAMINE HCL 50 MG/ML IJ SOLN: 25.0000 mg | Freq: Once | INTRAMUSCULAR | Status: DC

## 2016-01-27 MED ORDER — SODIUM CHLORIDE 0.9 % IV SOLN
INTRAVENOUS | Status: DC
  Administered 2016-01-27: 10:00:00 via INTRAVENOUS
  Filled 2016-01-27: qty 1000

## 2016-01-27 MED ORDER — ACETAMINOPHEN 325 MG PO TABS
650.0000 mg | ORAL_TABLET | Freq: Once | ORAL | Status: AC
  Administered 2016-01-27: 650 mg via ORAL
  Filled 2016-01-27: qty 2

## 2016-01-28 LAB — TYPE AND SCREEN
ABO/RH(D): A POS
Antibody Screen: NEGATIVE
Unit division: 0

## 2016-02-07 NOTE — Progress Notes (Signed)
[facility]  Telephone:(336) [phone]  Fax:(336) [phone]     Kelsey Le DOB: 1930-10-04  MR#: [id number]  AOZ#:[id number]  Patient Care Team: Marguarite Arbour, MD as PCP - General (Internal Medicine)  Anemia of iron deficiency, patient intolerant of oral iron (cannot take even over-the-counter low-dose iron preparation due to severe GI upset), presented during hospitalization in January 2016 with hemoglobin of 5.5/hematocrit 18.4 and received blood transfusion support. Also started parenteral iron therapy on January 05, 2014, with Venofer 500 mg IV x1  INTERVAL HISTORY: Patient returns to clinic today for further evaluation and consideration of blood transfusion. She is scheduled to have a colonoscopy in the near future or persistent GI bleed, but will require improvement of her hemoglobin prior to undergoing this procedure. She continues to have right leg pain secondary to her recent femur fracture. She denies any other pain. He has no neurologic complaints. She has significant dyspnea on exertion, but denies cough or hemoptysis. She denies fever, chills, or recent illnesses.  She denies nausea, vomiting, constipation, or diarrhea.  Her appetite is improving.  Patient offers no further specific complaints today.   REVIEW OF SYSTEMS:   Review of Systems  Constitutional: Positive for malaise/fatigue. Negative for chills and fever.  HENT: Negative.   Eyes: Negative.   Respiratory: Positive for shortness of breath. Negative for cough and hemoptysis.   Cardiovascular: Positive for leg swelling. Negative for chest pain and palpitations.  Gastrointestinal: Positive for blood in stool and melena. Negative for constipation, diarrhea, nausea and vomiting.  Genitourinary: Negative.   Musculoskeletal: Positive for joint pain.  Skin: Negative.   Neurological: Positive for weakness. Negative for dizziness and headaches.  Endo/Heme/Allergies: Negative.   Psychiatric/Behavioral:  Negative.     As per HPI. Otherwise, a complete review of systems is negative.  ONCOLOGY HISTORY:  No history exists.    PAST MEDICAL HISTORY: Past Medical History:  Diagnosis Date  . Arrhythmia   . Asthma   . Broken back   . Broken wrist   . Broken wrist   . Fractured hip (HCC)   . Heart murmur   . Syncope and collapse     PAST SURGICAL HISTORY: Past Surgical History:  Procedure Laterality Date  . APPENDECTOMY    . BACK SURGERY     back fusion  . ESOPHAGOGASTRODUODENOSCOPY (EGD) WITH PROPOFOL N/A 11/15/2015   Procedure: ESOPHAGOGASTRODUODENOSCOPY (EGD) WITH PROPOFOL;  Surgeon: [name], MD;  Location: [facility] ENDOSCOPY;  Service: Endoscopy;  Laterality: N/A;  . HIP FRACTURE SURGERY    . INTRAMEDULLARY (IM) NAIL INTERTROCHANTERIC Right 11/18/2015   Procedure: INTRAMEDULLARY (IM) NAIL INTERTROCHANTRIC;  Surgeon: Juanell Fairly, MD;  Location: [facility] ORS;  Service: Orthopedics;  Laterality: Right;  . KYPHOPLASTY N/A 08/19/2014   Procedure: KYPHOPLASTY;  Surgeon: Kennedy Bucker, MD;  Location: [facility] ORS;  Service: Orthopedics;  Laterality: N/A;  . PARTIAL HYSTERECTOMY    . TUBAL LIGATION      FAMILY HISTORY Family History  Problem Relation Age of Onset  . Heart disease Sister   . Breast cancer Maternal Aunt     GYNECOLOGIC HISTORY:  No LMP recorded. Patient is postmenopausal.     ADVANCED DIRECTIVES:    HEALTH MAINTENANCE: Social History  Substance Use Topics  . Smoking status: Never Smoker  . Smokeless tobacco: Never Used  . Alcohol use No    Allergies  Allergen Reactions  . Penicillins     antibiotics    Current Outpatient Prescriptions  Medication Sig Dispense Refill  .  acetaminophen (TYLENOL) 500 MG tablet Take 500 mg by mouth as needed.     . vitamin B-12 (CYANOCOBALAMIN) 1000 MCG tablet Take 1,000 mcg by mouth daily.     No current facility-administered medications for this visit.     OBJECTIVE: BP 104/61 (BP Location: Left Arm, Patient  Position: Sitting)   Pulse 91   Temp 97.7 F (36.5 C) (Oral)   Resp 18   Wt 116 lb 15.3 oz (53 kg)   SpO2 100%   BMI 19.46 kg/m    Body mass index is 19.46 kg/m.    ECOG FS:0 - Asymptomatic  General: Well-developed, well-nourished, no acute distress. Eyes: Pink conjunctiva, anicteric sclera. HEENT: Normocephalic, moist mucous membranes, clear oropharnyx. Lungs: Clear to auscultation bilaterally. Heart: Regular rate and rhythm. Positive for murmur Musculoskeletal: Non-pitting edema in bilateral ankles. No cyanosis or clubbing. Neuro: Alert, answering all questions appropriately.  Skin: No rashes or petechiae noted. Psych: Normal affect.   LAB RESULTS:  Office Visit on 02/08/2016  Component Date Value Ref Range Status  . Order Confirmation 02/08/2016 ORDER PROCESSED BY BLOOD BANK   Final  . ISSUE DATE / TIME 02/08/2016 [id number]   Final  . Blood Product Unit Number 02/08/2016 [id number]   Final  . PRODUCT CODE 02/08/2016 [id number]   Final  . Unit Type and Rh 02/08/2016 6200   Final  . Blood Product Expiration Date 02/08/2016 [id number]   Final  . ISSUE DATE / TIME 02/08/2016 [id number]   Final  . Blood Product Unit Number 02/08/2016 [id number]   Final  . PRODUCT CODE 02/08/2016 [id number]   Final  . Unit Type and Rh 02/08/2016 6200   Final  . Blood Product Expiration Date 02/08/2016 [id number]   Final  . ISSUE DATE / TIME 02/08/2016 [id number]   Final  . Blood Product Unit Number 02/08/2016 [id number]   Final  . PRODUCT CODE 02/08/2016 [id number]   Final  . Unit Type and Rh 02/08/2016 0600   Final  . Blood Product Expiration Date 02/08/2016 [id number]   Final  Appointment on 02/08/2016  Component Date Value Ref Range Status  . Blood Bank Specimen 02/08/2016 SAMPLE AVAILABLE FOR TESTING   Final  . Sample Expiration 02/08/2016 02/11/2016   Final  . WBC 02/08/2016 4.9  3.6 - 11.0 K/uL Final  . RBC 02/08/2016 1.46* 3.80 - 5.20 MIL/uL Final  . Hemoglobin  02/08/2016 4.8* 12.0 - 16.0 g/dL Final   Comment: RESULT REPEATED AND VERIFIED CRITICAL RESULT CALLED TO, READ BACK BY AND VERIFIED WITH: HAYLEY RHODE @0853  02/08/2016 LGR   . HCT 02/08/2016 14.6* 35.0 - 47.0 % Final   Comment: RESULT REPEATED AND VERIFIED CRITICAL RESULT CALLED TO, READ BACK BY AND VERIFIED WITH: HAYLEY RHODE @0853  02/08/2016 LGR   . MCV 02/08/2016 99.7  80.0 - 100.0 fL Final  . MCH 02/08/2016 32.5  26.0 - 34.0 pg Final  . MCHC 02/08/2016 32.6  32.0 - 36.0 g/dL Final  . RDW 54/62/703502/07/2016 19.5* 11.5 - 14.5 % Final  . Platelets 02/08/2016 239  150 - 440 K/uL Final  . Neutrophils Relative % 02/08/2016 81  % Final  . Neutro Abs 02/08/2016 4.0  1.4 - 6.5 K/uL Final  . Lymphocytes Relative 02/08/2016 13  % Final  . Lymphs Abs 02/08/2016 0.6* 1.0 - 3.6 K/uL Final  . Monocytes Relative 02/08/2016 4  % Final  . Monocytes Absolute 02/08/2016 0.2  0.2 - 0.9 K/uL Final  . Eosinophils Relative 02/08/2016 0  % Final  .  Eosinophils Absolute 02/08/2016 0.0  0 - 0.7 K/uL Final  . Basophils Relative 02/08/2016 2  % Final  . Basophils Absolute 02/08/2016 0.1  0 - 0.1 K/uL Final    STUDIES: No results found.  ASSESSMENT: Iron deficiency anemia.  PLAN:    1. Iron deficiency anemia: History of intermittent dark stools, recently reported negative EGD, colonoscopy, capsule endoscopy completed in [place] in 2015. Patient has repeat colonoscopy scheduled in the near future. Her hemoglobin is significantly decreased today, therefore will proceed with 2 units of packed red blood cells. Patient will also return to clinic tomorrow for consideration of one or 2 additional units. Return to clinic in 1 week with repeat laboratory work and further evaluation.  2. Fractured femur: Continue physical therapy and rehabilitation per orthopedics. 3. GI bleed: Colonoscopy and possible EGD per GI.  Patient expressed understanding and was in agreement with this plan. She also understands that She can call  clinic at any time with any questions, concerns, or complaints.    Jeralyn Ruths, MD 02/12/16 8:36 AM

## 2016-02-08 ENCOUNTER — Inpatient Hospital Stay: Payer: Medicare Other | Attending: Oncology | Admitting: Oncology

## 2016-02-08 ENCOUNTER — Inpatient Hospital Stay: Payer: Medicare Other

## 2016-02-08 ENCOUNTER — Ambulatory Visit
Admission: RE | Admit: 2016-02-08 | Discharge: 2016-02-08 | Disposition: A | Discharge status: Home / Self Care | Payer: Medicare Other | Source: Ambulatory Visit | Attending: Oncology | Admitting: Oncology

## 2016-02-08 VITALS — BP 104/61 | HR 91 | Temp 97.7°F | Resp 18 | Wt 117.0 lb

## 2016-02-08 DIAGNOSIS — Z79899 Other long term (current) drug therapy: Secondary | ICD-10-CM | POA: Diagnosis not present

## 2016-02-08 DIAGNOSIS — K922 Gastrointestinal hemorrhage, unspecified: Secondary | ICD-10-CM | POA: Insufficient documentation

## 2016-02-08 DIAGNOSIS — R5383 Other fatigue: Secondary | ICD-10-CM | POA: Diagnosis not present

## 2016-02-08 DIAGNOSIS — Z9049 Acquired absence of other specified parts of digestive tract: Secondary | ICD-10-CM | POA: Insufficient documentation

## 2016-02-08 DIAGNOSIS — D500 Iron deficiency anemia secondary to blood loss (chronic): Secondary | ICD-10-CM | POA: Insufficient documentation

## 2016-02-08 DIAGNOSIS — D508 Other iron deficiency anemias: Secondary | ICD-10-CM

## 2016-02-08 DIAGNOSIS — K921 Melena: Secondary | ICD-10-CM | POA: Diagnosis not present

## 2016-02-08 DIAGNOSIS — Z8781 Personal history of (healed) traumatic fracture: Secondary | ICD-10-CM | POA: Insufficient documentation

## 2016-02-08 DIAGNOSIS — D509 Iron deficiency anemia, unspecified: Secondary | ICD-10-CM

## 2016-02-08 DIAGNOSIS — R011 Cardiac murmur, unspecified: Secondary | ICD-10-CM | POA: Insufficient documentation

## 2016-02-08 DIAGNOSIS — J45909 Unspecified asthma, uncomplicated: Secondary | ICD-10-CM | POA: Insufficient documentation

## 2016-02-08 DIAGNOSIS — R0609 Other forms of dyspnea: Secondary | ICD-10-CM | POA: Insufficient documentation

## 2016-02-08 DIAGNOSIS — M898X5 Other specified disorders of bone, thigh: Secondary | ICD-10-CM | POA: Insufficient documentation

## 2016-02-08 DIAGNOSIS — M2550 Pain in unspecified joint: Secondary | ICD-10-CM | POA: Insufficient documentation

## 2016-02-08 DIAGNOSIS — R531 Weakness: Secondary | ICD-10-CM | POA: Insufficient documentation

## 2016-02-08 DIAGNOSIS — M7989 Other specified soft tissue disorders: Secondary | ICD-10-CM | POA: Insufficient documentation

## 2016-02-08 DIAGNOSIS — D649 Anemia, unspecified: Secondary | ICD-10-CM

## 2016-02-08 DIAGNOSIS — Z803 Family history of malignant neoplasm of breast: Secondary | ICD-10-CM | POA: Diagnosis not present

## 2016-02-08 LAB — CBC WITH DIFFERENTIAL/PLATELET
Basophils Absolute: 0.1 10*3/uL (ref 0–0.1)
Basophils Relative: 2 %
EOS PCT: 0 %
Eosinophils Absolute: 0 10*3/uL (ref 0–0.7)
HCT: 14.6 % — CL (ref 35.0–47.0)
Hemoglobin: 4.8 g/dL — CL (ref 12.0–16.0)
LYMPHS ABS: 0.6 10*3/uL — AB (ref 1.0–3.6)
LYMPHS PCT: 13 %
MCH: 32.5 pg (ref 26.0–34.0)
MCHC: 32.6 g/dL (ref 32.0–36.0)
MCV: 99.7 fL (ref 80.0–100.0)
MONO ABS: 0.2 10*3/uL (ref 0.2–0.9)
MONOS PCT: 4 %
Neutro Abs: 4 10*3/uL (ref 1.4–6.5)
Neutrophils Relative %: 81 %
PLATELETS: 239 10*3/uL (ref 150–440)
RBC: 1.46 MIL/uL — ABNORMAL LOW (ref 3.80–5.20)
RDW: 19.5 % — AB (ref 11.5–14.5)
WBC: 4.9 10*3/uL (ref 3.6–11.0)

## 2016-02-08 LAB — PREPARE RBC (CROSSMATCH)

## 2016-02-08 LAB — SAMPLE TO BLOOD BANK

## 2016-02-08 MED ORDER — DIPHENHYDRAMINE HCL 50 MG/ML IJ SOLN
25.0000 mg | Freq: Once | INTRAMUSCULAR | Status: AC
  Administered 2016-02-08: 25 mg via INTRAVENOUS
  Filled 2016-02-08: qty 1

## 2016-02-08 MED ORDER — ACETAMINOPHEN 325 MG PO TABS
650.0000 mg | ORAL_TABLET | Freq: Once | ORAL | Status: AC
  Administered 2016-02-08: 650 mg via ORAL
  Filled 2016-02-08: qty 2

## 2016-02-08 MED ORDER — SODIUM CHLORIDE 0.9 % IV SOLN
250.0000 mL | Freq: Once | INTRAVENOUS | Status: AC
  Filled 2016-02-08: qty 250

## 2016-02-08 MED ORDER — SODIUM CHLORIDE 0.9 % IV SOLN
250.0000 mL | Freq: Once | INTRAVENOUS | Status: AC
  Administered 2016-02-08: 250 mL via INTRAVENOUS
  Filled 2016-02-08: qty 250

## 2016-02-08 NOTE — OR Nursing (Signed)
Son of the patient brought patient here to [facility] as previous phone call was finishing. Rolly SalterHaley returned the call telling me that the patient could return to the cancer center to receive her blood transfusions. I spoke with the patient and told her that Dr. Orlie DakinFinnegan would prefer her to be where he can monitor her and she understood.  Patient was returning to cancer center via wheelchair.

## 2016-02-08 NOTE — OR Nursing (Signed)
Spoke with Rolly SalterHaley from the cancer center regarding the "policy" about not being able to transfuse a patient if their hemoglobin is below 5.0.Marland Kitchen. There is a patient in the cc today that needs to come to [facility] to receive her 2 units of RBC.  I asked for a copy of this policy as I was unaware of the criteria surrounding this policy.  The bloodbank was also unsure of the specifics.  Rolly SalterHaley told me that the patient does not need a hospitalist to assess her medically as Dr. Orlie DakinFinnegan saw her this morning and she if stable.  Shelly from Blood bank called back to say that there is a policy in place for the Cancer Center that states a patient with a hgb below 5 is considered critical and should be either admitted or assessed but should go to an outpatient service area to receive their treatment.  Shelly spoke with Dr. Oneita Krasubinas and discussed that it was up to the oncologist to determine if the patient is stable enough to be transfused in the cancer center, then he may determine to do it there. Per Burnett HarryShelly, Dr. Orlie DakinFinnegan wants to transfuse this patient in the cancer center.  I called Rolly SalterHaley again to give her this information and to see if she had found the written policy.  She stated that the infusion area could not find the written policy.  Rolly SalterHaley would let Dr. Orlie DakinFinnegan know about this policy and that location of transfusion was his choice.  I am now awaiting a return call from [place] to clarify this situation.

## 2016-02-09 ENCOUNTER — Inpatient Hospital Stay: Payer: Medicare Other

## 2016-02-09 VITALS — BP 129/76 | HR 76 | Temp 98.7°F | Resp 18

## 2016-02-09 DIAGNOSIS — D500 Iron deficiency anemia secondary to blood loss (chronic): Secondary | ICD-10-CM | POA: Diagnosis not present

## 2016-02-09 DIAGNOSIS — D649 Anemia, unspecified: Secondary | ICD-10-CM

## 2016-02-09 LAB — CBC WITH DIFFERENTIAL/PLATELET
Basophils Absolute: 0.1 10*3/uL (ref 0–0.1)
Basophils Relative: 1 %
EOS ABS: 0.1 10*3/uL (ref 0–0.7)
EOS PCT: 2 %
HCT: 24.3 % — ABNORMAL LOW (ref 35.0–47.0)
Hemoglobin: 8.2 g/dL — ABNORMAL LOW (ref 12.0–16.0)
LYMPHS ABS: 0.9 10*3/uL — AB (ref 1.0–3.6)
Lymphocytes Relative: 15 %
MCH: 31.2 pg (ref 26.0–34.0)
MCHC: 33.9 g/dL (ref 32.0–36.0)
MCV: 92.2 fL (ref 80.0–100.0)
MONO ABS: 0.3 10*3/uL (ref 0.2–0.9)
MONOS PCT: 5 %
Neutro Abs: 4.3 10*3/uL (ref 1.4–6.5)
Neutrophils Relative %: 77 %
PLATELETS: 227 10*3/uL (ref 150–440)
RBC: 2.64 MIL/uL — ABNORMAL LOW (ref 3.80–5.20)
RDW: 19.8 % — ABNORMAL HIGH (ref 11.5–14.5)
WBC: 5.7 10*3/uL (ref 3.6–11.0)

## 2016-02-09 LAB — PREPARE RBC (CROSSMATCH)

## 2016-02-09 MED ORDER — DIPHENHYDRAMINE HCL 50 MG/ML IJ SOLN
25.0000 mg | Freq: Once | INTRAMUSCULAR | Status: AC
Start: 2016-02-09 — End: 2016-02-09
  Administered 2016-02-09: 25 mg via INTRAVENOUS
  Filled 2016-02-09: qty 1

## 2016-02-09 MED ORDER — ACETAMINOPHEN 325 MG PO TABS
650.0000 mg | ORAL_TABLET | Freq: Once | ORAL | Status: AC
Start: 2016-02-09 — End: 2016-02-09
  Administered 2016-02-09: 650 mg via ORAL
  Filled 2016-02-09: qty 2

## 2016-02-10 ENCOUNTER — Telehealth: Payer: Self-pay | Admitting: Cardiovascular Disease

## 2016-02-10 ENCOUNTER — Encounter: Payer: Self-pay | Admitting: Cardiovascular Disease

## 2016-02-10 ENCOUNTER — Ambulatory Visit (INDEPENDENT_AMBULATORY_CARE_PROVIDER_SITE_OTHER): Payer: Medicare Other | Admitting: Cardiovascular Disease

## 2016-02-10 VITALS — BP 140/60 | HR 73 | Ht 65.5 in | Wt 118.8 lb

## 2016-02-10 DIAGNOSIS — I359 Nonrheumatic aortic valve disorder, unspecified: Secondary | ICD-10-CM

## 2016-02-10 DIAGNOSIS — Z01810 Encounter for preprocedural cardiovascular examination: Secondary | ICD-10-CM | POA: Diagnosis not present

## 2016-02-10 LAB — TYPE AND SCREEN
BLOOD PRODUCT EXPIRATION DATE: 201802142359
Blood Product Expiration Date: 201802142359
Blood Product Expiration Date: 201802192359
ISSUE DATE / TIME: 201802071327
ISSUE DATE / TIME: 201802081031
ISSUE DATE / TIME: 201802071123
UNIT TYPE AND RH: 6200
Unit Type and Rh: 600
Unit Type and Rh: 6200

## 2016-02-10 NOTE — Patient Instructions (Signed)
Medication Instructions:  Your physician recommends that you continue on your current medications as directed. Please refer to the Current Medication list given to you today.   Labwork: none  Testing/Procedures: none  Follow-Up: Your physician wants you to follow-up in: 6 months with Dr. Kirke CorinArida.  You will receive a reminder letter in the mail two months in advance. If you don't receive a letter, please call our office to schedule the follow-up appointment.   Any Other Special Instructions Will Be Listed Below (If Applicable). You are at moderate risk for your upcoming colonoscopy and EGD.  [facility]     If you need a refill on your cardiac medications before your next appointment, please call your pharmacy.

## 2016-02-10 NOTE — Telephone Encounter (Signed)
Clearance for colonoscopy and EGD [facility], [phone]

## 2016-02-10 NOTE — Progress Notes (Signed)
Cardiology Office Note   Date:  02/10/2016   ID:  Kelsey Le, DOB [birth year], MRN [id number]  PCP:  Marguarite ArbourSPARKS,JEFFREY D, MD  Cardiologist:   Lorine BearsMuhammad Erendira Crabtree, MD   Chief Complaint  Patient presents with  . other    6 month follow up. Meds reviewed by the pt. verbally. Pt needs a cardiac clearance for a colonoscopy per Dr. Mechele CollinElliott, the patient has       History of Present Illness: Kelsey Le is a 83 y.o. female who presents for  a follow-up visit regarding asymptomatic aortic stenosis. A nuclear stress test in May 2015 showed no evidence of ischemia. Most recent echo in August 2017 showed normal LV systolic function, moderate to severe aortic valve stenosis with a mean gradient of 28 mmHg and valve area of 0.97, moderate pulmonary hypertension and small pericardial effusion. The patient has known history of recurrent GI bleeding and iron deficiency anemia. She had a mechanical fall in November and fractured right hip. She had surgery done. She has been having multiple problems with iron deficiency anemia with a recent hemoglobin of 4.9. She underwent transfusion with subsequent improvement. She was having dark tarry stool but that has improved to brown color this morning. She needs to have EGD and colonoscopy done.   she denies any chest pain or palpitations. She feels weak and short of breath which has improved after transfusion.  She has chronic back pain and has underwent kyphoplasty in the past.  She also has known history of anxiety with mild memory problems.  Past Medical History:  Diagnosis Date  . Arrhythmia   . Asthma   . Broken back   . Broken wrist   . Broken wrist   . Fractured hip (HCC)   . Heart murmur   . Syncope and collapse     Past Surgical History:  Procedure Laterality Date  . APPENDECTOMY    . BACK SURGERY     back fusion  . ESOPHAGOGASTRODUODENOSCOPY (EGD) WITH PROPOFOL N/A 11/15/2015   Procedure: ESOPHAGOGASTRODUODENOSCOPY (EGD) WITH PROPOFOL;   Surgeon: Midge Miniumarren Wohl, MD;  Location: [facility];  Service: Endoscopy;  Laterality: N/A;  . HIP FRACTURE SURGERY    . INTRAMEDULLARY (IM) NAIL INTERTROCHANTERIC Right 11/18/2015   Procedure: INTRAMEDULLARY (IM) NAIL INTERTROCHANTRIC;  Surgeon: Juanell FairlyKevin Krasinski, MD;  Location: [facility] OR[facility];  Service: Orthopedics;  Laterality: Right;  . KYPHOPLASTY N/A 08/19/2014   Procedure: KYPHOPLASTY;  Surgeon: Kennedy BuckerMichael Menz, MD;  Location: [facility] ORS;  Service: Orthopedics;  Laterality: N/A;  . PARTIAL HYSTERECTOMY    . TUBAL LIGATION       Current Outpatient Prescriptions  Medication Sig Dispense Refill  . acetaminophen (TYLENOL) 500 MG tablet Take 500 mg by mouth as needed.     . Cholecalciferol (VITAMIN D-1000 MAX ST) 1000 units tablet Take by mouth.    . docusate sodium (COLACE) 100 MG capsule Take 1 capsule (100 mg total) by mouth 2 (two) times daily. 10 capsule 0  . ferrous sulfate 325 (65 FE) MG tablet Take 1 tablet (325 mg total) by mouth 3 (three) times daily after meals.  3  . HYDROcodone-acetaminophen (NORCO/VICODIN) 5-325 MG tablet Take 1-2 tablets by mouth every 6 (six) hours as needed for moderate pain. 30 tablet 0  . pantoprazole (PROTONIX) 40 MG tablet Take 1 tablet (40 mg total) by mouth 2 (two) times daily before a meal.    . senna (SENOKOT) 8.6 MG TABS tablet Take 1 tablet (8.6 mg total) by mouth  2 (two) times daily. 120 each 0  . vitamin B-12 (CYANOCOBALAMIN) 1000 MCG tablet Take 1,000 mcg by mouth daily.     No current facility-administered medications for this visit.     Allergies:   Penicillins    Social History:  The patient  reports that she has never smoked. She has never used smokeless tobacco. She reports that she does not drink alcohol or use drugs.   Family History:  The patient's family history includes Breast cancer in her maternal aunt; Heart disease in her sister.    ROS:  Please see the history of present illness.   Otherwise, review of systems are positive for none.    All other systems are reviewed and negative.    PHYSICAL EXAM: VS:  Ht 5' 5.5" (1.664 m)   Wt 118 lb 12 oz (53.9 kg)   BMI 19.46 kg/m  , BMI Body mass index is 19.46 kg/m. GEN: Well nourished, well developed, in no acute distress  HEENT: normal  Neck: no JVD, or masses. Bilateral carotid bruits versus a radiated aortic murmur Cardiac: RRR;  rubs, or gallops,no edema . There is a 3/6 systolic murmur in the aortic area which is mid to late peaking with mildly diminished S2. The murmur radiates to the carotid arteries. Respiratory:  clear to auscultation bilaterally, normal work of breathing GI: soft, nontender, nondistended, + BS MS: no deformity or atrophy  Skin: warm and dry, no rash Neuro:  Strength and sensation are intact Psych: euthymic mood, full affect   EKG:  EKG is ordered today. The ekg ordered today demonstrates  normal sinus rhythm . Left ventricular hypertrophy with repolarization abnormalities   Recent Labs: 11/17/2015: ALT 16 12/01/2015: BUN 30; Creatinine, Ser 0.93; Potassium 4.4; Sodium 142 02/09/2016: Hemoglobin 8.2; Platelets 227    Lipid Panel No results found for: CHOL, TRIG, HDL, CHOLHDL, VLDL, LDLCALC, LDLDIRECT    Wt Readings from Last 3 Encounters:  02/10/16 118 lb 12 oz (53.9 kg)  02/08/16 116 lb 15.3 oz (53 kg)  01/16/16 113 lb 15.7 oz (51.7 kg)        ASSESSMENT AND PLAN:   1.  Moderate to severe aortic stenosis:    She might not be a candidate for any aortic valve procedure given age, comorbidities and poor functional capacity.  2. Severe blood loss anemia: Recent suspected upper GI bleed that required transfusion. The patient is going to have EGD and colonoscopy.  3. Preoperative cardiovascular evaluation for endoscopic procedures: Given the patient's age and moderate to severe aortic stenosis, she is at moderate risk from a cardiovascular standpoint. Avoid hypotension.     Disposition:   FU with me in 6 months  Signed,  Lorine Bears, MD  02/10/2016 11:31 AM    [facility]

## 2016-02-14 ENCOUNTER — Other Ambulatory Visit: Payer: Self-pay

## 2016-02-14 DIAGNOSIS — D500 Iron deficiency anemia secondary to blood loss (chronic): Secondary | ICD-10-CM

## 2016-02-14 NOTE — Progress Notes (Signed)
[facility]  [phone]  Fax:([phone]     Kelsey Le DOB: August 05, 1930  MR#: [id number]  AOZ#:[id number]  Patient Care Team: Marguarite Arbour, MD as PCP - General (Internal Medicine)  Chief complaint: Iron deficiency anemia secondary to GI blood loss.  INTERVAL HISTORY: Patient returns to clinic today for further evaluation and consideration of blood transfusion. She feels significantly improved after receiving multiple units of packed red blood cells last week. She continues to have right leg pain secondary to her recent femur fracture. She denies any other pain. He has no neurologic complaints. She used to have weakness and fatigue, but no longer complains of dyspnea on exertion. She denies any recent fevers or illnesses. She has no chest pain or cough.  She denies nausea, vomiting, constipation, or diarrhea. She denies any recent melena or hematochezia. Her appetite is improving.  Patient offers no further specific complaints today.   REVIEW OF SYSTEMS:   Review of Systems  Constitutional: Positive for malaise/fatigue. Negative for chills and fever.  HENT: Negative.   Eyes: Negative.   Respiratory: Negative for cough, hemoptysis and shortness of breath.   Cardiovascular: Positive for leg swelling. Negative for chest pain and palpitations.  Gastrointestinal: Negative for blood in stool, constipation, diarrhea, melena, nausea and vomiting.  Genitourinary: Negative.   Musculoskeletal: Positive for joint pain.  Skin: Negative.   Neurological: Positive for weakness. Negative for dizziness and headaches.  Endo/Heme/Allergies: Negative.   Psychiatric/Behavioral: Negative.  The patient is not nervous/anxious.     As per HPI. Otherwise, a complete review of systems is negative.  ONCOLOGY HISTORY:  No history exists.    PAST MEDICAL HISTORY: Past Medical History:  Diagnosis Date  . Arrhythmia   . Asthma   . Broken back   . Broken wrist   .  Broken wrist   . Fractured hip (HCC)   . Heart murmur   . Syncope and collapse     PAST SURGICAL HISTORY: Past Surgical History:  Procedure Laterality Date  . ABDOMINAL HYSTERECTOMY    . APPENDECTOMY    . BACK SURGERY     back fusion  . ESOPHAGOGASTRODUODENOSCOPY (EGD) WITH PROPOFOL N/A 11/15/2015   Procedure: ESOPHAGOGASTRODUODENOSCOPY (EGD) WITH PROPOFOL;  Surgeon: [name], MD;  Location: [facility];  Service: Endoscopy;  Laterality: N/A;  . FRACTURE SURGERY    . HIP FRACTURE SURGERY    . INTRAMEDULLARY (IM) NAIL INTERTROCHANTERIC Right 11/18/2015   Procedure: INTRAMEDULLARY (IM) NAIL INTERTROCHANTRIC;  Surgeon: Juanell Fairly, MD;  Location: [facility] OR[facility];  Service: Orthopedics;  Laterality: Right;  . KYPHOPLASTY N/A 08/19/2014   Procedure: KYPHOPLASTY;  Surgeon: Kennedy Bucker, MD;  Location: [facility] ORS;  Service: Orthopedics;  Laterality: N/A;  . PARTIAL HYSTERECTOMY    . TUBAL LIGATION      FAMILY HISTORY Family History  Problem Relation Age of Onset  . Heart disease Sister   . Breast cancer Maternal Aunt     GYNECOLOGIC HISTORY:  No LMP recorded. Patient is postmenopausal.     ADVANCED DIRECTIVES:    HEALTH MAINTENANCE: Social History  Substance Use Topics  . Smoking status: Never Smoker  . Smokeless tobacco: Never Used  . Alcohol use No    Allergies  Allergen Reactions  . Penicillins     antibiotics    No current facility-administered medications for this visit.    No current outpatient prescriptions on file.   Facility-Administered Medications Ordered in Other Visits  Medication Dose Route Frequency Provider Last Rate  Last Dose  . 0.9 %  sodium chloride infusion   Intravenous Continuous Scot Junobert T Elliott, MD        OBJECTIVE: BP (!) 151/54 (BP Location: Left Arm, Patient Position: Sitting)   Pulse 65   Temp 99.2 F (37.3 C) (Tympanic)   Wt 115 lb 8.3 oz (52.4 kg)   BMI 18.93 kg/m    Body mass index is 18.93 kg/m.    ECOG FS:0 -  Asymptomatic  General: Well-developed, well-nourished, no acute distress. Eyes: Pink conjunctiva, anicteric sclera. HEENT: Normocephalic, moist mucous membranes, clear oropharnyx. Lungs: Clear to auscultation bilaterally. Heart: Regular rate and rhythm. Positive for murmur Musculoskeletal: Non-pitting edema in bilateral ankles. No cyanosis or clubbing. Neuro: Alert, answering all questions appropriately.  Skin: No rashes or petechiae noted. Psych: Normal affect.   LAB RESULTS:  Appointment on 02/15/2016  Component Date Value Ref Range Status  . WBC 02/15/2016 4.9  3.6 - 11.0 K/uL Final  . RBC 02/15/2016 3.17* 3.80 - 5.20 MIL/uL Final  . Hemoglobin 02/15/2016 10.2* 12.0 - 16.0 g/dL Final  . HCT 54/09/811902/14/2018 30.4* 35.0 - 47.0 % Final  . MCV 02/15/2016 95.9  80.0 - 100.0 fL Final  . MCH 02/15/2016 32.0  26.0 - 34.0 pg Final  . MCHC 02/15/2016 33.4  32.0 - 36.0 g/dL Final  . RDW 14/78/295602/14/2018 17.7* 11.5 - 14.5 % Final  . Platelets 02/15/2016 228  150 - 440 K/uL Final  . Neutrophils Relative % 02/15/2016 70  % Final  . Neutro Abs 02/15/2016 3.5  1.4 - 6.5 K/uL Final  . Lymphocytes Relative 02/15/2016 19  % Final  . Lymphs Abs 02/15/2016 0.9* 1.0 - 3.6 K/uL Final  . Monocytes Relative 02/15/2016 8  % Final  . Monocytes Absolute 02/15/2016 0.4  0.2 - 0.9 K/uL Final  . Eosinophils Relative 02/15/2016 2  % Final  . Eosinophils Absolute 02/15/2016 0.1  0 - 0.7 K/uL Final  . Basophils Relative 02/15/2016 1  % Final  . Basophils Absolute 02/15/2016 0.1  0 - 0.1 K/uL Final  . Blood Bank Specimen 02/15/2016 SAMPLE AVAILABLE FOR TESTING   Final  . Sample Expiration 02/15/2016 02/18/2016   Final    STUDIES: No results found.  ASSESSMENT: Iron deficiency anemia.  PLAN:    1. Iron deficiency anemia: History of intermittent dark stools, recently reported negative EGD, colonoscopy, [place] end[place] in [place] in [place]. Patient has repeat EGD this Friday and then possible  colonoscopy next week. She does not require additional transfusion today. Return to clinic in 2 weeks with repeat laboratory work and further evaluation.  2. Fractured femur: Continue physical therapy and rehabilitation per orthopedics. 3. GI bleed: Colonoscopy and EGD as above.  Patient expressed understanding and was in agreement with this plan. She also understands that She can call clinic at any time with any questions, concerns, or complaints.    Jeralyn Ruthsimothy J Samie Reasons, MD 02/17/16 1:13 PM

## 2016-02-15 ENCOUNTER — Inpatient Hospital Stay: Payer: Medicare Other

## 2016-02-15 ENCOUNTER — Other Ambulatory Visit: Payer: Self-pay | Admitting: *Deleted

## 2016-02-15 ENCOUNTER — Inpatient Hospital Stay (HOSPITAL_BASED_OUTPATIENT_CLINIC_OR_DEPARTMENT_OTHER): Payer: Medicare Other | Admitting: Oncology

## 2016-02-15 VITALS — BP 151/54 | HR 65 | Temp 99.2°F | Wt 115.5 lb

## 2016-02-15 DIAGNOSIS — D500 Iron deficiency anemia secondary to blood loss (chronic): Secondary | ICD-10-CM

## 2016-02-15 DIAGNOSIS — K922 Gastrointestinal hemorrhage, unspecified: Secondary | ICD-10-CM | POA: Diagnosis not present

## 2016-02-15 LAB — CBC WITH DIFFERENTIAL/PLATELET
Basophils Absolute: 0.1 10*3/uL (ref 0–0.1)
Basophils Relative: 1 %
EOS ABS: 0.1 10*3/uL (ref 0–0.7)
Eosinophils Relative: 2 %
HEMATOCRIT: 30.4 % — AB (ref 35.0–47.0)
HEMOGLOBIN: 10.2 g/dL — AB (ref 12.0–16.0)
LYMPHS ABS: 0.9 10*3/uL — AB (ref 1.0–3.6)
LYMPHS PCT: 19 %
MCH: 32 pg (ref 26.0–34.0)
MCHC: 33.4 g/dL (ref 32.0–36.0)
MCV: 95.9 fL (ref 80.0–100.0)
MONOS PCT: 8 %
Monocytes Absolute: 0.4 10*3/uL (ref 0.2–0.9)
NEUTROS PCT: 70 %
Neutro Abs: 3.5 10*3/uL (ref 1.4–6.5)
Platelets: 228 10*3/uL (ref 150–440)
RBC: 3.17 MIL/uL — ABNORMAL LOW (ref 3.80–5.20)
RDW: 17.7 % — ABNORMAL HIGH (ref 11.5–14.5)
WBC: 4.9 10*3/uL (ref 3.6–11.0)

## 2016-02-15 LAB — SAMPLE TO BLOOD BANK

## 2016-02-15 NOTE — Progress Notes (Signed)
Patient denies pain or discomfort at this time.  Patient brought to exam room 10 via wheelchair, accompanied by her daughter.  BP 151/64 and documented

## 2016-02-17 ENCOUNTER — Encounter: Payer: Self-pay | Admitting: *Deleted

## 2016-02-17 ENCOUNTER — Encounter
Admission: RE | Disposition: A | Discharge status: Home / Self Care | Payer: Self-pay | Source: Ambulatory Visit | Attending: Unknown Physician Specialty

## 2016-02-17 ENCOUNTER — Ambulatory Visit: Payer: Medicare Other | Admitting: Anesthesiology

## 2016-02-17 ENCOUNTER — Ambulatory Visit
Admission: RE | Admit: 2016-02-17 | Discharge: 2016-02-17 | Disposition: A | Discharge status: Home / Self Care | Payer: Medicare Other | Source: Ambulatory Visit | Attending: Unknown Physician Specialty | Admitting: Unknown Physician Specialty

## 2016-02-17 DIAGNOSIS — Z539 Procedure and treatment not carried out, unspecified reason: Secondary | ICD-10-CM | POA: Diagnosis not present

## 2016-02-17 LAB — CBC WITH DIFFERENTIAL/PLATELET
BASOS ABS: 0.2 10*3/uL — AB (ref 0–0.1)
BASOS PCT: 4 %
EOS PCT: 2 %
Eosinophils Absolute: 0.1 10*3/uL (ref 0–0.7)
HCT: 32 % — ABNORMAL LOW (ref 35.0–47.0)
Hemoglobin: 10.5 g/dL — ABNORMAL LOW (ref 12.0–16.0)
Lymphocytes Relative: 20 %
Lymphs Abs: 0.8 10*3/uL — ABNORMAL LOW (ref 1.0–3.6)
MCH: 31.7 pg (ref 26.0–34.0)
MCHC: 32.8 g/dL (ref 32.0–36.0)
MCV: 96.7 fL (ref 80.0–100.0)
MONO ABS: 0.3 10*3/uL (ref 0.2–0.9)
MONOS PCT: 7 %
Neutro Abs: 2.6 10*3/uL (ref 1.4–6.5)
Neutrophils Relative %: 67 %
PLATELETS: 191 10*3/uL (ref 150–440)
RBC: 3.31 MIL/uL — ABNORMAL LOW (ref 3.80–5.20)
RDW: 17.7 % — AB (ref 11.5–14.5)
WBC: 4 10*3/uL (ref 3.6–11.0)

## 2016-02-17 SURGERY — ESOPHAGOGASTRODUODENOSCOPY (EGD) WITH PROPOFOL
Anesthesia: General

## 2016-02-17 MED ORDER — SODIUM CHLORIDE 0.9 % IV SOLN: INTRAVENOUS | Status: DC

## 2016-02-17 MED ORDER — PROPOFOL 500 MG/50ML IV EMUL
INTRAVENOUS | Status: AC
  Filled 2016-02-17: qty 50

## 2016-02-17 MED ORDER — FENTANYL CITRATE (PF) 100 MCG/2ML IJ SOLN
INTRAMUSCULAR | Status: AC
  Filled 2016-02-17: qty 2

## 2016-02-17 MED ORDER — LIDOCAINE HCL (PF) 2 % IJ SOLN
INTRAMUSCULAR | Status: AC
  Filled 2016-02-17: qty 2

## 2016-02-17 NOTE — Anesthesia Preprocedure Evaluation (Deleted)
Anesthesia Evaluation  Patient identified by MRN, date of birth, ID band Patient awake    Reviewed: Allergy & Precautions, NPO status , Patient's Chart, lab work & pertinent test results  Airway Mallampati: II       Dental  (+) Teeth Intact   Pulmonary asthma ,    breath sounds clear to auscultation       Cardiovascular Exercise Tolerance: Good  Rhythm:Regular  Severe Aortic Stenosis   Neuro/Psych    GI/Hepatic negative GI ROS, Neg liver ROS,   Endo/Other  negative endocrine ROS  Renal/GU negative Renal ROS     Musculoskeletal   Abdominal Normal abdominal exam  (+)   Peds  Hematology  (+) anemia ,   Anesthesia Other Findings   Reproductive/Obstetrics                             Anesthesia Physical Anesthesia Plan  ASA: III  Anesthesia Plan: General   Post-op Pain Management:    Induction: Intravenous  Airway Management Planned: Natural Airway and Nasal Cannula  Additional Equipment:   Intra-op Plan:   Post-operative Plan:   Informed Consent: I have reviewed the patients History and Physical, chart, labs and discussed the procedure including the risks, benefits and alternatives for the proposed anesthesia with the patient or authorized representative who has indicated his/her understanding and acceptance.     Plan Discussed with: Surgeon  Anesthesia Plan Comments:         Anesthesia Quick Evaluation

## 2016-02-20 ENCOUNTER — Encounter
Admission: RE | Disposition: A | Discharge status: Home / Self Care | Payer: Self-pay | Source: Ambulatory Visit | Attending: Unknown Physician Specialty

## 2016-02-20 ENCOUNTER — Ambulatory Visit
Admission: RE | Admit: 2016-02-20 | Discharge: 2016-02-20 | Disposition: A | Discharge status: Home / Self Care | Payer: Medicare Other | Source: Ambulatory Visit | Attending: Unknown Physician Specialty | Admitting: Unknown Physician Specialty

## 2016-02-20 DIAGNOSIS — K31811 Angiodysplasia of stomach and duodenum with bleeding: Secondary | ICD-10-CM | POA: Insufficient documentation

## 2016-02-20 DIAGNOSIS — D500 Iron deficiency anemia secondary to blood loss (chronic): Secondary | ICD-10-CM | POA: Insufficient documentation

## 2016-02-20 DIAGNOSIS — Z79899 Other long term (current) drug therapy: Secondary | ICD-10-CM | POA: Insufficient documentation

## 2016-02-20 HISTORY — PX: GIVENS CAPSULE STUDY: SHX5432

## 2016-02-20 SURGERY — IMAGING PROCEDURE, GI TRACT, INTRALUMINAL, VIA CAPSULE

## 2016-02-21 ENCOUNTER — Encounter: Payer: Self-pay | Admitting: Unknown Physician Specialty

## 2016-02-21 DIAGNOSIS — D500 Iron deficiency anemia secondary to blood loss (chronic): Secondary | ICD-10-CM | POA: Diagnosis not present

## 2016-02-21 DIAGNOSIS — Z79899 Other long term (current) drug therapy: Secondary | ICD-10-CM | POA: Diagnosis not present

## 2016-02-21 DIAGNOSIS — K31811 Angiodysplasia of stomach and duodenum with bleeding: Secondary | ICD-10-CM | POA: Diagnosis not present

## 2016-02-21 DIAGNOSIS — K922 Gastrointestinal hemorrhage, unspecified: Secondary | ICD-10-CM | POA: Diagnosis present

## 2016-02-22 NOTE — H&P (Signed)
Primary Care Physician:  Marguarite ArbourSPARKS,JEFFREY D, MD Primary Gastroenterologist:  Dr. Mechele CollinElliott  Pre-Procedure History & Physical: HPI:  Kelsey Le is a 83 y.o. female is here for an  Given Capsule Endoscopy.  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   Past Medical History:  Diagnosis Date  . Arrhythmia   . Asthma   . Broken back   . Broken wrist   . Broken wrist   . Fractured hip (HCC)   . Heart murmur   . Syncope and collapse     Past Surgical History:  Procedure Laterality Date  . ABDOMINAL HYSTERECTOMY    . APPENDECTOMY    . BACK SURGERY     back fusion  . ESOPHAGOGASTRODUODENOSCOPY (EGD) WITH PROPOFOL N/A 11/15/2015   Procedure: ESOPHAGOGASTRODUODENOSCOPY (EGD) WITH PROPOFOL;  Surgeon: Midge Miniumarren Wohl, MD;  Location: [facility];  Service: Endoscopy;  Laterality: N/A;  . FRACTURE SURGERY    . GIVENS CAPSULE STUDY N/A 02/20/2016   Procedure: GIVENS CAPSULE STUDY;  Surgeon: Scot Junobert T Elliott, MD;  [facility];  Service: Endoscopy;  Laterality: N/A;  . HIP FRACTURE SURGERY    . INTRAMEDULLARY (IM) NAIL INTERTROCHANTERIC Right 11/18/2015   Procedure: INTRAMEDULLARY (IM) NAIL INTERTROCHANTRIC;  Surgeon: Juanell FairlyKevin Krasinski, MD;  Location: [facility] ORS;  Service: Orthopedics;  Laterality: Right;  . KYPHOPLASTY N/A 08/19/2014   Procedure: KYPHOPLASTY;  Surgeon: Kennedy BuckerMichael Menz, MD;  Location: [facility] ORS;  Service: Orthopedics;  Laterality: N/A;  . PARTIAL HYSTERECTOMY    . TUBAL LIGATION      Prior to Admission medications   Medication Sig Start Date End Date Taking? Authorizing Provider  acetaminophen (TYLENOL) 500 MG tablet Take 500 mg by mouth as needed.     Historical Provider, MD  cholecalciferol (VITAMIN D) 400 units TABS tablet Take 400 Units by mouth daily.    Historical Provider, MD  vitamin B-12 (CYANOCOBALAMIN) 1000 MCG tablet Take 1,000 mcg by mouth daily.    Historical Provider, MD    Allergies as of 02/17/2016 - Review Complete 02/17/2016   Allergen Reaction Noted  . Penicillins  04/24/2013    Family History  Problem Relation Age of Onset  . Heart disease Sister   . Breast cancer Maternal Aunt     Social History   Social History  . Marital status: Married    Spouse name: N/A  . Number of children: N/A  . Years of education: N/A   Occupational History  . Not on file.   Social History Main Topics  . Smoking status: Never Smoker  . Smokeless tobacco: Never Used  . Alcohol use No  . Drug use: No  . Sexual activity: Not on file   Other Topics Concern  . Not on file   Social History Narrative  . No narrative on file    Review of Systems: See HPI, otherwise negative ROS  Physical Exam: There were no vitals taken for this visit. General:   Alert,  pleasant and cooperative in NAD Head:  Normocephalic and atraumatic. Neck:  Supple; no masses or thyromegaly. Lungs:  Clear throughout to auscultation.    Heart:  Regular rate and rhythm. Abdomen:  Soft, nontender and nondistended. Normal bowel sounds, without guarding, and without rebound.   Neurologic:  Alert and  oriented x4;  grossly normal neurologically.  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  Risks, benefits,  limitations, and alternatives regarding  Capsule endoscopy have been reviewed with the patient.  Questions have been answered.  All parties agreeable.   Lynnae Prude, MD  02/22/2016, 4:50 PM

## 2016-02-28 NOTE — Progress Notes (Signed)
[facility]  Telephone:(336) [phone]  Fax:([phone]     Kelsey Le DOB: [birth year]  MR#: [id number]  QIO#:[id number]#:[id number]  Patient Care Team: Marguarite ArbourJeffrey D Sparks, MD as PCP - General (Internal Medicine)  Chief complaint: Iron deficiency anemia secondary to GI blood loss.  INTERVAL HISTORY: Patient returns to clinic today for further evaluation and consideration of blood transfusion. She continues to complain of chronic weakness and fatigue. She continues to have right leg pain secondary to her femur fracture. She denies any other pain. He has no neurologic complaints. She denies any recent fevers or illnesses. She has no chest pain or cough, but continues to have occasional shortness of breath.  She denies nausea, vomiting, constipation, or diarrhea. She denies any recent melena or hematochezia. Her appetite is improving.  Patient offers no further specific complaints today.   REVIEW OF SYSTEMS:   Review of Systems  Constitutional: Positive for malaise/fatigue. Negative for chills and fever.  HENT: Negative.   Eyes: Negative.   Respiratory: Positive for shortness of breath. Negative for cough and hemoptysis.   Cardiovascular: Negative for chest pain, palpitations and leg swelling.  Gastrointestinal: Negative for blood in stool, constipation, diarrhea, melena, nausea and vomiting.  Genitourinary: Negative.   Musculoskeletal: Positive for joint pain.  Skin: Negative.   Neurological: Positive for weakness. Negative for dizziness and headaches.  Endo/Heme/Allergies: Negative.   Psychiatric/Behavioral: Negative.  The patient is not nervous/anxious.     As per HPI. Otherwise, a complete review of systems is negative.  ONCOLOGY HISTORY:  No history exists.    PAST MEDICAL HISTORY: Past Medical History:  Diagnosis Date  . Arrhythmia   . Asthma   . Broken back   . Broken wrist   . Broken wrist   . Fractured hip (HCC)   . Heart murmur   . Syncope and collapse      PAST SURGICAL HISTORY: Past Surgical History:  Procedure Laterality Date  . ABDOMINAL HYSTERECTOMY    . APPENDECTOMY    . BACK SURGERY     back fusion  . ESOPHAGOGASTRODUODENOSCOPY (EGD) WITH PROPOFOL N/A 11/15/2015   Procedure: ESOPHAGOGASTRODUODENOSCOPY (EGD) WITH PROPOFOL;  Surgeon: Midge Miniumarren Wohl, MD;  Location: [facility] ENDOSCOPY;  Service: Endoscopy;  Laterality: N/A;  . FRACTURE SURGERY    . GIVENS CAPSULE STUDY N/A 02/20/2016   Procedure: GIVENS CAPSULE STUDY;  Surgeon: Scot Junobert T Elliott, MD;  [facility];  Service: Endoscopy;  Laterality: N/A;  . HIP FRACTURE SURGERY    . INTRAMEDULLARY (IM) NAIL INTERTROCHANTERIC Right 11/18/2015   Procedure: INTRAMEDULLARY (IM) NAIL INTERTROCHANTRIC;  Surgeon: Juanell FairlyKevin Krasinski, MD;  Location: [facility] ORS;  Service: Orthopedics;  Laterality: Right;  . KYPHOPLASTY N/A 08/19/2014   Procedure: KYPHOPLASTY;  Surgeon: Kennedy BuckerMichael Menz, MD;  Location: [facility] ORS;  Service: Orthopedics;  Laterality: N/A;  . PARTIAL HYSTERECTOMY    . TUBAL LIGATION      FAMILY HISTORY Family History  Problem Relation Age of Onset  . Heart disease Sister   . Breast cancer Maternal Aunt     GYNECOLOGIC HISTORY:  No LMP recorded. Patient is postmenopausal.     ADVANCED DIRECTIVES:    HEALTH MAINTENANCE: Social History  Substance Use Topics  . Smoking status: Never Smoker  . Smokeless tobacco: Never Used  . Alcohol use No    Allergies  Allergen Reactions  . Penicillins     antibiotics    Current Outpatient Prescriptions  Medication Sig Dispense Refill  . acetaminophen (TYLENOL) 500 MG tablet Take 500  mg by mouth as needed.     . cholecalciferol (VITAMIN D) 400 units TABS tablet Take 400 Units by mouth daily.    . vitamin B-12 (CYANOCOBALAMIN) 1000 MCG tablet Take 1,000 mcg by mouth daily.     No current facility-administered medications for this visit.     OBJECTIVE: BP (!) 148/55 (BP Location: Left Arm, Patient Position: Sitting)   Pulse 67    Temp 99.2 F (37.3 C) (Tympanic)   Wt 113 lb 13.9 oz (51.6 kg)   SpO2 99%   BMI 18.66 kg/m    Body mass index is 18.66 kg/m.    ECOG FS:1 - Symptomatic but completely ambulatory  General: Well-developed, well-nourished, no acute distress. Eyes: Pink conjunctiva, anicteric sclera. HEENT: Normocephalic, moist mucous membranes, clear oropharnyx. Lungs: Clear to auscultation bilaterally. Heart: Regular rate and rhythm. Positive for murmur Musculoskeletal: Non-pitting edema in bilateral ankles. No cyanosis or clubbing. Neuro: Alert, answering all questions appropriately.  Skin: No rashes or petechiae noted. Psych: Normal affect.   LAB RESULTS:  Appointment on 02/29/2016  Component Date Value Ref Range Status  . WBC 02/29/2016 4.7  3.6 - 11.0 K/uL Final  . RBC 02/29/2016 3.38* 3.80 - 5.20 MIL/uL Final  . Hemoglobin 02/29/2016 10.7* 12.0 - 16.0 g/dL Final  . HCT 40/98/1191 32.0* 35.0 - 47.0 % Final  . MCV 02/29/2016 94.7  80.0 - 100.0 fL Final  . MCH 02/29/2016 31.7  26.0 - 34.0 pg Final  . MCHC 02/29/2016 33.5  32.0 - 36.0 g/dL Final  . RDW 47/82/9562 16.2* 11.5 - 14.5 % Final  . Platelets 02/29/2016 169  150 - 440 K/uL Final  . Neutrophils Relative % 02/29/2016 69  % Final  . Neutro Abs 02/29/2016 3.3  1.4 - 6.5 K/uL Final  . Lymphocytes Relative 02/29/2016 21  % Final  . Lymphs Abs 02/29/2016 1.0  1.0 - 3.6 K/uL Final  . Monocytes Relative 02/29/2016 7  % Final  . Monocytes Absolute 02/29/2016 0.3  0.2 - 0.9 K/uL Final  . Eosinophils Relative 02/29/2016 2  % Final  . Eosinophils Absolute 02/29/2016 0.1  0 - 0.7 K/uL Final  . Basophils Relative 02/29/2016 1  % Final  . Basophils Absolute 02/29/2016 0.1  0 - 0.1 K/uL Final  . Blood Bank Specimen 02/29/2016 SAMPLE AVAILABLE FOR TESTING   Final  . Sample Expiration 02/29/2016 03/03/2016   Final   Lab Results  Component Value Date   IRON 61 01/25/2016   TIBC 256 01/25/2016   IRONPCTSAT 24 01/25/2016   Lab Results  Component  Value Date   FERRITIN 209 01/25/2016    STUDIES: No results found.  ASSESSMENT: Iron deficiency anemia.  PLAN:    1. Iron deficiency anemia: History of intermittent dark stools, recently reported negative EGD, colonoscopy, capsule endoscopy completed in [place] in 2015. Patient had a repeat capsule endoscopy recently. We do not have these results, but patient reports that was "normal". EGD and colonoscopy were not repeated today. She does not require additional transfusion today. Return to clinic in 3 month with repeat laboratory work and further evaluation.  2. Fractured femur: Continue physical therapy and rehabilitation per orthopedics. 3. GI bleed: No obvious source identified. Capsule endoscopy as above..  Patient expressed understanding and was in agreement with this plan. She also understands that She can call clinic at any time with any questions, concerns, or complaints.    Jeralyn Ruths, MD 03/03/16 9:04 AM

## 2016-02-29 ENCOUNTER — Inpatient Hospital Stay (HOSPITAL_BASED_OUTPATIENT_CLINIC_OR_DEPARTMENT_OTHER): Payer: Medicare Other | Admitting: Oncology

## 2016-02-29 ENCOUNTER — Encounter: Payer: Self-pay | Admitting: Oncology

## 2016-02-29 ENCOUNTER — Inpatient Hospital Stay: Payer: Medicare Other

## 2016-02-29 VITALS — BP 148/55 | HR 67 | Temp 99.2°F | Wt 113.9 lb

## 2016-02-29 DIAGNOSIS — K921 Melena: Secondary | ICD-10-CM

## 2016-02-29 DIAGNOSIS — Z803 Family history of malignant neoplasm of breast: Secondary | ICD-10-CM

## 2016-02-29 DIAGNOSIS — Z79899 Other long term (current) drug therapy: Secondary | ICD-10-CM | POA: Diagnosis not present

## 2016-02-29 DIAGNOSIS — M898X5 Other specified disorders of bone, thigh: Secondary | ICD-10-CM | POA: Diagnosis not present

## 2016-02-29 DIAGNOSIS — D500 Iron deficiency anemia secondary to blood loss (chronic): Secondary | ICD-10-CM

## 2016-02-29 DIAGNOSIS — R011 Cardiac murmur, unspecified: Secondary | ICD-10-CM | POA: Diagnosis not present

## 2016-02-29 DIAGNOSIS — K922 Gastrointestinal hemorrhage, unspecified: Secondary | ICD-10-CM | POA: Diagnosis not present

## 2016-02-29 DIAGNOSIS — Z9049 Acquired absence of other specified parts of digestive tract: Secondary | ICD-10-CM

## 2016-02-29 DIAGNOSIS — M7989 Other specified soft tissue disorders: Secondary | ICD-10-CM

## 2016-02-29 DIAGNOSIS — M2550 Pain in unspecified joint: Secondary | ICD-10-CM

## 2016-02-29 DIAGNOSIS — Z8781 Personal history of (healed) traumatic fracture: Secondary | ICD-10-CM

## 2016-02-29 DIAGNOSIS — R531 Weakness: Secondary | ICD-10-CM

## 2016-02-29 DIAGNOSIS — R5383 Other fatigue: Secondary | ICD-10-CM

## 2016-02-29 DIAGNOSIS — R0609 Other forms of dyspnea: Secondary | ICD-10-CM

## 2016-02-29 DIAGNOSIS — J45909 Unspecified asthma, uncomplicated: Secondary | ICD-10-CM

## 2016-02-29 LAB — CBC WITH DIFFERENTIAL/PLATELET
BASOS PCT: 1 %
Basophils Absolute: 0.1 10*3/uL (ref 0–0.1)
EOS ABS: 0.1 10*3/uL (ref 0–0.7)
EOS PCT: 2 %
HCT: 32 % — ABNORMAL LOW (ref 35.0–47.0)
HEMOGLOBIN: 10.7 g/dL — AB (ref 12.0–16.0)
LYMPHS ABS: 1 10*3/uL (ref 1.0–3.6)
Lymphocytes Relative: 21 %
MCH: 31.7 pg (ref 26.0–34.0)
MCHC: 33.5 g/dL (ref 32.0–36.0)
MCV: 94.7 fL (ref 80.0–100.0)
MONO ABS: 0.3 10*3/uL (ref 0.2–0.9)
MONOS PCT: 7 %
Neutro Abs: 3.3 10*3/uL (ref 1.4–6.5)
Neutrophils Relative %: 69 %
PLATELETS: 169 10*3/uL (ref 150–440)
RBC: 3.38 MIL/uL — ABNORMAL LOW (ref 3.80–5.20)
RDW: 16.2 % — AB (ref 11.5–14.5)
WBC: 4.7 10*3/uL (ref 3.6–11.0)

## 2016-02-29 LAB — SAMPLE TO BLOOD BANK

## 2016-02-29 NOTE — Progress Notes (Signed)
No blood for Ms. Mazzola today, per [place].  LJ

## 2016-04-16 ENCOUNTER — Ambulatory Visit: Payer: Medicare Other

## 2016-04-16 ENCOUNTER — Other Ambulatory Visit: Payer: Medicare Other

## 2016-04-16 ENCOUNTER — Ambulatory Visit: Payer: Medicare Other | Admitting: Oncology

## 2016-05-30 NOTE — Progress Notes (Signed)
[facility]  Telephone:(336) [phone]  Fax:(336) [phone]     KERBY HOCKLEY DOB: January 09, 1930  MR#: [id number]  AOZ#:[id number]  Patient Care Team: Marguarite Arbour, MD as PCP - General (Internal Medicine)  Chief complaint: Iron deficiency anemia secondary to GI blood loss.  INTERVAL HISTORY: Patient returns to clinic today for further evaluation and laboratory work. She does not complain of weakness or fatigue today. She continues to have right leg/hip pain secondary to her history of femur fracture. She denies any other pain. He has no neurologic complaints. She denies any recent fevers or illnesses. She has no chest pain, shortness of breath, or cough.  She denies nausea, vomiting, constipation, or diarrhea. She denies any recent melena or hematochezia.  Patient offers no further specific complaints today.   REVIEW OF SYSTEMS:   Review of Systems  Constitutional: Negative for chills, fever and malaise/fatigue.  HENT: Negative.   Eyes: Negative.   Respiratory: Negative for cough, hemoptysis and shortness of breath.   Cardiovascular: Negative for chest pain, palpitations and leg swelling.  Gastrointestinal: Negative for blood in stool, constipation, diarrhea, melena, nausea and vomiting.  Genitourinary: Negative.   Musculoskeletal: Positive for joint pain.  Skin: Negative.  Negative for rash.  Neurological: Negative.  Negative for dizziness, weakness and headaches.  Endo/Heme/Allergies: Negative.   Psychiatric/Behavioral: Negative.  The patient is not nervous/anxious.     As per HPI. Otherwise, a complete review of systems is negative.  ONCOLOGY HISTORY:  No history exists.    PAST MEDICAL HISTORY: Past Medical History:  Diagnosis Date  . Arrhythmia   . Asthma   . Broken back   . Broken wrist   . Broken wrist   . Fractured hip (HCC)   . Heart murmur   . Syncope and collapse     PAST SURGICAL HISTORY: Past Surgical History:  Procedure Laterality Date    . ABDOMINAL HYSTERECTOMY    . APPENDECTOMY    . BACK SURGERY     back fusion  . ESOPHAGOGASTRODUODENOSCOPY (EGD) WITH PROPOFOL N/A 11/15/2015   Procedure: ESOPHAGOGASTRODUODENOSCOPY (EGD) WITH PROPOFOL;  Surgeon: [name], MD;  Location: [facility] ENDOSCOPY;  Service: Endoscopy;  Laterality: N/A;  . FRACTURE SURGERY    . GIVENS CAPSULE STUDY N/A 02/20/2016   Procedure: GIVENS CAPSULE STUDY;  Surgeon: Scot Jun, MD;  Location: [facility] ENDOSCOPY;  Service: Endoscopy;  Laterality: N/A;  . HIP FRACTURE SURGERY    . INTRAMEDULLARY (IM) NAIL INTERTROCHANTERIC Right 11/18/2015   Procedure: INTRAMEDULLARY (IM) NAIL INTERTROCHANTRIC;  Surgeon: Juanell Fairly, MD;  Location: [facility] ORS;  Service: Orthopedics;  Laterality: Right;  . KYPHOPLASTY N/A 08/19/2014   Procedure: KYPHOPLASTY;  Surgeon: Kennedy Bucker, MD;  Location: [facility] ORS;  Service: Orthopedics;  Laterality: N/A;  . PARTIAL HYSTERECTOMY    . TUBAL LIGATION      FAMILY HISTORY Family History  Problem Relation Age of Onset  . Heart disease Sister   . Breast cancer Maternal Aunt     GYNECOLOGIC HISTORY:  No LMP recorded. Patient is postmenopausal.     ADVANCED DIRECTIVES:    HEALTH MAINTENANCE: Social History  Substance Use Topics  . Smoking status: Never Smoker  . Smokeless tobacco: Never Used  . Alcohol use No    Allergies  Allergen Reactions  . Penicillins     antibiotics    Current Outpatient Prescriptions  Medication Sig Dispense Refill  . acetaminophen (TYLENOL) 500 MG tablet Take 500 mg by mouth as needed.     Marland Kitchen  cholecalciferol (VITAMIN D) 400 units TABS tablet Take 400 Units by mouth daily.    . vitamin B-12 (CYANOCOBALAMIN) 1000 MCG tablet Take 1,000 mcg by mouth daily.     No current facility-administered medications for this visit.     OBJECTIVE: BP (!) 158/76   Pulse 88   Temp 97.8 F (36.6 C) (Tympanic)   Resp 18   Wt 114 lb 6.4 oz (51.9 kg)   BMI 18.75 kg/m    Body mass index is 18.75 kg/m.     ECOG FS:1 - Symptomatic but completely ambulatory  General: Well-developed, well-nourished, no acute distress. Eyes: Pink conjunctiva, anicteric sclera. HEENT: Normocephalic, moist mucous membranes, clear oropharnyx. Lungs: Clear to auscultation bilaterally. Heart: Regular rate and rhythm. Positive for murmur Musculoskeletal: Non-pitting edema in bilateral ankles. No cyanosis or clubbing. Neuro: Alert, answering all questions appropriately.  Skin: No rashes or petechiae noted. Psych: Normal affect.   LAB RESULTS:  Appointment on 05/31/2016  Component Date Value Ref Range Status  . WBC 05/31/2016 4.6  3.6 - 11.0 K/uL Final  . RBC 05/31/2016 3.41* 3.80 - 5.20 MIL/uL Final  . Hemoglobin 05/31/2016 10.7* 12.0 - 16.0 g/dL Final  . HCT 57/84/696205/31/2018 31.3* 35.0 - 47.0 % Final  . MCV 05/31/2016 91.8  80.0 - 100.0 fL Final  . MCH 05/31/2016 31.4  26.0 - 34.0 pg Final  . MCHC 05/31/2016 34.2  32.0 - 36.0 g/dL Final  . RDW 95/28/413205/31/2018 14.5  11.5 - 14.5 % Final  . Platelets 05/31/2016 178  150 - 440 K/uL Final  . Neutrophils Relative % 05/31/2016 66  % Final  . Neutro Abs 05/31/2016 3.0  1.4 - 6.5 K/uL Final  . Lymphocytes Relative 05/31/2016 26  % Final  . Lymphs Abs 05/31/2016 1.2  1.0 - 3.6 K/uL Final  . Monocytes Relative 05/31/2016 6  % Final  . Monocytes Absolute 05/31/2016 0.3  0.2 - 0.9 K/uL Final  . Eosinophils Relative 05/31/2016 1  % Final  . Eosinophils Absolute 05/31/2016 0.1  0 - 0.7 K/uL Final  . Basophils Relative 05/31/2016 1  % Final  . Basophils Absolute 05/31/2016 0.1  0 - 0.1 K/uL Final  . Blood Bank Specimen 05/31/2016 SAMPLE AVAILABLE FOR TESTING   Final  . Sample Expiration 05/31/2016 06/03/2016   Final  . Iron 05/31/2016 68  28 - 170 ug/dL Final  . TIBC 44/01/027205/31/2018 278  250 - 450 ug/dL Final  . Saturation Ratios 05/31/2016 25  10.4 - 31.8 % Final  . UIBC 05/31/2016 210  ug/dL Final   Lab Results  Component Value Date   IRON 68 05/31/2016   TIBC 278 05/31/2016    IRONPCTSAT 25 05/31/2016   Lab Results  Component Value Date   FERRITIN 209 01/25/2016    STUDIES: No results found.  ASSESSMENT: Iron deficiency anemia.  PLAN:    1. Iron deficiency anemia: Patient has a history of intermittent dark stools, but reports a negative EGD, colonoscopy, [place] in [place]. Patient had a repeat capsule endoscopy recently. We do not have these results, but patient reports that was "normal". Patient's hemoglobin is decreased, but stable. Her iron stores within normal limits. No intervention is needed. Return to clinic in 3 month with repeat laboratory work and further evaluation.  2. Right leg pain: Resolved. Continue physical therapy and rehabilitation per orthopedics. 3. GI bleed: No obvious source identified.  Patient expressed understanding and was in agreement with this plan. She also understands that She  can call clinic at any time with any questions, concerns, or complaints.    Jeralyn Ruths, MD 06/03/16 11:23 PM

## 2016-05-31 ENCOUNTER — Inpatient Hospital Stay: Payer: Medicare Other | Attending: Oncology

## 2016-05-31 ENCOUNTER — Inpatient Hospital Stay (HOSPITAL_BASED_OUTPATIENT_CLINIC_OR_DEPARTMENT_OTHER): Payer: Medicare Other | Admitting: Oncology

## 2016-05-31 VITALS — BP 158/76 | HR 88 | Temp 97.8°F | Resp 18 | Wt 114.4 lb

## 2016-05-31 DIAGNOSIS — J45909 Unspecified asthma, uncomplicated: Secondary | ICD-10-CM | POA: Insufficient documentation

## 2016-05-31 DIAGNOSIS — Z803 Family history of malignant neoplasm of breast: Secondary | ICD-10-CM

## 2016-05-31 DIAGNOSIS — I499 Cardiac arrhythmia, unspecified: Secondary | ICD-10-CM | POA: Diagnosis not present

## 2016-05-31 DIAGNOSIS — Z8781 Personal history of (healed) traumatic fracture: Secondary | ICD-10-CM

## 2016-05-31 DIAGNOSIS — M2550 Pain in unspecified joint: Secondary | ICD-10-CM | POA: Diagnosis not present

## 2016-05-31 DIAGNOSIS — Z79899 Other long term (current) drug therapy: Secondary | ICD-10-CM

## 2016-05-31 DIAGNOSIS — K922 Gastrointestinal hemorrhage, unspecified: Secondary | ICD-10-CM | POA: Insufficient documentation

## 2016-05-31 DIAGNOSIS — D500 Iron deficiency anemia secondary to blood loss (chronic): Secondary | ICD-10-CM

## 2016-05-31 DIAGNOSIS — R011 Cardiac murmur, unspecified: Secondary | ICD-10-CM | POA: Diagnosis not present

## 2016-05-31 LAB — CBC WITH DIFFERENTIAL/PLATELET
BASOS ABS: 0.1 10*3/uL (ref 0–0.1)
Basophils Relative: 1 %
Eosinophils Absolute: 0.1 10*3/uL (ref 0–0.7)
Eosinophils Relative: 1 %
HEMATOCRIT: 31.3 % — AB (ref 35.0–47.0)
HEMOGLOBIN: 10.7 g/dL — AB (ref 12.0–16.0)
LYMPHS PCT: 26 %
Lymphs Abs: 1.2 10*3/uL (ref 1.0–3.6)
MCH: 31.4 pg (ref 26.0–34.0)
MCHC: 34.2 g/dL (ref 32.0–36.0)
MCV: 91.8 fL (ref 80.0–100.0)
Monocytes Absolute: 0.3 10*3/uL (ref 0.2–0.9)
Monocytes Relative: 6 %
NEUTROS ABS: 3 10*3/uL (ref 1.4–6.5)
Neutrophils Relative %: 66 %
Platelets: 178 10*3/uL (ref 150–440)
RBC: 3.41 MIL/uL — ABNORMAL LOW (ref 3.80–5.20)
RDW: 14.5 % (ref 11.5–14.5)
WBC: 4.6 10*3/uL (ref 3.6–11.0)

## 2016-05-31 LAB — IRON AND TIBC
IRON: 68 ug/dL (ref 28–170)
Saturation Ratios: 25 % (ref 10.4–31.8)
TIBC: 278 ug/dL (ref 250–450)
UIBC: 210 ug/dL

## 2016-05-31 LAB — SAMPLE TO BLOOD BANK

## 2016-05-31 NOTE — Progress Notes (Signed)
Complains of right hip pain due to old injury. Pt scheduled to start receiving injections in hip to help with pain.

## 2016-08-10 ENCOUNTER — Ambulatory Visit: Payer: Medicare Other | Admitting: Cardiovascular Disease

## 2016-08-17 ENCOUNTER — Encounter: Payer: Self-pay | Admitting: Physician Assistant

## 2016-08-17 ENCOUNTER — Ambulatory Visit (INDEPENDENT_AMBULATORY_CARE_PROVIDER_SITE_OTHER): Payer: Medicare Other | Admitting: Physician Assistant

## 2016-08-17 VITALS — BP 130/48 | HR 72 | Ht 65.0 in | Wt 114.0 lb

## 2016-08-17 DIAGNOSIS — I359 Nonrheumatic aortic valve disorder, unspecified: Secondary | ICD-10-CM | POA: Diagnosis not present

## 2016-08-17 DIAGNOSIS — I350 Nonrheumatic aortic (valve) stenosis: Secondary | ICD-10-CM

## 2016-08-17 DIAGNOSIS — D500 Iron deficiency anemia secondary to blood loss (chronic): Secondary | ICD-10-CM

## 2016-08-17 NOTE — Patient Instructions (Signed)
Testing/Procedures: Your physician has requested that you have an echocardiogram. Echocardiography is a painless test that uses sound waves to create images of your heart. It provides your doctor with information about the size and shape of your heart and how well your heart's chambers and valves are working. This procedure takes approximately one hour. There are no restrictions for this procedure.    Follow-Up: Your physician wants you to follow-up in: 6 months with Dr. Kirke Corin. You will receive a reminder letter in the mail two months in advance. If you don't receive a letter, please call our office to schedule the follow-up appointment.  It was a pleasure seeing you today here in the office. Please do not hesitate to give [place] a call back if you have any further questions. [phone]  Aptos Cellar RN, BSN

## 2016-08-17 NOTE — Progress Notes (Signed)
Cardiology Office Note Date:  08/17/2016  Patient ID:  Kelsey Le, Kelsey Le May 07, 1930, MRN [id number] PCP:  Marguarite Arbour, MD  Cardiologist:  Dr. Kirke Corin, MD    Chief Complaint: Follow up aortic stenosis  History of Present Illness: MALAIYA PACZKOWSKI is a 83 y.o. female with history of moderate to severe aortic stenosis, recurrent GI bleeding with AVM and iron deficiency anemia previously requiring blood trnasfusions with prior HGB as low as 4.8, and mechanical fall in 11/2015 with fractured right hip who presents for 6 month follow up of aortic stenosis.   Recent capsule endoscopy in 02/2016 showing AVM of small intestine leading to bleeding events. Prior EGD, colonoscopy, and capsule study in [place] normal per patient. Echo from 08/2015 showed normal LV systolic function, moderate to severe aortic stenosis with a mean gradient of 28 mmHg and a valve area of 0.97, moderate pulmonary hypertension and a small pericardial effusion. She was seen by hematology/oncology in late May 2018 for follow up of her anemia and was noted to have a stable HGb at that time of 10.7, not requiring transfusion.    She comes in doing well today from a cardiac standpoint. She reports she would just like to "get my heart checked out after suffering the above mechanical fall leading to a fractured right hip in November, 2017. She denies any dizziness, presyncope, syncope or chest pain. No shortness of breath. She continues to walk her dog daily using her walker. She tries to remain as active as possible. She reports a vigorous diet. She does indicate she is lonely at times.   Past Medical History:  Diagnosis Date  . Aortic stenosis    a. TTE 8/17: nl EF, mod to sev AS w/ mean gradient 28 mmHg, valve area 0.97, mod pulm HTN, small pericardial effusion  . Arrhythmia   . Asthma   . AVM (arteriovenous malformation)    a. small intestine   . Broken back   . Broken wrist   . Hip fracture (HCC)    a. in the  setting of mechanical fall  . Iron deficiency anemia    a. requiring periodic transfusions  . Syncope and collapse     Past Surgical History:  Procedure Laterality Date  . ABDOMINAL HYSTERECTOMY    . APPENDECTOMY    . BACK SURGERY     back fusion  . ESOPHAGOGASTRODUODENOSCOPY (EGD) WITH PROPOFOL N/A 11/15/2015   Procedure: ESOPHAGOGASTRODUODENOSCOPY (EGD) WITH PROPOFOL;  Surgeon: [name], MD;  Location: [facility] ENDOSCOPY;  Service: Endoscopy;  Laterality: N/A;  . FRACTURE SURGERY    . GIVENS CAPSULE STUDY N/A 02/20/2016   Procedure: GIVENS CAPSULE STUDY;  Surgeon: Scot Jun, MD;  Location: [facility] ENDOSCOPY;  Service: Endoscopy;  Laterality: N/A;  . HIP FRACTURE SURGERY    . INTRAMEDULLARY (IM) NAIL INTERTROCHANTERIC Right 11/18/2015   Procedure: INTRAMEDULLARY (IM) NAIL INTERTROCHANTRIC;  Surgeon: Juanell Fairly, MD;  Location: [facility] ORS;  Service: Orthopedics;  Laterality: Right;  . KYPHOPLASTY N/A 08/19/2014   Procedure: KYPHOPLASTY;  Surgeon: Kennedy Bucker, MD;  Location: [facility] ORS;  Service: Orthopedics;  Laterality: N/A;  . PARTIAL HYSTERECTOMY    . TUBAL LIGATION      Current Meds  Medication Sig  . acetaminophen (TYLENOL) 500 MG tablet Take 500 mg by mouth as needed.   . cholecalciferol (VITAMIN D) 400 units TABS tablet Take 400 Units by mouth daily.  . vitamin B-12 (CYANOCOBALAMIN) 1000 MCG tablet Take 1,000 mcg by mouth daily.  Allergies:   Penicillins   Social History:  The patient  reports that she has never smoked. She has never used smokeless tobacco. She reports that she does not drink alcohol or use drugs.   Family History:  The patient's family history includes Breast cancer in her maternal aunt; Heart disease in her sister.  ROS:   Review of Systems  Constitutional: Positive for malaise/fatigue. Negative for chills, diaphoresis, fever and weight loss.  HENT: Negative for congestion.   Eyes: Negative for discharge and redness.  Respiratory: Negative for  cough, hemoptysis, sputum production, shortness of breath and wheezing.   Cardiovascular: Negative for chest pain, palpitations, orthopnea, claudication, leg swelling and PND.  Gastrointestinal: Negative for abdominal pain, blood in stool, heartburn, melena, nausea and vomiting.  Genitourinary: Negative for hematuria.  Musculoskeletal: Positive for joint pain. Negative for falls and myalgias.  Skin: Negative for rash.  Neurological: Negative for dizziness, tingling, tremors, sensory change, speech change, focal weakness, loss of consciousness and weakness.  Endo/Heme/Allergies: Does not bruise/bleed easily.  Psychiatric/Behavioral: Negative for substance abuse. The patient is not nervous/anxious.   All other systems reviewed and are negative.    PHYSICAL EXAM:  VS:  BP (!) 130/48 (BP Location: Left Arm, Patient Position: Sitting, Cuff Size: Normal)   Pulse 72   Ht 5\' 5"  (1.651 m)   Wt 114 lb (51.7 kg)   BMI 18.97 kg/m  BMI: Body mass index is 18.97 kg/m.  Physical Exam  Constitutional: She is oriented to person, place, and time. She appears well-developed and well-nourished.  HENT:  Head: Normocephalic and atraumatic.  Eyes: Right eye exhibits no discharge. Left eye exhibits no discharge.  Neck: Normal range of motion. No JVD present.  Cardiovascular: Normal rate, regular rhythm, S1 normal and S2 normal.  Exam reveals no distant heart sounds, no friction rub, no midsystolic click and no opening snap.   Murmur heard.  Harsh midsystolic murmur is present with a grade of 3/6  at the upper right sternal border radiating to the neck Pulmonary/Chest: Effort normal and breath sounds normal. No respiratory distress. She has no decreased breath sounds. She has no wheezes. She has no rales. She exhibits no tenderness.  Abdominal: Soft. She exhibits no distension. There is no tenderness.  Musculoskeletal: She exhibits no edema.  Neurological: She is alert and oriented to person, place, and  time.  Skin: Skin is warm and dry. No cyanosis. Nails show no clubbing.  Psychiatric: She has a normal mood and affect. Her speech is normal and behavior is normal. Judgment and thought content normal.    EKG:  Was ordered and interpreted by me today. Shows NSR, 72 bpm, LVH with early repolarization, nonspecific lateral ST-T changes  Recent Labs: 11/17/2015: ALT 16 12/01/2015: BUN 30; Creatinine, Ser 0.93; Potassium 4.4; Sodium 142 05/31/2016: Hemoglobin 10.7; Platelets 178  No results found for requested labs within last 8760 hours.   CrCl cannot be calculated (Patient's most recent lab result is older than the maximum 21 days allowed.).   Wt Readings from Last 3 Encounters:  08/17/16 114 lb (51.7 kg)  05/31/16 114 lb 6.4 oz (51.9 kg)  02/29/16 113 lb 13.9 oz (51.6 kg)     Other studies reviewed: Additional studies/records reviewed today include: summarized above  ASSESSMENT AND PLAN:  1. Moderate to severe aortic stenosis: Clinically she appears to be asymptomatic from her aortic valvular heart disease. We will schedule transthoracic echocardiogram to evaluate for stability and her stenosis. She has previously been felt  she may not be a good candidate for any type of aortic valve procedure given her age, comorbidities, and poor functional capacity. Await echocardiogram as above.  2. Iron deficiency anemia/small intestinal AVM: Most recent hemoglobin stable. Continue to follow-up with PCP and oncology.  Disposition: F/u with Dr. Kirke Corin, MD in 6 months.   Current medicines are reviewed at length with the patient today.  The patient did not have any concerns regarding medicines.  Elinor Dodge PA-C 08/17/2016 3:26 PM     [facility] [place] [address] [place], [place] [address] [phone]

## 2016-08-28 ENCOUNTER — Other Ambulatory Visit: Payer: Self-pay

## 2016-08-28 ENCOUNTER — Ambulatory Visit (INDEPENDENT_AMBULATORY_CARE_PROVIDER_SITE_OTHER): Payer: Medicare Other

## 2016-08-28 DIAGNOSIS — I350 Nonrheumatic aortic (valve) stenosis: Secondary | ICD-10-CM

## 2016-08-28 DIAGNOSIS — I359 Nonrheumatic aortic valve disorder, unspecified: Secondary | ICD-10-CM | POA: Diagnosis not present

## 2016-08-28 DIAGNOSIS — D500 Iron deficiency anemia secondary to blood loss (chronic): Secondary | ICD-10-CM | POA: Diagnosis not present

## 2016-08-28 NOTE — Progress Notes (Deleted)
[facility]  Telephone:(336) [phone]  Fax:([phone]     Kelsey Le DOB: [birth year]  MR#: [id number]  QIO#:[id number]  Patient Care Team: Marguarite Arbour, MD as PCP - General (Internal Medicine)  Chief complaint: Iron deficiency anemia secondary to GI blood loss.  INTERVAL HISTORY: Patient returns to clinic today for further evaluation and laboratory work. She does not complain of weakness or fatigue today. She continues to have right leg/hip pain secondary to her history of femur fracture. She denies any other pain. He has no neurologic complaints. She denies any recent fevers or illnesses. She has no chest pain, shortness of breath, or cough.  She denies nausea, vomiting, constipation, or diarrhea. She denies any recent melena or hematochezia.  Patient offers no further specific complaints today.   REVIEW OF SYSTEMS:   Review of Systems  Constitutional: Negative for chills, fever and malaise/fatigue.  HENT: Negative.   Eyes: Negative.   Respiratory: Negative for cough, hemoptysis and shortness of breath.   Cardiovascular: Negative for chest pain, palpitations and leg swelling.  Gastrointestinal: Negative for blood in stool, constipation, diarrhea, melena, nausea and vomiting.  Genitourinary: Negative.   Musculoskeletal: Positive for joint pain.  Skin: Negative.  Negative for rash.  Neurological: Negative.  Negative for dizziness, weakness and headaches.  Endo/Heme/Allergies: Negative.   Psychiatric/Behavioral: Negative.  The patient is not nervous/anxious.     As per HPI. Otherwise, a complete review of systems is negative.  ONCOLOGY HISTORY:  No history exists.    PAST MEDICAL HISTORY: Past Medical History:  Diagnosis Date  . Aortic stenosis    a. TTE 8/17: nl EF, mod to sev AS w/ mean gradient 28 mmHg, valve area 0.97, mod pulm HTN, small pericardial effusion  . Arrhythmia   . Asthma   . AVM (arteriovenous malformation)    a. small intestine     . Broken back   . Broken wrist   . Hip fracture (HCC)    a. in the setting of mechanical fall  . Iron deficiency anemia    a. requiring periodic transfusions  . Syncope and collapse     PAST SURGICAL HISTORY: Past Surgical History:  Procedure Laterality Date  . ABDOMINAL HYSTERECTOMY    . APPENDECTOMY    . BACK SURGERY     back fusion  . ESOPHAGOGASTRODUODENOSCOPY (EGD) WITH PROPOFOL N/A 11/15/2015   Procedure: ESOPHAGOGASTRODUODENOSCOPY (EGD) WITH PROPOFOL;  Surgeon: [name], MD;  Location: [facility] ENDOSCOPY;  Service: Endoscopy;  Laterality: N/A;  . FRACTURE SURGERY    . GIVENS CAPSULE STUDY N/A 02/20/2016   Procedure: GIVENS CAPSULE STUDY;  Surgeon: Scot Jun, MD;  Location: [facility] ENDOSCOPY;  Service: Endoscopy;  Laterality: N/A;  . HIP FRACTURE SURGERY    . INTRAMEDULLARY (IM) NAIL INTERTROCHANTERIC Right 11/18/2015   Procedure: INTRAMEDULLARY (IM) NAIL INTERTROCHANTRIC;  Surgeon: Juanell Fairly, MD;  Location: [facility] ORS;  Service: Orthopedics;  Laterality: Right;  . KYPHOPLASTY N/A 08/19/2014   Procedure: KYPHOPLASTY;  Surgeon: Kennedy Bucker, MD;  Location: [facility] ORS;  Service: Orthopedics;  Laterality: N/A;  . PARTIAL HYSTERECTOMY    . TUBAL LIGATION      FAMILY HISTORY Family History  Problem Relation Age of Onset  . Heart disease Sister   . Breast cancer Maternal Aunt     GYNECOLOGIC HISTORY:  No LMP recorded. Patient is postmenopausal.     ADVANCED DIRECTIVES:    HEALTH MAINTENANCE: Social History  Substance Use Topics  . Smoking status: Never Smoker  .  Smokeless tobacco: Never Used  . Alcohol use No    Allergies  Allergen Reactions  . Penicillins     antibiotics    Current Outpatient Prescriptions  Medication Sig Dispense Refill  . acetaminophen (TYLENOL) 500 MG tablet Take 500 mg by mouth as needed.     . cholecalciferol (VITAMIN D) 400 units TABS tablet Take 400 Units by mouth daily.    . vitamin B-12 (CYANOCOBALAMIN) 1000 MCG tablet Take  1,000 mcg by mouth daily.     No current facility-administered medications for this visit.     OBJECTIVE: There were no vitals taken for this visit.   There is no height or weight on file to calculate BMI.    ECOG FS:1 - Symptomatic but completely ambulatory  General: Well-developed, well-nourished, no acute distress. Eyes: Pink conjunctiva, anicteric sclera. HEENT: Normocephalic, moist mucous membranes, clear oropharnyx. Lungs: Clear to auscultation bilaterally. Heart: Regular rate and rhythm. Positive for murmur Musculoskeletal: Non-pitting edema in bilateral ankles. No cyanosis or clubbing. Neuro: Alert, answering all questions appropriately.  Skin: No rashes or petechiae noted. Psych: Normal affect.   LAB RESULTS:  No visits with results within 3 Day(s) from this visit.  Latest known visit with results is:  Appointment on 05/31/2016  Component Date Value Ref Range Status  . WBC 05/31/2016 4.6  3.6 - 11.0 K/uL Final  . RBC 05/31/2016 3.41* 3.80 - 5.20 MIL/uL Final  . Hemoglobin 05/31/2016 10.7* 12.0 - 16.0 g/dL Final  . HCT 16/10/9602 31.3* 35.0 - 47.0 % Final  . MCV 05/31/2016 91.8  80.0 - 100.0 fL Final  . MCH 05/31/2016 31.4  26.0 - 34.0 pg Final  . MCHC 05/31/2016 34.2  32.0 - 36.0 g/dL Final  . RDW 54/09/8117 14.5  11.5 - 14.5 % Final  . Platelets 05/31/2016 178  150 - 440 K/uL Final  . Neutrophils Relative % 05/31/2016 66  % Final  . Neutro Abs 05/31/2016 3.0  1.4 - 6.5 K/uL Final  . Lymphocytes Relative 05/31/2016 26  % Final  . Lymphs Abs 05/31/2016 1.2  1.0 - 3.6 K/uL Final  . Monocytes Relative 05/31/2016 6  % Final  . Monocytes Absolute 05/31/2016 0.3  0.2 - 0.9 K/uL Final  . Eosinophils Relative 05/31/2016 1  % Final  . Eosinophils Absolute 05/31/2016 0.1  0 - 0.7 K/uL Final  . Basophils Relative 05/31/2016 1  % Final  . Basophils Absolute 05/31/2016 0.1  0 - 0.1 K/uL Final  . Blood Bank Specimen 05/31/2016 SAMPLE AVAILABLE FOR TESTING   Final  . Sample  Expiration 05/31/2016 06/03/2016   Final  . Iron 05/31/2016 68  28 - 170 ug/dL Final  . TIBC 14/78/2956 278  250 - 450 ug/dL Final  . Saturation Ratios 05/31/2016 25  10.4 - 31.8 % Final  . UIBC 05/31/2016 210  ug/dL Final   Lab Results  Component Value Date   IRON 68 05/31/2016   TIBC 278 05/31/2016   IRONPCTSAT 25 05/31/2016   Lab Results  Component Value Date   FERRITIN 209 01/25/2016    STUDIES: No results found.  ASSESSMENT: Iron deficiency anemia.  PLAN:    1. Iron deficiency anemia: Patient has a history of intermittent dark stools, but reports a negative EGD, colonoscopy, capsule endoscopy completed in [place] in 2015. Patient had a repeat capsule endoscopy recently. We do not have these results, but patient reports that was "normal". Patient's hemoglobin is decreased, but stable. Her iron stores within normal limits. No intervention  is needed. Return to clinic in 3 month with repeat laboratory work and further evaluation.  2. Right leg pain: Resolved. Continue physical therapy and rehabilitation per orthopedics. 3. GI bleed: No obvious source identified.  Patient expressed understanding and was in agreement with this plan. She also understands that She can call clinic at any time with any questions, concerns, or complaints.    Jeralyn Ruths, MD 08/28/16 1:47 PM

## 2016-08-30 ENCOUNTER — Inpatient Hospital Stay: Payer: Medicare Other

## 2016-08-30 ENCOUNTER — Inpatient Hospital Stay: Payer: Medicare Other | Admitting: Oncology

## 2016-08-31 ENCOUNTER — Telehealth: Payer: Self-pay | Admitting: Oncology

## 2016-08-31 NOTE — Telephone Encounter (Signed)
Patient called to confirm appointment. Appointment reminder mailed to patient.

## 2016-09-06 ENCOUNTER — Other Ambulatory Visit: Payer: Self-pay

## 2016-09-06 ENCOUNTER — Ambulatory Visit (INDEPENDENT_AMBULATORY_CARE_PROVIDER_SITE_OTHER): Payer: Medicare Other | Admitting: Cardiovascular Disease

## 2016-09-06 ENCOUNTER — Encounter: Payer: Self-pay | Admitting: Cardiovascular Disease

## 2016-09-06 VITALS — BP 137/64 | HR 77 | Ht 65.0 in | Wt 111.5 lb

## 2016-09-06 DIAGNOSIS — I350 Nonrheumatic aortic (valve) stenosis: Secondary | ICD-10-CM

## 2016-09-06 DIAGNOSIS — I359 Nonrheumatic aortic valve disorder, unspecified: Secondary | ICD-10-CM

## 2016-09-06 NOTE — Patient Instructions (Signed)
Medication Instructions:  Your physician recommends that you continue on your current medications as directed. Please refer to the Current Medication list given to you today.   Labwork: none  Testing/Procedures: none  Follow-Up: Your physician recommends that you schedule a follow-up appointment after TAVR appointment.   Any Other Special Instructions Will Be Listed Below (If Applicable). We will call you with an appointment with the [facility] team [facility].      If you need a refill on your cardiac medications before your next appointment, please call your pharmacy.

## 2016-09-06 NOTE — Progress Notes (Signed)
Cardiology Office Note   Date:  09/06/2016   ID:  JAI BEAR, DOB [birth year], MRN [id number]  PCP:  Marguarite Arbour, MD  Cardiologist:   Lorine Bears, MD   Chief Complaint  Patient presents with  . other    F/u echo. Meds reviewed verbally with pt.      History of Present Illness: SAMAMTHA TIEGS is a 83 y.o. female who presents for  a follow-up visit regarding  aortic stenosis. A nuclear stress test in May 2015 showed no evidence of ischemia. The patient has known history of recurrent GI bleeding and iron deficiency anemia. This has stabilized recently. She had a mechanical fall in November and fractured right hip. She had surgery done.  She has chronic back pain and has underwent kyphoplasty in the past.  She also has known history of anxiety with mild memory problems.  She reports worsening exertional dyspnea without chest pain. She reports dizziness when her blood pressure is low.  She had a recent echocardiogram which showed worsening aortic stenosis with progression of mean gradient to 65 mmHg with a calculated valve area of 0.75. Ejection fraction was normal and pulmonary pressure was only mildly elevated.  Past Medical History:  Diagnosis Date  . Aortic stenosis    a. TTE 8/17: nl EF, mod to sev AS w/ mean gradient 28 mmHg, valve area 0.97, mod pulm HTN, small pericardial effusion  . Arrhythmia   . Asthma   . AVM (arteriovenous malformation)    a. small intestine   . Broken back   . Broken wrist   . Hip fracture (HCC)    a. in the setting of mechanical fall  . Iron deficiency anemia    a. requiring periodic transfusions  . Syncope and collapse     Past Surgical History:  Procedure Laterality Date  . ABDOMINAL HYSTERECTOMY    . APPENDECTOMY    . BACK SURGERY     back fusion  . ESOPHAGOGASTRODUODENOSCOPY (EGD) WITH PROPOFOL N/A 11/15/2015   Procedure: ESOPHAGOGASTRODUODENOSCOPY (EGD) WITH PROPOFOL;  Surgeon: [name], MD;  Location: [facility]  ENDOSCOPY;  Service: Endoscopy;  Laterality: N/A;  . FRACTURE SURGERY    . GIVENS CAPSULE STUDY N/A 02/20/2016   Procedure: GIVENS CAPSULE STUDY;  Surgeon: Scot Jun, MD;  Location[facility] ENDOSCOPY;  Service: Endoscopy;  Laterality: N/A;  . HIP FRACTURE SURGERY    . INTRAMEDULLARY (IM) NAIL INTERTROCHANTERIC Right 11/18/2015   Procedure: INTRAMEDULLARY (IM) NAIL INTERTROCHANTRIC;  Surgeon: Juanell Fairly, MD;  Location: [facility] ORS;  Service: Orthopedics;  Laterality: Right;  . KYPHOPLASTY N/A 08/19/2014   Procedure: KYPHOPLASTY;  Surgeon: Kennedy Bucker, MD;  Location: [facility] ORS;  Service: Orthopedics;  Laterality: N/A;  . PARTIAL HYSTERECTOMY    . TUBAL LIGATION       Current Outpatient Prescriptions  Medication Sig Dispense Refill  . acetaminophen (TYLENOL) 500 MG tablet Take 500 mg by mouth as needed.     . cholecalciferol (VITAMIN D) 400 units TABS tablet Take 400 Units by mouth daily.    . vitamin B-12 (CYANOCOBALAMIN) 1000 MCG tablet Take 1,000 mcg by mouth daily.     No current facility-administered medications for this visit.     Allergies:   Penicillins    Social History:  The patient  reports that she has never smoked. She has never used smokeless tobacco. She reports that she does not drink alcohol or use drugs.   Family History:  The patient's family history includes Breast  cancer in her maternal aunt; Heart disease in her sister.    ROS:  Please see the history of present illness.   Otherwise, review of systems are positive for none.   All other systems are reviewed and negative.    PHYSICAL EXAM: VS:  BP 137/64 (BP Location: Left Arm, Patient Position: Sitting, Cuff Size: Normal)   Pulse 77   Ht 5\' 5"  (1.651 m)   Wt 111 lb 8 oz (50.6 kg)   BMI 18.55 kg/m  , BMI Body mass index is 18.55 kg/m. GEN: Well nourished, well developed, in no acute distress  HEENT: normal  Neck: no JVD, or masses. Bilateral carotid bruits versus a radiated aortic murmur Cardiac: RRR;   rubs, or gallops,no edema . There is a 3/6 systolic murmur in the aortic area which is  late peaking with mildly diminished S2. The murmur radiates to the carotid arteries. Respiratory:  clear to auscultation bilaterally, normal work of breathing GI: soft, nontender, nondistended, + BS MS: no deformity or atrophy  Skin: warm and dry, no rash Neuro:  Strength and sensation are intact Psych: euthymic mood, full affect   EKG:  EKG is not ordered today.   Recent Labs: 11/17/2015: ALT 16 12/01/2015: BUN 30; Creatinine, Ser 0.93; Potassium 4.4; Sodium 142 05/31/2016: Hemoglobin 10.7; Platelets 178    Lipid Panel No results found for: CHOL, TRIG, HDL, CHOLHDL, VLDL, LDLCALC, LDLDIRECT    Wt Readings from Last 3 Encounters:  09/06/16 111 lb 8 oz (50.6 kg)  08/17/16 114 lb (51.7 kg)  05/31/16 114 lb 6.4 oz (51.9 kg)        ASSESSMENT AND PLAN:   1.  Severe symptomatic aortic stenosis:      She now reports worsening exertional dyspnea which is likely due to progression of aortic stenosis. I discussed management options with her. Given age and comorbidities, she is not a good candidate for surgical aortic valve replacement. She might be a reasonable candidate for TAVR and she is willing to discuss this option in more details although she has not decided on this. Her daughter and son were both present today during discussion. I explained to them the natural history of aortic stenosis. They are interested in referral to the valve clinic for further discussion of this. I made the referral today.  2. Severe blood loss anemia: Improved overall with most recent hemoglobin above 10.   Disposition:  Refer to TAVR clinic  Signed,  Lorine BearsMuhammad Arida, MD  09/06/2016 9:35 AM    [facility]

## 2016-09-10 ENCOUNTER — Telehealth: Payer: Self-pay

## 2016-09-10 NOTE — Telephone Encounter (Signed)
Left message on machine for pt to contact the office to discuss scheduling TAVR Consult. Please forward message back to me and I will contact the pt with appointment.

## 2016-09-11 ENCOUNTER — Other Ambulatory Visit: Payer: Self-pay | Admitting: Physical Medicine and Rehabilitation

## 2016-09-11 DIAGNOSIS — M5416 Radiculopathy, lumbar region: Secondary | ICD-10-CM

## 2016-09-17 ENCOUNTER — Telehealth: Payer: Self-pay | Admitting: Cardiovascular Disease

## 2016-09-17 NOTE — Telephone Encounter (Signed)
Left message on machine for pt to contact the office to discuss scheduling TAVR consult. Please forward message back to me and I will contact the pt with appointment.

## 2016-09-17 NOTE — Telephone Encounter (Signed)
I spoke with the pt's son and scheduled TAVR Consult on 10/01/16 with Dr Clifton James. Appointment card mailed to pt's home.

## 2016-09-17 NOTE — Telephone Encounter (Signed)
I spoke with the pt's son and scheduled TAVR Consult on 10/01/16 with Dr McAlhany. Appointment card mailed to pt's home.  

## 2016-09-17 NOTE — Telephone Encounter (Signed)
Pt son would like to know the status of pt appt with Dr. Excell Seltzer for valve replacement surgery. Please call and advise.

## 2016-09-18 ENCOUNTER — Ambulatory Visit
Admission: RE | Admit: 2016-09-18 | Discharge: 2016-09-18 | Disposition: A | Discharge status: Home / Self Care | Payer: Medicare Other | Source: Ambulatory Visit | Attending: Physical Medicine and Rehabilitation | Admitting: Physical Medicine and Rehabilitation

## 2016-09-18 DIAGNOSIS — M4854XA Collapsed vertebra, not elsewhere classified, thoracic region, initial encounter for fracture: Secondary | ICD-10-CM | POA: Insufficient documentation

## 2016-09-18 DIAGNOSIS — M5137 Other intervertebral disc degeneration, lumbosacral region: Secondary | ICD-10-CM | POA: Diagnosis not present

## 2016-09-18 DIAGNOSIS — M5416 Radiculopathy, lumbar region: Secondary | ICD-10-CM | POA: Diagnosis present

## 2016-09-18 DIAGNOSIS — M47896 Other spondylosis, lumbar region: Secondary | ICD-10-CM | POA: Diagnosis not present

## 2016-09-18 NOTE — Progress Notes (Signed)
[facility]  Telephone:(336) [phone]  Fax:(336) [phone]     ZAKYIA GAGAN DOB: July 01, 1930  MR#: [id number]  AOZ#:[id number]  Patient Care Team: Marguarite Arbour, MD as PCP - General (Internal Medicine)  Chief complaint: Iron deficiency anemia secondary to GI blood loss.  INTERVAL HISTORY: Patient returns to clinic today for further evaluation and laboratory work. She does not complain of weakness or fatigue today. She currently feels well and is asymptomatic. He has no neurologic complaints. She denies any recent fevers or illnesses. She has no chest pain, shortness of breath, or cough.  She denies nausea, vomiting, constipation, or diarrhea. She denies any recent melena or hematochezia. She has no urinary complaints. Patient offers no specific complaints today.   REVIEW OF SYSTEMS:   Review of Systems  Constitutional: Negative for chills, fever and malaise/fatigue.  Respiratory: Negative.  Negative for cough, hemoptysis and shortness of breath.   Cardiovascular: Negative.  Negative for chest pain, palpitations and leg swelling.  Gastrointestinal: Negative for blood in stool, constipation, diarrhea, melena, nausea and vomiting.  Genitourinary: Negative.  Negative for hematuria.  Musculoskeletal: Positive for joint pain.  Skin: Negative.  Negative for rash.  Neurological: Negative.  Negative for dizziness, weakness and headaches.  Endo/Heme/Allergies: Negative.   Psychiatric/Behavioral: Negative.  The patient is not nervous/anxious.     As per HPI. Otherwise, a complete review of systems is negative.  ONCOLOGY HISTORY:  No history exists.    PAST MEDICAL HISTORY: Past Medical History:  Diagnosis Date  . Aortic stenosis    a. TTE 8/17: nl EF, mod to sev AS w/ mean gradient 28 mmHg, valve area 0.97, mod pulm HTN, small pericardial effusion  . Arrhythmia   . Asthma   . AVM (arteriovenous malformation)    a. small intestine   . Broken back   . Broken wrist     . Hip fracture (HCC)    a. in the setting of mechanical fall  . Iron deficiency anemia    a. requiring periodic transfusions  . Syncope and collapse     PAST SURGICAL HISTORY: Past Surgical History:  Procedure Laterality Date  . ABDOMINAL HYSTERECTOMY    . APPENDECTOMY    . BACK SURGERY     back fusion  . ESOPHAGOGASTRODUODENOSCOPY (EGD) WITH PROPOFOL N/A 11/15/2015   Procedure: ESOPHAGOGASTRODUODENOSCOPY (EGD) WITH PROPOFOL;  Surgeon: [name], MD;  Location: [facility] ENDOSCOPY;  Service: Endoscopy;  Laterality: N/A;  . FRACTURE SURGERY    . GIVENS CAPSULE STUDY N/A 02/20/2016   Procedure: GIVENS CAPSULE STUDY;  Surgeon: Scot Jun, MD;  Location: [facility] ENDOSCOPY;  Service: Endoscopy;  Laterality: N/A;  . HIP FRACTURE SURGERY    . INTRAMEDULLARY (IM) NAIL INTERTROCHANTERIC Right 11/18/2015   Procedure: INTRAMEDULLARY (IM) NAIL INTERTROCHANTRIC;  Surgeon: Juanell Fairly, MD;  Location: [facility] ORS;  Service: Orthopedics;  Laterality: Right;  . KYPHOPLASTY N/A 08/19/2014   Procedure: KYPHOPLASTY;  Surgeon: Kennedy Bucker, MD;  Location: [facility] ORS;  Service: Orthopedics;  Laterality: N/A;  . PARTIAL HYSTERECTOMY    . TUBAL LIGATION      FAMILY HISTORY Family History  Problem Relation Age of Onset  . Heart disease Sister   . Breast cancer Maternal Aunt     GYNECOLOGIC HISTORY:  No LMP recorded. Patient is postmenopausal.     ADVANCED DIRECTIVES:    HEALTH MAINTENANCE: Social History  Substance Use Topics  . Smoking status: Never Smoker  . Smokeless tobacco: Never Used  . Alcohol use No  Allergies  Allergen Reactions  . Penicillins     antibiotics    Current Outpatient Prescriptions  Medication Sig Dispense Refill  . acetaminophen (TYLENOL) 500 MG tablet Take 500 mg by mouth as needed.     . cholecalciferol (VITAMIN D) 400 units TABS tablet Take 400 Units by mouth daily.    . vitamin B-12 (CYANOCOBALAMIN) 1000 MCG tablet Take 1,000 mcg by mouth daily.     No  current facility-administered medications for this visit.     OBJECTIVE: BP (!) 146/70 (BP Location: Left Arm, Patient Position: Sitting)   Pulse 71   Temp 98.1 F (36.7 C) (Tympanic)   Resp 18   Wt 114 lb (51.7 kg)   BMI 18.97 kg/m    Body mass index is 18.97 kg/m.    ECOG FS:1 - Symptomatic but completely ambulatory  General: Well-developed, well-nourished, no acute distress. Eyes: Pink conjunctiva, anicteric sclera. HEENT: Normocephalic, moist mucous membranes, clear oropharnyx. Lungs: Clear to auscultation bilaterally. Heart: Regular rate and rhythm. Positive for murmur Musculoskeletal: Non-pitting edema in bilateral ankles. No cyanosis or clubbing. Neuro: Alert, answering all questions appropriately.  Skin: No rashes or petechiae noted. Psych: Normal affect.   LAB RESULTS:  Appointment on 09/19/2016  Component Date Value Ref Range Status  . WBC 09/19/2016 4.9  3.6 - 11.0 K/uL Final  . RBC 09/19/2016 3.44* 3.80 - 5.20 MIL/uL Final  . Hemoglobin 09/19/2016 10.9* 12.0 - 16.0 g/dL Final  . HCT 40/98/1191 31.9* 35.0 - 47.0 % Final  . MCV 09/19/2016 92.7  80.0 - 100.0 fL Final  . MCH 09/19/2016 31.8  26.0 - 34.0 pg Final  . MCHC 09/19/2016 34.4  32.0 - 36.0 g/dL Final  . RDW 47/82/9562 13.9  11.5 - 14.5 % Final  . Platelets 09/19/2016 156  150 - 440 K/uL Final  . Neutrophils Relative % 09/19/2016 69  % Final  . Neutro Abs 09/19/2016 3.4  1.4 - 6.5 K/uL Final  . Lymphocytes Relative 09/19/2016 23  % Final  . Lymphs Abs 09/19/2016 1.1  1.0 - 3.6 K/uL Final  . Monocytes Relative 09/19/2016 7  % Final  . Monocytes Absolute 09/19/2016 0.4  0.2 - 0.9 K/uL Final  . Eosinophils Relative 09/19/2016 1  % Final  . Eosinophils Absolute 09/19/2016 0.1  0 - 0.7 K/uL Final  . Basophils Relative 09/19/2016 0  % Final  . Basophils Absolute 09/19/2016 0.0  0 - 0.1 K/uL Final  . Blood Bank Specimen 09/19/2016 SAMPLE AVAILABLE FOR TESTING   Final  . Sample Expiration 09/19/2016 09/22/2016    Final   Lab Results  Component Value Date   IRON 68 05/31/2016   TIBC 278 05/31/2016   IRONPCTSAT 25 05/31/2016   Lab Results  Component Value Date   FERRITIN 209 01/25/2016    STUDIES: No results found.  ASSESSMENT: Iron deficiency anemia.  PLAN:    1. Iron deficiency anemia: Patient has a history of intermittent dark stools, but reports a negative EGD, colonoscopy, capsule endoscopy completed in [place] in 2015. Patient had a repeat capsule endoscopy more recently that was also reported as negative. Patient's hemoglobin is decreased, but remained stable and unchanged. Her iron stores continue to be within normal limits. No intervention is needed. Return to clinic in 4 month with repeat laboratory work and further evaluation.  2. Right leg pain: Resolved. Continue physical therapy and rehabilitation per orthopedics. 3. GI bleed: No obvious source identified.  Approximately 20 minutes was spent in discussion of which  greater than 50% was consultation.  Patient expressed understanding and was in agreement with this plan. She also understands that She can call clinic at any time with any questions, concerns, or complaints.    Jeralyn Ruths, MD 09/24/16 2:55 PM

## 2016-09-19 ENCOUNTER — Inpatient Hospital Stay: Payer: Medicare Other | Attending: Oncology

## 2016-09-19 ENCOUNTER — Inpatient Hospital Stay (HOSPITAL_BASED_OUTPATIENT_CLINIC_OR_DEPARTMENT_OTHER): Payer: Medicare Other | Admitting: Oncology

## 2016-09-19 ENCOUNTER — Inpatient Hospital Stay: Payer: Medicare Other

## 2016-09-19 VITALS — BP 146/70 | HR 71 | Temp 98.1°F | Resp 18 | Wt 114.0 lb

## 2016-09-19 DIAGNOSIS — K922 Gastrointestinal hemorrhage, unspecified: Secondary | ICD-10-CM | POA: Insufficient documentation

## 2016-09-19 DIAGNOSIS — Z803 Family history of malignant neoplasm of breast: Secondary | ICD-10-CM | POA: Insufficient documentation

## 2016-09-19 DIAGNOSIS — R55 Syncope and collapse: Secondary | ICD-10-CM

## 2016-09-19 DIAGNOSIS — D500 Iron deficiency anemia secondary to blood loss (chronic): Secondary | ICD-10-CM | POA: Diagnosis present

## 2016-09-19 DIAGNOSIS — I350 Nonrheumatic aortic (valve) stenosis: Secondary | ICD-10-CM | POA: Diagnosis not present

## 2016-09-19 DIAGNOSIS — Z8781 Personal history of (healed) traumatic fracture: Secondary | ICD-10-CM

## 2016-09-19 DIAGNOSIS — J45909 Unspecified asthma, uncomplicated: Secondary | ICD-10-CM | POA: Diagnosis not present

## 2016-09-19 DIAGNOSIS — Z79899 Other long term (current) drug therapy: Secondary | ICD-10-CM

## 2016-09-19 DIAGNOSIS — K5520 Angiodysplasia of colon without hemorrhage: Secondary | ICD-10-CM

## 2016-09-19 DIAGNOSIS — I499 Cardiac arrhythmia, unspecified: Secondary | ICD-10-CM | POA: Diagnosis not present

## 2016-09-19 LAB — CBC WITH DIFFERENTIAL/PLATELET
BASOS PCT: 0 %
Basophils Absolute: 0 10*3/uL (ref 0–0.1)
Eosinophils Absolute: 0.1 10*3/uL (ref 0–0.7)
Eosinophils Relative: 1 %
HCT: 31.9 % — ABNORMAL LOW (ref 35.0–47.0)
Hemoglobin: 10.9 g/dL — ABNORMAL LOW (ref 12.0–16.0)
LYMPHS ABS: 1.1 10*3/uL (ref 1.0–3.6)
Lymphocytes Relative: 23 %
MCH: 31.8 pg (ref 26.0–34.0)
MCHC: 34.4 g/dL (ref 32.0–36.0)
MCV: 92.7 fL (ref 80.0–100.0)
MONO ABS: 0.4 10*3/uL (ref 0.2–0.9)
MONOS PCT: 7 %
NEUTROS ABS: 3.4 10*3/uL (ref 1.4–6.5)
Neutrophils Relative %: 69 %
Platelets: 156 10*3/uL (ref 150–440)
RBC: 3.44 MIL/uL — ABNORMAL LOW (ref 3.80–5.20)
RDW: 13.9 % (ref 11.5–14.5)
WBC: 4.9 10*3/uL (ref 3.6–11.0)

## 2016-09-19 LAB — SAMPLE TO BLOOD BANK

## 2016-09-28 ENCOUNTER — Emergency Department: Payer: Medicare Other

## 2016-09-28 ENCOUNTER — Other Ambulatory Visit: Payer: Self-pay

## 2016-09-28 ENCOUNTER — Encounter: Payer: Self-pay | Admitting: Emergency Medicine

## 2016-09-28 ENCOUNTER — Emergency Department
Admission: EM | Admit: 2016-09-28 | Discharge: 2016-09-29 | Disposition: A | Discharge status: Home / Self Care | Payer: Medicare Other | Attending: Emergency Medicine | Admitting: Emergency Medicine

## 2016-09-28 DIAGNOSIS — H8149 Vertigo of central origin, unspecified ear: Secondary | ICD-10-CM | POA: Diagnosis not present

## 2016-09-28 DIAGNOSIS — R42 Dizziness and giddiness: Secondary | ICD-10-CM | POA: Diagnosis present

## 2016-09-28 DIAGNOSIS — J45909 Unspecified asthma, uncomplicated: Secondary | ICD-10-CM | POA: Diagnosis not present

## 2016-09-28 DIAGNOSIS — R51 Headache: Secondary | ICD-10-CM | POA: Insufficient documentation

## 2016-09-28 DIAGNOSIS — R519 Headache, unspecified: Secondary | ICD-10-CM

## 2016-09-28 LAB — CBC
HCT: 35 % (ref 35.0–47.0)
Hemoglobin: 11.9 g/dL — ABNORMAL LOW (ref 12.0–16.0)
MCH: 31.7 pg (ref 26.0–34.0)
MCHC: 33.9 g/dL (ref 32.0–36.0)
MCV: 93.5 fL (ref 80.0–100.0)
PLATELETS: 173 10*3/uL (ref 150–440)
RBC: 3.74 MIL/uL — AB (ref 3.80–5.20)
RDW: 14.3 % (ref 11.5–14.5)
WBC: 4.6 10*3/uL (ref 3.6–11.0)

## 2016-09-28 LAB — COMPREHENSIVE METABOLIC PANEL
ALT: 15 U/L (ref 14–54)
AST: 28 U/L (ref 15–41)
Albumin: 4.5 g/dL (ref 3.5–5.0)
Alkaline Phosphatase: 73 U/L (ref 38–126)
Anion gap: 9 (ref 5–15)
BUN: 24 mg/dL — AB (ref 6–20)
CHLORIDE: 106 mmol/L (ref 101–111)
CO2: 24 mmol/L (ref 22–32)
CREATININE: 0.83 mg/dL (ref 0.44–1.00)
Calcium: 9.5 mg/dL (ref 8.9–10.3)
GFR calc non Af Amer: >60 mL/min (ref >60)
Glucose, Bld: 98 mg/dL (ref 65–99)
Potassium: 3.7 mmol/L (ref 3.5–5.1)
SODIUM: 139 mmol/L (ref 135–145)
Total Bilirubin: 0.9 mg/dL (ref 0.3–1.2)
Total Protein: 7.5 g/dL (ref 6.5–8.1)

## 2016-09-28 LAB — URINALYSIS, COMPLETE (UACMP) WITH MICROSCOPIC
Bilirubin Urine: NEGATIVE
Glucose, UA: NEGATIVE mg/dL
Hgb urine dipstick: NEGATIVE
KETONES UR: NEGATIVE mg/dL
LEUKOCYTES UA: NEGATIVE
Nitrite: NEGATIVE
Protein, ur: NEGATIVE mg/dL
Specific Gravity, Urine: 1.002 — ABNORMAL LOW (ref 1.005–1.030)
pH: 7 (ref 5.0–8.0)

## 2016-09-28 LAB — TROPONIN I

## 2016-09-28 MED ORDER — SODIUM CHLORIDE 0.9 % IV BOLUS (SEPSIS)
1000.0000 mL | Freq: Once | INTRAVENOUS | Status: AC
  Administered 2016-09-28: 1000 mL via INTRAVENOUS

## 2016-09-28 MED ORDER — MECLIZINE HCL 25 MG PO TABS
25.0000 mg | ORAL_TABLET | Freq: Once | ORAL | Status: AC
  Administered 2016-09-28: 25 mg via ORAL
  Filled 2016-09-28: qty 1

## 2016-09-28 MED ORDER — IOPAMIDOL (ISOVUE-370) INJECTION 76%
75.0000 mL | Freq: Once | INTRAVENOUS | Status: AC | PRN
  Administered 2016-09-29: 75 mL via INTRAVENOUS

## 2016-09-28 MED ORDER — METOPROLOL TARTRATE 5 MG/5ML IV SOLN
5.0000 mg | Freq: Once | INTRAVENOUS | Status: AC
  Administered 2016-09-28: 5 mg via INTRAVENOUS
  Filled 2016-09-28: qty 5

## 2016-09-28 MED ORDER — SODIUM CHLORIDE 0.9 % IV BOLUS (SEPSIS): 500.0000 mL | Freq: Once | INTRAVENOUS | Status: DC

## 2016-09-28 NOTE — ED Notes (Signed)
Pt wheeled to bathroom by daughter. Able to transfer self from car to toilet well.

## 2016-09-28 NOTE — ED Notes (Signed)
Pt taken to CT via stretcher.

## 2016-09-28 NOTE — ED Notes (Signed)
Pt came back from CT with this RN, upset and agitated. Pt taken to toilet to urinate. Updated charge Rn about need for IV for CT angio.

## 2016-09-28 NOTE — ED Notes (Signed)
Barbette Hair attempted [place] IV.

## 2016-09-28 NOTE — ED Triage Notes (Signed)
Pt in with co severe headache x 2 days, with co dizziness. Pt is pending heart valve replacement, has apptm Monday for scheduling.  Pt with no co chest pain, does have shob but none noted at this time.

## 2016-09-28 NOTE — ED Provider Notes (Signed)
[facility] Emergency Department Provider Note  ____________________________________________  Time seen: Approximately 8:51 PM  I have reviewed the triage vital signs and the nursing notes.   HISTORY  Chief Complaint Dizziness    HPI Kelsey Le is a 83 y.o. female history of severe aortic stenosis, not anticoagulated,chronic anemia, presenting with headache, dizziness. The patient reports that 2 nights ago at 9 PM she was getting ready for bed when she had an acute onset of a headache that was severe, described as "like everything was going to explode." This lasted for 30 seconds, and was followed with dizziness "like I am spinning around," and generalized weakness which lasted for approximately one hour. The patient took a Tylenol which resolved her symptoms completely. She then has had 3 further episodes since then. She feels that her symptoms are worse when she looks upwards, and that an upward gaze can trigger the headache. The patient denies any recent changes in her medications, difficulty with eating or drinking, nausea vomiting or diarrhea, fevers or chills. She has not had anyvisual changes, speech changes, mental status changes, lightheadedness or syncope. She has not had any numbness tingling or weakness. No gait instability. She has never experienced anything like this before. No trauma. No tick bites. The patient has chronic allergies which are unchanged.  Of note, the patient is scheduled for evaluation for aortic valve repair consultation in 4 days.  Strong family history of vertigo.   Past Medical History:  Diagnosis Date  . Aortic stenosis    a. TTE 8/17: nl EF, mod to sev AS w/ mean gradient 28 mmHg, valve area 0.97, mod pulm HTN, small pericardial effusion  . Arrhythmia   . Asthma   . AVM (arteriovenous malformation)    a. small intestine   . Broken back   . Broken wrist   . Hip fracture (HCC)    a. in the setting of mechanical fall  .  Iron deficiency anemia    a. requiring periodic transfusions  . Syncope and collapse     Patient Active Problem List   Diagnosis Date Noted  . Iron deficiency anemia 01/15/2016  . Hip fracture (HCC) 11/17/2015  . Acute posthemorrhagic anemia 11/16/2015  . H/O transfusion of packed red blood cells 11/16/2015  . Elevated troponin 11/16/2015  . Severe aortic stenosis 11/16/2015  . Thrombocytopenia (HCC) 11/16/2015  . Generalized weakness 11/16/2015  . Melena 11/14/2015  . Lichen sclerosus 10/31/2015  . Constipation 10/24/2015  . DDD (degenerative disc disease), lumbar 11/02/2014  . Mild intermittent asthma without complication 03/05/2014  . GI bleed 03/01/2014  . Lumbar radiculitis 12/16/2013  . Aortic stenosis 07/27/2013  . Osteoarthritis of hip 07/06/2013  . Trochanteric bursitis 07/06/2013  . Heart murmur 04/26/2013  . SOB (shortness of breath) 04/26/2013  . Elevated blood pressure reading without diagnosis of hypertension 04/26/2013  . Left wrist fracture 12/05/2008    Past Surgical History:  Procedure Laterality Date  . ABDOMINAL HYSTERECTOMY    . APPENDECTOMY    . BACK SURGERY     back fusion  . ESOPHAGOGASTRODUODENOSCOPY (EGD) WITH PROPOFOL N/A 11/15/2015   Procedure: ESOPHAGOGASTRODUODENOSCOPY (EGD) WITH PROPOFOL;  Surgeon: [name], MD;  Location: [facility] ENDOSCOPY;  Service: Endoscopy;  Laterality: N/A;  . FRACTURE SURGERY    . GIVENS CAPSULE STUDY N/A 02/20/2016   Procedure: GIVENS CAPSULE STUDY;  Surgeon: Scot Jun, MD;  Location: [facility] ENDOSCOPY;  Service: Endoscopy;  Laterality: N/A;  . HIP FRACTURE SURGERY    .  INTRAMEDULLARY (IM) NAIL INTERTROCHANTERIC Right 11/18/2015   Procedure: INTRAMEDULLARY (IM) NAIL INTERTROCHANTRIC;  Surgeon: Juanell Fairly, MD;  Location: [facility] ORS;  Service: Orthopedics;  Laterality: Right;  . KYPHOPLASTY N/A 08/19/2014   Procedure: KYPHOPLASTY;  Surgeon: Kennedy Bucker, MD;  Location: [facility] ORS;  Service: Orthopedics;   Laterality: N/A;  . PARTIAL HYSTERECTOMY    . TUBAL LIGATION      Current Outpatient Rx  . Order #: [id number] Class: Historical Med  . Order #: [id number] Class: Historical Med  . Order #: [id number] Class: Print  . Order #: [id number] Class: Historical Med    Allergies Penicillins  Family History  Problem Relation Age of Onset  . Heart disease Sister   . Breast cancer Maternal Aunt     Social History Social History  Substance Use Topics  . Smoking status: Never Smoker  . Smokeless tobacco: Never Used  . Alcohol use No    Review of Systems Constitutional: No fever/chills.positive dizziness. Negative lightheadedness or syncope.positive generalized weakness. Eyes: No visual changes.no blurred or double vision. ENT: No sore throat. No congestion or rhinorrhea. Cardiovascular: Denies chest pain. Denies palpitations. Respiratory: Denies shortness of breath.  No cough. Gastrointestinal: No abdominal pain.  No nausea, no vomiting.  No diarrhea.  No constipation. Genitourinary: Negative for dysuria. Musculoskeletal: Negative for back pain. Skin: Negative for rash. Neurological: positive for headache. No focal numbness, tingling or weakness. No gait instability.    ____________________________________________   PHYSICAL EXAM:  VITAL SIGNS: ED Triage Vitals  Enc Vitals Group     BP 09/28/16 1910 (!) 213/84     Pulse --      Resp 09/28/16 1910 20     Temp 09/28/16 1910 98.6 F (37 C)     Temp Source 09/28/16 1910 Oral     SpO2 09/28/16 1910 99 %     Weight 09/28/16 1911 112 lb (50.8 kg)     Height 09/28/16 1911  (1.651 m)     Head Circumference --      Peak Flow --      Pain Score 09/28/16 1910 0     Pain Loc --      Pain Edu? --      Excl. in GC? --     Constitutional: Alert and oriented. Well appearing and in no acute distress. Answers questions appropriately. Eyes: Conjunctivae are normal.  EOMI. No scleral icterus. Head: Atraumatic. No tenderness to  palpation over the temples. Nose: No congestion/rhinnorhea. Mouth/Throat: Mucous membranes are moist.  Neck: No stridor.  Supple.  No JVD. No meningismus. Cardiovascular: Normal rate, regular rhythm. No murmurs, rubs or gallops.  Respiratory: Normal respiratory effort.  No accessory muscle use or retractions. Lungs CTAB.  No wheezes, rales or ronchi. Gastrointestinal: Soft, nontender and nondistended.  No guarding or rebound.  No peritoneal signs. Musculoskeletal: No LE edema. No ttp in the calves or palpable cords.  Negative Homan's sign. Neurologic: Alert and oriented 3. Speech is clear. Face and smile symmetric. Tongue is midline. . No pronator drift. 5 out of 5 grip, biceps, triceps, hip flexors, plantar flexion and dorsiflexion. Normal sensation to light touch in the bilateral upper and lower extremities, and face. Normal FNF. Skin:  Skin is warm, dry and intact. No rash noted. Psychiatric: Mood and affect are normal. Speech and behavior are normal.  Normal judgement.  ____________________________________________   LABS (all labs ordered are listed, but only abnormal results are displayed)  Labs Reviewed  CBC - Abnormal; Notable for the following:  Result Value   RBC 3.74 (*)    Hemoglobin 11.9 (*)    All other components within normal limits  COMPREHENSIVE METABOLIC PANEL - Abnormal; Notable for the following:    BUN 24 (*)    All other components within normal limits  URINALYSIS, COMPLETE (UACMP) WITH MICROSCOPIC - Abnormal; Notable for the following:    Color, Urine COLORLESS (*)    APPearance CLEAR (*)    Specific Gravity, Urine 1.002 (*)    Bacteria, UA RARE (*)    Squamous Epithelial / LPF 0-5 (*)    All other components within normal limits  TROPONIN I   ____________________________________________  EKG  ED ECG REPORT I, Rockne Menghini, the attending physician, personally viewed and interpreted this ECG.   Date: 09/28/2016  EKG Time: 1918  Rate:  70  Rhythm: normal sinus rhythm  Axis: normal  Intervals:none  ST&T Change: no STEMI  ____________________________________________  RADIOLOGY  Ct Head Wo Contrast  Result Date: 09/28/2016 CLINICAL DATA:  83 year old female with severe headache for 2 days, dizziness. EXAM: CT HEAD WITHOUT CONTRAST TECHNIQUE: Contiguous axial images were obtained from the base of the skull through the vertex without intravenous contrast. COMPARISON:  Brain MRI 11/12/2014 and earlier. FINDINGS: Brain: Stable cerebral volume since 2014. No midline shift, ventriculomegaly, mass effect, evidence of mass lesion, intracranial hemorrhage or evidence of cortically based acute infarction. Gray-white matter differentiation is within normal limits throughout the brain. Vascular: No suspicious intracranial vascular hyperdensity. Skull: Osteopenia.  No acute osseous abnormality identified. Sinuses/Orbits: Visualized paranasal sinuses and mastoids are stable and well pneumatized. Other: Visualized orbits and scalp soft tissues are within normal limits. IMPRESSION: Normal for age non contrast CT appearance of the brain. Electronically Signed   By: Odessa Fleming M.D.   On: 09/28/2016 19:37    ____________________________________________   PROCEDURES  Procedure(s) performed: None  Procedures  Critical Care performed: No ____________________________________________   INITIAL IMPRESSION / ASSESSMENT AND PLAN / ED COURSE  Pertinent labs & imaging results that were available during my care of the patient were reviewed by me and considered in my medical decision making (see chart for details).  83 y.o. Female with history of aortic stenosis, not anticoagulated, presenting with 4 distinct 30 second headaches that were severe, and followed by dizziness, worse with looking up. Overall, the patient is hypertensive on arrival and without intervention this has improved but not resolved. There are multiple possible etiologies for the  patient's symptoms including vertigo.  Posterior stroke is also possible, so we will supplement her noncontrasted CT with a CT angiogram of the head and neck. She does have aortic stenosis without anticoagulation, so stroke is possible.  She may also be having symptomatic aortic stenosis with some hypoxemia. The patient does not have any infectious symptoms other than chronic allergies, so sinusitis is much less likely. The patient is not having visual changes and does not have pain over the temples so temporal arteritis is unlikely.I'll plan to treat her with meclizine for suspected vertigo, the most likely cause of the patient's symptoms. We will also get a CT angiogram, orthostatics, and reevaluate the patient for final disposition. Reviewed the patient's medical record to assess her risk factors.  ----------------------------------------- 9:00 PM on 09/28/2016 -----------------------------------------  The patient does have a blood pressure which decreases during orthostatic vital signs although her heart rate remains the same.  She continues to have hypertension, so I will treat her with metoprolol. Plan re-eval for final disposition.   ----------------------------------------- 12:16  AM on 09/29/2016 -----------------------------------------  The patient has not had any further headaches here and she has an related to the bathroom without any difficulty or instability. I'm still awaiting the CT angiogram, and have sent the patient out to Dr. Dolores Frame, who will follow up her imaging for final disposition.  ____________________________________________  FINAL CLINICAL IMPRESSION(S) / ED DIAGNOSES  Final diagnoses:  Vertigo  Nonintractable headache, unspecified chronicity pattern, unspecified headache type    Clinical Course as of Sep 30 14  Fri Sep 28, 2016  2033 Glucose: 98 [PM]    Clinical Course User Index [PM] Arnaldo Natal, MD      NEW MEDICATIONS STARTED DURING THIS  VISIT:  New Prescriptions   MECLIZINE (ANTIVERT) 25 MG TABLET    Take 1 tablet (25 mg total) by mouth 3 (three) times daily as needed for dizziness.      Rockne Menghini, MD 09/29/16 [phone]

## 2016-09-29 ENCOUNTER — Emergency Department: Payer: Medicare Other

## 2016-09-29 DIAGNOSIS — H8149 Vertigo of central origin, unspecified ear: Secondary | ICD-10-CM | POA: Diagnosis not present

## 2016-09-29 MED ORDER — MECLIZINE HCL 25 MG PO TABS: 25.0000 mg | ORAL_TABLET | Freq: Three times a day (TID) | ORAL | 0 refills | Status: DC | PRN

## 2016-09-29 MED ORDER — ACETAMINOPHEN 325 MG PO TABS
650.0000 mg | ORAL_TABLET | Freq: Once | ORAL | Status: AC
  Administered 2016-09-29: 650 mg via ORAL
  Filled 2016-09-29: qty 2

## 2016-09-29 MED ORDER — MECLIZINE HCL 25 MG PO TABS
25.0000 mg | ORAL_TABLET | Freq: Once | ORAL | Status: AC
  Administered 2016-09-29: 25 mg via ORAL
  Filled 2016-09-29: qty 1

## 2016-09-29 NOTE — ED Notes (Signed)
ED Provider at bedside. 

## 2016-09-29 NOTE — ED Provider Notes (Signed)
-----------------------------------------   2:21 AM on 09/29/2016 -----------------------------------------  CTA head and neck interpreted per Dr. Harrie Jeans:  1. Patent carotid and vertebral arteries. No dissection, aneurysm,  or hemodynamically significant stenosis utilizing NASCET criteria.  2. Patent circle of Willis. No large vessel occlusion or significant  stenosis. No vascular malformation.  3. 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  4. 10 mm nodular lesion within the left vallecula which may  represent a polyp or tongue base carcinoma. Direct visualization is  recommended.   Updated patient and family members of CTA results. If patient requires transfer tonight, they prefer [facility]. Will discuss with [facility] neurosurgery to discuss necessity for transfer tonight for evaluation versus outpatient follow-up. Have also updated them of nodular irregularity found within the left vallecula which will require outpatient ENT follow-up.  ----------------------------------------- 2:50 AM on 09/29/2016 -----------------------------------------  Discussed with Dr. Ephriam Jenkins [facility] Neurosurgery) who feels patient's symptoms are not directly related to her small unruptured aneurysm. Does not recommend transfer for evaluation tonight. I have discussed this at length with patient and her family. They prefer outpatient follow-up in [place] as the daughter lives there. Will have patient follow-up with [facility] as well as [facility]. Will discharge home with meclizine prescription per previous provider. Strict return precautions given. All verbalize understanding and agree with plan of care.   Irean Hong, MD 09/29/16 [phone]

## 2016-09-29 NOTE — ED Notes (Signed)

## 2016-09-29 NOTE — Discharge Instructions (Addendum)
Please drink plenty of fluid to stay well-hydrated. Please moves slowly between positions to prevent lightheadedness, dizziness.  Return to the emergency department if you develop severe pain, changes in vision or speech, changes in ental status, numbness tingling or weakness, difficulty walking, or any other symptoms concerning to you.

## 2016-09-29 NOTE — ED Notes (Signed)
Patient transported to CT 

## 2016-09-29 NOTE — ED Notes (Addendum)
Signature pad not available for ED Discharge signature, pt's son signed ED discharge hard copy (per consent from pt) and placed on chart.

## 2016-10-01 ENCOUNTER — Ambulatory Visit (INDEPENDENT_AMBULATORY_CARE_PROVIDER_SITE_OTHER): Payer: Medicare Other | Admitting: Cardiovascular Disease

## 2016-10-01 ENCOUNTER — Encounter: Payer: Self-pay | Admitting: Cardiovascular Disease

## 2016-10-01 VITALS — BP 138/60 | HR 84 | Ht 65.0 in | Wt 113.8 lb

## 2016-10-01 DIAGNOSIS — I350 Nonrheumatic aortic (valve) stenosis: Secondary | ICD-10-CM

## 2016-10-01 NOTE — Progress Notes (Signed)
 Multi-Disciplinary Valve Clinic Note  Referring Physician: Arida  Chief Complaint  Patient presents with  . New Patient (Initial Visit)    Severe AS,TAVR discussion      History of Present Illness: 83 yo female with history of iron deficiency anemia, prior GI bleeding due to AVMs, asthma and severe aortic valve stenosis who is here today as a new consult at the request of Dr. Arida for discussion regarding her aortic valve stenosis and possible TAVR. She has prior GI bleeding due to AVMs. This has been stable recently. She has chronic back pain. She has anxiety. She has been followed for years for aortic stenosis. Echo 08/28/16 with normal LV systolic function, LVEF=60-65%. The aortic valve stenosis has progressed. Mean gradient 65 mmHg, peak gradient 106 mmHg, AVA 0.75 cm2. No mitral valve disease. She was seen in the ED at [facility] on 09/28/16 with c/o headache and dizziness. CTA head in th ED showed a small (2mm) IC aneurysm, mild carotid disease and a 10 mm nodular lesion in the left vallecula 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  She has plans to see Neurosurgery and ENT here in [place].   She is retired from a dry cleaning store. She is limited in mobility due to chronic back pain and following her hip fracture last year. She is here today with her son and daughter. No LE edema or pain in legs. She describes worsened dyspnea with exertion. She has no chest pain. She has occasional dizziness with no syncope.   Primary Care Physician: Sparks, Jeffrey D, MD Primary Cardiologist: Arida   Past Medical History:  Diagnosis Date  . Aortic stenosis    a. TTE 8/17: nl EF, mod to sev AS w/ mean gradient 28 mmHg, valve area 0.97, mod pulm HTN, small pericardial effusion  . Arrhythmia   . Asthma   . AVM (arteriovenous malformation)    a. small intestine   . Broken back   . Broken wrist   . Hip fracture (HCC)    a. in the setting of mechanical fall  . Iron deficiency anemia      a. requiring periodic transfusions  . Syncope and collapse     Past Surgical History:  Procedure Laterality Date  . APPENDECTOMY    . BACK SURGERY     back fusion  . ESOPHAGOGASTRODUODENOSCOPY (EGD) WITH PROPOFOL N/A 11/15/2015   Procedure: ESOPHAGOGASTRODUODENOSCOPY (EGD) WITH PROPOFOL;  Surgeon: Darren Wohl, MD;  Location: [facility] ENDOSCOPY;  Service: Endoscopy;  Laterality: N/A;  . FRACTURE SURGERY    . GIVENS CAPSULE STUDY N/A 02/20/2016   Procedure: GIVENS CAPSULE STUDY;  Surgeon: Robert T Elliott, MD;  Location: [facility] ENDOSCOPY;  Service: Endoscopy;  Laterality: N/A;  . HIP FRACTURE SURGERY    . INTRAMEDULLARY (IM) NAIL INTERTROCHANTERIC Right 11/18/2015   Procedure: INTRAMEDULLARY (IM) NAIL INTERTROCHANTRIC;  Surgeon: Kevin Krasinski, MD;  Location: [facility] ORS;  Service: Orthopedics;  Laterality: Right;  . KYPHOPLASTY N/A 08/19/2014   Procedure: KYPHOPLASTY;  Surgeon: Michael Menz, MD;  Location: [facility] ORS;  Service: Orthopedics;  Laterality: N/A;  . PARTIAL HYSTERECTOMY    . TUBAL LIGATION      Current Outpatient Prescriptions  Medication Sig Dispense Refill  . acetaminophen (TYLENOL) 500 MG tablet Take 500 mg by mouth as needed.     . cholecalciferol (VITAMIN D) 400 units TABS tablet Take 400 Units by mouth daily.    . meclizine (ANTIVERT) 25 MG tablet Take 1 tablet (25 mg total) by mouth   3 (three) times daily as needed for dizziness. 15 tablet 0  . vitamin B-12 (CYANOCOBALAMIN) 1000 MCG tablet Take 1,000 mcg by mouth daily.     No current facility-administered medications for this visit.     Allergies  Allergen Reactions  . Penicillins     antibiotics    Social History   Social History  . Marital status: Married    Spouse name: N/A  . Number of children: N/A  . Years of education: N/A   Occupational History  . Not on file.   Social History Main Topics  . Smoking status: Never Smoker  . Smokeless tobacco: Never Used  . Alcohol use No  . Drug use: No  . Sexual  activity: Not on file   Other Topics Concern  . Not on file   Social History Narrative  . No narrative on file    Family History  Problem Relation Age of Onset  . Heart block Mother   . Heart failure Father   . Heart disease Sister   . Breast cancer Maternal Aunt     Review of Systems:  As stated in the HPI and otherwise negative.   BP 138/60   Pulse 84   Ht 5' 5" (1.651 m)   Wt 113 lb 12.8 oz (51.6 kg)   SpO2 99%   BMI 18.94 kg/m   Physical Examination: General: Well developed, well nourished, NAD  HEENT: OP clear, mucus membranes moist  SKIN: warm, dry. No rashes. Neuro: No focal deficits  Musculoskeletal: Muscle strength 5/5 all ext  Psychiatric: Mood and affect normal  Neck: No JVD, no carotid bruits, no thyromegaly, no lymphadenopathy.  Lungs:Clear bilaterally, no wheezes, rhonci, crackles Cardiovascular: Regular rate and rhythm. Loud, harsh, late peaking systolic murmur heard best at the RUSB. No gallops or rubs. Abdomen:Soft. Bowel sounds present. Non-tender.  Extremities: No lower extremity edema. Pulses are 2 + in the bilateral DP/PT.  Echo 08/28/16: Left ventricle: The cavity size was normal. There was mild   concentric hypertrophy. Systolic function was normal. The   estimated ejection fraction was in the range of 60% to 65%. Wall   motion was normal; there were no regional wall motion   abnormalities. Doppler parameters are consistent with abnormal   left ventricular relaxation (grade 1 diastolic dysfunction). - Aortic valve: There was severe stenosis. Mean gradient (S): 65 mm   Hg. Valve area (VTI): 0.75 cm^2. - Mitral valve: Valve area by pressure half-time: 2.06 cm^2. - Pulmonary arteries: Systolic pressure was mildly increased. PA   peak pressure: 38 mm Hg (S).  Left ventricle:  The cavity size was normal. There was mild concentric hypertrophy. Systolic function was normal. The estimated ejection fraction was in the range of 60% to 65%. Wall  motion was normal; there were no regional wall motion abnormalities. Doppler parameters are consistent with abnormal left ventricular relaxation (grade 1 diastolic dysfunction).  ------------------------------------------------------------------- Aortic valve:   Trileaflet; mildly thickened, severely calcified leaflets.  Doppler:   There was severe stenosis.   There was no regurgitation.    VTI ratio of LVOT to aortic valve: 0.27. Valve area (VTI): 0.75 cm^2. Indexed valve area (VTI): 0.49 cm^2/m^2. Mean velocity ratio of LVOT to aortic valve: 0.25. Valve area (Vmean): 0.71 cm^2. Indexed valve area (Vmean): 0.46 cm^2/m^2. Mean gradient (S): 65 mm Hg. Peak gradient (S): 106 mm Hg.  ------------------------------------------------------------------- Aorta:  Aortic root: The aortic root was normal in size.  ------------------------------------------------------------------- Mitral valve:   Structurally normal valve.     Mobility was not restricted.  Doppler:  Transvalvular velocity was within the normal range. There was no evidence for stenosis. There was no regurgitation.    Valve area by pressure half-time: 2.06 cm^2. Indexed valve area by pressure half-time: 1.34 cm^2/m^2.    Peak gradient (D): 4 mm Hg.  ------------------------------------------------------------------- Left atrium:  The atrium was normal in size.  ------------------------------------------------------------------- Right ventricle:  The cavity size was normal. Wall thickness was normal. Systolic function was normal.  ------------------------------------------------------------------- Pulmonic valve:    Doppler:  Transvalvular velocity was within the normal range. There was no evidence for stenosis.  ------------------------------------------------------------------- Tricuspid valve:   Structurally normal valve.    Doppler: Transvalvular velocity was within the normal range. There was  mild regurgitation.  ------------------------------------------------------------------- Pulmonary artery:   The main pulmonary artery was normal-sized. Systolic pressure was mildly increased.  ------------------------------------------------------------------- Right atrium:  The atrium was normal in size.  ------------------------------------------------------------------- Pericardium:  There was no pericardial effusion.  ------------------------------------------------------------------- Systemic veins: Inferior vena cava: Poorly visualized. The vessel was normal in size.  ------------------------------------------------------------------- Measurements   Left ventricle                           Value          Reference  LV ID, ED, PLAX chordal          (L)     32    mm       43 - 52  LV ID, ES, PLAX chordal          (L)     19    mm       23 - 38  LV fx shortening, PLAX chordal           41    %        >=29  LV PW thickness, ED                      11    mm       ----------  IVS/LV PW ratio, ED                      1              <=1.3  Stroke volume, 2D                        93    ml       ----------  Stroke volume/bsa, 2D                    61    ml/m^2   ----------  LV e&', lateral                           10.3  cm/s     ----------  LV E/e&', lateral                         9.09           ----------  LV e&', medial                            5.87  cm/s     ----------  LV E/e&', medial                            15.95          ----------  LV e&', average                           8.09  cm/s     ----------  LV E/e&', average                         11.58          ----------    Ventricular septum                       Value          Reference  IVS thickness, ED                        11    mm       ----------    LVOT                                     Value          Reference  LVOT ID, S                               19    mm       ----------  LVOT area                                 2.84  cm^2     ----------  LVOT ID                                  19    mm       ----------  LVOT mean velocity, S                    94.1  cm/s     ----------  LVOT VTI, S                              32.9  cm       ----------  Stroke volume (SV), LVOT DP              93.3  ml       ----------  Stroke index (SV/bsa), LVOT DP           60.8  ml/m^2   ----------    Aortic valve                             Value          Reference  Aortic valve peak velocity, S            515   cm/s     ----------  Aortic valve mean velocity, S            378   cm/s     ----------  Aortic valve VTI, S                        124   cm       ----------  Aortic mean gradient, S                  65    mm Hg    ----------  Aortic peak gradient, S                  106   mm Hg    ----------  VTI ratio, LVOT/AV                       0.27           ----------  Aortic valve area, VTI                   0.75  cm^2     ----------  Aortic valve area/bsa, VTI               0.49  cm^2/m^2 ----------  Velocity ratio, mean, LVOT/AV            0.25           ----------  Aortic valve area, mean velocity         0.71  cm^2     ----------  Aortic valve area/bsa, mean              0.46  cm^2/m^2 ----------  velocity    Aorta                                    Value          Reference  Aortic root ID, ED                       26    mm       ----------  Ascending aorta ID, A-P, S               28    mm       ----------    Left atrium                              Value          Reference  LA ID, A-P, ES                           28    mm       ----------  LA ID/bsa, A-P                           1.83  cm/m^2   <=2.2  LA volume, ES, 1-p A4C                   31.4  ml       ----------  LA volume/bsa, ES, 1-p A4C               20.5  ml/m^2   ----------  LA volume, ES, 1-p A2C                   47.6  ml       ----------  LA volume/bsa, ES, 1-p A2C               31      ml/m^2   ----------    Mitral valve                              Value          Reference  Mitral E-wave peak velocity              93.6  cm/s     ----------  Mitral A-wave peak velocity              121   cm/s     ----------  Mitral deceleration time         (H)     366   ms       150 - 230  Mitral pressure half-time                107   ms       ----------  Mitral peak gradient, D                  4     mm Hg    ----------  Mitral E/A ratio, peak                   0.8            ----------  Mitral valve area, PHT, DP               2.06  cm^2     ----------  Mitral valve area/bsa, PHT, DP           1.34  cm^2/m^2 ----------    Pulmonary arteries                       Value          Reference  PA pressure, S, DP               (H)     38    mm Hg    <=30    Tricuspid valve                          Value          Reference  Tricuspid regurg peak velocity           286   cm/s     ----------  Tricuspid peak RV-RA gradient            33    mm Hg    ----------    Right atrium                             Value          Reference  RA ID, S-I, ES, A4C                      40.9  mm       34 - 49  RA area, ES, A4C                         9.17  cm^2     8.3 - 19.5  RA volume, ES, A/L                       16.2  ml       ----------    RA volume/bsa, ES, A/L                   10.6  ml/m^2   ----------    Right ventricle                          Value          Reference  TAPSE                                    26.2  mm       ----------  RV s&', lateral, S                        12.7  cm/s     ----------    Pulmonic valve                           Value          Reference  Pulmonic valve peak velocity, S          123   cm/s     ----------  EKG:  EKG is not ordered today. The ekg ordered today demonstrates   Recent Labs: 09/28/2016: ALT 15; BUN 24; Creatinine, Ser 0.83; Hemoglobin 11.9; Platelets 173; Potassium 3.7; Sodium 139   Lipid Panel No results found for: CHOL, TRIG, HDL, CHOLHDL, VLDL, LDLCALC, LDLDIRECT   Wt Readings from Last 3  Encounters:  10/01/16 113 lb 12.8 oz (51.6 kg)  09/28/16 112 lb (50.8 kg)  09/19/16 114 lb (51.7 kg)     Other studies Reviewed: Additional studies/ records that were reviewed today include: Echo images Review of the above records demonstrates: severe AS, normal LV systolic function  STS Risk Score:  Risk of Mortality: 2.9%  Morbidity or Mortality: 15.341%  Long Length of Stay: 6.955%  Short Length of Stay: 29.461%  Permanent Stroke: 1.877%  Prolonged Ventilation: 9.19%  DSW Infection: 0.124%  Renal Failure: 2.579%  Reoperation: 8.258%  Assessment and Plan:   1. Severe aortic valve stenosis: She has stage D symptomatic aortic valve stenosis. I have personally reviewed the echo images. The aortic valve is thickened, calcified with limited leaflet mobility. I think she would benefit from AVR. Given advanced age, she is not an optimal candidate for conventional AVR by surgical approach. I think she may be a good candidate for TAVR. I have reviewed the TAVR procedure in detail today with the patient and her family including procedural details and the risks and benefits of the procedure. She would like to proceed with planning for TAVR. I will arrange a right and left heart catheterization at [facility] 10/05/16 at 10:30 am. Risks and benefits of the cath procedure reviewed with the patient. After the cath, she will have her CT scans and will then be referred to see one of the CT surgeons on our TAVR team.   She does have prior GI bleeding due to AVMs. Hopefully, this will remain stable and perhaps improve with the valve replacement. She will be started on ASA now. She will need to be on ASA and Plavix following the TAVR.     Current medicines are reviewed at length with the patient today.  The patient does not have concerns regarding medicines.  The following changes have been made:  no change  Labs/   tests ordered today include:   Orders Placed This Encounter  Procedures  . CT ANGIO CHEST  AORTA W &/OR WO CONTRAST  . CT Angio Abd/Pel w/ and/or w/o  . CT CORONARY MORPH W/CTA COR W/SCORE W/CA W/CM &/OR WO/CM  . Pulmonary function test     Disposition:   FU with the TAVR team as above.    Signed, Taija Mathias, MD 10/01/2016 9:42 AM    [facility] [address], [place], [place]  [address] Phone: [phone]; Fax: [phone]    

## 2016-10-01 NOTE — Patient Instructions (Addendum)
Medication Instructions:  Your physician has recommended you make the following change in your medication:  1. START Aspirin  take one tablet by mouth daily   Labwork: No new orders.   Testing/Procedures: Your physician has requested that you have a cardiac catheterization. Cardiac catheterization is used to diagnose and/or treat various heart conditions. Doctors may recommend this procedure for a number of different reasons. The most common reason is to evaluate chest pain. Chest pain can be a symptom of coronary artery disease (CAD), and cardiac catheterization can show whether plaque is narrowing or blocking your heart's arteries. This procedure is also used to evaluate the valves, as well as measure the blood flow and oxygen levels in different parts of your heart. For further information please visit [email or web address] Please follow instruction sheet, as given.    [facility] CARDIOVASCULAR DIVISION [facility] [address] OFFICE [address] [place] [address] Dept: [phone] Loc: [phone]  CHUDNEY SCHEFFLER  10/01/2016  You are scheduled for a Cardiac Catheterization on Friday, October 5 with Dr. Verne Carrow.  1. Please arrive at the [facility] (Main Entrance A) at [facility]: [address] [place], [place] [address] at 8:00 AM (two hours before your procedure to ensure your preparation). Free valet parking service is available.   Special note: Every effort is made to have your procedure done on time. Please understand that emergencies sometimes delay scheduled procedures.  2. Diet: Do not eat or drink anything after midnight prior to your procedure except sips of water to take medications.  3. Labs: Your labs will be performed at the hospital after you arrive for your procedure.  4. Medication instructions in preparation for your procedure:  On the morning of your procedure, take your Aspirin and any  morning medicines NOT listed above.  You may use sips of water.  5. Plan for one night stay--bring personal belongings.  6. Bring a current list of your medications and current insurance cards.  7. You MUST have a responsible person to drive you home.  8. Someone MUST be with you the first 24 hours after you arrive home or your discharge will be delayed.  9. Please wear clothes that are easy to get on and off and wear slip-on shoes.  Thank you for allowing [place] to care for you!   -- [facility]    Your physician has requested that you have cardiac CT. Cardiac computed tomography (CT) is a painless test that uses an x-ray machine to take clear, detailed pictures of your heart. For further information please visit [email or web address] Please follow instruction sheet as given.   Non-Cardiac CT Angiography (CTA), is a special type of CT scan that uses a computer to produce multi-dimensional views of major blood vessels throughout the body. In CT angiography, a contrast material is injected through an IV to help visualize the blood vessels (CTA Chest/Abdomen and Pelvis)  Pre-CT instructions: Please arrive in Admitting at [facility] on Friday, October 12, 2016 at 9:00AM for check-in 1. No solid foods, No caffeine or smoking 4 hours prior to CT. You can have liquids but no caffeine. 2. No herbal supplements by mouth 24 hours prior to CT. 3. No sexual enhancement drugs/herbs 72 hours prior to CT.    Your physician has recommended that you have a pulmonary function test. Pulmonary Function Tests are a group of tests that measure how well air moves in and out  of your lungs. Friday, October 12, 2016 at 11:00 AM  Your physician has requested Outpatient Physical Therapy evaluation.  This will be done at [facility] at [facility] [address] [place] [address] (will contact with  appointment) Follow-Up: You have been referred to Dr Cornelius Moras and Dr Laneta Simmers at [facility] for further TAVR evaluation.  [address] [place], [address], [place], [place] [address] (will contact with appointment)   Any Other Special Instructions Will Be Listed Below (If Applicable).     If you need a refill on your cardiac medications before your next appointment, please call your pharmacy.

## 2016-10-04 ENCOUNTER — Telehealth: Payer: Self-pay

## 2016-10-04 NOTE — Telephone Encounter (Signed)
Patient contacted pre-catheterization at [facility] scheduled for:  10/05/2016 @ 1030 Verified arrival time and place:  [facility] @ 0800 Confirmed AM meds to be taken pre-cath with sip of water: Take ASA Confirmed patient has responsible person to drive home post procedure and observe patient for 24 hours:  yes Addl concerns:  none

## 2016-10-05 ENCOUNTER — Ambulatory Visit (HOSPITAL_COMMUNITY)
Admission: RE | Admit: 2016-10-05 | Discharge: 2016-10-05 | Disposition: A | Discharge status: Home / Self Care | Payer: Medicare Other | Source: Ambulatory Visit | Attending: Cardiovascular Disease | Admitting: Cardiovascular Disease

## 2016-10-05 ENCOUNTER — Encounter (HOSPITAL_COMMUNITY)
Admission: RE | Disposition: A | Discharge status: Home / Self Care | Payer: Self-pay | Source: Ambulatory Visit | Attending: Cardiovascular Disease

## 2016-10-05 DIAGNOSIS — G8929 Other chronic pain: Secondary | ICD-10-CM | POA: Insufficient documentation

## 2016-10-05 DIAGNOSIS — J45909 Unspecified asthma, uncomplicated: Secondary | ICD-10-CM | POA: Diagnosis not present

## 2016-10-05 DIAGNOSIS — M549 Dorsalgia, unspecified: Secondary | ICD-10-CM | POA: Insufficient documentation

## 2016-10-05 DIAGNOSIS — Z79899 Other long term (current) drug therapy: Secondary | ICD-10-CM | POA: Diagnosis not present

## 2016-10-05 DIAGNOSIS — I350 Nonrheumatic aortic (valve) stenosis: Secondary | ICD-10-CM | POA: Insufficient documentation

## 2016-10-05 DIAGNOSIS — D509 Iron deficiency anemia, unspecified: Secondary | ICD-10-CM | POA: Insufficient documentation

## 2016-10-05 DIAGNOSIS — Z880 Allergy status to penicillin: Secondary | ICD-10-CM | POA: Diagnosis not present

## 2016-10-05 HISTORY — PX: RIGHT/LEFT HEART CATH AND CORONARY ANGIOGRAPHY: CATH118266

## 2016-10-05 LAB — POCT ACTIVATED CLOTTING TIME
Activated Clotting Time: 180 seconds
Activated Clotting Time: 158 seconds

## 2016-10-05 LAB — POCT I-STAT 3, ART BLOOD GAS (G3+)
BICARBONATE: 23.7 mmol/L (ref 20.0–28.0)
O2 SAT: 100 %
PCO2 ART: 34.7 mmHg (ref 32.0–48.0)
PO2 ART: 166 mmHg — AB (ref 83.0–108.0)
TCO2: 25 mmol/L (ref 22–32)
pH, Arterial: 7.443 (ref 7.350–7.450)

## 2016-10-05 LAB — POCT I-STAT 3, VENOUS BLOOD GAS (G3P V)
BICARBONATE: 24.7 mmol/L (ref 20.0–28.0)
O2 Saturation: 77 %
PH VEN: 7.392 (ref 7.250–7.430)
PO2 VEN: 42 mmHg (ref 32.0–45.0)
TCO2: 26 mmol/L (ref 22–32)
pCO2, Ven: 40.6 mmHg — ABNORMAL LOW (ref 44.0–60.0)

## 2016-10-05 LAB — PROTIME-INR
INR: 1.07
Prothrombin Time: 13.8 seconds (ref 11.4–15.2)

## 2016-10-05 SURGERY — RIGHT/LEFT HEART CATH AND CORONARY ANGIOGRAPHY
Anesthesia: LOCAL

## 2016-10-05 MED ORDER — SODIUM CHLORIDE 0.9% FLUSH: 3.0000 mL | INTRAVENOUS | Status: DC | PRN

## 2016-10-05 MED ORDER — IOPAMIDOL (ISOVUE-370) INJECTION 76%
INTRAVENOUS | Status: AC
Start: 2016-10-05 — End: 2016-10-05
  Filled 2016-10-05: qty 100

## 2016-10-05 MED ORDER — DIAZEPAM 5 MG PO TABS
ORAL_TABLET | ORAL | Status: AC
  Administered 2016-10-05: 5 mg via ORAL
  Filled 2016-10-05: qty 1

## 2016-10-05 MED ORDER — MIDAZOLAM HCL 2 MG/2ML IJ SOLN
INTRAMUSCULAR | Status: AC
  Filled 2016-10-05: qty 2

## 2016-10-05 MED ORDER — LIDOCAINE HCL (PF) 1 % IJ SOLN
INTRAMUSCULAR | Status: DC | PRN
  Administered 2016-10-05: 20 mL via INTRADERMAL

## 2016-10-05 MED ORDER — SODIUM CHLORIDE 0.9 % IV SOLN
INTRAVENOUS | Status: AC
  Administered 2016-10-05: 09:00:00 via INTRAVENOUS

## 2016-10-05 MED ORDER — ASPIRIN 81 MG PO CHEW: 81.0000 mg | CHEWABLE_TABLET | ORAL | Status: DC

## 2016-10-05 MED ORDER — VERAPAMIL HCL 2.5 MG/ML IV SOLN
INTRAVENOUS | Status: AC
  Filled 2016-10-05: qty 2

## 2016-10-05 MED ORDER — IOPAMIDOL (ISOVUE-370) INJECTION 76%
INTRAVENOUS | Status: DC | PRN
  Administered 2016-10-05: 50 mL via INTRAVENOUS

## 2016-10-05 MED ORDER — SODIUM CHLORIDE 0.9 % IV SOLN: 250.0000 mL | INTRAVENOUS | Status: DC | PRN

## 2016-10-05 MED ORDER — SODIUM CHLORIDE 0.9% FLUSH: 3.0000 mL | Freq: Two times a day (BID) | INTRAVENOUS | Status: DC

## 2016-10-05 MED ORDER — DIAZEPAM 5 MG PO TABS
5.0000 mg | ORAL_TABLET | Freq: Once | ORAL | Status: AC
  Administered 2016-10-05: 5 mg via ORAL

## 2016-10-05 MED ORDER — HEPARIN SODIUM (PORCINE) 1000 UNIT/ML IJ SOLN
INTRAMUSCULAR | Status: DC | PRN
  Administered 2016-10-05: 2500 [IU] via INTRAVENOUS

## 2016-10-05 MED ORDER — HEPARIN (PORCINE) IN NACL 2-0.9 UNIT/ML-% IJ SOLN
INTRAMUSCULAR | Status: AC | PRN
  Administered 2016-10-05: 1000 mL via INTRA_ARTERIAL

## 2016-10-05 MED ORDER — LIDOCAINE HCL 2 % IJ SOLN
INTRAMUSCULAR | Status: AC
  Filled 2016-10-05: qty 10

## 2016-10-05 MED ORDER — MIDAZOLAM HCL 2 MG/2ML IJ SOLN
INTRAMUSCULAR | Status: DC | PRN
  Administered 2016-10-05: 1 mg via INTRAVENOUS

## 2016-10-05 MED ORDER — HEPARIN (PORCINE) IN NACL 2-0.9 UNIT/ML-% IJ SOLN
INTRAMUSCULAR | Status: AC
  Filled 2016-10-05: qty 1000

## 2016-10-05 MED ORDER — HEPARIN SODIUM (PORCINE) 1000 UNIT/ML IJ SOLN
INTRAMUSCULAR | Status: AC
  Filled 2016-10-05: qty 1

## 2016-10-05 MED ORDER — SODIUM CHLORIDE 0.9 % IV SOLN: INTRAVENOUS | Status: AC

## 2016-10-05 MED ORDER — VERAPAMIL HCL 2.5 MG/ML IV SOLN
INTRAVENOUS | Status: DC | PRN
  Administered 2016-10-05: 10 mL via INTRA_ARTERIAL

## 2016-10-05 SURGICAL SUPPLY — 17 items
CATH INFINITI 5 FR JL3.5 (CATHETERS) ×2 IMPLANT
CATH INFINITI 5FR AL1 (CATHETERS) ×2 IMPLANT
CATH INFINITI JR4 5F (CATHETERS) ×2 IMPLANT
CATH SWAN GANZ 7F STRAIGHT (CATHETERS) ×2 IMPLANT
COVER PRB 48X5XTLSCP FOLD TPE (BAG) ×1 IMPLANT
COVER PROBE 5X48 (BAG) ×1
DEVICE RAD COMP TR BAND LRG (VASCULAR PRODUCTS) ×2 IMPLANT
GLIDESHEATH SLEND SS 6F .021 (SHEATH) ×2 IMPLANT
GUIDEWIRE INQWIRE 1.5J.035X260 (WIRE) ×1 IMPLANT
INQWIRE 1.5J .035X260CM (WIRE) ×2
KIT HEART LEFT (KITS) ×2 IMPLANT
PACK CARDIAC CATHETERIZATION (CUSTOM PROCEDURE TRAY) ×2 IMPLANT
SHEATH GLIDE SLENDER 4/5FR (SHEATH) IMPLANT
SHEATH PINNACLE 7F 10CM (SHEATH) ×2 IMPLANT
TRANSDUCER W/STOPCOCK (MISCELLANEOUS) ×2 IMPLANT
TUBING CIL FLEX 10 FLL-RA (TUBING) ×2 IMPLANT
WIRE EMERALD ST .035X150CM (WIRE) ×2 IMPLANT

## 2016-10-05 NOTE — H&P (View-Only) (Signed)
Multi-Disciplinary Valve Clinic Note  Referring Physician: Arida  Chief Complaint  Patient presents with  . New Patient (Initial Visit)    Severe AS,TAVR discussion      History of Present Illness: 83 yo female with history of iron deficiency anemia, prior GI bleeding due to AVMs, asthma and severe aortic valve stenosis who is here today as a new consult at the request of Dr. Kirke Corin for discussion regarding her aortic valve stenosis and possible TAVR. She has prior GI bleeding due to AVMs. This has been stable recently. She has chronic back pain. She has anxiety. She has been followed for years for aortic stenosis. Echo 08/28/16 with normal LV systolic function, LVEF=60-65%. The aortic valve stenosis has progressed. Mean gradient 65 mmHg, peak gradient 106 mmHg, AVA 0.75 cm2. No mitral valve disease. She was seen in the ED at [facility] on 09/28/16 with c/o headache and dizziness. CTA head in th ED showed a small (2mm) IC aneurysm, mild carotid disease and a 10 mm nodular lesion in the left vallecula 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  She has plans to see Neurosurgery and ENT here in [place].   She is retired from a Health and safety inspector. She is limited in mobility due to chronic back pain and following her hip fracture last year. She is here today with her son and daughter. No LE edema or pain in legs. She describes worsened dyspnea with exertion. She has no chest pain. She has occasional dizziness with no syncope.   Primary Care Physician: Marguarite Arbour, MD Primary Cardiologist: Kirke Corin   Past Medical History:  Diagnosis Date  . Aortic stenosis    a. TTE 8/17: nl EF, mod to sev AS w/ mean gradient 28 mmHg, valve area 0.97, mod pulm HTN, small pericardial effusion  . Arrhythmia   . Asthma   . AVM (arteriovenous malformation)    a. small intestine   . Broken back   . Broken wrist   . Hip fracture (HCC)    a. in the setting of mechanical fall  . Iron deficiency anemia      a. requiring periodic transfusions  . Syncope and collapse     Past Surgical History:  Procedure Laterality Date  . APPENDECTOMY    . BACK SURGERY     back fusion  . ESOPHAGOGASTRODUODENOSCOPY (EGD) WITH PROPOFOL N/A 11/15/2015   Procedure: ESOPHAGOGASTRODUODENOSCOPY (EGD) WITH PROPOFOL;  Surgeon: [name], MD;  Location: [facility] ENDOSCOPY;  Service: Endoscopy;  Laterality: N/A;  . FRACTURE SURGERY    . GIVENS CAPSULE STUDY N/A 02/20/2016   Procedure: GIVENS CAPSULE STUDY;  Surgeon: Scot Jun, MD;  Location: [facility] ENDOSCOPY;  Service: Endoscopy;  Laterality: N/A;  . HIP FRACTURE SURGERY    . INTRAMEDULLARY (IM) NAIL INTERTROCHANTERIC Right 11/18/2015   Procedure: INTRAMEDULLARY (IM) NAIL INTERTROCHANTRIC;  Surgeon: Juanell Fairly, MD;  Location: [facility] ORS;  Service: Orthopedics;  Laterality: Right;  . KYPHOPLASTY N/A 08/19/2014   Procedure: KYPHOPLASTY;  Surgeon: Kennedy Bucker, MD;  Location: [facility] ORS;  Service: Orthopedics;  Laterality: N/A;  . PARTIAL HYSTERECTOMY    . TUBAL LIGATION      Current Outpatient Prescriptions  Medication Sig Dispense Refill  . acetaminophen (TYLENOL) 500 MG tablet Take 500 mg by mouth as needed.     . cholecalciferol (VITAMIN D) 400 units TABS tablet Take 400 Units by mouth daily.    . meclizine (ANTIVERT) 25 MG tablet Take 1 tablet (25 mg total) by mouth  3 (three) times daily as needed for dizziness. 15 tablet 0  . vitamin B-12 (CYANOCOBALAMIN) 1000 MCG tablet Take 1,000 mcg by mouth daily.     No current facility-administered medications for this visit.     Allergies  Allergen Reactions  . Penicillins     antibiotics    Social History   Social History  . Marital status: Married    Spouse name: N/A  . Number of children: N/A  . Years of education: N/A   Occupational History  . Not on file.   Social History Main Topics  . Smoking status: Never Smoker  . Smokeless tobacco: Never Used  . Alcohol use No  . Drug use: No  . Sexual  activity: Not on file   Other Topics Concern  . Not on file   Social History Narrative  . No narrative on file    Family History  Problem Relation Age of Onset  . Heart block Mother   . Heart failure Father   . Heart disease Sister   . Breast cancer Maternal Aunt     Review of Systems:  As stated in the HPI and otherwise negative.   BP 138/60   Pulse 84   Ht  (1.651 m)   Wt 113 lb 12.8 oz (51.6 kg)   SpO2 99%   BMI 18.94 kg/m   Physical Examination: General: Well developed, well nourished, NAD  HEENT: OP clear, mucus membranes moist  SKIN: warm, dry. No rashes. Neuro: No focal deficits  Musculoskeletal: Muscle strength 5/5 all ext  Psychiatric: Mood and affect normal  Neck: No JVD, no carotid bruits, no thyromegaly, no lymphadenopathy.  Lungs:Clear bilaterally, no wheezes, rhonci, crackles Cardiovascular: Regular rate and rhythm. Loud, harsh, late peaking systolic murmur heard best at the RUSB. No gallops or rubs. Abdomen:Soft. Bowel sounds present. Non-tender.  Extremities: No lower extremity edema. Pulses are 2 + in the bilateral DP/PT.  Echo 08/28/16: Left ventricle: The cavity size was normal. There was mild   concentric hypertrophy. Systolic function was normal. The   estimated ejection fraction was in the range of 60% to 65%. Wall   motion was normal; there were no regional wall motion   abnormalities. Doppler parameters are consistent with abnormal   left ventricular relaxation (grade 1 diastolic dysfunction). - Aortic valve: There was severe stenosis. Mean gradient (S): 65 mm   Hg. Valve area (VTI): 0.75 cm^2. - Mitral valve: Valve area by pressure half-time: 2.06 cm^2. - Pulmonary arteries: Systolic pressure was mildly increased. PA   peak pressure: 38 mm Hg (S).  Left ventricle:  The cavity size was normal. There was mild concentric hypertrophy. Systolic function was normal. The estimated ejection fraction was in the range of 60% to 65%. Wall  motion was normal; there were no regional wall motion abnormalities. Doppler parameters are consistent with abnormal left ventricular relaxation (grade 1 diastolic dysfunction).  ------------------------------------------------------------------- Aortic valve:   Trileaflet; mildly thickened, severely calcified leaflets.  Doppler:   There was severe stenosis.   There was no regurgitation.    VTI ratio of LVOT to aortic valve: 0.27. Valve area (VTI): 0.75 cm^2. Indexed valve area (VTI): 0.49 cm^2/m^2. Mean velocity ratio of LVOT to aortic valve: 0.25. Valve area (Vmean): 0.71 cm^2. Indexed valve area (Vmean): 0.46 cm^2/m^2. Mean gradient (S): 65 mm Hg. Peak gradient (S): 106 mm Hg.  ------------------------------------------------------------------- Aorta:  Aortic root: The aortic root was normal in size.  ------------------------------------------------------------------- Mitral valve:   Structurally normal valve.  Mobility was not restricted.  Doppler:  Transvalvular velocity was within the normal range. There was no evidence for stenosis. There was no regurgitation.    Valve area by pressure half-time: 2.06 cm^2. Indexed valve area by pressure half-time: 1.34 cm^2/m^2.    Peak gradient (D): 4 mm Hg.  ------------------------------------------------------------------- Left atrium:  The atrium was normal in size.  ------------------------------------------------------------------- Right ventricle:  The cavity size was normal. Wall thickness was normal. Systolic function was normal.  ------------------------------------------------------------------- Pulmonic valve:    Doppler:  Transvalvular velocity was within the normal range. There was no evidence for stenosis.  ------------------------------------------------------------------- Tricuspid valve:   Structurally normal valve.    Doppler: Transvalvular velocity was within the normal range. There was  mild regurgitation.  ------------------------------------------------------------------- Pulmonary artery:   The main pulmonary artery was normal-sized. Systolic pressure was mildly increased.  ------------------------------------------------------------------- Right atrium:  The atrium was normal in size.  ------------------------------------------------------------------- Pericardium:  There was no pericardial effusion.  ------------------------------------------------------------------- Systemic veins: Inferior vena cava: Poorly visualized. The vessel was normal in size.  ------------------------------------------------------------------- Measurements   Left ventricle                           Value          Reference  LV ID, ED, PLAX chordal          (L)     32    mm       43 - 52  LV ID, ES, PLAX chordal          (L)     19    mm       23 - 38  LV fx shortening, PLAX chordal           41    %        >=29  LV PW thickness, ED                      11    mm       ----------  IVS/LV PW ratio, ED                      1              <=1.3  Stroke volume, 2D                        93    ml       ----------  Stroke volume/bsa, 2D                    61    ml/m^2   ----------  LV e&', lateral                           10.3  cm/s     ----------  LV E/e&', lateral                         9.09           ----------  LV e&', medial                            5.87  cm/s     ----------  LV E/e&', medial  15.95          ----------  LV e&', average                           8.09  cm/s     ----------  LV E/e&', average                         11.58          ----------    Ventricular septum                       Value          Reference  IVS thickness, ED                        11    mm       ----------    LVOT                                     Value          Reference  LVOT ID, S                               19    mm       ----------  LVOT area                                 2.84  cm^2     ----------  LVOT ID                                  19    mm       ----------  LVOT mean velocity, S                    94.1  cm/s     ----------  LVOT VTI, S                              32.9  cm       ----------  Stroke volume (SV), LVOT DP              93.3  ml       ----------  Stroke index (SV/bsa), LVOT DP           60.8  ml/m^2   ----------    Aortic valve                             Value          Reference  Aortic valve peak velocity, S            515   cm/s     ----------  Aortic valve mean velocity, S            378   cm/s     ----------  Aortic valve VTI, S  124   cm       ----------  Aortic mean gradient, S                  65    mm Hg    ----------  Aortic peak gradient, S                  106   mm Hg    ----------  VTI ratio, LVOT/AV                       0.27           ----------  Aortic valve area, VTI                   0.75  cm^2     ----------  Aortic valve area/bsa, VTI               0.49  cm^2/m^2 ----------  Velocity ratio, mean, LVOT/AV            0.25           ----------  Aortic valve area, mean velocity         0.71  cm^2     ----------  Aortic valve area/bsa, mean              0.46  cm^2/m^2 ----------  velocity    Aorta                                    Value          Reference  Aortic root ID, ED                       26    mm       ----------  Ascending aorta ID, A-P, S               28    mm       ----------    Left atrium                              Value          Reference  LA ID, A-P, ES                           28    mm       ----------  LA ID/bsa, A-P                           1.83  cm/m^2   <=2.2  LA volume, ES, 1-p A4C                   31.4  ml       ----------  LA volume/bsa, ES, 1-p A4C               20.5  ml/m^2   ----------  LA volume, ES, 1-p A2C                   47.6  ml       ----------  LA volume/bsa, ES, 1-p A2C               31  ml/m^2   ----------    Mitral valve                              Value          Reference  Mitral E-wave peak velocity              93.6  cm/s     ----------  Mitral A-wave peak velocity              121   cm/s     ----------  Mitral deceleration time         (H)     366   ms       150 - 230  Mitral pressure half-time                107   ms       ----------  Mitral peak gradient, D                  4     mm Hg    ----------  Mitral E/A ratio, peak                   0.8            ----------  Mitral valve area, PHT, DP               2.06  cm^2     ----------  Mitral valve area/bsa, PHT, DP           1.34  cm^2/m^2 ----------    Pulmonary arteries                       Value          Reference  PA pressure, S, DP               (H)     38    mm Hg    <=30    Tricuspid valve                          Value          Reference  Tricuspid regurg peak velocity           286   cm/s     ----------  Tricuspid peak RV-RA gradient            33    mm Hg    ----------    Right atrium                             Value          Reference  RA ID, S-I, ES, A4C                      40.9  mm       34 - 49  RA area, ES, A4C                         9.17  cm^2     8.3 - 19.5  RA volume, ES, A/L                       16.2  ml       ----------  RA volume/bsa, ES, A/L                   10.6  ml/m^2   ----------    Right ventricle                          Value          Reference  TAPSE                                    26.2  mm       ----------  RV s&', lateral, S                        12.7  cm/s     ----------    Pulmonic valve                           Value          Reference  Pulmonic valve peak velocity, S          123   cm/s     ----------  EKG:  EKG is not ordered today. The ekg ordered today demonstrates   Recent Labs: 09/28/2016: ALT 15; BUN 24; Creatinine, Ser 0.83; Hemoglobin 11.9; Platelets 173; Potassium 3.7; Sodium 139   Lipid Panel No results found for: CHOL, TRIG, HDL, CHOLHDL, VLDL, LDLCALC, LDLDIRECT   Wt Readings from Last 3  Encounters:  10/01/16 113 lb 12.8 oz (51.6 kg)  09/28/16 112 lb (50.8 kg)  09/19/16 114 lb (51.7 kg)     Other studies Reviewed: Additional studies/ records that were reviewed today include: Echo images Review of the above records demonstrates: severe AS, normal LV systolic function  STS Risk Score:  Risk of Mortality: 2.9%  Morbidity or Mortality: 15.341%  Long Length of Stay: 6.955%  Short Length of Stay: 29.461%  Permanent Stroke: 1.877%  Prolonged Ventilation: 9.19%  DSW Infection: 0.124%  Renal Failure: 2.579%  Reoperation: 8.258%  Assessment and Plan:   1. Severe aortic valve stenosis: She has stage D symptomatic aortic valve stenosis. I have personally reviewed the echo images. The aortic valve is thickened, calcified with limited leaflet mobility. I think she would benefit from AVR. Given advanced age, she is not an optimal candidate for conventional AVR by surgical approach. I think she may be a good candidate for TAVR. I have reviewed the TAVR procedure in detail today with the patient and her family including procedural details and the risks and benefits of the procedure. She would like to proceed with planning for TAVR. I will arrange a right and left heart catheterization at [facility] 10/05/16 at 10:30 am. Risks and benefits of the cath procedure reviewed with the patient. After the cath, she will have her CT scans and will then be referred to see one of the CT surgeons on our TAVR team.   She does have prior GI bleeding due to AVMs. Hopefully, this will remain stable and perhaps improve with the valve replacement. She will be started on ASA now. She will need to be on ASA and Plavix following the TAVR.     Current medicines are reviewed at length with the patient today.  The patient does not have concerns regarding medicines.  The following changes have been made:  no change  Labs/  tests ordered today include:   Orders Placed This Encounter  Procedures  . CT ANGIO CHEST  AORTA W &/OR WO CONTRAST  . CT Angio Abd/Pel w/ and/or w/o  . CT CORONARY MORPH W/CTA COR W/SCORE W/CA W/CM &/OR WO/CM  . Pulmonary function test     Disposition:   FU with the TAVR team as above.    Signed, Verne Carrow, MD 10/01/2016 9:42 AM    [facility] [address] [place], [place], [place]  [address] Phone: [phone]; Fax: [phone]

## 2016-10-05 NOTE — Discharge Instructions (Signed)
Radial Site Care °Refer to this sheet in the next few weeks. These instructions provide you with information about caring for yourself after your procedure. Your health care provider may also give you more specific instructions. Your treatment has been planned according to current medical practices, but problems sometimes occur. Call your health care provider if you have any problems or questions after your procedure. °What can I expect after the procedure? °After your procedure, it is typical to have the following: °· Bruising at the radial site that usually fades within 1-2 weeks. °· Blood collecting in the tissue (hematoma) that may be painful to the touch. It should usually decrease in size and tenderness within 1-2 weeks. ° °Follow these instructions at home: °· Take medicines only as directed by your health care provider. °· You may shower 24-48 hours after the procedure or as directed by your health care provider. Remove the bandage (dressing) and gently wash the site with plain soap and water. Pat the area dry with a clean towel. Do not rub the site, because this may cause bleeding. °· Do not take baths, swim, or use a hot tub until your health care provider approves. °· Check your insertion site every day for redness, swelling, or drainage. °· Do not apply powder or lotion to the site. °· Do not flex or bend the affected arm for 24 hours or as directed by your health care provider. °· Do not push or pull heavy objects with the affected arm for 24 hours or as directed by your health care provider. °· Do not lift over 10 lb (4.5 kg) for 5 days after your procedure or as directed by your health care provider. °· Ask your health care provider when it is okay to: °? Return to work or school. °? Resume usual physical activities or sports. °? Resume sexual activity. °· Do not drive home if you are discharged the same day as the procedure. Have someone else drive you. °· You may drive 24 hours after the procedure  unless otherwise instructed by your health care provider. °· Do not operate machinery or power tools for 24 hours after the procedure. °· If your procedure was done as an outpatient procedure, which means that you went home the same day as your procedure, a responsible adult should be with you for the first 24 hours after you arrive home. °· Keep all follow-up visits as directed by your health care provider. This is important. °Contact a health care provider if: °· You have a fever. °· You have chills. °· You have increased bleeding from the radial site. Hold pressure on the site. °Get help right away if: °· You have unusual pain at the radial site. °· You have redness, warmth, or swelling at the radial site. °· You have drainage (other than a small amount of blood on the dressing) from the radial site. °· The radial site is bleeding, and the bleeding does not stop after 30 minutes of holding steady pressure on the site. °· Your arm or hand becomes pale, cool, tingly, or numb. °This information is not intended to replace advice given to you by your health care provider. Make sure you discuss any questions you have with your health care provider. °Document Released: 01/20/2010 Document Revised: 05/26/2015 Document Reviewed: 07/06/2013 °Elsevier Interactive Patient Education © 2018 [employer] °Angiogram, Care After °This sheet gives you information about how to care for yourself after your procedure. Your health care provider may also give you more   specific instructions. If you have problems or questions, contact your health care provider. °What can I expect after the procedure? °After the procedure, it is common to have bruising and tenderness at the catheter insertion area. °Follow these instructions at home: °Insertion site care °· Follow instructions from your health care provider about how to take care of your insertion site. Make sure you: °? Wash your hands with soap and water before you change your bandage  (dressing). If soap and water are not available, use hand sanitizer. °? Change your dressing as told by your health care provider. °? Leave stitches (sutures), skin glue, or adhesive strips in place. These skin closures may need to stay in place for 2 weeks or longer. If adhesive strip edges start to loosen and curl up, you may trim the loose edges. Do not remove adhesive strips completely unless your health care provider tells you to do that. °· Do not take baths, swim, or use a hot tub until your health care provider approves. °· You may shower 24-48 hours after the procedure or as told by your health care provider. °? Gently wash the site with plain soap and water. °? Pat the area dry with a clean towel. °? Do not rub the site. This may cause bleeding. °· Do not apply powder or lotion to the site. Keep the site clean and dry. °· Check your insertion site every day for signs of infection. Check for: °? Redness, swelling, or pain. °? Fluid or blood. °? Warmth. °? Pus or a bad smell. °Activity °· Rest as told by your health care provider, usually for 1-2 days. °· Do not lift anything that is heavier than 10 lbs. (4.5 kg) or as told by your health care provider. °· Do not drive for 24 hours if you were given a medicine to help you relax (sedative). °· Do not drive or use heavy machinery while taking prescription pain medicine. °General instructions °· Return to your normal activities as told by your health care provider, usually in about a week. Ask your health care provider what activities are safe for you. °· If the catheter site starts bleeding, lie flat and put pressure on the site. If the bleeding does not stop, get help right away. This is a medical emergency. °· Drink enough fluid to keep your urine clear or pale yellow. This helps flush the contrast dye from your body. °· Take over-the-counter and prescription medicines only as told by your health care provider. °· Keep all follow-up visits as told by your  health care provider. This is important. °Contact a health care provider if: °· You have a fever or chills. °· You have redness, swelling, or pain around your insertion site. °· You have fluid or blood coming from your insertion site. °· The insertion site feels warm to the touch. °· You have pus or a bad smell coming from your insertion site. °· You have bruising around the insertion site. °· You notice blood collecting in the tissue around the catheter site (hematoma). The hematoma may be painful to the touch. °Get help right away if: °· You have severe pain at the catheter insertion area. °· The catheter insertion area swells very fast. °· The catheter insertion area is bleeding, and the bleeding does not stop when you hold steady pressure on the area. °· The area near or just beyond the catheter insertion site becomes pale, cool, tingly, or numb. °These symptoms may represent a serious problem that is   an emergency. Do not wait to see if the symptoms will go away. Get medical help right away. Call your local emergency services (911 in the [place] Do not drive yourself to the hospital. °Summary °· After the procedure, it is common to have bruising and tenderness at the catheter insertion area. °· After the procedure, it is important to rest and drink plenty of fluids. °· Do not take baths, swim, or use a hot tub until your health care provider says it is okay to do so. You may shower 24-48 hours after the procedure or as told by your health care provider. °· If the catheter site starts bleeding, lie flat and put pressure on the site. If the bleeding does not stop, get help right away. This is a medical emergency. °This information is not intended to replace advice given to you by your health care provider. Make sure you discuss any questions you have with your health care provider. °Document Released: 07/06/2004 Document Revised: 11/23/2015 Document Reviewed: 11/23/2015 °Elsevier Interactive Patient Education ©  2017 [employer] ° °

## 2016-10-05 NOTE — Progress Notes (Signed)
Site area: rt groin fv sheath Site Prior to Removal:  Level 0 Pressure Applied For: 15 minutes Manual:   yes Patient Status During Pull:  stable Post Pull Site:  Level 0 Post Pull Instructions Given:  yes Post Pull Pulses Present: yes Dressing Applied:  Gauze and tegaderm Bedrest begins @  1400 Comments:

## 2016-10-05 NOTE — Interval H&P Note (Signed)
History and Physical Interval Note:  10/05/2016 11:15 AM  Kelsey Le  has presented today for cardiac cath with the diagnosis of severe aortic stenosis.  The various methods of treatment have been discussed with the patient and family. After consideration of risks, benefits and other options for treatment, the patient has consented to  Procedure(s): RIGHT/LEFT HEART CATH AND CORONARY ANGIOGRAPHY (N/A) as a surgical intervention .  The patient's history has been reviewed, patient examined, no change in status, stable for surgery.  I have reviewed the patient's chart and labs.  Questions were answered to the patient's satisfaction.    Cath Lab Visit (complete for each Cath Lab visit)  Clinical Evaluation Leading to the Procedure:   ACS: No.  Non-ACS:    Anginal Classification: CCS II  Anti-ischemic medical therapy: No Therapy  Non-Invasive Test Results: No non-invasive testing performed  Prior CABG: No previous CABG         Verne Carrow

## 2016-10-08 ENCOUNTER — Encounter (HOSPITAL_COMMUNITY): Payer: Self-pay | Admitting: Cardiovascular Disease

## 2016-10-08 ENCOUNTER — Telehealth: Payer: Self-pay | Admitting: Cardiovascular Disease

## 2016-10-08 MED FILL — Lidocaine HCl Local Inj 2%: INTRAMUSCULAR | Qty: 10 | Status: AC

## 2016-10-08 NOTE — Telephone Encounter (Signed)
I will forward to Dr Kirke Corin, pt's primary cardiologist, for review.

## 2016-10-08 NOTE — Telephone Encounter (Signed)
Called [facility] for clarification of injection; the office is closed. Will call again tomorrow

## 2016-10-08 NOTE — Telephone Encounter (Signed)
Follow up    Morrie Sheldon from the [facility] is calling to find out if pt would be ok to have her shot for her back pain on the 17th.

## 2016-10-08 NOTE — Telephone Encounter (Signed)
I reviewed with Dr Lucilla Edin needs evaluation by PCP/GI for dark stools, should continue aspirin for now, can readdress after PCP/GI evaluation has been done. I spoke with pt's daughter, she is aware of Dr Jenel Lucks recommendation, verbalized understanding, will probably see GI for evaluation.  Pt is scheduled for pain injection in hip 10/17/16, pt's daughter will ask MD doing injection  to contact our office with details/specific requests prior to injection.

## 2016-10-08 NOTE — Telephone Encounter (Signed)
New Message    If not answer on home please call cell  [phone] October 17 the patient is due to get a pain shot will that effect her surgery?   Pt c/o medication issue:  1. Name of Medication: baby aspirin   2. How are you currently taking this medication (dosage and times per day)?  1 daily  3. Are you having a reaction (difficulty breathing--STAT)? yes  4. What is your medication issue?  Pt states her stool is black and thinks she is having internal bleeding

## 2016-10-08 NOTE — Telephone Encounter (Signed)
I spoke with patient's daughter, Olegario Messier. Olegario Messier states pt was started on aspirin 10/01/16 post cath and pre-TAVR. Pt has a history of GI/AVMs in past. Olegario Messier states pt has had a least 2 dark stools in the last week that pt feels is related to starting aspirin. Olegario Messier states pt had not mentioned blood in stools for several weeks until she started aspirin 10/01/16. Olegario Messier is asking if pt should continue aspirin.

## 2016-10-10 ENCOUNTER — Inpatient Hospital Stay
Admission: EM | Admit: 2016-10-10 | Discharge: 2016-10-12 | DRG: 377 | Disposition: A | Discharge status: Home / Self Care | Payer: Medicare Other | Attending: Internal Medicine | Admitting: Internal Medicine

## 2016-10-10 ENCOUNTER — Encounter: Payer: Self-pay | Admitting: Emergency Medicine

## 2016-10-10 DIAGNOSIS — K922 Gastrointestinal hemorrhage, unspecified: Secondary | ICD-10-CM

## 2016-10-10 DIAGNOSIS — Z880 Allergy status to penicillin: Secondary | ICD-10-CM

## 2016-10-10 DIAGNOSIS — Z7982 Long term (current) use of aspirin: Secondary | ICD-10-CM | POA: Diagnosis not present

## 2016-10-10 DIAGNOSIS — K2970 Gastritis, unspecified, without bleeding: Secondary | ICD-10-CM | POA: Diagnosis not present

## 2016-10-10 DIAGNOSIS — K921 Melena: Secondary | ICD-10-CM | POA: Diagnosis present

## 2016-10-10 DIAGNOSIS — K5520 Angiodysplasia of colon without hemorrhage: Secondary | ICD-10-CM | POA: Diagnosis present

## 2016-10-10 DIAGNOSIS — K449 Diaphragmatic hernia without obstruction or gangrene: Secondary | ICD-10-CM | POA: Diagnosis not present

## 2016-10-10 DIAGNOSIS — Z803 Family history of malignant neoplasm of breast: Secondary | ICD-10-CM

## 2016-10-10 DIAGNOSIS — L900 Lichen sclerosus et atrophicus: Secondary | ICD-10-CM | POA: Diagnosis present

## 2016-10-10 DIAGNOSIS — E440 Moderate protein-calorie malnutrition: Secondary | ICD-10-CM | POA: Diagnosis present

## 2016-10-10 DIAGNOSIS — D62 Acute posthemorrhagic anemia: Secondary | ICD-10-CM | POA: Diagnosis present

## 2016-10-10 DIAGNOSIS — D649 Anemia, unspecified: Secondary | ICD-10-CM | POA: Diagnosis present

## 2016-10-10 DIAGNOSIS — Z8249 Family history of ischemic heart disease and other diseases of the circulatory system: Secondary | ICD-10-CM

## 2016-10-10 DIAGNOSIS — R0602 Shortness of breath: Secondary | ICD-10-CM | POA: Diagnosis present

## 2016-10-10 DIAGNOSIS — D509 Iron deficiency anemia, unspecified: Secondary | ICD-10-CM | POA: Diagnosis present

## 2016-10-10 DIAGNOSIS — Z681 Body mass index (BMI) 19 or less, adult: Secondary | ICD-10-CM

## 2016-10-10 DIAGNOSIS — I350 Nonrheumatic aortic (valve) stenosis: Secondary | ICD-10-CM | POA: Diagnosis present

## 2016-10-10 DIAGNOSIS — J45909 Unspecified asthma, uncomplicated: Secondary | ICD-10-CM | POA: Diagnosis present

## 2016-10-10 DIAGNOSIS — R578 Other shock: Secondary | ICD-10-CM | POA: Diagnosis present

## 2016-10-10 LAB — BASIC METABOLIC PANEL
Anion gap: 8 (ref 5–15)
BUN: 48 mg/dL — AB (ref 6–20)
CALCIUM: 8.7 mg/dL — AB (ref 8.9–10.3)
CO2: 22 mmol/L (ref 22–32)
CREATININE: 1.15 mg/dL — AB (ref 0.44–1.00)
Chloride: 110 mmol/L (ref 101–111)
GFR calc Af Amer: 49 mL/min — ABNORMAL LOW (ref >60)
GFR, EST NON AFRICAN AMERICAN: 42 mL/min — AB (ref >60)
GLUCOSE: 99 mg/dL (ref 65–99)
Potassium: 3.7 mmol/L (ref 3.5–5.1)
SODIUM: 140 mmol/L (ref 135–145)

## 2016-10-10 LAB — PROTIME-INR
INR: 1.07
Prothrombin Time: 13.8 seconds (ref 11.4–15.2)

## 2016-10-10 LAB — CBC WITH DIFFERENTIAL/PLATELET
BASOS ABS: 0.1 10*3/uL (ref 0–0.1)
Basophils Relative: 1 %
EOS PCT: 0 %
Eosinophils Absolute: 0 10*3/uL (ref 0–0.7)
HCT: 18.4 % — ABNORMAL LOW (ref 35.0–47.0)
Hemoglobin: 6.1 g/dL — ABNORMAL LOW (ref 12.0–16.0)
LYMPHS PCT: 13 %
Lymphs Abs: 0.8 10*3/uL — ABNORMAL LOW (ref 1.0–3.6)
MCH: 31.5 pg (ref 26.0–34.0)
MCHC: 33.1 g/dL (ref 32.0–36.0)
MCV: 95.4 fL (ref 80.0–100.0)
MONO ABS: 0.3 10*3/uL (ref 0.2–0.9)
Monocytes Relative: 6 %
Neutro Abs: 4.6 10*3/uL (ref 1.4–6.5)
Neutrophils Relative %: 80 %
PLATELETS: 147 10*3/uL — AB (ref 150–440)
RBC: 1.93 MIL/uL — ABNORMAL LOW (ref 3.80–5.20)
RDW: 14.8 % — AB (ref 11.5–14.5)
WBC: 5.8 10*3/uL (ref 3.6–11.0)

## 2016-10-10 LAB — PREPARE RBC (CROSSMATCH)

## 2016-10-10 MED ORDER — ONDANSETRON HCL 4 MG/2ML IJ SOLN
4.0000 mg | Freq: Once | INTRAMUSCULAR | Status: AC
  Administered 2016-10-10: 4 mg via INTRAVENOUS

## 2016-10-10 MED ORDER — SODIUM CHLORIDE 0.9 % IV BOLUS (SEPSIS)
500.0000 mL | Freq: Once | INTRAVENOUS | Status: AC
  Administered 2016-10-10: 500 mL via INTRAVENOUS

## 2016-10-10 MED ORDER — SODIUM CHLORIDE 0.9 % IV SOLN
80.0000 mg | Freq: Once | INTRAVENOUS | Status: AC
  Administered 2016-10-10: 20:00:00 80 mg via INTRAVENOUS
  Filled 2016-10-10: qty 80

## 2016-10-10 MED ORDER — SODIUM CHLORIDE 0.9 % IV SOLN
10.0000 mL/h | Freq: Once | INTRAVENOUS | Status: AC
  Administered 2016-10-12: 14:00:00 via INTRAVENOUS

## 2016-10-10 MED ORDER — SODIUM CHLORIDE 0.9 % IV SOLN
8.0000 mg/h | INTRAVENOUS | Status: DC
  Administered 2016-10-10: 8 mg/h via INTRAVENOUS
  Filled 2016-10-10: qty 80

## 2016-10-10 MED ORDER — PANTOPRAZOLE SODIUM 40 MG IV SOLR: 40.0000 mg | Freq: Two times a day (BID) | INTRAVENOUS | Status: DC

## 2016-10-10 MED ORDER — ACETAMINOPHEN 325 MG PO TABS
ORAL_TABLET | ORAL | Status: AC
  Filled 2016-10-10: qty 2

## 2016-10-10 MED ORDER — ACETAMINOPHEN 325 MG PO TABS
650.0000 mg | ORAL_TABLET | Freq: Once | ORAL | Status: AC
  Administered 2016-10-10: 650 mg via ORAL

## 2016-10-10 MED ORDER — ONDANSETRON HCL 4 MG/2ML IJ SOLN
INTRAMUSCULAR | Status: AC
  Administered 2016-10-10: 4 mg via INTRAVENOUS
  Filled 2016-10-10: qty 2

## 2016-10-10 NOTE — ED Notes (Signed)
Pt stuck x1, small IV.  EDP requesting a larger IV and unable to get blood off of IV at this time.  Lab requested to come draw blood.

## 2016-10-10 NOTE — ED Notes (Addendum)
Emergent blood started at 2043 at 125/ml hour for first 15 minutes.  Pt c/o head ache and nausea at this time.  EDP notified and VO given for  tylenol PO and  IV push.  Emergent unit [phone]; O positive; Expiration Date: 10/23/2016.  Verified with Wenda Low, RN at this time.  Pt A&Ox4.  Tolerating well.   EDP requesting to have blood transferred over 1 hour.

## 2016-10-10 NOTE — ED Notes (Signed)
Blood administration increased to 332ml/hour per EDP request.

## 2016-10-10 NOTE — ED Notes (Signed)
Blood unit [id number] ended at 2157.  Pt tolerated well.

## 2016-10-10 NOTE — ED Notes (Signed)
Emergent blood started at 2043 at 125/ml hour for first 15 minutes.  Pt c/o head ache and nausea at this time.  EDP notified and VO given for  tylenol PO and  IV push.  Emergent unit [phone]; O positive; Expiration Date: 10/23/2016.  Verified with Wenda Low, RN at this time.  Pt A&Ox4.  Tolerating well.

## 2016-10-10 NOTE — ED Provider Notes (Signed)
[facility] Emergency Department Provider Note  ____________________________________________  Time seen: Approximately 8:11 PM  I have reviewed the triage vital signs and the nursing notes.   HISTORY  Chief Complaint Shortness of Breath   HPI Kelsey Le is a 83 y.o. female with a history of aortic stenosis, GI bleed, iron deficiency anemia who presents for evaluation of melena. Patient has been started on aspirin in anticipation for her valve replacement surgery.After 5 days on aspirin patient started having melena. Patient has had 6 days of several daily melanotic stools. Since yesterday she has had severe dyspnea on exertion. She denies dizziness. No nausea or vomiting. No abdominal pain. Patient has had prior GI bleeds in the past requiring blood transfusion. She is not on any other blood thinners.  Past Medical History:  Diagnosis Date  . Aortic stenosis    a. TTE 8/17: nl EF, mod to sev AS w/ mean gradient 28 mmHg, valve area 0.97, mod pulm HTN, small pericardial effusion  . Arrhythmia   . Asthma   . AVM (arteriovenous malformation)    a. small intestine   . Broken back   . Broken wrist   . Hip fracture (HCC)    a. in the setting of mechanical fall  . Iron deficiency anemia    a. requiring periodic transfusions  . Syncope and collapse     Patient Active Problem List   Diagnosis Date Noted  . Iron deficiency anemia 01/15/2016  . Hip fracture (HCC) 11/17/2015  . Acute posthemorrhagic anemia 11/16/2015  . H/O transfusion of packed red blood cells 11/16/2015  . Elevated troponin 11/16/2015  . Severe aortic stenosis 11/16/2015  . Thrombocytopenia (HCC) 11/16/2015  . Generalized weakness 11/16/2015  . Melena 11/14/2015  . Lichen sclerosus 10/31/2015  . Constipation 10/24/2015  . DDD (degenerative disc disease), lumbar 11/02/2014  . Mild intermittent asthma without complication 03/05/2014  . GI bleed 03/01/2014  . Lumbar radiculitis  12/16/2013  . Aortic stenosis 07/27/2013  . Osteoarthritis of hip 07/06/2013  . Trochanteric bursitis 07/06/2013  . Heart murmur 04/26/2013  . SOB (shortness of breath) 04/26/2013  . Elevated blood pressure reading without diagnosis of hypertension 04/26/2013  . Left wrist fracture 12/05/2008    Past Surgical History:  Procedure Laterality Date  . APPENDECTOMY    . BACK SURGERY     back fusion  . ESOPHAGOGASTRODUODENOSCOPY (EGD) WITH PROPOFOL N/A 11/15/2015   Procedure: ESOPHAGOGASTRODUODENOSCOPY (EGD) WITH PROPOFOL;  Surgeon: [name], MD;  Location: [facility] ENDOSCOPY;  Service: Endoscopy;  Laterality: N/A;  . FRACTURE SURGERY    . GIVENS CAPSULE STUDY N/A 02/20/2016   Procedure: GIVENS CAPSULE STUDY;  Surgeon: Scot Jun, MD;  Location: [facility] ENDOSCOPY;  Service: Endoscopy;  Laterality: N/A;  . HIP FRACTURE SURGERY    . INTRAMEDULLARY (IM) NAIL INTERTROCHANTERIC Right 11/18/2015   Procedure: INTRAMEDULLARY (IM) NAIL INTERTROCHANTRIC;  Surgeon: Juanell Fairly, MD;  Location: [facility] ORS;  Service: Orthopedics;  Laterality: Right;  . KYPHOPLASTY N/A 08/19/2014   Procedure: KYPHOPLASTY;  Surgeon: Kennedy Bucker, MD;  Location: [facility] ORS;  Service: Orthopedics;  Laterality: N/A;  . PARTIAL HYSTERECTOMY    . RIGHT/LEFT HEART CATH AND CORONARY ANGIOGRAPHY N/A 10/05/2016   Procedure: RIGHT/LEFT HEART CATH AND CORONARY ANGIOGRAPHY;  Surgeon: Kathleene Hazel, MD;  Location: [facility] INVASIVE CV LAB;  Service: Cardiovascular;  Laterality: N/A;  . TUBAL LIGATION      Prior to Admission medications   Medication Sig Start Date End Date Taking? Authorizing Provider  acetaminophen (TYLENOL) 325 MG tablet Take 325 mg by mouth every 6 (six) hours as needed (for pain.).   Yes [provider]  aspirin EC 81 MG tablet Take 81 mg by mouth daily.   Yes [provider]  cholecalciferol (VITAMIN D) 400 units TABS tablet Take 800 Units by mouth daily.    Yes [provider]    vitamin B-12 (CYANOCOBALAMIN) 1000 MCG tablet Take 1,000 mcg by mouth daily.   Yes [provider]  meclizine (ANTIVERT) 25 MG tablet Take 1 tablet (25 mg total) by mouth 3 (three) times daily as needed for dizziness. Patient not taking: Reported on 10/10/2016 09/29/16   Irean Hong, MD    Allergies Penicillins  Family History  Problem Relation Age of Onset  . Heart block Mother   . Heart failure Father   . Heart disease Sister   . Breast cancer Maternal Aunt     Social History Social History  Substance Use Topics  . Smoking status: Never Smoker  . Smokeless tobacco: Never Used  . Alcohol use No    Review of Systems  Constitutional: Negative for fever. Eyes: Negative for visual changes. ENT: Negative for sore throat. Neck: No neck pain  Cardiovascular: Negative for chest pain. Respiratory: Negative for shortness of breath. Gastrointestinal: Negative for abdominal pain, vomiting or diarrhea. + melena Genitourinary: Negative for dysuria. Musculoskeletal: Negative for back pain. Skin: Negative for rash. Neurological: Negative for headaches, weakness or numbness. Psych: No SI or HI  ____________________________________________   PHYSICAL EXAM:  VITAL SIGNS: ED Triage Vitals  Enc Vitals Group     BP 10/10/16 1923 (!) 96/37     Pulse Rate 10/10/16 1923 (!) 103     Resp 10/10/16 1923 (!) 22     Temp 10/10/16 1923 (!) 97.5 F (36.4 C)     Temp Source 10/10/16 1923 Oral     SpO2 10/10/16 1923 100 %     Weight 10/10/16 1924 113 lb (51.3 kg)     Height 10/10/16 1924  (1.651 m)     Head Circumference --      Peak Flow --      Pain Score 10/10/16 1928 10     Pain Loc --      Pain Edu? --      Excl. in GC? --     Constitutional: Alert and oriented, pale, no distress.  HEENT:      Head: Normocephalic and atraumatic.         Eyes: Conjunctivae are normal. Sclera is non-icteric.       Mouth/Throat: Mucous membranes are moist.       Neck: Supple with  no signs of meningismus. Cardiovascular: Tachycardic with regular rhythm. No murmurs, gallops, or rubs. 2+ symmetrical distal pulses are present in all extremities. No JVD. Respiratory: Normal respiratory effort. Lungs are clear to auscultation bilaterally. No wheezes, crackles, or rhonchi.  Gastrointestinal: Soft, non tender, and non distended with positive bowel sounds. No rebound or guarding. rectal exam showing black stool grossly guaiac positive  Musculoskeletal: Nontender with normal range of motion in all extremities. No edema, cyanosis, or erythema of extremities. Neurologic: Normal speech and language. Face is symmetric. Moving all extremities. No gross focal neurologic deficits are appreciated. Skin: Skin is warm, dry and intact. No rash noted. Psychiatric: Mood and affect are normal. Speech and behavior are normal.  ____________________________________________   LABS (all labs ordered are listed, but only abnormal results are displayed)  Labs  Reviewed  CBC WITH DIFFERENTIAL/PLATELET - Abnormal; Notable for the following:       Result Value   RBC 1.93 (*)    Hemoglobin 6.1 (*)    HCT 18.4 (*)    RDW 14.8 (*)    Platelets 147 (*)    Lymphs Abs 0.8 (*)    All other components within normal limits  BASIC METABOLIC PANEL  PROTIME-INR  TYPE AND SCREEN  PREPARE RBC (CROSSMATCH)   ____________________________________________  EKG  ED ECG REPORT I, Nita Sickle, the attending physician, personally viewed and interpreted this ECG.  Sinus tachycardia, rate of 101, normal intervals, normal axis, T-wave inversions in the lateral leads with ST depression in V5 and V6. Unchanged from prior.  ____________________________________________  RADIOLOGY  none  ____________________________________________   PROCEDURES  Procedure(s) performed: None Procedures Critical Care performed:  Yes  CRITICAL CARE Performed by: Nita Sickle  ?  Total critical care time: 35  min  Critical care time was exclusive of separately billable procedures and treating other patients.  Critical care was necessary to treat or prevent imminent or life-threatening deterioration.  Critical care was time spent personally by me on the following activities: development of treatment plan with patient and/or surrogate as well as nursing, discussions with consultants, evaluation of patient's response to treatment, examination of patient, obtaining history from patient or surrogate, ordering and performing treatments and interventions, ordering and review of laboratory studies, ordering and review of radiographic studies, pulse oximetry and re-evaluation of patient's condition.  ____________________________________________   INITIAL IMPRESSION / ASSESSMENT AND PLAN / ED COURSE  83 y.o. female with a history of aortic stenosis, GI bleed, iron deficiency anemia who presents for evaluation of melena x 5 days since being started on aspirin. Patient's BP is low at 96/37 with tachycardia, pulse of 103 concerning for hemodynamically stability. Hemoglobin is 6.1 (11.9, 12 days ago). Patient will be given 1 unit of emergency release blood. 2 more units of being processed. Patient started up her tonics bolus and drip. Will be admitted to the hospitalist service for further management.      As part of my medical decision making, I reviewed the following data within the electronic MEDICAL RECORD NUMBER History obtained from family, Nursing notes reviewed and incorporated, Labs reviewed , EKG interpreted , Old EKG reviewed, Discussed with admitting physician Dr. Enedina Finner, Notes from prior ED visits and [place] Controlled Substance Database    Pertinent labs & imaging results that were available during my care of the patient were reviewed by me and considered in my medical decision making (see chart for details).    ____________________________________________   FINAL CLINICAL IMPRESSION(S) / ED  DIAGNOSES  Final diagnoses:  UGIB (upper gastrointestinal bleed)  Hemorrhagic shock (HCC)      NEW MEDICATIONS STARTED DURING THIS VISIT:  New Prescriptions   No medications on file     Note:  This document was prepared using Dragon voice recognition software and may include unintentional dictation errors.    Nita Sickle, MD 10/10/16 2033

## 2016-10-10 NOTE — ED Triage Notes (Signed)
Pt to triage via w/c, pale; pt st pending pulmonary valve replacement; card cath performed on Friday; began taking ASA week ago, having blood in stools and c/o increasing SHOB and pain "all over"

## 2016-10-11 DIAGNOSIS — E440 Moderate protein-calorie malnutrition: Secondary | ICD-10-CM | POA: Insufficient documentation

## 2016-10-11 DIAGNOSIS — K922 Gastrointestinal hemorrhage, unspecified: Secondary | ICD-10-CM

## 2016-10-11 DIAGNOSIS — D649 Anemia, unspecified: Secondary | ICD-10-CM

## 2016-10-11 DIAGNOSIS — K921 Melena: Principal | ICD-10-CM

## 2016-10-11 LAB — CBC
HEMATOCRIT: 30.2 % — AB (ref 35.0–47.0)
Hemoglobin: 10.4 g/dL — ABNORMAL LOW (ref 12.0–16.0)
MCH: 30.3 pg (ref 26.0–34.0)
MCHC: 34.4 g/dL (ref 32.0–36.0)
MCV: 87.9 fL (ref 80.0–100.0)
PLATELETS: 134 10*3/uL — AB (ref 150–440)
RBC: 3.44 MIL/uL — ABNORMAL LOW (ref 3.80–5.20)
RDW: 18.5 % — AB (ref 11.5–14.5)
WBC: 5.5 10*3/uL (ref 3.6–11.0)

## 2016-10-11 LAB — BASIC METABOLIC PANEL
Anion gap: 5 (ref 5–15)
BUN: 32 mg/dL — AB (ref 6–20)
CALCIUM: 8.3 mg/dL — AB (ref 8.9–10.3)
CO2: 24 mmol/L (ref 22–32)
Chloride: 114 mmol/L — ABNORMAL HIGH (ref 101–111)
Creatinine, Ser: 0.81 mg/dL (ref 0.44–1.00)
GFR calc Af Amer: >60 mL/min (ref >60)
GLUCOSE: 83 mg/dL (ref 65–99)
POTASSIUM: 3.8 mmol/L (ref 3.5–5.1)
SODIUM: 143 mmol/L (ref 135–145)

## 2016-10-11 LAB — PREPARE RBC (CROSSMATCH)

## 2016-10-11 MED ORDER — ACETAMINOPHEN 325 MG PO TABS
650.0000 mg | ORAL_TABLET | Freq: Once | ORAL | Status: AC
  Administered 2016-10-11: 650 mg via ORAL
  Filled 2016-10-11: qty 2

## 2016-10-11 MED ORDER — POTASSIUM CHLORIDE IN NACL 20-0.9 MEQ/L-% IV SOLN
INTRAVENOUS | Status: DC
  Administered 2016-10-11 – 2016-10-12 (×2): via INTRAVENOUS
  Filled 2016-10-11 (×4): qty 1000

## 2016-10-11 MED ORDER — INFLUENZA VAC SPLIT HIGH-DOSE 0.5 ML IM SUSY
0.5000 mL | PREFILLED_SYRINGE | INTRAMUSCULAR | Status: DC
  Filled 2016-10-11: qty 0.5

## 2016-10-11 MED ORDER — PANTOPRAZOLE SODIUM 40 MG IV SOLR
40.0000 mg | Freq: Two times a day (BID) | INTRAVENOUS | Status: DC
  Administered 2016-10-11 – 2016-10-12 (×3): 40 mg via INTRAVENOUS
  Filled 2016-10-11 (×3): qty 40

## 2016-10-11 MED ORDER — ENSURE ENLIVE PO LIQD: 237.0000 mL | Freq: Two times a day (BID) | ORAL | Status: DC

## 2016-10-11 MED ORDER — SODIUM CHLORIDE 0.9 % IV SOLN
Freq: Once | INTRAVENOUS | Status: AC
  Administered 2016-10-11: 02:00:00 via INTRAVENOUS

## 2016-10-11 MED ORDER — ADULT MULTIVITAMIN W/MINERALS CH: 1.0000 | ORAL_TABLET | Freq: Every day | ORAL | Status: DC

## 2016-10-11 NOTE — Telephone Encounter (Signed)
S/w Irving Burton at [facility]. Pt scheduled 10/17 for lumbar epidural steroid injection.  She started  aspirin and experienced melena 5 days later.  Pt was admitted early this morning with a GI bleed. Daughter had called to inquire if she should proceed with injection. I have left a message on daughter's VM to call the office.

## 2016-10-11 NOTE — H&P (Signed)
PCP:   Marguarite Arbour, MD   Chief Complaint:  melena  HPI: This is a 83 year old female with known history of recurrent GI bleeds and severe aortic stenosis. She was started on 81 mg aspirin tablets approximately week ago, by cardiothoracic surgeon, in anticipation of hee aortic valve replacement scheduled tentatively for October 30. On Thursday the patient noted that she had black stools but did not report this to the family. On Friday patient had a heart cath. She finally told the family on Saturday. On Sunday the family called the cardiothoracic surgeon, but as her surgeon was on vacation, this was directed to her gastroenterologist. Her PCP DC the aspirin yesterday and she was told to go to the ER. The patient initially refused. Last night she became very weak and she could not catch her breath. She denies any chest pains. She did have black stools twice today. She finally agreed to come to ER today. In the ER her hemoglobin was 6.1.  As stated patient has history of recurrent GI bleeds. Her last colonoscopy/EGD was boxing to 4 years ago. In the interim she's had to capsule studies, all equally is unrevealing.  History provided by the patient as well as family present at bedside.  Review of Systems:  The patient denies anorexia, fever, weight loss,, vision loss, decreased hearing, hoarseness, chest pain, syncope, dyspnea on exertion, peripheral edema, balance deficits, hemoptysis,weakness, abdominal pain, melena, hematochezia, severe indigestion/heartburn, melena, hematuria, incontinence, genital sores, muscle weakness, suspicious skin lesions, transient blindness, difficulty walking, depression, unusual weight change, abnormal bleeding, enlarged lymph nodes, angioedema, and breast masses.  Past Medical History: Past Medical History:  Diagnosis Date  . Aortic stenosis    a. TTE 8/17: nl EF, mod to sev AS w/ mean gradient 28 mmHg, valve area 0.97, mod pulm HTN, small pericardial effusion   . Arrhythmia   . Asthma   . AVM (arteriovenous malformation)    a. small intestine   . Broken back   . Broken wrist   . Hip fracture (HCC)    a. in the setting of mechanical fall  . Iron deficiency anemia    a. requiring periodic transfusions  . Syncope and collapse    Past Surgical History:  Procedure Laterality Date  . APPENDECTOMY    . BACK SURGERY     back fusion  . ESOPHAGOGASTRODUODENOSCOPY (EGD) WITH PROPOFOL N/A 11/15/2015   Procedure: ESOPHAGOGASTRODUODENOSCOPY (EGD) WITH PROPOFOL;  Surgeon: [name], MD;  Location: [facility] ENDOSCOPY;  Service: Endoscopy;  Laterality: N/A;  . FRACTURE SURGERY    . GIVENS CAPSULE STUDY N/A 02/20/2016   Procedure: GIVENS CAPSULE STUDY;  Surgeon: Scot Jun, MD;  Location: [facility] ENDOSCOPY;  Service: Endoscopy;  Laterality: N/A;  . HIP FRACTURE SURGERY    . INTRAMEDULLARY (IM) NAIL INTERTROCHANTERIC Right 11/18/2015   Procedure: INTRAMEDULLARY (IM) NAIL INTERTROCHANTRIC;  Surgeon: Juanell Fairly, MD;  Location: [facility] ORS;  Service: Orthopedics;  Laterality: Right;  . KYPHOPLASTY N/A 08/19/2014   Procedure: KYPHOPLASTY;  Surgeon: Kennedy Bucker, MD;  Location: [facility] ORS;  Service: Orthopedics;  Laterality: N/A;  . PARTIAL HYSTERECTOMY    . RIGHT/LEFT HEART CATH AND CORONARY ANGIOGRAPHY N/A 10/05/2016   Procedure: RIGHT/LEFT HEART CATH AND CORONARY ANGIOGRAPHY;  Surgeon: Kathleene Hazel, MD;  Location: [facility] INVASIVE CV LAB;  Service: Cardiovascular;  Laterality: N/A;  . TUBAL LIGATION      Medications: Prior to Admission medications   Medication Sig Start Date End Date Taking? Authorizing Provider  acetaminophen (TYLENOL) 325  MG tablet Take 325 mg by mouth every 6 (six) hours as needed (for pain.).   Yes [provider]  aspirin EC 81 MG tablet Take 81 mg by mouth daily.   Yes [provider]  cholecalciferol (VITAMIN D) 400 units TABS tablet Take 800 Units by mouth daily.    Yes [provider]  vitamin  B-12 (CYANOCOBALAMIN) 1000 MCG tablet Take 1,000 mcg by mouth daily.   Yes [provider]  meclizine (ANTIVERT) 25 MG tablet Take 1 tablet (25 mg total) by mouth 3 (three) times daily as needed for dizziness. Patient not taking: Reported on 10/10/2016 09/29/16   Irean Hong, MD    Allergies:   Allergies  Allergen Reactions  . Penicillins Rash and Other (See Comments)    Antibiotics Has patient had a PCN reaction causing immediate rash, facial/tongue/throat swelling, SOB or lightheadedness with hypotension: Unknown Has patient had a PCN reaction causing severe rash involving mucus membranes or skin necrosis: Unknown Has patient had a PCN reaction that required hospitalization: Unknown Has patient had a PCN reaction occurring within the last 10 years: No Childhood reaction  If all of the above answers are "NO", then may proceed with Cephalosporin use.     Social History:  reports that she has never smoked. She has never used smokeless tobacco. She reports that she does not drink alcohol or use drugs.  Family History: Family History  Problem Relation Age of Onset  . Heart block Mother   . Heart failure Father   . Heart disease Sister   . Breast cancer Maternal Aunt     Physical Exam: Vitals:   10/10/16 2215 10/10/16 2230 10/10/16 2245 10/10/16 2300  BP: 129/61 119/65 122/61 122/64  Pulse: 96 99 96 93  Resp: Temp:      TempSrc:      SpO2: 100% 100% 100% 100%  Weight:      Height:        General:  Alert and oriented times three, well developed and nourished, no acute distress Eyes: PERRLA, pink conjunctiva, no scleral icterus ENT: Moist oral mucosa, neck supple, no thyromegaly Lungs: clear to ascultation, no wheeze, no crackles, no use of accessory muscles Cardiovascular: regular rate and rhythm, no regurgitation, no gallops, no murmurs. No carotid bruits, no JVD Abdomen: soft, positive BS, non-tender, non-distended, no organomegaly, not an acute  abdomen GU: not examined Neuro: CN II - XII grossly intact, sensation intact Musculoskeletal: strength 5/5 all extremities, no clubbing, cyanosis or edema Skin: no rash, no subcutaneous crepitation, no decubitus Psych: appropriate patient   Labs on Admission:   Recent Labs  10/10/16 1953  NA 140  K 3.7  CL 110  CO2 22  GLUCOSE 99  BUN 48*  CREATININE 1.15*  CALCIUM 8.7*   No results for input(s): AST, ALT, ALKPHOS, BILITOT, PROT, ALBUMIN in the last 72 hours. No results for input(s): LIPASE, AMYLASE in the last 72 hours.  Recent Labs  10/10/16 1953  WBC 5.8  NEUTROABS 4.6  HGB 6.1*  HCT 18.4*  MCV 95.4  PLT 147*   No results for input(s): CKTOTAL, CKMB, CKMBINDEX, TROPONINI in the last 72 hours. Invalid input(s): POCBNP No results for input(s): DDIMER in the last 72 hours. No results for input(s): HGBA1C in the last 72 hours. No results for input(s): CHOL, HDL, LDLCALC, TRIG, CHOLHDL, LDLDIRECT in the last 72 hours. No results for input(s): TSH, T4TOTAL, T3FREE, THYROIDAB in the last 72  hours.  Invalid input(s): FREET3 No results for input(s): VITAMINB12, FOLATE, FERRITIN, TIBC, IRON, RETICCTPCT in the last 72 hours.  Micro Results: No results found for this or any previous visit (from the past 240 hour(s)).   Radiological Exams on Admission: No results found.  Assessment/Plan Present on Admission: . GIB (gastrointestinal bleeding) -Admit to MedSurg with monitoring -Transfused a total of 3 units packed red blood cells for now, repeat CBC post transfusion. Old H&H of 10 -DC ASA -Continue IV Protonix -GI consult to not place, will defer to AM team given the chronicity of patient recurrent bleed and rePEATED negative workup  . Anemia -See above  AS -Per cardiothoracic surgery  . Lichen sclerosus -   Nneka Blanda 10/11/2016, 12:08 AM

## 2016-10-11 NOTE — Consult Note (Signed)
[name], MD [facility]  [address], [address] [place], [place] [address] Phone: [phone] Fax : [phone]  Consultation  Referring Provider:     Dr. Imogene Burn  Primary Care Physician:  Marguarite Arbour, MD Primary Gastroenterologist:  Dr. Mechele Collin         Reason for Consultation:     Anemia  Date of Admission:  10/10/2016 Date of Consultation:  10/11/2016         HPI:   Kelsey Le is a 83 y.o. female Who has a history of AVMs and severe aortic stenosis.  The patient is being evaluated for a aortic valve replacement and was started on 81 mg of aspirin approximately a week ago.  The patient has been having black stools and was found to have a hemoglobin of 6.1.  The patient had been feeling poorly and called her cardiothoracic surgeon who is on vacation and she was told to contact her gastroenterologist.  The patient called Dr. Mechele Collin and was admitted to the hospital.  The patient reports that prior to coming to the emergency room she was feeling weak and unable to catch her breath.  The patient had a capsule endoscopy that showed her to have small bowel AVMs. It also appears that I did a upper endoscopy on her back in November 2017.  This was done as an inpatient for melena.  At that time the upper endoscopy was normal. The patient was transfused and her hemoglobin this morning was 10.4.  Past Medical History:  Diagnosis Date  . Aortic stenosis    a. TTE 8/17: nl EF, mod to sev AS w/ mean gradient 28 mmHg, valve area 0.97, mod pulm HTN, small pericardial effusion  . Arrhythmia   . Asthma   . AVM (arteriovenous malformation)    a. small intestine   . Broken back   . Broken wrist   . Hip fracture (HCC)    a. in the setting of mechanical fall  . Iron deficiency anemia    a. requiring periodic transfusions  . Syncope and collapse     Past Surgical History:  Procedure Laterality Date  . APPENDECTOMY    . BACK SURGERY     back fusion  . ESOPHAGOGASTRODUODENOSCOPY (EGD) WITH  PROPOFOL N/A 11/15/2015   Procedure: ESOPHAGOGASTRODUODENOSCOPY (EGD) WITH PROPOFOL;  Surgeon: [name], MD;  Location: [facility] ENDOSCOPY;  Service: Endoscopy;  Laterality: N/A;  . FRACTURE SURGERY    . GIVENS CAPSULE STUDY N/A 02/20/2016   Procedure: GIVENS CAPSULE STUDY;  Surgeon: Scot Jun, MD;  Location: [facility] ENDOSCOPY;  Service: Endoscopy;  Laterality: N/A;  . HIP FRACTURE SURGERY    . INTRAMEDULLARY (IM) NAIL INTERTROCHANTERIC Right 11/18/2015   Procedure: INTRAMEDULLARY (IM) NAIL INTERTROCHANTRIC;  Surgeon: Juanell Fairly, MD;  Location: [facility] ORS;  Service: Orthopedics;  Laterality: Right;  . KYPHOPLASTY N/A 08/19/2014   Procedure: KYPHOPLASTY;  Surgeon: Kennedy Bucker, MD;  Location: [facility] ORS;  Service: Orthopedics;  Laterality: N/A;  . PARTIAL HYSTERECTOMY    . RIGHT/LEFT HEART CATH AND CORONARY ANGIOGRAPHY N/A 10/05/2016   Procedure: RIGHT/LEFT HEART CATH AND CORONARY ANGIOGRAPHY;  Surgeon: Kathleene Hazel, MD;  Location: [facility] INVASIVE CV LAB;  Service: Cardiovascular;  Laterality: N/A;  . TUBAL LIGATION      Prior to Admission medications   Medication Sig Start Date End Date Taking? Authorizing Provider  acetaminophen (TYLENOL) 325 MG tablet Take 325 mg by mouth every 6 (six) hours as needed (for pain.).   Yes [provider]  aspirin EC 81 MG tablet Take 81 mg by mouth daily.   Yes [provider]  cholecalciferol (VITAMIN D) 400 units TABS tablet Take 800 Units by mouth daily.    Yes [provider]  vitamin B-12 (CYANOCOBALAMIN) 1000 MCG tablet Take 1,000 mcg by mouth daily.   Yes [provider]  meclizine (ANTIVERT) 25 MG tablet Take 1 tablet (25 mg total) by mouth 3 (three) times daily as needed for dizziness. Patient not taking: Reported on 10/10/2016 09/29/16   Irean Hong, MD    Family History  Problem Relation Age of Onset  . Heart block Mother   . Heart failure Father   . Heart disease Sister   . Breast cancer Maternal  Aunt      Social History  Substance Use Topics  . Smoking status: Never Smoker  . Smokeless tobacco: Never Used  . Alcohol use No    Allergies as of 10/10/2016 - Review Complete 10/10/2016  Allergen Reaction Noted  . Penicillins Rash and Other (See Comments) 04/24/2013    Review of Systems:    All systems reviewed and negative except where noted in HPI.   Physical Exam:  Vital signs in last 24 hours: Temp:  [97.8 F (36.6 C)-98.6 F (37 C)] 98.4 F (36.9 C) (10/11 1349) Pulse Rate:  [65-108] 75 (10/11 1350) Resp:  [13-23] 16 (10/11 0839) BP: (103-130)/(39-65) 123/42 (10/11 1350) SpO2:  [94 %-100 %] 96 % (10/11 1350)   General:   Pleasant, cooperative in NAD Head:  Normocephalic and atraumatic. Eyes:   No icterus.   Conjunctiva pink. PERRLA. Ears:  Normal auditory acuity. Neck:  Supple; no masses or thyroidomegaly Lungs: Respirations even and unlabored. Lungs clear to auscultation bilaterally.   No wheezes, crackles, or rhonchi.  Heart:  Regular rate and rhythm;  Without murmur, clicks, rubs or gallops Abdomen:  Soft, nondistended, nontender. Normal bowel sounds. No appreciable masses or hepatomegaly.  No rebound or guarding.  Rectal:  Not performed. Msk:  Symmetrical without gross deformities.    Extremities:  Without edema, cyanosis or clubbing. Neurologic:  Alert and oriented x3;  grossly normal neurologically Except for being hard of hearing. Skin:  Intact without significant lesions or rashes. Cervical Nodes:  No significant cervical adenopathy. Psych:  Alert and cooperative. Normal affect.  LAB RESULTS:  Recent Labs  10/10/16 1953 10/11/16 1314  WBC 5.8 5.5  HGB 6.1* 10.4*  HCT 18.4* 30.2*  PLT 147* 134*   BMET  Recent Labs  10/10/16 1953 10/11/16 1314  NA 140 143  K 3.7 3.8  CL 110 114*  CO2 22 24  GLUCOSE 99 83  BUN 48* 32*  CREATININE 1.15* 0.81  CALCIUM 8.7* 8.3*   LFT No results for input(s): PROT, ALBUMIN, AST, ALT, ALKPHOS, BILITOT,  BILIDIR, IBILI in the last 72 hours. PT/INR  Recent Labs  10/10/16 1953  LABPROT 13.8  INR 1.07    STUDIES: No results found.    Impression / Plan:   SOLEDAD BUDREAU is a 83 y.o. y/o female with a history of recurrent GI bleeds who is now being evaluated for possible aortic valve replacement.  The patient's anemia is of concern due to her advanced age and need for valve replacement. The patient will be set up for an EGD for tomorrow to look for the cause of her black stools. I have discussed risks & benefits which include, but are not limited to, bleeding, infection, perforation & drug reaction.  The patient agrees with this plan & written consent will be obtained.     Thank you for involving me in the care of this patient.      LOS: 1 day   [name], MD  10/11/2016, 7:38 PM   Note: This dictation was prepared with Dragon dictation along with smaller phrase technology. Any transcriptional errors that result from this process are unintentional.

## 2016-10-11 NOTE — Progress Notes (Addendum)
[employer] [facility] [place] [facility]   PATIENT NAME: Kelsey Le    MR#:  [id number]  DATE OF BIRTH:  August 20, 1930  SUBJECTIVE:  CHIEF COMPLAINT:   Chief Complaint  Patient presents with  . Shortness of Breath   SOB improved. No active bleeding or melena. S/p 3 units PRBC transfusion. REVIEW OF SYSTEMS:  Review of Systems  Constitutional: Positive for malaise/fatigue. Negative for chills and fever.  HENT: Negative for sore throat.   Eyes: Negative for blurred vision and double vision.  Respiratory: Negative for cough, hemoptysis, shortness of breath, wheezing and stridor.   Cardiovascular: Negative for chest pain, palpitations, orthopnea and leg swelling.  Gastrointestinal: Negative for abdominal pain, blood in stool, diarrhea, melena, nausea and vomiting.  Genitourinary: Negative for dysuria, flank pain and hematuria.  Musculoskeletal: Negative for back pain and joint pain.  Skin: Negative for rash.  Neurological: Positive for weakness. Negative for dizziness, sensory change, focal weakness, seizures, loss of consciousness and headaches.  Endo/Heme/Allergies: Negative for polydipsia.  Psychiatric/Behavioral: Negative for depression. The patient is not nervous/anxious.     DRUG ALLERGIES:   Allergies  Allergen Reactions  . Penicillins Rash and Other (See Comments)    Antibiotics Has patient had a PCN reaction causing immediate rash, facial/tongue/throat swelling, SOB or lightheadedness with hypotension: Unknown Has patient had a PCN reaction causing severe rash involving mucus membranes or skin necrosis: Unknown Has patient had a PCN reaction that required hospitalization: Unknown Has patient had a PCN reaction occurring within the last 10 years: No Childhood reaction  If all of the above answers are "NO", then may proceed with Cephalosporin use.    VITALS:  Blood pressure (!) 123/42, pulse 75, temperature 98.4 F (36.9 C), temperature source Oral,  resp. rate 16, height  (1.651 m), weight 113 lb (51.3 kg), SpO2 96 %. PHYSICAL EXAMINATION:  Physical Exam  Constitutional: She is oriented to person, place, and time.  Malnutrition.  HENT:  Head: Normocephalic.  Mouth/Throat: Oropharynx is clear and moist.  Eyes: Pupils are equal, round, and reactive to light. Conjunctivae and EOM are normal. No scleral icterus.  Neck: Normal range of motion. Neck supple. No JVD present. No tracheal deviation present.  Cardiovascular: Normal rate, regular rhythm and normal heart sounds.  Exam reveals no gallop.   No murmur heard. Pulmonary/Chest: Effort normal and breath sounds normal. No respiratory distress. She has no wheezes. She has no rales.  Abdominal: Soft. Bowel sounds are normal. She exhibits no distension. There is no tenderness. There is no rebound.  Musculoskeletal: Normal range of motion. She exhibits no edema or tenderness.  Neurological: She is alert and oriented to person, place, and time. No cranial nerve deficit.  Skin: No rash noted. No erythema.  Psychiatric: Affect normal.   LABORATORY PANEL:  Female CBC  Recent Labs Lab 10/11/16 1314  WBC 5.5  HGB 10.4*  HCT 30.2*  PLT 134*   ------------------------------------------------------------------------------------------------------------------ Chemistries   Recent Labs Lab 10/11/16 1314  NA 143  K 3.8  CL 114*  CO2 24  GLUCOSE 83  BUN 32*  CREATININE 0.81  CALCIUM 8.3*   RADIOLOGY:  No results found. ASSESSMENT AND PLAN:   . GIB (gastrointestinal bleeding), history of AVM (arteriovenous malformation). Hold ASA -Continue IV Protonix NPO with IVF, GI consult.  . Anemia due to acute blood loss. S/p 3 units PRBC transfusion. Hb is 10.4, f/u Hb in am.  AS F/u cardiothoracic surgeon as outpatient.  moderate malnutrition in context  of chronic illness F/u dietitian.  . Lichen sclerosus  Generalized weakness. PT I discussed with cardiology NP and he  will let Dr. Kirke Corin know, and see the patient. All the records are reviewed and case discussed with Care Management/Social Worker. Management plans discussed with the patient, her daughter and they are in agreement.  CODE STATUS: Full Code  TOTAL TIME TAKING CARE OF THIS PATIENT: 37 minutes.   More than 50% of the time was spent in counseling/coordination of care: YES  POSSIBLE D/C IN 1-2 DAYS, DEPENDING ON CLINICAL CONDITION.   Shaune Pollack M.D on 10/11/2016 at 4:01 PM  Between 7am to 6pm - Pager - [phone]  After 6pm go to [email or web address] - Therapist, nutritional Hospitalists

## 2016-10-11 NOTE — Progress Notes (Signed)
Primary nurse paged and spoke to Dr. Joneen Roach to clarify  pt Blood Transfusion order along with pt Protonix order. Orders were clarified for pt to received 3 units of PRBC and for pt protonix drip to stop and for pt to received protonix 40 mg IV BID starting in AM. Primary nurse to continue to monitor.

## 2016-10-11 NOTE — Progress Notes (Signed)
Initial Nutrition Assessment  DOCUMENTATION CODES:   Non-severe (moderate) malnutrition in context of chronic illness  INTERVENTION:   Pt may benefit from daily Calcium supplementation as she has suffered from multiple recent fractures including her hip and back. Pt already takes Vitamin D3 daily.   Ensure Enlive po BID, each supplement provides 350 kcal and 20 grams of protein  MVI daily  Liberalize diet   Snacks   NUTRITION DIAGNOSIS:   Malnutrition (moderate) related to chronic illness (multiple surgeries, advanced age, heart disease) as evidenced by moderate depletion of body fat, moderate depletions of muscle mass, 9 percent weight loss in 8 months.  GOAL:   Patient will meet greater than or equal to 90% of their needs  MONITOR:   PO intake, Supplement acceptance, Labs, Weight trends  REASON FOR ASSESSMENT:   Other (Comment) (low BMI)    ASSESSMENT:   83 y.o. female with a history of aortic stenosis, GI bleed, iron deficiency anemia who presents for evaluation of melena x 5 days since being started on aspirin.. Pt s/p cardiac cath on 10/5   Met with pt and pt's family in room today. Pt reports her normal appetite pta which family reports is multiple small meals throughout the day. It appears that pt follows a very restrictive diet at home. She reports that she avoids all nuts and seeds because her doctor told her this will increase risk for bleeding. She avoids meats as she believes red meat is not healthy; she does eat some chicken but very little. Pt also believes that she is allergic to many foods and when asked what "reaction" she has she states that foods "upset her stomach". Daughter at bedside reports that pt is very restrictive and that she is not allergic to these foods. Pt does drink Ensure occasionally. Pt also reports that she takes vitamin B-12 and Vitamin D3 daily. Pt also reports that she gets iron injections occasionally at her doctors office. Per chart,  pt's last iron in May was normal. Pt denies any trouble chewing or swallowing but does reports a hiatal hernia. Per chart, pt has lost 11lbs(9%) in the past 8 months; this is significant. Pt reports that she broke her hip in November and she lost 14lbs around that time. Pt also reports that she broke her back at one point and she did not eat well during that time either. Pt may benefit from daily calcium supplementation as she avoids most dairy. Pt is currently NPO for GI consult. Pt upset that she could not have butter on her tray; RD will liberalize diet when advanced and order snacks.       Medications reviewed and include: protonix  Labs reviewed: BUN 48(H), creat 1.15(H), Ca 8.7(L) Iron 68- 05/2016 hgb 6.1(L), Hct 18.4(L)  Nutrition-Focused physical exam completed. Findings are moderate fat depletions in orbital regions, arms, and chest, moderate muscle depletions in temporal regions, hands, and clavicles, and no edema.   Diet Order:  Diet NPO time specified  Skin:  Reviewed, no issues  Last BM:  pt reports black, tar stools since 10/4  Height:   Ht Readings from Last 1 Encounters:  10/10/16 5' 5" (1.651 m)    Weight:   Wt Readings from Last 1 Encounters:  10/10/16 113 lb (51.3 kg)    Ideal Body Weight:  56.8 kg  BMI:  Body mass index is 18.8 kg/m.  Estimated Nutritional Needs:   Kcal:  1400-1700kcal/day   Protein:  77-87g/day   Fluid:  >1.4L/day  EDUCATION NEEDS:   Education needs addressed  Koleen Distance MS, RD, LDN Pager #[phone] After Hours Pager: [phone]

## 2016-10-11 NOTE — Telephone Encounter (Signed)
New Message ° ° pt daughter verbalized that she is returning call for rn  °

## 2016-10-12 ENCOUNTER — Inpatient Hospital Stay: Payer: Medicare Other | Admitting: Anesthesiology

## 2016-10-12 ENCOUNTER — Encounter (HOSPITAL_COMMUNITY): Payer: Medicare Other

## 2016-10-12 ENCOUNTER — Ambulatory Visit (HOSPITAL_COMMUNITY): Payer: Medicare Other

## 2016-10-12 ENCOUNTER — Encounter
Admission: EM | Disposition: A | Discharge status: Home / Self Care | Payer: Self-pay | Source: Home / Self Care | Attending: Internal Medicine

## 2016-10-12 ENCOUNTER — Encounter: Payer: Self-pay | Admitting: *Deleted

## 2016-10-12 DIAGNOSIS — K921 Melena: Secondary | ICD-10-CM

## 2016-10-12 DIAGNOSIS — K2970 Gastritis, unspecified, without bleeding: Secondary | ICD-10-CM

## 2016-10-12 DIAGNOSIS — K449 Diaphragmatic hernia without obstruction or gangrene: Secondary | ICD-10-CM

## 2016-10-12 HISTORY — PX: ESOPHAGOGASTRODUODENOSCOPY (EGD) WITH PROPOFOL: SHX5813

## 2016-10-12 LAB — TYPE AND SCREEN
ABO/RH(D): A POS
ANTIBODY SCREEN: NEGATIVE
UNIT DIVISION: 0
UNIT DIVISION: 0
Unit division: 0

## 2016-10-12 LAB — BPAM RBC
BLOOD PRODUCT EXPIRATION DATE: 201810172359
BLOOD PRODUCT EXPIRATION DATE: 201810232359
Blood Product Expiration Date: 201810222359
ISSUE DATE / TIME: 201810110548
ISSUE DATE / TIME: 201810102030
ISSUE DATE / TIME: 201810110149
UNIT TYPE AND RH: 5100
UNIT TYPE AND RH: 5100
Unit Type and Rh: 6200

## 2016-10-12 LAB — HEMOGLOBIN: HEMOGLOBIN: 10 g/dL — AB (ref 12.0–16.0)

## 2016-10-12 SURGERY — ESOPHAGOGASTRODUODENOSCOPY (EGD) WITH PROPOFOL
Anesthesia: General

## 2016-10-12 MED ORDER — PANTOPRAZOLE SODIUM 40 MG PO TBEC: 40.0000 mg | DELAYED_RELEASE_TABLET | Freq: Every day | ORAL | Status: DC

## 2016-10-12 MED ORDER — PANTOPRAZOLE SODIUM 40 MG PO TBEC: 40.0000 mg | DELAYED_RELEASE_TABLET | Freq: Every day | ORAL | 0 refills | Status: DC

## 2016-10-12 MED ORDER — PROPOFOL 500 MG/50ML IV EMUL
INTRAVENOUS | Status: DC | PRN
  Administered 2016-10-12: 100 ug/kg/min via INTRAVENOUS

## 2016-10-12 MED ORDER — ACETAMINOPHEN 325 MG PO TABS
650.0000 mg | ORAL_TABLET | Freq: Four times a day (QID) | ORAL | Status: DC | PRN
  Administered 2016-10-12: 650 mg via ORAL
  Filled 2016-10-12: qty 2

## 2016-10-12 MED ORDER — PROPOFOL 10 MG/ML IV BOLUS
INTRAVENOUS | Status: AC
  Filled 2016-10-12: qty 20

## 2016-10-12 MED ORDER — LIDOCAINE HCL (CARDIAC) 20 MG/ML IV SOLN
INTRAVENOUS | Status: DC | PRN
  Administered 2016-10-12 (×2): 40 mg via INTRAVENOUS

## 2016-10-12 MED ORDER — SODIUM CHLORIDE 0.9 % IV SOLN
INTRAVENOUS | Status: DC
  Administered 2016-10-12: 1000 mL via INTRAVENOUS

## 2016-10-12 NOTE — Anesthesia Preprocedure Evaluation (Signed)
Anesthesia Evaluation  Patient identified by MRN, date of birth, ID band Patient awake    Reviewed: Allergy & Precautions, NPO status , Patient's Chart, lab work & pertinent test results  Airway Mallampati: II       Dental   Pulmonary asthma ,           Cardiovascular (-) hypertension(-) Past MI and (-) CHF (-) dysrhythmias + Valvular Problems/Murmurs AS      Neuro/Psych    GI/Hepatic Neg liver ROS, neg GERD  ,  Endo/Other  neg diabetes  Renal/GU negative Renal ROS     Musculoskeletal   Abdominal   Peds  Hematology  (+) anemia ,   Anesthesia Other Findings   Reproductive/Obstetrics                             Anesthesia Physical Anesthesia Plan  ASA: III  Anesthesia Plan: General   Post-op Pain Management:    Induction: Intravenous  PONV Risk Score and Plan:   Airway Management Planned: Nasal Cannula  Additional Equipment:   Intra-op Plan:   Post-operative Plan:   Informed Consent: I have reviewed the patients History and Physical, chart, labs and discussed the procedure including the risks, benefits and alternatives for the proposed anesthesia with the patient or authorized representative who has indicated his/her understanding and acceptance.     Plan Discussed with:   Anesthesia Plan Comments: (Discussed risks with pt. Cardiac surgeon unwilling to proceed with valve replacement until GI source of bleeding discovered.)        Anesthesia Quick Evaluation

## 2016-10-12 NOTE — Anesthesia Postprocedure Evaluation (Signed)
Anesthesia Post Note  Patient: Kelsey Le  Procedure(s) Performed: ESOPHAGOGASTRODUODENOSCOPY (EGD) WITH PROPOFOL (N/A )  Patient location during evaluation: PACU Anesthesia Type: General Level of consciousness: awake and alert Pain management: pain level controlled Vital Signs Assessment: post-procedure vital signs reviewed and stable Respiratory status: spontaneous breathing and respiratory function stable Cardiovascular status: stable Anesthetic complications: no     Last Vitals:  Vitals:   10/12/16 1336 10/12/16 1433  BP: (!) 136/58 134/62  Pulse: 88 87  Resp: 20 12  Temp: (!) 35.7 C 37.4 C  SpO2: 100% 100%    Last Pain:  Vitals:   10/12/16 1433  TempSrc: Tympanic  PainSc: 0-No pain                 KEPHART,WILLIAM K

## 2016-10-12 NOTE — Discharge Summary (Signed)
[employer] [facility] -  at [facility]   PATIENT NAME: Kelsey Le    MR#:  [id number]  DATE OF BIRTH:  04/22/1930  DATE OF ADMISSION:  10/10/2016   ADMITTING PHYSICIAN: Gery Pray, MD  DATE OF DISCHARGE: 10/12/2016 PRIMARY CARE PHYSICIAN: Marguarite Arbour, MD   ADMISSION DIAGNOSIS:  Hemorrhagic shock (HCC) [R57.8] UGIB (upper gastrointestinal bleed) [K92.2] DISCHARGE DIAGNOSIS:  Active Problems:   Aortic stenosis   Lichen sclerosus   GIB (gastrointestinal bleeding)   Anemia   Malnutrition of moderate degree   UGIB (upper gastrointestinal bleed)   Blood in stool   Gastritis without bleeding  SECONDARY DIAGNOSIS:   Past Medical History:  Diagnosis Date  . Aortic stenosis    a. TTE 8/17: nl EF, mod to sev AS w/ mean gradient 28 mmHg, valve area 0.97, mod pulm HTN, small pericardial effusion  . Arrhythmia   . Asthma   . AVM (arteriovenous malformation)    a. small intestine   . Broken back   . Broken wrist   . Hip fracture (HCC)    a. in the setting of mechanical fall  . Iron deficiency anemia    a. requiring periodic transfusions  . Syncope and collapse    HOSPITAL COURSE:  . GIB (gastrointestinal bleeding), history of AVM (arteriovenous malformation). Hold ASA She is treated with IV Protonix EGD today: gastritis. Changed to by mouth Protonix daily.  . Anemia due to acute blood loss. S/p 3 units PRBC transfusion. Hb is stable at 10.  AS F/u cardiothoracic surgeon as outpatient.  moderate malnutrition in context of chronic illness F/u dietitian.  . Lichen sclerosus  Generalized weakness. PT  DISCHARGE CONDITIONS:  Stable, discharge to home today. CONSULTS OBTAINED:  Treatment Team:  [name], MD DRUG ALLERGIES:   Allergies  Allergen Reactions  . Penicillins Rash and Other (See Comments)    Antibiotics Has patient had a PCN reaction causing immediate rash, facial/tongue/throat swelling, SOB or lightheadedness with  hypotension: Unknown Has patient had a PCN reaction causing severe rash involving mucus membranes or skin necrosis: Unknown Has patient had a PCN reaction that required hospitalization: Unknown Has patient had a PCN reaction occurring within the last 10 years: No Childhood reaction  If all of the above answers are "NO", then may proceed with Cephalosporin use.    DISCHARGE MEDICATIONS:   Allergies as of 10/12/2016      Reactions   Penicillins Rash, Other (See Comments)   Antibiotics Has patient had a PCN reaction causing immediate rash, facial/tongue/throat swelling, SOB or lightheadedness with hypotension: Unknown Has patient had a PCN reaction causing severe rash involving mucus membranes or skin necrosis: Unknown Has patient had a PCN reaction that required hospitalization: Unknown Has patient had a PCN reaction occurring within the last 10 years: No Childhood reaction  If all of the above answers are "NO", then may proceed with Cephalosporin use.      Medication List    STOP taking these medications   aspirin EC 81 MG tablet     TAKE these medications   acetaminophen 325 MG tablet Commonly known as:  TYLENOL Take 325 mg by mouth every 6 (six) hours as needed (for pain.).   cholecalciferol 400 units Tabs tablet Commonly known as:  VITAMIN D Take 800 Units by mouth daily.   meclizine 25 MG tablet Commonly known as:  ANTIVERT Take 1 tablet (25 mg total) by mouth 3 (three) times daily as needed for dizziness.   pantoprazole  40 MG tablet Commonly known as:  PROTONIX Take 1 tablet (40 mg total) by mouth daily.   vitamin B-12 1000 MCG tablet Commonly known as:  CYANOCOBALAMIN Take 1,000 mcg by mouth daily.        DISCHARGE INSTRUCTIONS:  See AVS.  If you experience worsening of your admission symptoms, develop shortness of breath, life threatening emergency, suicidal or homicidal thoughts you must seek medical attention immediately by calling 911 or calling your  MD immediately  if symptoms less severe.  You Must read complete instructions/literature along with all the possible adverse reactions/side effects for all the Medicines you take and that have been prescribed to you. Take any new Medicines after you have completely understood and accpet all the possible adverse reactions/side effects.   Please note  You were cared for by a hospitalist during your hospital stay. If you have any questions about your discharge medications or the care you received while you were in the hospital after you are discharged, you can call the unit and asked to speak with the hospitalist on call if the hospitalist that took care of you is not available. Once you are discharged, your primary care physician will handle any further medical issues. Please note that NO REFILLS for any discharge medications will be authorized once you are discharged, as it is imperative that you return to your primary care physician (or establish a relationship with a primary care physician if you do not have one) for your aftercare needs so that they can reassess your need for medications and monitor your lab values.    On the day of Discharge:  VITAL SIGNS:  Blood pressure (!) 149/60, pulse 85, temperature (!) 97.1 F (36.2 C), temperature source Tympanic, resp. rate 20, height  (1.651 m), weight 113 lb (51.3 kg), SpO2 100 %. PHYSICAL EXAMINATION:  GENERAL:  83 y.o.-year-old patient lying in the bed with no acute distress.  EYES: Pupils equal, round, reactive to light and accommodation. No scleral icterus. Extraocular muscles intact.  HEENT: Head atraumatic, normocephalic. Oropharynx and nasopharynx clear.  NECK:  Supple, no jugular venous distention. No thyroid enlargement, no tenderness.  LUNGS: Normal breath sounds bilaterally, no wheezing, rales,rhonchi or crepitation. No use of accessory muscles of respiration.  CARDIOVASCULAR: S1, S2 normal. No murmurs, rubs, or gallops.  ABDOMEN:  Soft, non-tender, non-distended. Bowel sounds present. No organomegaly or mass.  EXTREMITIES: No pedal edema, cyanosis, or clubbing.  NEUROLOGIC: Cranial nerves II through XII are intact. Muscle strength 5/5 in all extremities. Sensation intact. Gait not checked.  PSYCHIATRIC: The patient is alert and oriented x 3.  SKIN: No obvious rash, lesion, or ulcer.  DATA REVIEW:   CBC  Recent Labs Lab 10/11/16 1314 10/12/16 0329  WBC 5.5  --   HGB 10.4* 10.0*  HCT 30.2*  --   PLT 134*  --     Chemistries   Recent Labs Lab 10/11/16 1314  NA 143  K 3.8  CL 114*  CO2 24  GLUCOSE 83  BUN 32*  CREATININE 0.81  CALCIUM 8.3*     Microbiology Results  Results for orders placed or performed during the hospital encounter of 11/17/15  Urine culture     Status: None   Collection Time: 11/17/15  8:09 PM  Result Value Ref Range Status   Specimen Description URINE, RANDOM  Final   Special Requests NONE  Final   Culture NO GROWTH Performed at [facility]   Final   Report Status  11/19/2015 FINAL  Final  MRSA PCR Screening     Status: None   Collection Time: 11/17/15 10:59 PM  Result Value Ref Range Status   MRSA by PCR NEGATIVE NEGATIVE Final    Comment:        The GeneXpert MRSA Assay (FDA approved for NASAL specimens only), is one component of a comprehensive MRSA colonization surveillance program. It is not intended to diagnose MRSA infection nor to guide or monitor treatment for MRSA infections.     RADIOLOGY:  No results found.   Management plans discussed with the patient, family and they are in agreement.  CODE STATUS: Full Code   TOTAL TIME TAKING CARE OF THIS PATIENT: 35 minutes.    Shaune Pollack M.D on 10/12/2016 at 3:29 PM  Between 7am to 6pm - Pager - [phone]  After 6pm go to [email or web address] - Social research officer, government  [facility]  Office  [phone]  CC: Primary care physician; Marguarite Arbour, MD   Note: This  dictation was prepared with Dragon dictation along with smaller phrase technology. Any transcriptional errors that result from this process are unintentional.

## 2016-10-12 NOTE — Anesthesia Procedure Notes (Signed)
Date/Time: 10/12/2016 2:14 PM Performed by: Stormy Fabian Pre-anesthesia Checklist: Patient identified, Emergency Drugs available, Suction available and Patient being monitored Patient Re-evaluated:Patient Re-evaluated prior to induction Oxygen Delivery Method: Nasal cannula Induction Type: IV induction Dental Injury: Teeth and Oropharynx as per pre-operative assessment  Comments: Nasal cannula with etCO2 monitoring

## 2016-10-12 NOTE — Op Note (Signed)
[facility] Patient Name: Kelsey Le Procedure Date: 10/12/2016 2:07 PM MRN: [id number] Account #: [id number] Date of Birth: March 14, 1930 Admit Type: Inpatient Age: 83 Room: [facility] ENDO ROOM 3 Gender: Female Note Status: Finalized Procedure:            Upper GI endoscopy Indications:          Melena Providers:            [name] MD, MD Referring MD:         Duane Lope. Judithann Sheen, MD (Referring MD) Medicines:            Propofol per Anesthesia Complications:        No immediate complications. Procedure:            Pre-Anesthesia Assessment:                       - Prior to the procedure, a History and Physical was                        performed, and patient medications and allergies were                        reviewed. The patient's tolerance of previous                        anesthesia was also reviewed. The risks and benefits of                        the procedure and the sedation options and risks were                        discussed with the patient. Le questions were                        answered, and informed consent was obtained. Prior                        Anticoagulants: The patient has taken no previous                        anticoagulant or antiplatelet agents. ASA Grade                        Assessment: II - A patient with mild systemic disease.                        After reviewing the risks and benefits, the patient was                        deemed in satisfactory condition to undergo the                        procedure.                       After obtaining informed consent, the endoscope was                        passed under direct vision. Throughout the procedure,  the patient's blood pressure, pulse, and oxygen                        saturations were monitored continuously. The Endoscope                        was introduced through the mouth, and advanced to the                        third  part of duodenum. The upper GI endoscopy was                        accomplished without difficulty. The patient tolerated                        the procedure well. Findings:      A small hiatal hernia was present.      Diffuse mild inflammation characterized by granularity was found in the       entire examined stomach. Biopsies were taken with a cold forceps for       histology.      The examined duodenum was normal. Impression:           - Small hiatal hernia.                       - Gastritis. Biopsied.                       - Normal examined duodenum. Recommendation:       - Await pathology results.                       - Return patient to hospital ward for ongoing care. Procedure Code(s):    --- Professional ---                       [phone], Esophagogastroduodenoscopy, flexible, transoral;                        with biopsy, single or multiple Diagnosis Code(s):    --- Professional ---                       K92.1, Melena (includes Hematochezia)                       K29.70, Gastritis, unspecified, without bleeding CPT copyright 2016 American Medical Association. Le rights reserved. The codes documented in this report are preliminary and upon coder review may  be revised to meet current compliance requirements. [name] MD, MD 10/12/2016 2:27:36 PM This report has been signed electronically. Number of Addenda: 0 Note Initiated On: 10/12/2016 2:07 PM      [facility]

## 2016-10-12 NOTE — Anesthesia Post-op Follow-up Note (Signed)
Anesthesia QCDR form completed.        

## 2016-10-12 NOTE — Progress Notes (Signed)
PT Cancellation Note  Patient Details Name: EDITHE DOBBIN MRN: [id number] DOB: 12-13-1930   Cancelled Treatment:    Reason Eval/Treat Not Completed: Patient at procedure or test/unavailable; Pt off floor at endo.  Pt with discharge orders in chart pending results of testing.  Spoke with RN and awaiting call back when pt returns to floor and if any PT needs identified.  Will continue to follow as appropriate.  Clela Hagadorn A Cleven Jansma, PT 10/12/2016, 4:40 PM

## 2016-10-12 NOTE — Discharge Instructions (Signed)
Heart healthy diet

## 2016-10-12 NOTE — Care Management Important Message (Signed)
Important Message  Patient Details  Name: Kelsey Le MRN: [id number] Date of Birth: 11-25-1930   Medicare Important Message Given:  N/A - LOS <3 / Initial given by admissions    Chapman Fitch, RN 10/12/2016, 9:47 AM

## 2016-10-12 NOTE — Progress Notes (Signed)
IV was removed. Discharge instructions, follow-up appointments, and prescriptions were provided to the pt and children at bedside. All questions were answered. The pt was taken downstairs via wheelchair by volunteers.

## 2016-10-12 NOTE — Transfer of Care (Signed)
Immediate Anesthesia Transfer of Care Note  Patient: Kelsey Le  Procedure(s) Performed: Procedure(s): ESOPHAGOGASTRODUODENOSCOPY (EGD) WITH PROPOFOL (N/A)  Patient Location: PACU and Endoscopy Unit  Anesthesia Type:General  Level of Consciousness: sedated  Airway & Oxygen Therapy: Patient Spontanous Breathing and Patient connected to nasal cannula oxygen  Post-op Assessment: Report given to RN and Post -op Vital signs reviewed and stable  Post vital signs: Reviewed and stable  Last Vitals:  Vitals:   10/12/16 1336 10/12/16 1433  BP: (!) 136/58 134/62  Pulse: 88 88  Resp: 20 12  Temp: (!) 35.7 C   SpO2: 100% 100%    Complications: No apparent anesthesia complications

## 2016-10-15 ENCOUNTER — Encounter: Payer: Self-pay | Admitting: Gastroenterology

## 2016-10-16 ENCOUNTER — Ambulatory Visit (HOSPITAL_COMMUNITY): Admit: 2016-10-16 | Payer: Medicare Other

## 2016-10-16 ENCOUNTER — Telehealth: Payer: Self-pay | Admitting: Cardiovascular Disease

## 2016-10-16 ENCOUNTER — Ambulatory Visit (HOSPITAL_COMMUNITY): Payer: Medicare Other

## 2016-10-16 LAB — SURGICAL PATHOLOGY

## 2016-10-16 NOTE — Telephone Encounter (Signed)
    Chart reviewed as part of pre-operative protocol coverage. Asked to review regarding lumbar epidural steroid injection. Patient with history of severe AS, undergoing TAVR workup, just seen recently by Dr. Clifton James. Patient was recently put on trial of aspirin and dropped her hemoglobin with progressive anemia requiring blood transfusion felt due to AVMs/gastritis, thus aspirin was stopped.  I reviewed with Dr. Clifton James. From cardiovascular standpoint he feels there is no contraindication to lumbar steroid injection however would recommend consideration for patient to be evaluated in close follow-up by primary care/GI for her anemia as we are not sure if this would affect their decision to go forward with injection. Will forward to pre-op nurse pool to call doctor's office to make them aware.  Laurann Montana, PA-C 10/16/2016, 3:40 PM

## 2016-10-16 NOTE — Telephone Encounter (Signed)
Attempted to call [facility] in regards to Dr. Gibson Ramp recommendations and the clinic is closed.  I called and talked with the patient to make certain she is aware of Dr. Gibson Ramp advice. She states she had some dark black stool last week after approximately 9 months of not seeing any blood in stool. I advised her that she make the doctor aware at [facility] tomorrow when she goes for her appointment and tell them to call our office with questions or concerns. She verbalized understanding and agreement.

## 2016-10-16 NOTE — Telephone Encounter (Signed)
   [facility] Pre-operative Risk Assessment    Request for surgical clearance: Is it okay to have this injection?  1. What type of surgery is being performed? Lumbar Epidural  2. When is this surgery scheduled? 10/17/16 @10 :30 am  3. Are there any medications that need to be held prior to surgery and how long?no  4. Practice name and name of physician performing surgery? [facility]/ Benjamin Chasnis DO  5. What is your office phone and fax number? [phone]  6. Anesthesia type (None, local, MAC, general) ? Local anesthesia   Marlou Sa 10/16/2016, 2:28 PM  _________________________________________________________________   (provider comments below)

## 2016-10-16 NOTE — Telephone Encounter (Signed)
New message    Irving Burton from the [facility] is calling asking for a call back. She is calling to find out if it's ok for pt to have epidural tomorrow. Please call.

## 2016-10-17 ENCOUNTER — Encounter: Payer: Medicare Other | Admitting: Thoracic Surgery (Cardiothoracic Vascular Surgery)

## 2016-10-17 ENCOUNTER — Ambulatory Visit: Payer: Medicare Other | Admitting: Physical Therapy

## 2016-10-17 ENCOUNTER — Encounter: Payer: Self-pay | Admitting: Gastroenterology

## 2016-10-18 NOTE — Progress Notes (Signed)
Multi-Disciplinary Valve Clinic Note  Chief Complaint  Patient presents with  . Follow-up    Severe aortic stenosis    History of Present Illness: 83 yo female with history of iron deficiency anemia, prior GI bleeding due to AVMs, asthma and severe aortic valve stenosis who is here today for follow up in the valve clinic. I saw her as a new consult 10/01/16 at the request of Dr. Kirke Corin for discussion regarding her aortic valve stenosis and possible TAVR. She has prior GI bleeding due to AVMs but over the 6 months prior to her first visit with me, this had been stable. She has been followed for years for aortic stenosis. Echo 08/28/16 with normal LV systolic function, LVEF=60-65%. The aortic valve stenosis has progressed. Mean gradient 65 mmHg, peak gradient 106 mmHg, AVA 0.75 cm2. No mitral valve disease. She was seen in the ED at [facility] on 09/28/16 with c/o headache and dizziness. CTA head in th ED showed a small (2mm) IC aneurysm, mild carotid disease and a 10 mm nodular lesion in the left vallecula 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  She has plans to see Neurosurgery today. She has seen ENT and there is no workup planned for the tongue abnormality.    She described worsened dyspnea with dizziness at her first visit in my office. We discussed the TAVR procedure in detail. Cardiac cath 10/05/16 with no evidence of CAD. There was severe AS with mean gradient 42 mmHg, peak to peak gradient 52 mmHg, AVA 0.69 cm2. I started ASA 81 mg daily post cath to see if she could tolerate this with history of AVMs and bleeding. She was admitted to [facility] 10/11/16, after one week of ASA therapy with weakness and melena, most likely due to AVMs. Hgb dropped from 11.9 to 6.1. ASA was stopped. EGD on 10/12/16 showed gastritis only. GI consult team felt that her bleeding was likely from her AVMs. She was given 3 units of pRBCs and Hgb was 10 prior to d/c home. Her melena resolved off of ASA.   She is here  today for follow up. The patient denies any chest pain, palpitations, lower extremity edema, orthopnea, PND. She continues to have dizziness but no near syncope or syncope. She is feeling well overall. No dark stools over last week. She is seeing primary care next week for labs.   Primary Care Physician: Marguarite Arbour, MD Primary Cardiologist: Kirke Corin   Past Medical History:  Diagnosis Date  . Aortic stenosis    a. TTE 8/17: nl EF, mod to sev AS w/ mean gradient 28 mmHg, valve area 0.97, mod pulm HTN, small pericardial effusion  . Arrhythmia   . Asthma   . AVM (arteriovenous malformation)    a. small intestine   . Broken back   . Broken wrist   . Hip fracture (HCC)    a. in the setting of mechanical fall  . Iron deficiency anemia    a. requiring periodic transfusions  . Syncope and collapse     Past Surgical History:  Procedure Laterality Date  . APPENDECTOMY    . BACK SURGERY     back fusion  . ESOPHAGOGASTRODUODENOSCOPY (EGD) WITH PROPOFOL N/A 11/15/2015   Procedure: ESOPHAGOGASTRODUODENOSCOPY (EGD) WITH PROPOFOL;  Surgeon: [name], MD;  Location: [facility] ENDOSCOPY;  Service: Endoscopy;  Laterality: N/A;  . ESOPHAGOGASTRODUODENOSCOPY (EGD) WITH PROPOFOL N/A 10/12/2016   Procedure: ESOPHAGOGASTRODUODENOSCOPY (EGD) WITH PROPOFOL;  Surgeon: [name], MD;  Location: [facility] ENDOSCOPY;  Service: Endoscopy;  Laterality: N/A;  . FRACTURE SURGERY    . GIVENS CAPSULE STUDY N/A 02/20/2016   Procedure: GIVENS CAPSULE STUDY;  Surgeon: Scot Jun, MD;  Location: [facility] ENDOSCOPY;  Service: Endoscopy;  Laterality: N/A;  . HIP FRACTURE SURGERY    . INTRAMEDULLARY (IM) NAIL INTERTROCHANTERIC Right 11/18/2015   Procedure: INTRAMEDULLARY (IM) NAIL INTERTROCHANTRIC;  Surgeon: Juanell Fairly, MD;  Location: [facility] ORS;  Service: Orthopedics;  Laterality: Right;  . KYPHOPLASTY N/A 08/19/2014   Procedure: KYPHOPLASTY;  Surgeon: Kennedy Bucker, MD;  Location: [facility] ORS;  Service: Orthopedics;   Laterality: N/A;  . PARTIAL HYSTERECTOMY    . RIGHT/LEFT HEART CATH AND CORONARY ANGIOGRAPHY N/A 10/05/2016   Procedure: RIGHT/LEFT HEART CATH AND CORONARY ANGIOGRAPHY;  Surgeon: Kathleene Hazel, MD;  Location: [facility] INVASIVE CV LAB;  Service: Cardiovascular;  Laterality: N/A;  . TUBAL LIGATION      Current Outpatient Prescriptions  Medication Sig Dispense Refill  . acetaminophen (TYLENOL) 325 MG tablet Take 325 mg by mouth every 6 (six) hours as needed (for pain.).    Marland Kitchen cholecalciferol (VITAMIN D) 400 units TABS tablet Take 800 Units by mouth daily.     . meclizine (ANTIVERT) 25 MG tablet Take 1 tablet (25 mg total) by mouth 3 (three) times daily as needed for dizziness. 15 tablet 0  . pantoprazole (PROTONIX) 40 MG tablet Take 1 tablet (40 mg total) by mouth daily. 30 tablet 0  . vitamin B-12 (CYANOCOBALAMIN) 1000 MCG tablet Take 1,000 mcg by mouth daily.     No current facility-administered medications for this visit.     Allergies  Allergen Reactions  . Aspirin     GI bleeding   . Penicillins Rash and Other (See Comments)    Antibiotics Has patient had a PCN reaction causing immediate rash, facial/tongue/throat swelling, SOB or lightheadedness with hypotension: Unknown Has patient had a PCN reaction causing severe rash involving mucus membranes or skin necrosis: Unknown Has patient had a PCN reaction that required hospitalization: Unknown Has patient had a PCN reaction occurring within the last 10 years: No Childhood reaction  If all of the above answers are "NO", then may proceed with Cephalosporin use.     Social History   Social History  . Marital status: Legally Separated    Spouse name: N/A  . Number of children: N/A  . Years of education: N/A   Occupational History  . Not on file.   Social History Main Topics  . Smoking status: Never Smoker  . Smokeless tobacco: Never Used  . Alcohol use No  . Drug use: No  . Sexual activity: Not on file   Other Topics  Concern  . Not on file   Social History Narrative  . No narrative on file    Family History  Problem Relation Age of Onset  . Heart block Mother   . Heart failure Father   . Heart disease Sister   . Breast cancer Maternal Aunt     Review of Systems:  As stated in the HPI and otherwise negative.   BP 138/64   Pulse 61   Ht 5\' 5"  (1.651 m)   Wt 118 lb (53.5 kg)   SpO2 98%   BMI 19.64 kg/m   Physical Examination:  General: Well developed, well nourished, NAD  HEENT: OP clear, mucus membranes moist  SKIN: warm, dry. No rashes. Neuro: No focal deficits  Musculoskeletal: Muscle strength 5/5 all ext  Psychiatric: Mood and affect normal  Neck:  No JVD, no carotid bruits, no thyromegaly, no lymphadenopathy.  Lungs:Clear bilaterally, no wheezes, rhonci, crackles Cardiovascular: Regular rate and rhythm. Loud harsh systolic murmur. Abdomen:Soft. Bowel sounds present. Non-tender.  Extremities: No lower extremity edema. Pulses are 2 + in the bilateral DP/PT.   Cardiac cath 10/05/16:  1. No angiographic evidence of CAD 2. Severe aortic valve stenosis (mean gradient 42.4 mmHg, peak to peak gradient 52 mmHg, AVA 0.69 cm2).   Diagnostic Diagram       Implants     No implant documentation for this case.  MERGE Images   Show images for CARDIAC CATHETERIZATION   Link to Procedure Log   Procedure Log    Hemo Data    Most Recent Value  Fick Cardiac Output 5.54 L/min  Fick Cardiac Output Index 3.57 (L/min)/BSA  Aortic Mean Gradient 42.4 mmHg  Aortic Peak Gradient 52 mmHg  Aortic Valve Area 0.69  Aortic Value Area Index 0.45 cm2/BSA  RA A Wave 2 mmHg  RA V Wave 2 mmHg  RA Mean 1 mmHg  RV Systolic Pressure 23 mmHg  RV Diastolic Pressure 2 mmHg  RV EDP 4 mmHg  PA Systolic Pressure 22 mmHg  PA Diastolic Pressure 10 mmHg  PA Mean 15 mmHg  PW A Wave 13 mmHg  PW V Wave 13 mmHg  PW Mean 8 mmHg  AO Systolic Pressure 125 mmHg  AO Diastolic Pressure 54 mmHg  AO Mean 85  mmHg  LV Systolic Pressure 208 mmHg  LV Diastolic Pressure 5 mmHg  LV EDP 17 mmHg  Arterial Occlusion Pressure Extended Systolic Pressure 166 mmHg  Arterial Occlusion Pressure Extended Diastolic Pressure 75 mmHg  Arterial Occlusion Pressure Extended Mean Pressure 116 mmHg  Left Ventricular Apex Extended Systolic Pressure 218 mmHg  Left Ventricular Apex Extended Diastolic Pressure 7 mmHg  Left Ventricular Apex Extended EDP Pressure 17 mmHg  QP/QS 1  TPVR Index 4.2 HRUI  TSVR Index 23.79 HRUI  PVR SVR Ratio 0.08  TPVR/TSVR Ratio 0.18     Echo 08/28/16: Left ventricle: The cavity size was normal. There was mild   concentric hypertrophy. Systolic function was normal. The   estimated ejection fraction was in the range of 60% to 65%. Wall   motion was normal; there were no regional wall motion   abnormalities. Doppler parameters are consistent with abnormal   left ventricular relaxation (grade 1 diastolic dysfunction). - Aortic valve: There was severe stenosis. Mean gradient (S): 65 mm   Hg. Valve area (VTI): 0.75 cm^2. - Mitral valve: Valve area by pressure half-time: 2.06 cm^2. - Pulmonary arteries: Systolic pressure was mildly increased. PA   peak pressure: 38 mm Hg (S).  Left ventricle:  The cavity size was normal. There was mild concentric hypertrophy. Systolic function was normal. The estimated ejection fraction was in the range of 60% to 65%. Wall motion was normal; there were no regional wall motion abnormalities. Doppler parameters are consistent with abnormal left ventricular relaxation (grade 1 diastolic dysfunction).  ------------------------------------------------------------------- Aortic valve:   Trileaflet; mildly thickened, severely calcified leaflets.  Doppler:   There was severe stenosis.   There was no regurgitation.    VTI ratio of LVOT to aortic valve: 0.27. Valve area (VTI): 0.75 cm^2. Indexed valve area (VTI): 0.49 cm^2/m^2. Mean velocity ratio of LVOT to  aortic valve: 0.25. Valve area (Vmean): 0.71 cm^2. Indexed valve area (Vmean): 0.46 cm^2/m^2. Mean gradient (S): 65 mm Hg. Peak gradient (S): 106 mm Hg.  ------------------------------------------------------------------- Aorta:  Aortic root: The aortic  root was normal in size.  ------------------------------------------------------------------- Mitral valve:   Structurally normal valve.   Mobility was not restricted.  Doppler:  Transvalvular velocity was within the normal range. There was no evidence for stenosis. There was no regurgitation.    Valve area by pressure half-time: 2.06 cm^2. Indexed valve area by pressure half-time: 1.34 cm^2/m^2.    Peak gradient (D): 4 mm Hg.  ------------------------------------------------------------------- Left atrium:  The atrium was normal in size.  ------------------------------------------------------------------- Right ventricle:  The cavity size was normal. Wall thickness was normal. Systolic function was normal.  ------------------------------------------------------------------- Pulmonic valve:    Doppler:  Transvalvular velocity was within the normal range. There was no evidence for stenosis.  ------------------------------------------------------------------- Tricuspid valve:   Structurally normal valve.    Doppler: Transvalvular velocity was within the normal range. There was mild regurgitation.  ------------------------------------------------------------------- Pulmonary artery:   The main pulmonary artery was normal-sized. Systolic pressure was mildly increased.  ------------------------------------------------------------------- Right atrium:  The atrium was normal in size.  ------------------------------------------------------------------- Pericardium:  There was no pericardial effusion.  ------------------------------------------------------------------- Systemic veins: Inferior vena cava: Poorly visualized.  The vessel was normal in size.  ------------------------------------------------------------------- Measurements   Left ventricle                           Value          Reference  LV ID, ED, PLAX chordal          (L)     32    mm       43 - 52  LV ID, ES, PLAX chordal          (L)     19    mm       23 - 38  LV fx shortening, PLAX chordal           41    %        >=29  LV PW thickness, ED                      11    mm       ----------  IVS/LV PW ratio, ED                      1              <=1.3  Stroke volume, 2D                        93    ml       ----------  Stroke volume/bsa, 2D                    61    ml/m^2   ----------  LV e&', lateral                           10.3  cm/s     ----------  LV E/e&', lateral                         9.09           ----------  LV e&', medial                            5.87  cm/s     ----------  LV E/e&', medial                          15.95          ----------  LV e&', average                           8.09  cm/s     ----------  LV E/e&', average                         11.58          ----------    Ventricular septum                       Value          Reference  IVS thickness, ED                        11    mm       ----------    LVOT                                     Value          Reference  LVOT ID, S                               19    mm       ----------  LVOT area                                2.84  cm^2     ----------  LVOT ID                                  19    mm       ----------  LVOT mean velocity, S                    94.1  cm/s     ----------  LVOT VTI, S                              32.9  cm       ----------  Stroke volume (SV), LVOT DP              93.3  ml       ----------  Stroke index (SV/bsa), LVOT DP           60.8  ml/m^2   ----------    Aortic valve                             Value          Reference  Aortic valve peak velocity, S            515   cm/s     ----------  Aortic valve mean velocity, S             378   cm/s     ----------  Aortic valve VTI, S                      124   cm       ----------  Aortic mean gradient, S                  65    mm Hg    ----------  Aortic peak gradient, S                  106   mm Hg    ----------  VTI ratio, LVOT/AV                       0.27           ----------  Aortic valve area, VTI                   0.75  cm^2     ----------  Aortic valve area/bsa, VTI               0.49  cm^2/m^2 ----------  Velocity ratio, mean, LVOT/AV            0.25           ----------  Aortic valve area, mean velocity         0.71  cm^2     ----------  Aortic valve area/bsa, mean              0.46  cm^2/m^2 ----------  velocity    Aorta                                    Value          Reference  Aortic root ID, ED                       26    mm       ----------  Ascending aorta ID, A-P, S               28    mm       ----------    Left atrium                              Value          Reference  LA ID, A-P, ES                           28    mm       ----------  LA ID/bsa, A-P                           1.83  cm/m^2   <=2.2  LA volume, ES, 1-p A4C                   31.4  ml       ----------  LA volume/bsa, ES, 1-p A4C               20.5  ml/m^2   ----------  LA volume, ES, 1-p A2C                   47.6  ml       ----------  LA volume/bsa, ES, 1-p A2C               31    ml/m^2   ----------    Mitral valve                             Value          Reference  Mitral E-wave peak velocity              93.6  cm/s     ----------  Mitral A-wave peak velocity              121   cm/s     ----------  Mitral deceleration time         (H)     366   ms       150 - 230  Mitral pressure half-time                107   ms       ----------  Mitral peak gradient, D                  4     mm Hg    ----------  Mitral E/A ratio, peak                   0.8            ----------  Mitral valve area, PHT, DP               2.06  cm^2     ----------  Mitral valve area/bsa, PHT, DP           1.34   cm^2/m^2 ----------    Pulmonary arteries                       Value          Reference  PA pressure, S, DP               (H)     38    mm Hg    <=30    Tricuspid valve                          Value          Reference  Tricuspid regurg peak velocity           286   cm/s     ----------  Tricuspid peak RV-RA gradient            33    mm Hg    ----------    Right atrium                             Value          Reference  RA ID, S-I, ES, A4C                      40.9  mm       34 - 49  RA area, ES, A4C                         9.17  cm^2     8.3 - 19.5  RA volume, ES, A/L  16.2  ml       ----------  RA volume/bsa, ES, A/L                   10.6  ml/m^2   ----------    Right ventricle                          Value          Reference  TAPSE                                    26.2  mm       ----------  RV s&', lateral, S                        12.7  cm/s     ----------    Pulmonic valve                           Value          Reference  Pulmonic valve peak velocity, S          123   cm/s     ----------  EKG:  EKG is not ordered today. The ekg ordered today demonstrates   Recent Labs: 09/28/2016: ALT 15 10/11/2016: BUN 32; Creatinine, Ser 0.81; Platelets 134; Potassium 3.8; Sodium 143 10/12/2016: Hemoglobin 10.0   Lipid Panel No results found for: CHOL, TRIG, HDL, CHOLHDL, VLDL, LDLCALC, LDLDIRECT   Wt Readings from Last 3 Encounters:  10/19/16 118 lb (53.5 kg)  10/10/16 113 lb (51.3 kg)  10/05/16 113 lb (51.3 kg)     Other studies Reviewed: Additional studies/ records that were reviewed today include: Echo images Review of the above records demonstrates: severe AS, normal LV systolic function  STS Risk Score:  Risk of Mortality: 2.9%  Morbidity or Mortality: 15.341%  Long Length of Stay: 6.955%  Short Length of Stay: 29.461%  Permanent Stroke: 1.877%  Prolonged Ventilation: 9.19%  DSW Infection: 0.124%  Renal Failure: 2.579%  Reoperation:  8.258%  Assessment and Plan:   1. Severe aortic valve stenosis: She has stage D symptomatic aortic valve stenosis. This is confirmed with cardiac cath. No evidence of CAD by cath 10/05/16. She is a good candidate for TAVR and we had planned on proceeding with the TAVR workup. Unfortunately, she has had a life threatening GI bleed from her known AVMs on ASA 81 mg daily. Her ASA was stopped. She is feeling better after her blood transfusion. She has had no syncope but has some dizziness. I have had a 40 minutes discussion today with the patient and her family regarding the risk of valve replacement if we are unable to use ASA or Plavix due to her GI bleeding. It is a class 1 indication to use ASA with any prosthetic or bioprosthetic valve. If ASA is not used, the risk of valve thrombosis is high. She understands this. I am not sure she will ever be able to tolerate ASA due to the AVMs. She actually started having melena two days after starting ASA but did not tell anyone. Her son and daughter are very involved in her care and are both present again today. I have outlined our options which include:    A) Proceed with TAVR and not use ASA. The risk of  valve thrombosis would be very high. If we did use ASA following TAVR, could follow H/H every 3-4 days and try to get her through several months of anti-platelet therapy.    B)Proceed with aortic balloon valvuloplasty which would also require IV heparin for the procedure and would only give her benefit for 12-18 months with risk of procedural stroke and bleeding   C)Follow AS for now, knowing that she will begin to do poorly over next 12-18 months with worsened CHF/dizziness/syncope and chest pain.   For now, will follow. I will see her back in 6-8 weeks to discuss.   Current medicines are reviewed at length with the patient today.  The patient does not have concerns regarding medicines.  The following changes have been made:  no change  Labs/ tests ordered  today include:   No orders of the defined types were placed in this encounter.  Disposition:   FU with me in 6-8 weeks.   Signed, Verne Carrow, MD 10/19/2016 9:29 AM    [facility] [address] [place], [place], [place]  [address] Phone: [phone]; Fax: [phone]

## 2016-10-19 ENCOUNTER — Encounter: Payer: Medicare Other | Admitting: Thoracic Surgery (Cardiothoracic Vascular Surgery)

## 2016-10-19 ENCOUNTER — Ambulatory Visit (INDEPENDENT_AMBULATORY_CARE_PROVIDER_SITE_OTHER): Payer: Medicare Other | Admitting: Cardiovascular Disease

## 2016-10-19 ENCOUNTER — Encounter: Payer: Self-pay | Admitting: Cardiovascular Disease

## 2016-10-19 ENCOUNTER — Ambulatory Visit (HOSPITAL_COMMUNITY): Payer: Medicare Other

## 2016-10-19 VITALS — BP 138/64 | HR 61 | Ht 65.0 in | Wt 118.0 lb

## 2016-10-19 DIAGNOSIS — I350 Nonrheumatic aortic (valve) stenosis: Secondary | ICD-10-CM

## 2016-10-19 NOTE — Patient Instructions (Signed)
Medication Instructions:  Your physician recommends that you continue on your current medications as directed. Please refer to the Current Medication list given to you today.   Labwork: none  Testing/Procedures: none  Follow-Up: You have a follow up appointment on November 26,2018 at 8:45  Any Other Special Instructions Will Be Listed Below (If Applicable).     If you need a refill on your cardiac medications before your next appointment, please call your pharmacy.

## 2016-10-22 NOTE — Telephone Encounter (Signed)
Faxed via [facility]/ [facility] IncBenjamin Chasnis DO

## 2016-10-22 NOTE — Telephone Encounter (Signed)
Our phones are down call [facility] [phone]

## 2016-10-22 NOTE — Telephone Encounter (Signed)
Phone number?

## 2016-10-23 ENCOUNTER — Inpatient Hospital Stay (HOSPITAL_BASED_OUTPATIENT_CLINIC_OR_DEPARTMENT_OTHER): Payer: Medicare Other | Admitting: Oncology

## 2016-10-23 ENCOUNTER — Inpatient Hospital Stay: Payer: Medicare Other | Attending: Oncology

## 2016-10-23 ENCOUNTER — Telehealth: Payer: Self-pay | Admitting: *Deleted

## 2016-10-23 VITALS — BP 154/90 | HR 85 | Temp 98.1°F | Resp 18 | Wt 114.4 lb

## 2016-10-23 DIAGNOSIS — J90 Pleural effusion, not elsewhere classified: Secondary | ICD-10-CM | POA: Insufficient documentation

## 2016-10-23 DIAGNOSIS — D500 Iron deficiency anemia secondary to blood loss (chronic): Secondary | ICD-10-CM

## 2016-10-23 DIAGNOSIS — Z79899 Other long term (current) drug therapy: Secondary | ICD-10-CM | POA: Diagnosis not present

## 2016-10-23 DIAGNOSIS — M47812 Spondylosis without myelopathy or radiculopathy, cervical region: Secondary | ICD-10-CM | POA: Insufficient documentation

## 2016-10-23 DIAGNOSIS — J45909 Unspecified asthma, uncomplicated: Secondary | ICD-10-CM | POA: Diagnosis not present

## 2016-10-23 DIAGNOSIS — Z8781 Personal history of (healed) traumatic fracture: Secondary | ICD-10-CM

## 2016-10-23 DIAGNOSIS — K921 Melena: Secondary | ICD-10-CM | POA: Diagnosis not present

## 2016-10-23 DIAGNOSIS — I499 Cardiac arrhythmia, unspecified: Secondary | ICD-10-CM

## 2016-10-23 DIAGNOSIS — I350 Nonrheumatic aortic (valve) stenosis: Secondary | ICD-10-CM | POA: Diagnosis not present

## 2016-10-23 DIAGNOSIS — I6529 Occlusion and stenosis of unspecified carotid artery: Secondary | ICD-10-CM

## 2016-10-23 DIAGNOSIS — D649 Anemia, unspecified: Secondary | ICD-10-CM

## 2016-10-23 LAB — CBC WITH DIFFERENTIAL/PLATELET
BASOS PCT: 1 %
Basophils Absolute: 0.1 10*3/uL (ref 0–0.1)
EOS ABS: 0.1 10*3/uL (ref 0–0.7)
Eosinophils Relative: 1 %
HEMATOCRIT: 23.7 % — AB (ref 35.0–47.0)
Hemoglobin: 7.9 g/dL — ABNORMAL LOW (ref 12.0–16.0)
Lymphocytes Relative: 15 %
Lymphs Abs: 1.1 10*3/uL (ref 1.0–3.6)
MCH: 31.4 pg (ref 26.0–34.0)
MCHC: 33.3 g/dL (ref 32.0–36.0)
MCV: 94.3 fL (ref 80.0–100.0)
MONO ABS: 0.5 10*3/uL (ref 0.2–0.9)
Monocytes Relative: 6 %
NEUTROS ABS: 5.9 10*3/uL (ref 1.4–6.5)
Neutrophils Relative %: 77 %
Platelets: 211 10*3/uL (ref 150–440)
RBC: 2.51 MIL/uL — ABNORMAL LOW (ref 3.80–5.20)
RDW: 18.6 % — AB (ref 11.5–14.5)
WBC: 7.6 10*3/uL (ref 3.6–11.0)

## 2016-10-23 LAB — PREPARE RBC (CROSSMATCH)

## 2016-10-23 LAB — IRON AND TIBC
IRON: 66 ug/dL (ref 28–170)
Saturation Ratios: 22 % (ref 10.4–31.8)
TIBC: 299 ug/dL (ref 250–450)
UIBC: 233 ug/dL

## 2016-10-23 LAB — SAMPLE TO BLOOD BANK

## 2016-10-23 LAB — FERRITIN: FERRITIN: 55 ng/mL (ref 11–307)

## 2016-10-23 NOTE — Telephone Encounter (Signed)
S/w [facility] they state that Epidural was done last week without any problems. She thanks for the update

## 2016-10-23 NOTE — Progress Notes (Signed)
Symptom Management Consult note [facility]  Telephone:([phone] [phone]  Patient Care Team: Idelle Crouch, MD as PCP - General (Internal Medicine)   Name of the patient: Kelsey Le  [id number]  [birth year]   Date of visit: 10/23/16  Diagnosis- Iron deficiency anemia secondary to GI blood loss.  Chief complaint/ Reason for visit- Black Stools  Heme/Onc history: Patient has a history of intermittent dark stools, but reports a negative EGD, colonoscopy, capsule endoscopy completed in [place] in 2015. Patient had a repeat capsule endoscopy more recently that was also reported as negative. Patient's hemoglobin is decreased, but remained stable and unchanged. Her iron stores continue to be within normal limits. No intervention is needed. Return to clinic in 4 month with repeat laboratory work and further evaluation.   Interval history- Patient was last seen by Dr. Grayland Ormond on 09/19/2016. At that time she felt well and offers no complaints. She denied any recent episodes of hematochezia. Her only complaint is right leg pain that had resolved recently with physical therapy.  She was admitted to the hospital on 09/28/2016 for severe dizziness and heachache. A CT angiogram of the head and neck revealed a 2 mm inferior and medially directed unruptured aneurysm. She was discharged within a prescription for meclizine and instructed to follow up outpatient with a neurologist.   She was evaluated by Dr. Angelena Form on 10/01/2016 for severe aortic stenosis and possible TAVR. It was arranged for her to have a right and left heart catheterization at [facility] on 10/05/2016. She was started on aspirin and would need to remain on aspirin or Plavix following TAVR.  On 10/08/2016 patient called her primary care office and stated she noticed black tarry stools.   On 10/10/2016 she was admitted to the hospital for shortness of breath. On admission she was hypotensive  and tachycardic. Her hemoglobin was 6.1. She is given 1 unit of emergency release blood and 2 more units. Her aspirin was discontinued, she continued IV Protonix and a GI consult was placed. At discharge (10/12/16) her hemoglobin was stable at 10.   On 10/11/2016 Dr. Allen Norris with GI was consulted. An EGD was performed showing gastritis only.   On 10/19/16 she met with Dr. Angelena Form to discuss valve replacement again. If she is unable to tolerate aspirin or Plavix the risk for valve thrombosis is high. For now she will follow up in approximately 6-8 weeks to weigh her options.  Today she presents for dark tarry stools X 2 days and is requesting for her labs to be drawn. Beginning 2 days ago she started having 2-3 episodes a day of black tarry stools. She states she recently had an EGD which only showed gastritis and recently was taken off an aspirin 81 mg. She has felt dizzy and fatigued but thinks this is related to her heart valve. Patient's son Kelsey Le states that there is no real plan of action for the GI bleeding and that "it just comes and goes and they are keeping an eye on it". She denies chest pain or shortness of breath. She denies nausea or vomiting or any urinary complaints. She has not had a fever or flulike illness. Her appetite is "okay".   ECOG FS:1 - Symptomatic but completely ambulatory  Review of systems- Review of Systems  Constitutional: Positive for malaise/fatigue. Negative for chills, fever and weight loss.  HENT: Negative.   Eyes: Negative.   Respiratory: Negative.   Cardiovascular: Negative for chest pain and palpitations.  Gastrointestinal: Positive for blood in stool and melena. Negative for heartburn and nausea.  Genitourinary: Negative.   Musculoskeletal: Negative.   Skin: Negative.   Neurological: Positive for dizziness and weakness.  Endo/Heme/Allergies: Negative.   Psychiatric/Behavioral: Negative.      Current treatment- None.  Allergies  Allergen Reactions  .  Aspirin     GI bleeding   . Penicillins Rash and Other (See Comments)    Antibiotics Has patient had a PCN reaction causing immediate rash, facial/tongue/throat swelling, SOB or lightheadedness with hypotension: Unknown Has patient had a PCN reaction causing severe rash involving mucus membranes or skin necrosis: Unknown Has patient had a PCN reaction that required hospitalization: Unknown Has patient had a PCN reaction occurring within the last 10 years: No Childhood reaction  If all of the above answers are "NO", then may proceed with Cephalosporin use.      Past Medical History:  Diagnosis Date  . Aortic stenosis    a. TTE 8/17: nl EF, mod to sev AS w/ mean gradient 28 mmHg, valve area 0.97, mod pulm HTN, small pericardial effusion  . Arrhythmia   . Asthma   . AVM (arteriovenous malformation)    a. small intestine   . Broken back   . Broken wrist   . Hip fracture ([place])    a. in the setting of mechanical fall  . Iron deficiency anemia    a. requiring periodic transfusions  . Syncope and collapse      Past Surgical History:  Procedure Laterality Date  . APPENDECTOMY    . BACK SURGERY     back fusion  . ESOPHAGOGASTRODUODENOSCOPY (EGD) WITH PROPOFOL N/A 11/15/2015   Procedure: ESOPHAGOGASTRODUODENOSCOPY (EGD) WITH PROPOFOL;  Surgeon: Lucilla Lame, MD;  Location: [facility] ENDOSCOPY;  Service: Endoscopy;  Laterality: N/A;  . ESOPHAGOGASTRODUODENOSCOPY (EGD) WITH PROPOFOL N/A 10/12/2016   Procedure: ESOPHAGOGASTRODUODENOSCOPY (EGD) WITH PROPOFOL;  Surgeon: Lucilla Lame, MD;  Location: [facility] ENDOSCOPY;  Service: Endoscopy;  Laterality: N/A;  . FRACTURE SURGERY    . GIVENS CAPSULE STUDY N/A 02/20/2016   Procedure: GIVENS CAPSULE STUDY;  Surgeon: Manya Silvas, MD;  Location: [facility] ENDOSCOPY;  Service: Endoscopy;  Laterality: N/A;  . HIP FRACTURE SURGERY    . INTRAMEDULLARY (IM) NAIL INTERTROCHANTERIC Right 11/18/2015   Procedure: INTRAMEDULLARY (IM) NAIL INTERTROCHANTRIC;   Surgeon: Thornton Park, MD;  Location: [facility] ORS;  Service: Orthopedics;  Laterality: Right;  . KYPHOPLASTY N/A 08/19/2014   Procedure: KYPHOPLASTY;  Surgeon: Hessie Knows, MD;  Location: [facility] ORS;  Service: Orthopedics;  Laterality: N/A;  . PARTIAL HYSTERECTOMY    . RIGHT/LEFT HEART CATH AND CORONARY ANGIOGRAPHY N/A 10/05/2016   Procedure: RIGHT/LEFT HEART CATH AND CORONARY ANGIOGRAPHY;  Surgeon: Burnell Blanks, MD;  Location: [place] [facility];  Service: Cardiovascular;  Laterality: N/A;  . TUBAL LIGATION      Social History   Social History  . Marital status: Legally Separated    Spouse name: N/A  . Number of children: N/A  . Years of education: N/A   Occupational History  . Not on file.   Social History Main Topics  . Smoking status: Never Smoker  . Smokeless tobacco: Never Used  . Alcohol use No  . Drug use: No  . Sexual activity: Not on file   Other Topics Concern  . Not on file   Social History Narrative  . No narrative on file    Family History  Problem Relation Age of Onset  . Heart block Mother   .  Heart failure Father   . Heart disease Sister   . Breast cancer Maternal Aunt      Current Outpatient Prescriptions:  .  acetaminophen (TYLENOL) 325 MG tablet, Take 325 mg by mouth every 6 (six) hours as needed (for pain.)., Disp: , Rfl:  .  cholecalciferol (VITAMIN D) 400 units TABS tablet, Take 800 Units by mouth daily. , Disp: , Rfl:  .  vitamin B-12 (CYANOCOBALAMIN) 1000 MCG tablet, Take 1,000 mcg by mouth daily., Disp: , Rfl:  .  meclizine (ANTIVERT) 25 MG tablet, Take 1 tablet (25 mg total) by mouth 3 (three) times daily as needed for dizziness. (Patient not taking: Reported on 10/23/2016), Disp: 15 tablet, Rfl: 0 .  pantoprazole (PROTONIX) 40 MG tablet, Take 1 tablet (40 mg total) by mouth daily. (Patient not taking: Reported on 10/23/2016), Disp: 30 tablet, Rfl: 0 No current facility-administered medications for this visit.    Facility-Administered Medications Ordered in Other Visits:  .  0.9 %  sodium chloride infusion, , Intravenous, Once, Burns, Wandra Feinstein, NP .  acetaminophen (TYLENOL) tablet 650 mg, 650 mg, Oral, Once, Burns, Anderson Malta E, NP .  diphenhydrAMINE (BENADRYL) capsule 25 mg, 25 mg, Oral, Once, Jacquelin Hawking, NP  Physical exam:  Vitals:   10/23/16 1141  BP: (!) 154/90  Pulse: 85  Resp: 18  Temp: 98.1 F (36.7 C)  TempSrc: Oral  Weight: 114 lb 6.4 oz (51.9 kg)   Physical Exam  Constitutional: She is oriented to person, place, and time and well-developed, well-nourished, and in no distress.  HENT:  Head: Normocephalic and atraumatic.  Eyes: Pupils are equal, round, and reactive to light.  Neck: Neck supple.  Cardiovascular: Normal rate, regular rhythm and normal heart sounds.   Pulmonary/Chest: Effort normal and breath sounds normal.  Abdominal: Soft. Bowel sounds are normal.  Musculoskeletal: Normal range of motion.  Neurological: She is alert and oriented to person, place, and time.  Skin: Skin is warm and dry. There is pallor.     CMP Latest Ref Rng & Units 10/11/2016  Glucose 65 - 99 mg/dL 83  BUN 6 - 20 mg/dL 32(H)  Creatinine 0.44 - 1.00 mg/dL 0.81  Sodium 135 - 145 mmol/L 143  Potassium 3.5 - 5.1 mmol/L 3.8  Chloride 101 - 111 mmol/L 114(H)  CO2 22 - 32 mmol/L 24  Calcium 8.9 - 10.3 mg/dL 8.3(L)  Total Protein 6.5 - 8.1 g/dL -  Total Bilirubin 0.3 - 1.2 mg/dL -  Alkaline Phos 38 - 126 U/L -  AST 15 - 41 U/L -  ALT 14 - 54 U/L -   CBC Latest Ref Rng & Units 10/23/2016  WBC 3.6 - 11.0 K/uL 7.6  Hemoglobin 12.0 - 16.0 g/dL 7.9(L)  Hematocrit 35.0 - 47.0 % 23.7(L)  Platelets 150 - 440 K/uL 211    No images are attached to the encounter.  Ct Angio Head W Or Wo Contrast  Result Date: 09/29/2016 CLINICAL DATA:  83 y/o F; 2 days of severe headaches with dizziness. EXAM: CT ANGIOGRAPHY HEAD AND NECK TECHNIQUE: Multidetector CT imaging of the head and neck was  performed using the standard protocol during bolus administration of intravenous contrast. Multiplanar CT image reconstructions and MIPs were obtained to evaluate the vascular anatomy. Carotid stenosis measurements (when applicable) are obtained utilizing NASCET criteria, using the distal internal carotid diameter as the denominator. CONTRAST:  75 cc Isovue 370 COMPARISON:  09/28/2016 CT of the head. FINDINGS: CTA NECK FINDINGS Aortic arch: Standard branching.  Imaged portion shows no evidence of aneurysm or dissection. No significant stenosis of the major arch vessel origins. Mild calcific atherosclerosis. Right carotid system: No evidence of dissection, stenosis (50% or greater) or occlusion. Mild non stenotic calcified plaque of carotid bifurcation. Left carotid system: No evidence of dissection, stenosis (50% or greater) or occlusion. Mild non stenotic calcified plaque of carotid bifurcation. Vertebral arteries: Codominant. No evidence of dissection, stenosis (50% or greater) or occlusion. Skeleton: Moderate cervical spondylosis with grade 1 C4-5 anterolisthesis, grade 1 C5-6 retrolisthesis, multilevel disc space narrowing, and left-greater-than-right facet arthropathy. No high-grade bony canal stenosis. Other neck: 8 x 10 x 7 mm nodular lesion within the left vallecula (AP x ML x CC) (series 4, image 69 and series 8, image 80). Upper chest: Biapical calcified pleuroparenchymal scarring in right upper lobe calcified granuloma. Review of the MIP images confirms the above findings CTA HEAD FINDINGS Anterior circulation: Left it'll cavernous inferior and medially directed 2 mm broad-based aneurysm (series 7, image 88). Otherwise no significant stenosis, proximal occlusion, aneurysm, or vascular malformation. Mild non stenotic calcified plaque of carotid siphons. Posterior circulation: No significant stenosis, proximal occlusion, aneurysm, or vascular malformation. Venous sinuses: As permitted by contrast timing,  patent. Anatomic variants: Proximal basilar fenestration. Large left A1, large anterior communicating artery, hypoplastic right A1, normal variant. Small bilateral posterior communicating arteries. Delayed phase: No abnormal intracranial enhancement. Review of the MIP images confirms the above findings IMPRESSION: 1. Patent carotid and vertebral arteries. No dissection, aneurysm, or hemodynamically significant stenosis utilizing NASCET criteria. 2. Patent circle of Willis. No large vessel occlusion or significant stenosis. No vascular malformation. 3. 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 4. 10 mm nodular lesion within the left vallecula 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 Direct visualization is recommended. Electronically Signed   By: Kristine Garbe M.D.   On: 09/29/2016 01:52   Ct Head Wo Contrast  Result Date: 09/28/2016 CLINICAL DATA:  83 year old female with severe headache for 2 days, dizziness. EXAM: CT HEAD WITHOUT CONTRAST TECHNIQUE: Contiguous axial images were obtained from the base of the skull through the vertex without intravenous contrast. COMPARISON:  Brain MRI 11/12/2014 and earlier. FINDINGS: Brain: Stable cerebral volume since 2014. No midline shift, ventriculomegaly, mass effect, evidence of mass lesion, intracranial hemorrhage or evidence of cortically based acute infarction. Gray-white matter differentiation is within normal limits throughout the brain. Vascular: No suspicious intracranial vascular hyperdensity. Skull: Osteopenia.  No acute osseous abnormality identified. Sinuses/Orbits: Visualized paranasal sinuses and mastoids are stable and well pneumatized. Other: Visualized orbits and scalp soft tissues are within normal limits. IMPRESSION: Normal for age non contrast CT appearance of the brain. Electronically Signed   By: Genevie Ann M.D.   On: 09/28/2016 19:37   Ct Angio Neck W And/or Wo Contrast  Result Date:  09/29/2016 CLINICAL DATA:  83 y/o F; 2 days of severe headaches with dizziness. EXAM: CT ANGIOGRAPHY HEAD AND NECK TECHNIQUE: Multidetector CT imaging of the head and neck was performed using the standard protocol during bolus administration of intravenous contrast. Multiplanar CT image reconstructions and MIPs were obtained to evaluate the vascular anatomy. Carotid stenosis measurements (when applicable) are obtained utilizing NASCET criteria, using the distal internal carotid diameter as the denominator. CONTRAST:  75 cc Isovue 370 COMPARISON:  09/28/2016 CT of the head. FINDINGS: CTA NECK FINDINGS Aortic arch: Standard branching. Imaged portion shows no evidence of aneurysm or dissection. No significant stenosis of the major arch vessel  origins. Mild calcific atherosclerosis. Right carotid system: No evidence of dissection, stenosis (50% or greater) or occlusion. Mild non stenotic calcified plaque of carotid bifurcation. Left carotid system: No evidence of dissection, stenosis (50% or greater) or occlusion. Mild non stenotic calcified plaque of carotid bifurcation. Vertebral arteries: Codominant. No evidence of dissection, stenosis (50% or greater) or occlusion. Skeleton: Moderate cervical spondylosis with grade 1 C4-5 anterolisthesis, grade 1 C5-6 retrolisthesis, multilevel disc space narrowing, and left-greater-than-right facet arthropathy. No high-grade bony canal stenosis. Other neck: 8 x 10 x 7 mm nodular lesion within the left vallecula (AP x ML x CC) (series 4, image 69 and series 8, image 80). Upper chest: Biapical calcified pleuroparenchymal scarring in right upper lobe calcified granuloma. Review of the MIP images confirms the above findings CTA HEAD FINDINGS Anterior circulation: Left it'll cavernous inferior and medially directed 2 mm broad-based aneurysm (series 7, image 88). Otherwise no significant stenosis, proximal occlusion, aneurysm, or vascular malformation. Mild non stenotic calcified plaque  of carotid siphons. Posterior circulation: No significant stenosis, proximal occlusion, aneurysm, or vascular malformation. Venous sinuses: As permitted by contrast timing, patent. Anatomic variants: Proximal basilar fenestration. Large left A1, large anterior communicating artery, hypoplastic right A1, normal variant. Small bilateral posterior communicating arteries. Delayed phase: No abnormal intracranial enhancement. Review of the MIP images confirms the above findings IMPRESSION: 1. Patent carotid and vertebral arteries. No dissection, aneurysm, or hemodynamically significant stenosis utilizing NASCET criteria. 2. Patent circle of Willis. No large vessel occlusion or significant stenosis. No vascular malformation. 3. 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 4. 10 mm nodular lesion within the left vallecula 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 Direct visualization is recommended. Electronically Signed   By: Kristine Garbe M.D.   On: 09/29/2016 01:52     Assessment and plan- Patient is a 83 y.o. female who presents for hematochezia. Vital signs are stable. She is anemic (7.9). She appears pale. Otherwise exam is stable.   1. Anemia: Transfuse 1 unit PRBC tomorrow. Hemoglobin dropped from 10 to 7.9 in 11 days.  2. Hematochezia: Spoke with patient and son at length about her continued hematochezia. She recently had an EGD while in the hospital and it only showed gastritis. She is no longer taking aspirin. We will CC Dr. Allen Norris. At this point in time she does not have follow-up with GI but she states if the bleeding continues she will make an appointment. She will keep [place] updated on her stools and we will schedule an appointment for one week from now to check her labs.   Visit Diagnosis 1. Hematochezia   2. Anemia, unspecified type     Patient expressed understanding and was in agreement with this plan. She also understands that She can  call clinic at any time with any questions, concerns, or complaints.   Greater than 50% of this visit was spent in counseling and coordination of care.  Marisue Humble [facility] at [facility] Pager- [phone] 10/24/2016 9:17 AM

## 2016-10-23 NOTE — Progress Notes (Signed)
Patient here today as acute add on for symptom management clinic. Patient reports she has been passing blood in her stool since yesterday. Patient reports she was recently evaluated by GI.

## 2016-10-23 NOTE — Telephone Encounter (Signed)
Patient had called this morning then showed up in the office reporting that she has had blood loss from bowels and wants checked. Labs ordered per VO Dr Orlie DakinFinnegan. Registration informed to check patient in and send to lab.

## 2016-10-24 ENCOUNTER — Inpatient Hospital Stay: Payer: Medicare Other

## 2016-10-24 DIAGNOSIS — D649 Anemia, unspecified: Secondary | ICD-10-CM

## 2016-10-24 DIAGNOSIS — D500 Iron deficiency anemia secondary to blood loss (chronic): Secondary | ICD-10-CM | POA: Diagnosis not present

## 2016-10-24 MED ORDER — ACETAMINOPHEN 325 MG PO TABS
650.0000 mg | ORAL_TABLET | Freq: Once | ORAL | Status: AC
  Administered 2016-10-24: 650 mg via ORAL

## 2016-10-24 MED ORDER — DIPHENHYDRAMINE HCL 25 MG PO CAPS
25.0000 mg | ORAL_CAPSULE | Freq: Once | ORAL | Status: AC
  Administered 2016-10-24: 25 mg via ORAL

## 2016-10-24 MED ORDER — SODIUM CHLORIDE 0.9 % IV SOLN
Freq: Once | INTRAVENOUS | Status: AC
  Administered 2016-10-24: 10:00:00 via INTRAVENOUS
  Filled 2016-10-24: qty 1000

## 2016-10-25 ENCOUNTER — Encounter (HOSPITAL_COMMUNITY): Payer: Medicare Other

## 2016-10-25 ENCOUNTER — Encounter: Payer: Medicare Other | Admitting: Surgery

## 2016-10-25 LAB — TYPE AND SCREEN
ABO/RH(D): A POS
ANTIBODY SCREEN: NEGATIVE
UNIT DIVISION: 0
UNIT DIVISION: 0

## 2016-10-25 LAB — BPAM RBC
BLOOD PRODUCT EXPIRATION DATE: 201811012359
Blood Product Expiration Date: 201811072359
ISSUE DATE / TIME: 201810241015
ISSUE DATE / TIME: 201810240940
Unit Type and Rh: 600
Unit Type and Rh: 6200

## 2016-10-26 ENCOUNTER — Ambulatory Visit (HOSPITAL_COMMUNITY): Payer: Medicare Other

## 2016-10-26 ENCOUNTER — Encounter: Payer: Self-pay | Admitting: Emergency Medicine

## 2016-10-26 ENCOUNTER — Inpatient Hospital Stay
Admission: EM | Admit: 2016-10-26 | Discharge: 2016-10-27 | DRG: 812 | Disposition: A | Discharge status: Home / Self Care | Payer: Medicare Other | Attending: Internal Medicine | Admitting: Internal Medicine

## 2016-10-26 ENCOUNTER — Encounter (HOSPITAL_COMMUNITY): Payer: Medicare Other

## 2016-10-26 ENCOUNTER — Telehealth: Payer: Self-pay | Admitting: *Deleted

## 2016-10-26 DIAGNOSIS — I313 Pericardial effusion (noninflammatory): Secondary | ICD-10-CM | POA: Diagnosis present

## 2016-10-26 DIAGNOSIS — K922 Gastrointestinal hemorrhage, unspecified: Secondary | ICD-10-CM | POA: Diagnosis present

## 2016-10-26 DIAGNOSIS — K921 Melena: Secondary | ICD-10-CM | POA: Diagnosis present

## 2016-10-26 DIAGNOSIS — Q2733 Arteriovenous malformation of digestive system vessel: Secondary | ICD-10-CM | POA: Diagnosis not present

## 2016-10-26 DIAGNOSIS — I272 Pulmonary hypertension, unspecified: Secondary | ICD-10-CM | POA: Diagnosis present

## 2016-10-26 DIAGNOSIS — Z79899 Other long term (current) drug therapy: Secondary | ICD-10-CM

## 2016-10-26 DIAGNOSIS — Z90710 Acquired absence of both cervix and uterus: Secondary | ICD-10-CM | POA: Diagnosis not present

## 2016-10-26 DIAGNOSIS — Z981 Arthrodesis status: Secondary | ICD-10-CM

## 2016-10-26 DIAGNOSIS — Z8249 Family history of ischemic heart disease and other diseases of the circulatory system: Secondary | ICD-10-CM

## 2016-10-26 DIAGNOSIS — I350 Nonrheumatic aortic (valve) stenosis: Secondary | ICD-10-CM | POA: Diagnosis present

## 2016-10-26 DIAGNOSIS — D62 Acute posthemorrhagic anemia: Secondary | ICD-10-CM | POA: Diagnosis not present

## 2016-10-26 LAB — CBC WITH DIFFERENTIAL/PLATELET
BASOS PCT: 1 %
Basophils Absolute: 0.1 10*3/uL (ref 0–0.1)
EOS ABS: 0.1 10*3/uL (ref 0–0.7)
Eosinophils Relative: 1 %
HCT: 21.3 % — ABNORMAL LOW (ref 35.0–47.0)
HEMOGLOBIN: 7 g/dL — AB (ref 12.0–16.0)
Lymphocytes Relative: 20 %
Lymphs Abs: 1.1 10*3/uL (ref 1.0–3.6)
MCH: 31.2 pg (ref 26.0–34.0)
MCHC: 33 g/dL (ref 32.0–36.0)
MCV: 94.4 fL (ref 80.0–100.0)
MONOS PCT: 7 %
Monocytes Absolute: 0.4 10*3/uL (ref 0.2–0.9)
NEUTROS PCT: 71 %
Neutro Abs: 3.7 10*3/uL (ref 1.4–6.5)
Platelets: 178 10*3/uL (ref 150–440)
RBC: 2.26 MIL/uL — ABNORMAL LOW (ref 3.80–5.20)
RDW: 19.1 % — AB (ref 11.5–14.5)
WBC: 5.2 10*3/uL (ref 3.6–11.0)

## 2016-10-26 LAB — BASIC METABOLIC PANEL
Anion gap: 7 (ref 5–15)
BUN: 44 mg/dL — ABNORMAL HIGH (ref 6–20)
CHLORIDE: 111 mmol/L (ref 101–111)
CO2: 24 mmol/L (ref 22–32)
CREATININE: 0.87 mg/dL (ref 0.44–1.00)
Calcium: 8.4 mg/dL — ABNORMAL LOW (ref 8.9–10.3)
GFR calc non Af Amer: 59 mL/min — ABNORMAL LOW (ref >60)
GLUCOSE: 81 mg/dL (ref 65–99)
Potassium: 4 mmol/L (ref 3.5–5.1)
Sodium: 142 mmol/L (ref 135–145)

## 2016-10-26 LAB — PREPARE RBC (CROSSMATCH)

## 2016-10-26 MED ORDER — ONDANSETRON HCL 4 MG/2ML IJ SOLN: 4.0000 mg | Freq: Four times a day (QID) | INTRAMUSCULAR | Status: DC | PRN

## 2016-10-26 MED ORDER — HYDROCODONE-ACETAMINOPHEN 5-325 MG PO TABS: 1.0000 | ORAL_TABLET | ORAL | Status: DC | PRN

## 2016-10-26 MED ORDER — PANTOPRAZOLE SODIUM 40 MG IV SOLR
40.0000 mg | Freq: Two times a day (BID) | INTRAVENOUS | Status: DC
  Administered 2016-10-26 – 2016-10-27 (×2): 40 mg via INTRAVENOUS
  Filled 2016-10-26 (×2): qty 40

## 2016-10-26 MED ORDER — VITAMIN B-12 1000 MCG PO TABS
1000.0000 ug | ORAL_TABLET | Freq: Every day | ORAL | Status: DC
  Administered 2016-10-27: 1000 ug via ORAL
  Filled 2016-10-26: qty 1

## 2016-10-26 MED ORDER — SODIUM CHLORIDE 0.9 % IV SOLN
INTRAVENOUS | Status: DC
  Administered 2016-10-27: 03:00:00 via INTRAVENOUS

## 2016-10-26 MED ORDER — SENNOSIDES-DOCUSATE SODIUM 8.6-50 MG PO TABS: 1.0000 | ORAL_TABLET | Freq: Every evening | ORAL | Status: DC | PRN

## 2016-10-26 MED ORDER — ACETAMINOPHEN 650 MG RE SUPP: 650.0000 mg | Freq: Four times a day (QID) | RECTAL | Status: DC | PRN

## 2016-10-26 MED ORDER — SODIUM CHLORIDE 0.9 % IV SOLN
10.0000 mL/h | Freq: Once | INTRAVENOUS | Status: AC
  Administered 2016-10-26: 10 mL/h via INTRAVENOUS

## 2016-10-26 MED ORDER — BISACODYL 5 MG PO TBEC: 5.0000 mg | DELAYED_RELEASE_TABLET | Freq: Every day | ORAL | Status: DC | PRN

## 2016-10-26 MED ORDER — CHOLECALCIFEROL 10 MCG (400 UNIT) PO TABS
800.0000 [IU] | ORAL_TABLET | Freq: Every day | ORAL | Status: DC
  Administered 2016-10-27: 800 [IU] via ORAL
  Filled 2016-10-26: qty 2

## 2016-10-26 MED ORDER — ONDANSETRON HCL 4 MG PO TABS: 4.0000 mg | ORAL_TABLET | Freq: Four times a day (QID) | ORAL | Status: DC | PRN

## 2016-10-26 MED ORDER — FUROSEMIDE 10 MG/ML IJ SOLN
20.0000 mg | Freq: Once | INTRAMUSCULAR | Status: AC
  Administered 2016-10-26: 20 mg via INTRAVENOUS
  Filled 2016-10-26: qty 4

## 2016-10-26 MED ORDER — ACETAMINOPHEN 325 MG PO TABS: 650.0000 mg | ORAL_TABLET | Freq: Four times a day (QID) | ORAL | Status: DC | PRN

## 2016-10-26 NOTE — ED Triage Notes (Signed)
Pt had labs done with Dr Judithann SheenSparks with a low hemoglobin 7.3 today, pt reports dizziness, received a unit of blood Wednesday 10/24/16

## 2016-10-26 NOTE — H&P (Signed)
[facility] [place] [facility]   PATIENT NAME: Kelsey Le    MR#:  [id number]  DATE OF BIRTH:  1930-02-08  DATE OF ADMISSION:  10/26/2016  PRIMARY CARE PHYSICIAN: Marguarite Arbour, MD   REQUESTING/REFERRING PHYSICIAN: dr Darnelle Catalan  CHIEF COMPLAINT:   Dark color stools HISTORY OF PRESENT ILLNESS:  Kelsey Le  is a 83 y.o. female with a known history of recurrent GI bleeds due to AVMs and severe aortic stenosis who presents with above complaint. She saw her primary care physician this morning and hemoglobin was drawn which was at 7.3. She was asked to come to the ER for further evaluation and blood transfusion or go to the cancer Center for blood transfusion where she has frequently visited. The cancer Center was unable to fit her in the schedule today so she came to the ER.  She was recently admitted for dark-colored stools and underwent endoscopy which showed evidence of gastritis. She has had prior EGD, colonoscopy, capsule endoscopy which have been unrevealing. She was evaluated by GI and October 23 also complaining of dark-colored stools. She had a blood transfusion on October 24. She is no longer on aspirin due to the GI bleed. She does endorse dizziness, lightheadedness, shortness of breath and fatigue.      Her hemoglobin today is 7.0 in the emergency room. PAST MEDICAL HISTORY:   Past Medical History:  Diagnosis Date  . Aortic stenosis    a. TTE 8/17: nl EF, mod to sev AS w/ mean gradient 28 mmHg, valve area 0.97, mod pulm HTN, small pericardial effusion  . Arrhythmia   . Asthma   . AVM (arteriovenous malformation)    a. small intestine   . Broken back   . Broken wrist   . Hip fracture (HCC)    a. in the setting of mechanical fall  . Iron deficiency anemia    a. requiring periodic transfusions  . Syncope and collapse     PAST SURGICAL HISTORY:   Past Surgical History:  Procedure Laterality Date  . APPENDECTOMY    . BACK SURGERY      back fusion  . ESOPHAGOGASTRODUODENOSCOPY (EGD) WITH PROPOFOL N/A 11/15/2015   Procedure: ESOPHAGOGASTRODUODENOSCOPY (EGD) WITH PROPOFOL;  Surgeon: [name], MD;  Location: [facility] ENDOSCOPY;  Service: Endoscopy;  Laterality: N/A;  . ESOPHAGOGASTRODUODENOSCOPY (EGD) WITH PROPOFOL N/A 10/12/2016   Procedure: ESOPHAGOGASTRODUODENOSCOPY (EGD) WITH PROPOFOL;  Surgeon: [name], MD;  Location: [facility] ENDOSCOPY;  Service: Endoscopy;  Laterality: N/A;  . FRACTURE SURGERY    . GIVENS CAPSULE STUDY N/A 02/20/2016   Procedure: GIVENS CAPSULE STUDY;  Surgeon: Scot Jun, MD;  Location: [facility] ENDOSCOPY;  Service: Endoscopy;  Laterality: N/A;  . HIP FRACTURE SURGERY    . INTRAMEDULLARY (IM) NAIL INTERTROCHANTERIC Right 11/18/2015   Procedure: INTRAMEDULLARY (IM) NAIL INTERTROCHANTRIC;  Surgeon: Juanell Fairly, MD;  Location: [facility] ORS;  Service: Orthopedics;  Laterality: Right;  . KYPHOPLASTY N/A 08/19/2014   Procedure: KYPHOPLASTY;  Surgeon: Kennedy Bucker, MD;  Location: [facility] ORS;  Service: Orthopedics;  Laterality: N/A;  . PARTIAL HYSTERECTOMY    . RIGHT/LEFT HEART CATH AND CORONARY ANGIOGRAPHY N/A 10/05/2016   Procedure: RIGHT/LEFT HEART CATH AND CORONARY ANGIOGRAPHY;  Surgeon: Kathleene Hazel, MD;  Location: [facility] INVAS[facility] LAB;  Service: Cardiovascular;  Laterality: N/A;  . TUBAL LIGATION      SOCIAL HISTORY:   Social History  Substance Use Topics  . Smoking status: Never Smoker  . Smokeless tobacco: Never Used  .  Alcohol use No    FAMILY HISTORY:   Family History  Problem Relation Age of Onset  . Heart block Mother   . Heart failure Father   . Heart disease Sister   . Breast cancer Maternal Aunt     DRUG ALLERGIES:   Allergies  Allergen Reactions  . Aspirin     GI bleeding   . Penicillins Rash and Other (See Comments)    Antibiotics Has patient had a PCN reaction causing immediate rash, facial/tongue/throat swelling, SOB or lightheadedness with hypotension:  Unknown Has patient had a PCN reaction causing severe rash involving mucus membranes or skin necrosis: Unknown Has patient had a PCN reaction that required hospitalization: Unknown Has patient had a PCN reaction occurring within the last 10 years: No Childhood reaction  If all of the above answers are "NO", then may proceed with Cephalosporin use.     REVIEW OF SYSTEMS:   Review of Systems  Constitutional: Positive for malaise/fatigue. Negative for chills and fever.  HENT: Negative.  Negative for ear discharge, ear pain, hearing loss, nosebleeds and sore throat.   Eyes: Negative.  Negative for blurred vision and pain.  Respiratory: Negative.  Negative for cough, hemoptysis, shortness of breath and wheezing.   Cardiovascular: Negative.  Negative for chest pain, palpitations and leg swelling.  Gastrointestinal: Positive for melena. Negative for abdominal pain, blood in stool, diarrhea, nausea and vomiting.  Genitourinary: Negative.  Negative for dysuria.  Musculoskeletal: Negative.  Negative for back pain.  Skin: Negative.   Neurological: Positive for dizziness and weakness. Negative for tremors, speech change, focal weakness, seizures and headaches.  Endo/Heme/Allergies: Negative.  Does not bruise/bleed easily.  Psychiatric/Behavioral: Negative.  Negative for depression, hallucinations and suicidal ideas.    MEDICATIONS AT HOME:   Prior to Admission medications   Medication Sig Start Date End Date Taking? Authorizing Provider  acetaminophen (TYLENOL) 325 MG tablet Take 325 mg by mouth every 6 (six) hours as needed (for pain.).    [provider]  cholecalciferol (VITAMIN D) 400 units TABS tablet Take 800 Units by mouth daily.     [provider]  meclizine (ANTIVERT) 25 MG tablet Take 1 tablet (25 mg total) by mouth 3 (three) times daily as needed for dizziness. Patient not taking: Reported on 10/23/2016 09/29/16   Irean HongSung, Jade J, MD  pantoprazole (PROTONIX) 40 MG  tablet Take 1 tablet (40 mg total) by mouth daily. Patient not taking: Reported on 10/23/2016 10/12/16   Shaune Pollackhen, Qing, MD  vitamin B-12 (CYANOCOBALAMIN) 1000 MCG tablet Take 1,000 mcg by mouth daily.    [provider]      VITAL SIGNS:  Blood pressure (!) 120/43, pulse (!) 101, temperature 98.5 F (36.9 C), resp. rate 16, height 5\' 5"  (1.651 m), weight 51.7 kg (114 lb), SpO2 98 %.  PHYSICAL EXAMINATION:   Physical Exam  Constitutional: She is oriented to person, place, and time. No distress.  Thin and frail appearing  HENT:  Head: Normocephalic.  Eyes: No scleral icterus.  Neck: Normal range of motion. Neck supple. No JVD present. No tracheal deviation present.  Cardiovascular: Normal rate and regular rhythm.  Exam reveals no gallop and no friction rub.   Murmur heard. Pulmonary/Chest: Effort normal and breath sounds normal. No respiratory distress. She has no wheezes. She has no rales. She exhibits no tenderness.  Abdominal: Soft. Bowel sounds are normal. She exhibits no distension and no mass. There is no tenderness. There is no rebound and no guarding.  Musculoskeletal: Normal range of motion. She exhibits no edema.  Neurological: She is alert and oriented to person, place, and time.  Skin: Skin is warm. No rash noted. No erythema.  Psychiatric: Affect and judgment normal.      LABORATORY PANEL:   CBC  Recent Labs Lab 10/23/16 1044  WBC 7.6  HGB 7.9*  HCT 23.7*  PLT 211   ------------------------------------------------------------------------------------------------------------------  Chemistries  No results for input(s): NA, K, CL, CO2, GLUCOSE, BUN, CREATININE, CALCIUM, MG, AST, ALT, ALKPHOS, BILITOT in the last 168 hours.  Invalid input(s): GFRCGP ------------------------------------------------------------------------------------------------------------------  Cardiac Enzymes No results for input(s): TROPONINI in the last 168  hours. ------------------------------------------------------------------------------------------------------------------  RADIOLOGY:  No results found.  EKG:  none  IMPRESSION AND PLAN:   83 year old female with a history of recurrent GI bleeds.thought to be due to AVMs and severe aortic stenosis who presents from PCP office due to anemia.  1. Acute on chronic iron deficiency anemia due to GI blood loss Patient is consented for blood transfusion will be transfused 2 units PRBC CBC after blood transfusions  2. GI bleed: Patient underwent endoscopy recently which showed gastritis She has had full GI workup including EGD, colonoscopy and capsule endoscopy. No GI consultation at this time as per family request because management would not change since she recently had GI workup with EGD. Continue PPI She cannot be on aspirin due to GI bleed.   3. Severe aortic stenosis: She is closely followed by [facility] cardiology and her last on this visit was on October 19.   All the records are reviewed and case discussed with ED provider. Management plans discussed with the patient and she is in agreement  CODE STATUS: full  TOTAL TIME TAKING CARE OF THIS PATIENT: 43 minutes.    Roni Scow M.D on 10/26/2016 at 4:49 PM  Between 7am to 6pm - Pager - [phone]  After 6pm go to [email or web address] - Social research officer, government  [facility]  Office  [phone]  CC: Primary care physician; Marguarite Arbour, MD

## 2016-10-26 NOTE — ED Triage Notes (Signed)
FIRST NURSE NOTE-sent by dr sparks per family for transfusion. hgb 7.9. Walked in. Placed in wheelchair at first nurse.

## 2016-10-26 NOTE — Telephone Encounter (Signed)
Patient seen by Dr Judithann SheenSparks today and called her this afternoon to have her call us regarding hgb drop to 7.3and needing a blood transfusion. This is s/p blood transfusion Wed and she was 7.9 pre transfusion. Discussed with Dr Orlie DakinFinnegan and patient's daughter instructed to take patient to ER.  She states she will take her to ER

## 2016-10-26 NOTE — ED Provider Notes (Signed)
[facility] Emergency Department Provider Note   ____________________________________________   First MD Initiated Contact with Patient 10/26/16 1546     (approximate)  I have reviewed the triage vital signs and the nursing notes.   HISTORY  Chief Complaint Abnormal Lab    HPI Kelsey Le is a 83 y.o. female With a known history of GI bleed based on the patient's family's description sounds like arteriovenous malformation wall: Patient was scheduled to have aortic fell stenosis surgery but cannot do that if she cannot tolerate any blood thinners because she begins bleeding. She is currently having black tarry stools is pale and has a hemoglobin of 7.5 in the doctor's office. She was sent here for transfusion.   Past Medical History:  Diagnosis Date  . Aortic stenosis    a. TTE 8/17: nl EF, mod to sev AS w/ mean gradient 28 mmHg, valve area 0.97, mod pulm HTN, small pericardial effusion  . Arrhythmia   . Asthma   . AVM (arteriovenous malformation)    a. small intestine   . Broken back   . Broken wrist   . Hip fracture (HCC)    a. in the setting of mechanical fall  . Iron deficiency anemia    a. requiring periodic transfusions  . Syncope and collapse     Patient Active Problem List   Diagnosis Date Noted  . Blood in stool   . Gastritis without bleeding   . Anemia 10/11/2016  . Malnutrition of moderate degree 10/11/2016  . UGIB (upper gastrointestinal bleed)   . GIB (gastrointestinal bleeding) 10/10/2016  . Iron deficiency anemia 01/15/2016  . Hip fracture (HCC) 11/17/2015  . Acute posthemorrhagic anemia 11/16/2015  . H/O transfusion of packed red blood cells 11/16/2015  . Elevated troponin 11/16/2015  . Severe aortic stenosis 11/16/2015  . Thrombocytopenia (HCC) 11/16/2015  . Generalized weakness 11/16/2015  . Melena 11/14/2015  . Lichen sclerosus 10/31/2015  . Constipation 10/24/2015  . DDD (degenerative disc disease), lumbar  11/02/2014  . Mild intermittent asthma without complication 03/05/2014  . GI bleed 03/01/2014  . Lumbar radiculitis 12/16/2013  . Aortic stenosis 07/27/2013  . Osteoarthritis of hip 07/06/2013  . Trochanteric bursitis 07/06/2013  . Heart murmur 04/26/2013  . SOB (shortness of breath) 04/26/2013  . Elevated blood pressure reading without diagnosis of hypertension 04/26/2013  . Left wrist fracture 12/05/2008    Past Surgical History:  Procedure Laterality Date  . APPENDECTOMY    . BACK SURGERY     back fusion  . ESOPHAGOGASTRODUODENOSCOPY (EGD) WITH PROPOFOL N/A 11/15/2015   Procedure: ESOPHAGOGASTRODUODENOSCOPY (EGD) WITH PROPOFOL;  Surgeon: [name], MD;  Location: [facility] ENDOSCOPY;  Service: Endoscopy;  Laterality: N/A;  . ESOPHAGOGASTRODUODENOSCOPY (EGD) WITH PROPOFOL N/A 10/12/2016   Procedure: ESOPHAGOGASTRODUODENOSCOPY (EGD) WITH PROPOFOL;  Surgeon: [name], MD;  Location: [facility] ENDOSCOPY;  Service: Endoscopy;  Laterality: N/A;  . FRACTURE SURGERY    . GIVENS CAPSULE STUDY N/A 02/20/2016   Procedure: GIVENS CAPSULE STUDY;  Surgeon: Scot Jun, MD;  Location: [facility] ENDOSCOPY;  Service: Endoscopy;  Laterality: N/A;  . HIP FRACTURE SURGERY    . INTRAMEDULLARY (IM) NAIL INTERTROCHANTERIC Right 11/18/2015   Procedure: INTRAMEDULLARY (IM) NAIL INTERTROCHANTRIC;  Surgeon: Juanell Fairly, MD;  Location: [facility] ORS;  Service: Orthopedics;  Laterality: Right;  . KYPHOPLASTY N/A 08/19/2014   Procedure: KYPHOPLASTY;  Surgeon: Kennedy Bucker, MD;  Location: [facility] ORS;  Service: Orthopedics;  Laterality: N/A;  . PARTIAL HYSTERECTOMY    . RIGHT/LEFT HEART  CATH AND CORONARY ANGIOGRAPHY N/A 10/05/2016   Procedure: RIGHT/LEFT HEART CATH AND CORONARY ANGIOGRAPHY;  Surgeon: Kathleene Hazel, MD;  Location: [facility] INVASIVE CV LAB;  Service: Cardiovascular;  Laterality: N/A;  . TUBAL LIGATION      Prior to Admission medications   Medication Sig Start Date End Date Taking? Authorizing  Provider  acidophilus (RISAQUAD) CAPS capsule Take 1 capsule by mouth daily.   Yes [provider]  cholecalciferol (VITAMIN D) 400 units TABS tablet Take 800 Units by mouth daily.    Yes [provider]  vitamin B-12 (CYANOCOBALAMIN) 1000 MCG tablet Take 1,000 mcg by mouth daily.   Yes [provider]  acetaminophen (TYLENOL) 325 MG tablet Take 325 mg by mouth every 6 (six) hours as needed (for pain.).    [provider]  meclizine (ANTIVERT) 25 MG tablet Take 1 tablet (25 mg total) by mouth 3 (three) times daily as needed for dizziness. Patient not taking: Reported on 10/23/2016 09/29/16   Irean Hong, MD  pantoprazole (PROTONIX) 40 MG tablet Take 1 tablet (40 mg total) by mouth daily. Patient not taking: Reported on 10/23/2016 10/12/16   Shaune Pollack, MD    Allergies Aspirin and Penicillins  Family History  Problem Relation Age of Onset  . Heart block Mother   . Heart failure Father   . Heart disease Sister   . Breast cancer Maternal Aunt     Social History Social History  Substance Use Topics  . Smoking status: Never Smoker  . Smokeless tobacco: Never Used  . Alcohol use No    Review of Systems  Constitutional: No fever/chills Eyes: No visual changes. ENT: No sore throat. Cardiovascular: Denies chest pain. Respiratory: Denies shortness of breath. Gastrointestinal: No abdominal pain.  No nausea, no vomiting.  No diarrhea.  No constipation. Genitourinary: Negative for dysuria. Musculoskeletal: Negative for back pain. Skin: Negative for rash. Neurological: Negative for headaches, focal weakness  ____________________________________________   PHYSICAL EXAM:  VITAL SIGNS: ED Triage Vitals  Enc Vitals Group     BP 10/26/16 1543 (!) 120/43     Pulse Rate 10/26/16 1543 (!) 101     Resp 10/26/16 1543 16     Temp 10/26/16 1543 98.5 F (36.9 C)     Temp src --      SpO2 10/26/16 1543 98 %     Weight 10/26/16 1540 114 lb (51.7 kg)      Height 10/26/16 1540 5\' 5"  (1.651 m)     Head Circumference --      Peak Flow --      Pain Score --      Pain Loc --      Pain Edu? --      Excl. in GC? --    Constitutional: Alert and oriented. Well appearing and in no acute distress. Eyes: Conjunctivae are normal.  Head: Atraumatic. Nose: No congestion/rhinnorhea. Mouth/Throat: Mucous membranes are moist.  Oropharynx non-erythematous. Neck: No stridor.  }Cardiovascular: Normal rate, regular rhythm. Grossly normal heart sounds.  Good peripheral circulation. Respiratory: Normal respiratory effort.  No retractions. Lungs CTAB. Gastrointestinal: Soft and nontender. No distention. No abdominal bruits. No CVA tenderness. Musculoskeletal: No lower extremity tenderness nor edema.  No joint effusions. Neurologic:  Normal speech and language. No gross focal neurologic deficits are appreciated. No gait instability. Skin:  Skin is warm, dry and intactbut pale. No rash noted.   ____________________________________________   LABS (all labs ordered are listed, but only abnormal results are displayed)  Labs Reviewed  BASIC METABOLIC PANEL - Abnormal; Notable for the following:       Result Value   BUN 44 (*)    Calcium 8.4 (*)    GFR calc non Af Amer 59 (*)    All other components within normal limits  CBC WITH DIFFERENTIAL/PLATELET - Abnormal; Notable for the following:    RBC 2.26 (*)    Hemoglobin 7.0 (*)    HCT 21.3 (*)    RDW 19.1 (*)    All other components within normal limits  VITAMIN B12  FOLATE  IRON AND TIBC  FERRITIN  RETICULOCYTES  CBC  BASIC METABOLIC PANEL  PREPARE RBC (CROSSMATCH)  TYPE AND SCREEN   ____________________________________________  EKG   ____________________________________________  RADIOLOGY   ____________________________________________   PROCEDURES  Procedure(s) performed:  Procedures  Critical Care performed:   ____________________________________________   INITIAL  IMPRESSION / ASSESSMENT AND PLAN / ED COURSE  review of patient's old records show that she's had intermittent dark stools in the past had a negative EGD colonoscopy Also endoscopy in [place] 2015 and a more recent Endoscopy That Was Also Negative. Again I Discussed the Patient's Care with Her and Her Family Reports She Is Here for Transfusion. The Aware of the Risks and Benefits of This Is She's Had One before.     ____________________________________________   FINAL CLINICAL IMPRESSION(S) / ED DIAGNOSES  Final diagnoses:  Gastrointestinal hemorrhage with melena      NEW MEDICATIONS STARTED DURING THIS VISIT:  Current Discharge Medication List       Note:  This document was prepared using Dragon voice recognition software and may include unintentional dictation errors.    Arnaldo NatalMalinda, Abrey Bradway F, MD 10/27/16 [phone]

## 2016-10-27 DIAGNOSIS — K921 Melena: Secondary | ICD-10-CM | POA: Diagnosis not present

## 2016-10-27 DIAGNOSIS — D62 Acute posthemorrhagic anemia: Secondary | ICD-10-CM | POA: Diagnosis not present

## 2016-10-27 LAB — BPAM RBC
BLOOD PRODUCT EXPIRATION DATE: 201811052359
Blood Product Expiration Date: 201811052359
ISSUE DATE / TIME: 201810261843
ISSUE DATE / TIME: 201810262340
UNIT TYPE AND RH: 600
Unit Type and Rh: 600

## 2016-10-27 LAB — BASIC METABOLIC PANEL
Anion gap: 5 (ref 5–15)
BUN: 41 mg/dL — ABNORMAL HIGH (ref 6–20)
CALCIUM: 8.2 mg/dL — AB (ref 8.9–10.3)
CO2: 26 mmol/L (ref 22–32)
CREATININE: 0.87 mg/dL (ref 0.44–1.00)
Chloride: 112 mmol/L — ABNORMAL HIGH (ref 101–111)
GFR, EST NON AFRICAN AMERICAN: 59 mL/min — AB (ref >60)
GLUCOSE: 96 mg/dL (ref 65–99)
Potassium: 3.7 mmol/L (ref 3.5–5.1)
Sodium: 143 mmol/L (ref 135–145)

## 2016-10-27 LAB — TYPE AND SCREEN
ABO/RH(D): A POS
Antibody Screen: NEGATIVE
UNIT DIVISION: 0
Unit division: 0

## 2016-10-27 LAB — CBC
HEMATOCRIT: 28.4 % — AB (ref 35.0–47.0)
Hemoglobin: 9.9 g/dL — ABNORMAL LOW (ref 12.0–16.0)
MCH: 31.7 pg (ref 26.0–34.0)
MCHC: 35 g/dL (ref 32.0–36.0)
MCV: 90.7 fL (ref 80.0–100.0)
PLATELETS: 153 10*3/uL (ref 150–440)
RBC: 3.13 MIL/uL — ABNORMAL LOW (ref 3.80–5.20)
RDW: 17.2 % — AB (ref 11.5–14.5)
WBC: 7 10*3/uL (ref 3.6–11.0)

## 2016-10-27 LAB — RETICULOCYTES
RBC.: 3.15 MIL/uL — ABNORMAL LOW (ref 3.80–5.20)
RETIC COUNT ABSOLUTE: 88.2 10*3/uL (ref 19.0–183.0)
RETIC CT PCT: 2.8 % (ref 0.4–3.1)

## 2016-10-27 LAB — IRON AND TIBC
IRON: 180 ug/dL — AB (ref 28–170)
Saturation Ratios: 70 % — ABNORMAL HIGH (ref 10.4–31.8)
TIBC: 259 ug/dL (ref 250–450)
UIBC: 79 ug/dL

## 2016-10-27 LAB — FERRITIN: FERRITIN: 32 ng/mL (ref 11–307)

## 2016-10-27 LAB — FOLATE: FOLATE: 12.5 ng/mL (ref >5.9)

## 2016-10-27 LAB — VITAMIN B12: VITAMIN B 12: 1091 pg/mL — AB (ref 180–914)

## 2016-10-27 NOTE — Discharge Summary (Signed)
[facility] [place] [facility]   PATIENT NAME: Kelsey Le    MR#:  [id number]  DATE OF BIRTH:  1930/06/05  DATE OF ADMISSION:  10/26/2016 ADMITTING PHYSICIAN: Adrian SaranSital Mody, MD  DATE OF DISCHARGE: 10/27/2016  PRIMARY CARE PHYSICIAN: Marguarite ArbourSparks, Jeffrey D, MD    ADMISSION DIAGNOSIS:  Gastrointestinal hemorrhage with melena [K92.1]  DISCHARGE DIAGNOSIS:  Acute on chronic anemia suspected from slow gi bleed due to known h/o AVM's s/p 2 unit BT  SECONDARY DIAGNOSIS:   Past Medical History:  Diagnosis Date  . Aortic stenosis    a. TTE 8/17: nl EF, mod to sev AS w/ mean gradient 28 mmHg, valve area 0.97, mod pulm HTN, small pericardial effusion  . Arrhythmia   . Asthma   . AVM (arteriovenous malformation)    a. small intestine   . Broken back   . Broken wrist   . Hip fracture (HCC)    a. in the setting of mechanical fall  . Iron deficiency anemia    a. requiring periodic transfusions  . Syncope and collapse     HOSPITAL COURSE:   83 year old female with a history of recurrent GI bleeds.thought to be due to AVMs and severe aortic stenosis who presents from PCP office due to anemia.  1. Acute on chronic iron deficiency anemia due to GI blood loss from AVM's Patient is transfused 2 units PRBC Came in with hgb of 7.6--7.0--2 units PRBC--9.9 Pt feels a lot better  2. Know h/o chronic anemia and slow GI bleed: Patient underwent endoscopy recently which showed gastritis She has had full GI workup including EGD, colonoscopy and capsule endoscopy. No GI consultation at this time as per family request because management would not change since she recently had GI workup with EGD. Continue PPI She cannot be on aspirin due to GI bleed.   3. Severe aortic stenosis: She is [facility] and her last on this visit was on October 19.  Overall stable Pt and dter agree with d/c plans CONSULTS OBTAINED:    DRUG ALLERGIES:    Allergies  Allergen Reactions  . Aspirin     GI bleeding   . Penicillins Rash and Other (See Comments)    Has patient had a PCN reaction causing immediate rash, facial/tongue/throat swelling, SOB or lightheadedness with hypotension: Yes Has patient had a PCN reaction causing severe rash involving mucus membranes or skin necrosis: No Has patient had a PCN reaction that required hospitalization: No Has patient had a PCN reaction occurring within the last 10 years: No If all of the above answers are "NO", then may proceed with Cephalosporin use.     DISCHARGE MEDICATIONS:   Current Discharge Medication List    CONTINUE these medications which have NOT CHANGED   Details  acidophilus (RISAQUAD) CAPS capsule Take 1 capsule by mouth daily.    cholecalciferol (VITAMIN D) 400 units TABS tablet Take 800 Units by mouth daily.     vitamin B-12 (CYANOCOBALAMIN) 1000 MCG tablet Take 1,000 mcg by mouth daily.    acetaminophen (TYLENOL) 325 MG tablet Take 325 mg by mouth every 6 (six) hours as needed (for pain.).    meclizine (ANTIVERT) 25 MG tablet Take 1 tablet (25 mg total) by mouth 3 (three) times daily as needed for dizziness. Qty: 15 tablet, Refills: 0    pantoprazole (PROTONIX) 40 MG tablet Take 1 tablet (40 mg total) by mouth daily. Qty: 30 tablet, Refills: 0  If you experience worsening of your admission symptoms, develop shortness of breath, life threatening emergency, suicidal or homicidal thoughts you must seek medical attention immediately by calling 911 or calling your MD immediately  if symptoms less severe.  You Must read complete instructions/literature along with all the possible adverse reactions/side effects for all the Medicines you take and that have been prescribed to you. Take any new Medicines after you have completely understood and accept all the possible adverse reactions/side effects.   Please note  You were cared for by a hospitalist during your  hospital stay. If you have any questions about your discharge medications or the care you received while you were in the hospital after you are discharged, you can call the unit and asked to speak with the hospitalist on call if the hospitalist that took care of you is not available. Once you are discharged, your primary care physician will handle any further medical issues. Please note that NO REFILLS for any discharge medications will be authorized once you are discharged, as it is imperative that you return to your primary care physician (or establish a relationship with a primary care physician if you do not have one) for your aftercare needs so that they can reassess your need for medications and monitor your lab values. Today   SUBJECTIVE  Doing well Can I go home?   VITAL SIGNS:  Blood pressure (!) 108/47, pulse 66, temperature 98 F (36.7 C), temperature source Oral, resp. rate 18, height 5\' 5"  (1.651 m), weight 52.8 kg (116 lb 8 oz), SpO2 97 %.  I/O:   Intake/Output Summary (Last 24 hours) at 10/27/16 0935 Last data filed at 10/27/16 0907  Gross per 24 hour  Intake           971.17 ml  Output             2870 ml  Net         -1898.83 ml    PHYSICAL EXAMINATION:  GENERAL:  83 y.o.-year-old patient lying in the bed with no acute distress.  EYES: Pupils equal, round, reactive to light and accommodation. No scleral icterus. Extraocular muscles intact.  HEENT: Head atraumatic, normocephalic. Oropharynx and nasopharynx clear.  NECK:  Supple, no jugular venous distention. No thyroid enlargement, no tenderness.  LUNGS: Normal breath sounds bilaterally, no wheezing, rales,rhonchi or crepitation. No use of accessory muscles of respiration.  CARDIOVASCULAR: S1, S2 normal. No murmurs, rubs, or gallops.  ABDOMEN: Soft, non-tender, non-distended. Bowel sounds present. No organomegaly or mass.  EXTREMITIES: No pedal edema, cyanosis, or clubbing.  NEUROLOGIC: Cranial nerves II through XII are  intact. Muscle strength 5/5 in all extremities. Sensation intact. Gait not checked.  PSYCHIATRIC: The patient is alert and oriented x 3.  SKIN: No obvious rash, lesion, or ulcer.   DATA REVIEW:   CBC   Recent Labs Lab 10/27/16 0356  WBC 7.0  HGB 9.9*  HCT 28.4*  PLT 153    Chemistries   Recent Labs Lab 10/27/16 0356  NA 143  K 3.7  CL 112*  CO2 26  GLUCOSE 96  BUN 41*  CREATININE 0.87  CALCIUM 8.2*    Microbiology Results   No results found for this or any previous visit (from the past 240 hour(s)).  RADIOLOGY:  No results found.   Management plans discussed with the patient, family and they are in agreement.  CODE STATUS:     Code Status Orders        Start  Ordered   10/26/16 1819  Full code  Continuous     10/26/16 1818    Code Status History    Date Active Date Inactive Code Status Order ID Comments User Context   10/11/2016  1:06 AM 10/12/2016  7:49 PM Full Code [id number]  Gery Pray, MD Inpatient   10/05/2016  2:26 PM 10/05/2016  9:35 PM Full Code [id number]  Kathleene Hazel, MD Inpatient   11/17/2015 10:51 PM 11/18/2015  5:06 PM Full Code [id number]  Juanell Fairly, MD Inpatient   11/17/2015 10:51 PM 11/17/2015 10:51 PM Full Code [id number]  Gracelyn Nurse, MD Inpatient   11/14/2015  7:19 PM 11/16/2015  8:25 PM Full Code [id number]  Enedina Finner, MD Inpatient   08/19/2014  2:52 PM 08/19/2014  6:42 PM Full Code [id number]  Kennedy Bucker, MD Inpatient      TOTAL TIME TAKING CARE OF THIS PATIENT: *40 minutes.    Nethaniel Mattie M.D on 10/27/2016 at 9:35 AM  Between 7am to 6pm - Pager - [phone] After 6pm go to [email or web address] - Social research officer, government  [facility]  Office  [phone]  CC: Primary care physician; Marguarite Arbour, MD

## 2016-10-27 NOTE — Discharge Instructions (Signed)
Gastrointestinal Bleeding °Gastrointestinal (GI) bleeding is bleeding somewhere along the digestive tract, between the mouth and anus. This can be caused by various problems. The severity of these problems can range from mild to serious or even life-threatening. If you have GI bleeding, you may find blood in your stools (feces), you may have black stools, or you may vomit blood. If there is a lot of bleeding, you may need to stay in the hospital. °What are the causes? °This condition may be caused by: °· Esophagitis. This is inflammation, irritation, or swelling of the esophagus. °· Hemorrhoids. These are swollen veins in the rectum. °· Anal fissures. These are areas of painful tearing that are often caused by passing hard stool. °· Diverticulosis. These are pouches that form on the colon over time, with age, and may bleed a lot. °· Diverticulitis. This is inflammation in areas with diverticulosis. It can cause pain, fever, and bloody stools, although bleeding may be mild. °· Polyps and cancer. Colon cancer often starts out as precancerous polyps. °· Gastritis and ulcers. With these, bleeding may come from the upper GI tract, near the stomach. ° °What are the signs or symptoms? °Symptoms of this condition may include: °· Bright red blood in your vomit, or vomit that looks like coffee grounds. °· Bloody, black, or tarry stools. °? Bleeding from the lower GI tract will usually cause red or maroon blood in the stools. °? Bleeding from the upper GI tract may cause black, tarry, often bad-smelling stools. °? In certain cases, if the bleeding is fast enough, the stools may be red. °· Pain or cramping in the abdomen. ° °How is this diagnosed? °This condition may be diagnosed based on: °· Medical history and physical exam. °· Various tests, such as: °? Blood tests. °? X-rays and other imaging tests. °? Esophagogastroduodenoscopy (EGD). In this test, a flexible, lighted tube is used to look at your esophagus, stomach, and  small intestine. °? Colonoscopy. In this test, a flexible, lighted tube is used to look at your colon. ° °How is this treated? °Treatment for this condition depends on the cause of the bleeding. For example: °· For bleeding from the esophagus, stomach, small intestine, or colon, the health care provider doing your EGD or colonoscopy may be able to stop the bleeding as part of the procedure. °· Inflammation or infection of the colon can be treated with medicines. °· Certain rectal problems can be treated with creams, suppositories, or warm baths. °· Surgery is sometimes needed. °· Blood transfusions are sometimes needed if a lot of blood has been lost. ° °If bleeding is slow, you may be allowed to go home. If there is a lot of bleeding, you will need to stay in the hospital for observation. °Follow these instructions at home: °· Take over-the-counter and prescription medicines only as told by your health care provider. °· Eat foods that are high in fiber. This will help to keep your stools soft. These foods include whole grains, legumes, fruits, and vegetables. Eating 1-3 prunes each day works well for many people. °· Drink enough fluid to keep your urine clear or pale yellow. °· Keep all follow-up visits as told by your health care provider. This is important. °Contact a health care provider if: °· Your symptoms do not improve. °Get help right away if: °· Your bleeding increases. °· You feel light-headed or you faint. °· You feel weak. °· You have severe cramps in your back or abdomen. °· You pass large blood clots in your stool. °·   Your symptoms are getting worse. This information is not intended to replace advice given to you by your health care provider. Make sure you discuss any questions you have with your health care provider. Document Released: 12/16/1999 Document Revised: 05/18/2015 Document Reviewed: 06/07/2014 Elsevier Interactive Patient Education  2018 [employer]   Follow all MD discharge  instructions. Take all medications as prescribed. Keep all follow up appointments. If your symptoms return, call your doctor. If you experience any new symptoms that are of concern to you or that are bothersome to you, call your doctor. For all questions and/or concerns, call your doctor.  If you have a medical emergency, call 911

## 2016-10-27 NOTE — Progress Notes (Signed)
PT Cancellation Note  Patient Details Name: Kelsey Le MRN: [id number] DOB: [birth year]   Cancelled Treatment:    Reason Eval/Treat Not Completed:  (Chart reviewed for initial evaluation.  Per notes, patient already discharged from unit at this time.)   Leilany Digeronimo H. Manson PasseyBrown, PT, DPT, NCS 10/27/16, 10:45 AM [phone]

## 2016-10-27 NOTE — Progress Notes (Signed)
Pt d/c home; d/c instructions reviewed w/ pt; pt understanding was verbalized; IV removed, catheter in tact, gauze dressing applied; all pt questions answered; pt verbalized that all pt belongings were accounted for; pt left unit via wheelchair accompanied by staff 

## 2016-10-29 ENCOUNTER — Other Ambulatory Visit: Payer: Self-pay

## 2016-10-29 MED ORDER — BIS SUBCIT-METRONID-TETRACYC 140-125-125 MG PO CAPS: 3.0000 | ORAL_CAPSULE | Freq: Three times a day (TID) | ORAL | 0 refills | Status: DC

## 2016-10-29 NOTE — Progress Notes (Deleted)
[place]  [phone]  Fax:(336) [phone]     SHADA NIENABER DOB: 19-Feb-1930  MR#: [id number]  YYT#:[id number]  Patient Care Team: Idelle Crouch, MD as PCP - General (Internal Medicine) Lloyd Huger, MD as Consulting Physician (Oncology)  Chief complaint: Iron deficiency anemia secondary to GI blood loss.  INTERVAL HISTORY: Patient returns to clinic today for further evaluation and laboratory work. She does not complain of weakness or fatigue today. She currently feels well and is asymptomatic. He has no neurologic complaints. She denies any recent fevers or illnesses. She has no chest pain, shortness of breath, or cough.  She denies nausea, vomiting, constipation, or diarrhea. She denies any recent melena or hematochezia. She has no urinary complaints. Patient offers no specific complaints today.   REVIEW OF SYSTEMS:   Review of Systems  Constitutional: Negative for chills, fever and malaise/fatigue.  Respiratory: Negative.  Negative for cough, hemoptysis and shortness of breath.   Cardiovascular: Negative.  Negative for chest pain, palpitations and leg swelling.  Gastrointestinal: Negative for blood in stool, constipation, diarrhea, melena, nausea and vomiting.  Genitourinary: Negative.  Negative for hematuria.  Musculoskeletal: Positive for joint pain.  Skin: Negative.  Negative for rash.  Neurological: Negative.  Negative for dizziness, weakness and headaches.  Endo/Heme/Allergies: Negative.   Psychiatric/Behavioral: Negative.  The patient is not nervous/anxious.     As per HPI. Otherwise, a complete review of systems is negative.  ONCOLOGY HISTORY:  No history exists.    PAST MEDICAL HISTORY: Past Medical History:  Diagnosis Date  . Aortic stenosis    a. TTE 8/17: nl EF, mod to sev AS w/ mean gradient 28 mmHg, valve area 0.97, mod pulm HTN, small pericardial effusion  . Arrhythmia   . Asthma   . AVM (arteriovenous malformation)      a. small intestine   . Broken back   . Broken wrist   . Hip fracture ([place])    a. in the setting of mechanical fall  . Iron deficiency anemia    a. requiring periodic transfusions  . Syncope and collapse     PAST SURGICAL HISTORY: Past Surgical History:  Procedure Laterality Date  . APPENDECTOMY    . BACK SURGERY     back fusion  . ESOPHAGOGASTRODUODENOSCOPY (EGD) WITH PROPOFOL N/A 11/15/2015   Procedure: ESOPHAGOGASTRODUODENOSCOPY (EGD) WITH PROPOFOL;  Surgeon: Lucilla Lame, MD;  Location: [facility] ENDOSCOPY;  Service: Endoscopy;  Laterality: N/A;  . ESOPHAGOGASTRODUODENOSCOPY (EGD) WITH PROPOFOL N/A 10/12/2016   Procedure: ESOPHAGOGASTRODUODENOSCOPY (EGD) WITH PROPOFOL;  Surgeon: Lucilla Lame, MD;  Location: [facility] ENDOSCOPY;  Service: Endoscopy;  Laterality: N/A;  . FRACTURE SURGERY    . GIVENS CAPSULE STUDY N/A 02/20/2016   Procedure: GIVENS CAPSULE STUDY;  Surgeon: Manya Silvas, MD;  Location: [facility] ENDOSCOPY;  Service: Endoscopy;  Laterality: N/A;  . HIP FRACTURE SURGERY    . INTRAMEDULLARY (IM) NAIL INTERTROCHANTERIC Right 11/18/2015   Procedure: INTRAMEDULLARY (IM) NAIL INTERTROCHANTRIC;  Surgeon: Thornton Park, MD;  Location: [facility] ORS;  Service: Orthopedics;  Laterality: Right;  . KYPHOPLASTY N/A 08/19/2014   Procedure: KYPHOPLASTY;  Surgeon: Hessie Knows, MD;  Location: [facility] ORS;  Service: Orthopedics;  Laterality: N/A;  . PARTIAL HYSTERECTOMY    . RIGHT/LEFT HEART CATH AND CORONARY ANGIOGRAPHY N/A 10/05/2016   Procedure: RIGHT/LEFT HEART CATH AND CORONARY ANGIOGRAPHY;  Surgeon: Burnell Blanks, MD;  Location: [place] [facility];  Service: Cardiovascular;  Laterality: N/A;  . TUBAL LIGATION      FAMILY  HISTORY Family History  Problem Relation Age of Onset  . Heart block Mother   . Heart failure Father   . Heart disease Sister   . Breast cancer Maternal Aunt     GYNECOLOGIC HISTORY:  No LMP recorded. Patient has had a hysterectomy.     ADVANCED DIRECTIVES:     HEALTH MAINTENANCE: Social History  Substance Use Topics  . Smoking status: Never Smoker  . Smokeless tobacco: Never Used  . Alcohol use No    Allergies  Allergen Reactions  . Aspirin     GI bleeding   . Penicillins Rash and Other (See Comments)    Has patient had a PCN reaction causing immediate rash, facial/tongue/throat swelling, SOB or lightheadedness with hypotension: Yes Has patient had a PCN reaction causing severe rash involving mucus membranes or skin necrosis: No Has patient had a PCN reaction that required hospitalization: No Has patient had a PCN reaction occurring within the last 10 years: No If all of the above answers are "NO", then may proceed with Cephalosporin use.     Current Outpatient Prescriptions  Medication Sig Dispense Refill  . acetaminophen (TYLENOL) 325 MG tablet Take 325 mg by mouth every 6 (six) hours as needed (for pain.).    Marland Kitchen acidophilus (RISAQUAD) CAPS capsule Take 1 capsule by mouth daily.    Marland Kitchen bismuth-metronidazole-tetracycline (PYLERA) 140-125-125 MG capsule Take 3 capsules by mouth 4 (four) times daily -  before meals and at bedtime. 120 capsule 0  . cholecalciferol (VITAMIN D) 400 units TABS tablet Take 800 Units by mouth daily.     . meclizine (ANTIVERT) 25 MG tablet Take 1 tablet (25 mg total) by mouth 3 (three) times daily as needed for dizziness. (Patient not taking: Reported on 10/23/2016) 15 tablet 0  . pantoprazole (PROTONIX) 40 MG tablet Take 1 tablet (40 mg total) by mouth daily. (Patient not taking: Reported on 10/23/2016) 30 tablet 0  . vitamin B-12 (CYANOCOBALAMIN) 1000 MCG tablet Take 1,000 mcg by mouth daily.     No current facility-administered medications for this visit.     OBJECTIVE: There were no vitals taken for this visit.   There is no height or weight on file to calculate BMI.    ECOG FS:1 - Symptomatic but completely ambulatory  General: Well-developed, well-nourished, no acute distress. Eyes: Pink conjunctiva,  anicteric sclera. HEENT: Normocephalic, moist mucous membranes, clear oropharnyx. Lungs: Clear to auscultation bilaterally. Heart: Regular rate and rhythm. Positive for murmur Musculoskeletal: Non-pitting edema in bilateral ankles. No cyanosis or clubbing. Neuro: Alert, answering all questions appropriately.  Skin: No rashes or petechiae noted. Psych: Normal affect.   LAB RESULTS:  No visits with results within 3 Day(s) from this visit.  Latest known visit with results is:  Admission on 10/26/2016, Discharged on 10/27/2016  Component Date Value Ref Range Status  . Sodium 10/26/2016 142  135 - 145 mmol/L Final  . Potassium 10/26/2016 4.0  3.5 - 5.1 mmol/L Final  . Chloride 10/26/2016 111  101 - 111 mmol/L Final  . CO2 10/26/2016 24  22 - 32 mmol/L Final  . Glucose, Bld 10/26/2016 81  65 - 99 mg/dL Final  . BUN 10/26/2016 44* 6 - 20 mg/dL Final  . Creatinine, Ser 10/26/2016 0.87  0.44 - 1.00 mg/dL Final  . Calcium 10/26/2016 8.4* 8.9 - 10.3 mg/dL Final  . GFR calc non Af Amer 10/26/2016 59* >60 mL/min Final  . GFR calc Af Amer 10/26/2016 >60  >60 mL/min Final  Comment: (NOTE) The eGFR has been calculated using the CKD EPI equation. This calculation has not been validated in all clinical situations. eGFR's persistently <60 mL/min signify possible Chronic Kidney Disease.   . Anion gap 10/26/2016 7  5 - 15 Final  . WBC 10/26/2016 5.2  3.6 - 11.0 K/uL Final  . RBC 10/26/2016 2.26* 3.80 - 5.20 MIL/uL Final  . Hemoglobin 10/26/2016 7.0* 12.0 - 16.0 g/dL Final  . HCT 10/26/2016 21.3* 35.0 - 47.0 % Final  . MCV 10/26/2016 94.4  80.0 - 100.0 fL Final  . MCH 10/26/2016 31.2  26.0 - 34.0 pg Final  . MCHC 10/26/2016 33.0  32.0 - 36.0 g/dL Final  . RDW 10/26/2016 19.1* 11.5 - 14.5 % Final  . Platelets 10/26/2016 178  150 - 440 K/uL Final  . Neutrophils Relative % 10/26/2016 71  % Final  . Neutro Abs 10/26/2016 3.7  1.4 - 6.5 K/uL Final  . Lymphocytes Relative 10/26/2016 20  % Final  .  Lymphs Abs 10/26/2016 1.1  1.0 - 3.6 K/uL Final  . Monocytes Relative 10/26/2016 7  % Final  . Monocytes Absolute 10/26/2016 0.4  0.2 - 0.9 K/uL Final  . Eosinophils Relative 10/26/2016 1  % Final  . Eosinophils Absolute 10/26/2016 0.1  0 - 0.7 K/uL Final  . Basophils Relative 10/26/2016 1  % Final  . Basophils Absolute 10/26/2016 0.1  0 - 0.1 K/uL Final  . Order Confirmation 10/26/2016 ORDER PROCESSED BY BLOOD BANK   Final  . ABO/RH(D) 10/26/2016 A POS   Final  . Antibody Screen 10/26/2016 NEG   Final  . Sample Expiration 10/26/2016 10/29/2016   Final  . Unit Number 10/26/2016 [id number]   Final  . Blood Component Type 10/26/2016 RBC, LR IRR   Final  . Unit division 10/26/2016 00   Final  . Status of Unit 10/26/2016 ISSUED,FINAL   Final  . Transfusion Status 10/26/2016 OK TO TRANSFUSE   Final  . Crossmatch Result 10/26/2016 Compatible   Final  . Unit Number 10/26/2016 [id number]   Final  . Blood Component Type 10/26/2016 RBC, LR IRR   Final  . Unit division 10/26/2016 00   Final  . Status of Unit 10/26/2016 ISSUED,FINAL   Final  . Transfusion Status 10/26/2016 OK TO TRANSFUSE   Final  . Crossmatch Result 10/26/2016 Compatible   Final  . ISSUE DATE / TIME 10/26/2016 [id number]   Final  . Blood Product Unit Number 10/26/2016 [id number]   Final  . PRODUCT CODE 10/26/2016 [id number]   Final  . Unit Type and Rh 10/26/2016 0600   Final  . Blood Product Expiration Date 10/26/2016 [id number]   Final  . ISSUE DATE / TIME 10/26/2016 [id number]   Final  . Blood Product Unit Number 10/26/2016 [id number]   Final  . PRODUCT CODE 10/26/2016 [id number]   Final  . Unit Type and Rh 10/26/2016 0600   Final  . Blood Product Expiration Date 10/26/2016 [id number]   Final  . Vitamin B-12 10/27/2016 1091* 180 - 914 pg/mL Final   Comment: (NOTE) This assay is not validated for testing neonatal or myeloproliferative syndrome specimens for Vitamin B12 levels. Performed at [facility], [place] [address] [place], [place] [address]   . Folate 10/27/2016 12.5  >5.9 ng/mL Final  . Iron 10/27/2016 180* 28 - 170 ug/dL Final  . TIBC 10/27/2016 259  250 - 450 ug/dL Final  . Saturation Ratios 10/27/2016 70* 10.4 - 31.8 % Final  .  UIBC 10/27/2016 79  ug/dL Final  . Ferritin 10/27/2016 32  11 - 307 ng/mL Final  . Retic Ct Pct 10/27/2016 2.8  0.4 - 3.1 % Final  . RBC. 10/27/2016 3.15* 3.80 - 5.20 MIL/uL Final  . Retic Count, Absolute 10/27/2016 88.2  19.0 - 183.0 K/uL Final  . WBC 10/27/2016 7.0  3.6 - 11.0 K/uL Final  . RBC 10/27/2016 3.13* 3.80 - 5.20 MIL/uL Final  . Hemoglobin 10/27/2016 9.9* 12.0 - 16.0 g/dL Final  . HCT 10/27/2016 28.4* 35.0 - 47.0 % Final  . MCV 10/27/2016 90.7  80.0 - 100.0 fL Final  . MCH 10/27/2016 31.7  26.0 - 34.0 pg Final  . MCHC 10/27/2016 35.0  32.0 - 36.0 g/dL Final  . RDW 10/27/2016 17.2* 11.5 - 14.5 % Final  . Platelets 10/27/2016 153  150 - 440 K/uL Final  . Sodium 10/27/2016 143  135 - 145 mmol/L Final  . Potassium 10/27/2016 3.7  3.5 - 5.1 mmol/L Final  . Chloride 10/27/2016 112* 101 - 111 mmol/L Final  . CO2 10/27/2016 26  22 - 32 mmol/L Final  . Glucose, Bld 10/27/2016 96  65 - 99 mg/dL Final  . BUN 10/27/2016 41* 6 - 20 mg/dL Final  . Creatinine, Ser 10/27/2016 0.87  0.44 - 1.00 mg/dL Final  . Calcium 10/27/2016 8.2* 8.9 - 10.3 mg/dL Final  . GFR calc non Af Amer 10/27/2016 59* >60 mL/min Final  . GFR calc Af Amer 10/27/2016 >60  >60 mL/min Final   Comment: (NOTE) The eGFR has been calculated using the CKD EPI equation. This calculation has not been validated in all clinical situations. eGFR's persistently <60 mL/min signify possible Chronic Kidney Disease.   . Anion gap 10/27/2016 5  5 - 15 Final   Lab Results  Component Value Date   IRON 180 (H) 10/27/2016   TIBC 259 10/27/2016   IRONPCTSAT 70 (H) 10/27/2016   Lab Results  Component Value Date   FERRITIN 32 10/27/2016    STUDIES: No results  found.  ASSESSMENT: Iron deficiency anemia.  PLAN:    1. Iron deficiency anemia: Patient has a history of intermittent dark stools, but reports a negative EGD, colonoscopy, capsule endoscopy completed in [place] in 2015. Patient had a repeat capsule endoscopy more recently that was also reported as negative. Patient's hemoglobin is decreased, but remained stable and unchanged. Her iron stores continue to be within normal limits. No intervention is needed. Return to clinic in 4 month with repeat laboratory work and further evaluation.  2. Right leg pain: Resolved. Continue physical therapy and rehabilitation per orthopedics. 3. GI bleed: No obvious source identified.  Approximately 20 minutes was spent in discussion of which greater than 50% was consultation.  Patient expressed understanding and was in agreement with this plan. She also understands that She can call clinic at any time with any questions, concerns, or complaints.    Lloyd Huger, MD 10/29/16 11:32 AM

## 2016-10-31 ENCOUNTER — Telehealth: Payer: Self-pay | Admitting: Gastroenterology

## 2016-10-31 ENCOUNTER — Ambulatory Visit (HOSPITAL_COMMUNITY): Payer: Medicare Other

## 2016-10-31 ENCOUNTER — Inpatient Hospital Stay: Payer: Medicare Other | Admitting: Oncology

## 2016-10-31 ENCOUNTER — Telehealth: Payer: Self-pay | Admitting: *Deleted

## 2016-10-31 ENCOUNTER — Inpatient Hospital Stay: Payer: Medicare Other

## 2016-10-31 ENCOUNTER — Other Ambulatory Visit (HOSPITAL_COMMUNITY): Payer: Medicare Other

## 2016-10-31 NOTE — Telephone Encounter (Signed)
Per Patients daughter,Kelsey Le, Kelsey Le refuses to take the antibiotic for C diff stating she took it in the past and is allergic to it. Please call Kelsey MessierKathy and let her know what to do. [phone]

## 2016-10-31 NOTE — Telephone Encounter (Signed)
Patient was told she has CDiff and has an appointment today, asking if she should cancel it. Per VO Dr Orlie DakinFinnegan cancel appointment today. Kelsey MessierKathy informed, appointment cancelled

## 2016-11-02 NOTE — Telephone Encounter (Signed)
Spoke with pt's daughter, [name]. Discussed with her that pt has H pylori infection not c-diff. Advised her to go to her mother's house to make sure she is taking the [place]yl[place] as directed. Confirmed pt is not allergic to any antibiotics in Pyl[place].

## 2016-11-05 ENCOUNTER — Encounter: Payer: Medicare Other | Admitting: Thoracic Surgery (Cardiothoracic Vascular Surgery)

## 2016-11-07 NOTE — Progress Notes (Signed)
[facility]  [phone]  Fax:(336) [phone]     FIA HEBERT DOB: [birth year]  MR#: [id number]  AOZ#:[id number]  Patient Care Team: Marguarite Arbour, MD as PCP - General (Internal Medicine) Jeralyn Ruths, MD as Consulting Physician (Oncology)  Chief complaint: Iron deficiency anemia secondary to GI blood loss.  INTERVAL HISTORY: Patient returns to clinic today for further evaluation and laboratory work.  She missed her appointment last week secondary to a bout of C. difficile.  She continues to have weakness and fatigue, but denies any further black tarry stools or diarrhea. She has no neurologic complaints. She denies any recent fevers or illnesses. She has no chest pain, shortness of breath, or cough.  She denies nausea, vomiting, constipation, or diarrhea. She has no urinary complaints. Patient offers no specific complaints today.   REVIEW OF SYSTEMS:   Review of Systems  Constitutional: Positive for malaise/fatigue. Negative for chills and fever.  Respiratory: Negative.  Negative for cough, hemoptysis and shortness of breath.   Cardiovascular: Negative.  Negative for chest pain, palpitations and leg swelling.  Gastrointestinal: Negative.  Negative for abdominal pain, blood in stool, constipation, diarrhea, melena, nausea and vomiting.  Genitourinary: Negative.  Negative for hematuria.  Musculoskeletal: Positive for joint pain.  Skin: Negative.  Negative for rash.  Neurological: Negative for dizziness and headaches.  Endo/Heme/Allergies: Negative.   Psychiatric/Behavioral: Negative.  The patient is not nervous/anxious.     As per HPI. Otherwise, a complete review of systems is negative.  ONCOLOGY HISTORY:  No history exists.    PAST MEDICAL HISTORY: Past Medical History:  Diagnosis Date  . Aortic stenosis    a. TTE 8/17: nl EF, mod to sev AS w/ mean gradient 28 mmHg, valve area 0.97, mod pulm HTN, small pericardial effusion  . Arrhythmia    . Asthma   . AVM (arteriovenous malformation)    a. small intestine   . Broken back   . Broken wrist   . Hip fracture ([facility])    a. in the setting of mechanical fall  . Iron deficiency anemia    a. requiring periodic transfusions  . Syncope and collapse     PAST SURGICAL HISTORY: Past Surgical History:  Procedure Laterality Date  . APPENDECTOMY    . BACK SURGERY     back fusion  . FRACTURE SURGERY    . HIP FRACTURE SURGERY    . PARTIAL HYSTERECTOMY    . TUBAL LIGATION      FAMILY HISTORY Family History  Problem Relation Age of Onset  . Heart block Mother   . Heart failure Father   . Heart disease Sister   . Breast cancer Maternal Aunt     GYNECOLOGIC HISTORY:  No LMP recorded. Patient has had a hysterectomy.     ADVANCED DIRECTIVES:    HEALTH MAINTENANCE: Social History   Tobacco Use  . Smoking status: Never Smoker  . Smokeless tobacco: Never Used  Substance Use Topics  . Alcohol use: No  . Drug use: No    Allergies  Allergen Reactions  . Aspirin     GI bleeding   . Penicillins Rash and Other (See Comments)    Has patient had a PCN reaction causing immediate rash, facial/tongue/throat swelling, SOB or lightheadedness with hypotension: Yes Has patient had a PCN reaction causing severe rash involving mucus membranes or skin necrosis: No Has patient had a PCN reaction that required hospitalization: No Has patient had a PCN reaction occurring within  the last 10 years: No If all of the above answers are "NO", then may proceed with Cephalosporin use.     Current Outpatient Medications  Medication Sig Dispense Refill  . bismuth-metronidazole-tetracycline (PYLERA) 140-125-125 MG capsule Take 3 capsules by mouth 4 (four) times daily -  before meals and at bedtime. 120 capsule 0  . acetaminophen (TYLENOL) 325 MG tablet Take 325 mg by mouth every 6 (six) hours as needed (for pain.).    Marland Kitchen. acidophilus (RISAQUAD) CAPS capsule Take 1 capsule by mouth daily.    .  cholecalciferol (VITAMIN D) 400 units TABS tablet Take 800 Units by mouth daily.     . meclizine (ANTIVERT) 25 MG tablet Take 1 tablet (25 mg total) by mouth 3 (three) times daily as needed for dizziness. (Patient not taking: Reported on 10/23/2016) 15 tablet 0  . pantoprazole (PROTONIX) 40 MG tablet Take 1 tablet (40 mg total) by mouth daily. (Patient not taking: Reported on 10/23/2016) 30 tablet 0  . vitamin B-12 (CYANOCOBALAMIN) 1000 MCG tablet Take 1,000 mcg by mouth daily.     No current facility-administered medications for this visit.     OBJECTIVE: BP (!) 185/73   Pulse 60   Temp (!) 96.8 F (36 C) (Tympanic)   Resp 20   Wt 114 lb 12.8 oz (52.1 kg)   BMI 19.10 kg/m    Body mass index is 19.1 kg/m.    ECOG FS:1 - Symptomatic but completely ambulatory  General: Well-developed, well-nourished, no acute distress. Eyes: Pink conjunctiva, anicteric sclera. HEENT: Normocephalic, moist mucous membranes, clear oropharnyx. Lungs: Clear to auscultation bilaterally. Heart: Regular rate and rhythm. Positive for murmur Musculoskeletal: Non-pitting edema in bilateral ankles. No cyanosis or clubbing. Neuro: Alert, answering all questions appropriately.  Skin: No rashes or petechiae noted. Psych: Normal affect.   LAB RESULTS:  Appointment on 11/08/2016  Component Date Value Ref Range Status  . WBC 11/08/2016 4.1  3.6 - 11.0 K/uL Final  . RBC 11/08/2016 3.43* 3.80 - 5.20 MIL/uL Final  . Hemoglobin 11/08/2016 10.8* 12.0 - 16.0 g/dL Final  . HCT 16/10/960411/08/2016 32.6* 35.0 - 47.0 % Final  . MCV 11/08/2016 95.0  80.0 - 100.0 fL Final  . MCH 11/08/2016 31.4  26.0 - 34.0 pg Final  . MCHC 11/08/2016 33.0  32.0 - 36.0 g/dL Final  . RDW 54/09/811911/08/2016 16.6* 11.5 - 14.5 % Final  . Platelets 11/08/2016 174  150 - 440 K/uL Final  . Neutrophils Relative % 11/08/2016 65  % Final  . Neutro Abs 11/08/2016 2.7  1.4 - 6.5 K/uL Final  . Lymphocytes Relative 11/08/2016 25  % Final  . Lymphs Abs 11/08/2016 1.0   1.0 - 3.6 K/uL Final  . Monocytes Relative 11/08/2016 7  % Final  . Monocytes Absolute 11/08/2016 0.3  0.2 - 0.9 K/uL Final  . Eosinophils Relative 11/08/2016 1  % Final  . Eosinophils Absolute 11/08/2016 0.0  0 - 0.7 K/uL Final  . Basophils Relative 11/08/2016 2  % Final  . Basophils Absolute 11/08/2016 0.1  0 - 0.1 K/uL Final  . Blood Bank Specimen 11/08/2016 SAMPLE AVAILABLE FOR TESTING   Final  . Sample Expiration 11/08/2016 11/11/2016   Final   Lab Results  Component Value Date   IRON 180 (H) 10/27/2016   TIBC 259 10/27/2016   IRONPCTSAT 70 (H) 10/27/2016   Lab Results  Component Value Date   FERRITIN 32 10/27/2016    STUDIES: No results found.  ASSESSMENT: Iron deficiency anemia.  PLAN:    1. Iron deficiency anemia: Patient has a recent history of dark tarry stools and required several units of packed red blood cells.  Her hemoglobin is significantly improved to 10.8 today.  Her iron stores are within normal limits.  She does not require a blood transfusion or IV iron today.  No intervention is needed.  Patient states she has multiple physicians appointments over the next several weeks and wishes to delay further follow-up until January 2019.   2. GI bleed: No obvious source identified.  Continue close follow-up with GI as indicated.  Approximately 30 minutes was spent in discussion of which greater than 50% was consultation.  Patient expressed understanding and was in agreement with this plan. She also understands that She can call clinic at any time with any questions, concerns, or complaints.    Jeralyn Ruthsimothy J Finnegan, MD 11/09/16 8:52 AM

## 2016-11-08 ENCOUNTER — Inpatient Hospital Stay: Payer: Medicare Other | Attending: Oncology

## 2016-11-08 ENCOUNTER — Inpatient Hospital Stay (HOSPITAL_BASED_OUTPATIENT_CLINIC_OR_DEPARTMENT_OTHER): Payer: Medicare Other | Admitting: Oncology

## 2016-11-08 ENCOUNTER — Encounter: Payer: Self-pay | Admitting: Oncology

## 2016-11-08 ENCOUNTER — Other Ambulatory Visit: Payer: Self-pay

## 2016-11-08 VITALS — BP 185/73 | HR 60 | Temp 96.8°F | Resp 20 | Wt 114.8 lb

## 2016-11-08 DIAGNOSIS — D500 Iron deficiency anemia secondary to blood loss (chronic): Secondary | ICD-10-CM | POA: Insufficient documentation

## 2016-11-08 DIAGNOSIS — R531 Weakness: Secondary | ICD-10-CM

## 2016-11-08 DIAGNOSIS — Z803 Family history of malignant neoplasm of breast: Secondary | ICD-10-CM | POA: Insufficient documentation

## 2016-11-08 DIAGNOSIS — I350 Nonrheumatic aortic (valve) stenosis: Secondary | ICD-10-CM | POA: Insufficient documentation

## 2016-11-08 DIAGNOSIS — R5383 Other fatigue: Secondary | ICD-10-CM | POA: Insufficient documentation

## 2016-11-08 DIAGNOSIS — Z8781 Personal history of (healed) traumatic fracture: Secondary | ICD-10-CM

## 2016-11-08 DIAGNOSIS — K922 Gastrointestinal hemorrhage, unspecified: Secondary | ICD-10-CM

## 2016-11-08 DIAGNOSIS — A0472 Enterocolitis due to Clostridium difficile, not specified as recurrent: Secondary | ICD-10-CM | POA: Diagnosis not present

## 2016-11-08 DIAGNOSIS — R55 Syncope and collapse: Secondary | ICD-10-CM | POA: Diagnosis not present

## 2016-11-08 DIAGNOSIS — I499 Cardiac arrhythmia, unspecified: Secondary | ICD-10-CM | POA: Insufficient documentation

## 2016-11-08 DIAGNOSIS — Z79899 Other long term (current) drug therapy: Secondary | ICD-10-CM | POA: Diagnosis not present

## 2016-11-08 DIAGNOSIS — Q2730 Arteriovenous malformation, site unspecified: Secondary | ICD-10-CM

## 2016-11-08 DIAGNOSIS — J45909 Unspecified asthma, uncomplicated: Secondary | ICD-10-CM | POA: Diagnosis not present

## 2016-11-08 LAB — SAMPLE TO BLOOD BANK

## 2016-11-08 LAB — CBC WITH DIFFERENTIAL/PLATELET
Basophils Absolute: 0.1 10*3/uL (ref 0–0.1)
Basophils Relative: 2 %
EOS ABS: 0 10*3/uL (ref 0–0.7)
Eosinophils Relative: 1 %
HCT: 32.6 % — ABNORMAL LOW (ref 35.0–47.0)
HEMOGLOBIN: 10.8 g/dL — AB (ref 12.0–16.0)
LYMPHS ABS: 1 10*3/uL (ref 1.0–3.6)
LYMPHS PCT: 25 %
MCH: 31.4 pg (ref 26.0–34.0)
MCHC: 33 g/dL (ref 32.0–36.0)
MCV: 95 fL (ref 80.0–100.0)
Monocytes Absolute: 0.3 10*3/uL (ref 0.2–0.9)
Monocytes Relative: 7 %
NEUTROS PCT: 65 %
Neutro Abs: 2.7 10*3/uL (ref 1.4–6.5)
PLATELETS: 174 10*3/uL (ref 150–440)
RBC: 3.43 MIL/uL — AB (ref 3.80–5.20)
RDW: 16.6 % — ABNORMAL HIGH (ref 11.5–14.5)
WBC: 4.1 10*3/uL (ref 3.6–11.0)

## 2016-11-08 NOTE — Progress Notes (Signed)
Patient here today for follow up regarding anemia. Patient reports she is currently being treated for H. Pylori, still having dark stools.

## 2016-11-12 ENCOUNTER — Encounter: Payer: Self-pay | Admitting: Cardiovascular Disease

## 2016-11-12 ENCOUNTER — Telehealth: Payer: Self-pay | Admitting: Gastroenterology

## 2016-11-12 NOTE — Telephone Encounter (Signed)
Patient needs an order to have her stool rechecked at the lab.

## 2016-11-13 ENCOUNTER — Other Ambulatory Visit: Payer: Self-pay

## 2016-11-13 DIAGNOSIS — A048 Other specified bacterial intestinal infections: Secondary | ICD-10-CM

## 2016-11-13 NOTE — Telephone Encounter (Signed)
Spoke with pt's daughter, [name] and informed her her mother will need to repeat H pylori stool test during the week of Dec 17th. Orders placed.

## 2016-11-23 NOTE — Progress Notes (Signed)
Multi-Disciplinary Valve Clinic Note  No chief complaint on file.   History of Present Illness: 83 yo female with history of iron deficiency anemia, prior GI bleeding due to AVMs, asthma and severe aortic valve stenosis who is here today for follow up in the valve clinic. I saw her as a new consult 10/01/16 at the request of Dr. Arida for discussion regarding her aortic valve stenosis and possible TAVR. She has prior GI bleeding due to AVMs but over the 6 months prior to her first visit with me, this had been stable. She has been followed for years for aortic stenosis. Echo 08/28/16 with normal LV systolic function, LVEF=60-65%. The aortic valve stenosis has progressed. Mean gradient 65 mmHg, peak gradient 106 mmHg, AVA 0.75 cm2. No mitral valve disease. She was seen in the ED at [facility] on 09/28/16 with c/o headache and dizziness. CTA head in th EDoristine CoBethesda HosFreddrick Doristine CoSt [facility] Hospital MilfoFreddrDoristine CoTemple University-EpiscopFreddricDoristine CoDreyer Medical Ambulatory SurgFreddrick GrDaDoristine CoMethodist Ambulatory Surgery Center Of Freddrick DaDoristine CoPhysicians Of MoFreddrick Darcel [id number]-DorrHarSecretBaptist Surgery And EndoscoDoristine CoWest Coast Joint And SpFreddricDoristine CoVibra Hospital Of Western Mass CentFrDoristine CoEndoscopy CenterFreddrick WDoristine CoTristar Southern Hills MediFreddrick Darcel BaDoristine CoAtlantic RehabilitationFreddricDarDoristine CoDesert Willow TreatmFreddrick CiDoristine CoMclaren GreatFreddrickDoristine CoRegional Hospital OFreddricDoristine CoLiberty Ambulatory Surgery Freddrick Doristine CoGreenwood CountFredDoristine CoSaginaw Valley EndoDoristine CoArizona StatFreddrick SalDarDoristine CoBox Butte GeneraFreddrick Doristine CoKindred HospiFreddrick JefDarcel BaD93Doristine CoLouisiana Extended Care Hospital OfFreddrick DaDaDoristine CoCornerstone HospiFreddrick SpringDoristine CoTryon EndoscFreddrick Oakleaf PlanDarcel [id number] Care Hospita([id number])DorrHDoristine CoRegency HospitaFreddrickDoristine CoFresno Va Medical Center (Va Central California HealthcaFreddrick WooDarceDoristine CoOcean MediFreDoristine CoOdessa Regional MediFreddricDoristine CoAdventist Healthcare Behavioral Health Freddrick Darcel [id number] Auto rHarSecretEdgemoor Geriatric Hospitaln in the left vallecula 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  She has seen Neurosurgery and no further workup is indicated. She has seen ENT and there is no workup planned for the tongue abnormality.  She described worsened dyspnea with dizziness at her first visit in my office. We discussed the TAVR procedure in detail. Cardiac cath 10/05/16 with no evidence of CAD. There was severe AS with mean gradient 42 mmHg, peak to peak gradient 52 mmHg, AVA 0.69 cm2. I started ASA 81 mg daily post cath to see if she could tolerate this with history of AVMs and bleeding. She was admitted to [facility] 10/11/16, after one week of ASA therapy with weakness and melena, most likely due to AVMs. Hgb dropped from 11.9 to 6.1. ASA was stopped. EGD on 10/12/16 showed gastritis only. GI consult team felt that her bleeding was likely from her AVMs. She was given 3 units of pRBCs and Hgb was 10 prior to d/c home. Her melena resolved off of ASA. She was then readmitted on 10/26/16 at [facility] with  recurrent GI bleeding and Hgb was 7.0. She was transfused and had an upper endoscopy. She was treated for H Pylori and her bleeding was felt to be due to AVMs.   She is here today for follow up. The patient denies any chest pain, palpitations, lower extremity edema, orthopnea, PND, near syncope or syncope. She continues to have dizziness and dyspnea. Since d/c from [facility] on 10/28/16, she has followed up in the Oncology office and Hgb stable from 7.0 up to 10.8.   Primary Care Physician: Sparks, Jeffrey D, MD Primary Cardiologist: Arida   Past Medical History:  Diagnosis Date  . Aortic stenosis    a. TTE 8/17: nl EF, mod to sev AS w/ mean gradient 28 mmHg, valve area 0.97, mod pulm HTN, small pericardial effusion  . Arrhythmia   . Asthma   . AVM (arteriovenous malformation)    a. small intestine   . Broken back   . Broken wrist   . Hip fracture (HCC)    a. in the setting of mechanical fall  . Iron deficiency anemia    a. requiring periodic transfusions  . Syncope and collapse     Past Surgical History:  Procedure Laterality Date  . APPENDECTOMY    . BACK SURGERY     back fusion  . ESOPHAGOGASTRODUODENOSCOPY (EGD) WITH PROPOFOL N/A 11/15/2015   Procedure: ESOPHAGOGASTRODUODENOSCOPY (EGD) WITH PROPOFOL;  Surgeon: Darren Wohl, MD;  Location:  [facility] ENDOSCOPY;  Service: Endoscopy;  Laterality: N/A;  . ESOPHAGOGASTRODUODENOSCOPY (EGD) WITH PROPOFOL N/A 10/12/2016   Procedure: ESOPHAGOGASTRODUODENOSCOPY (EGD) WITH PROPOFOL;  Surgeon: [name], MD;  Location: [facility] ENDOSCOPY;  Service: Endoscopy;  Laterality: N/A;  . FRACTURE SURGERY    . GIVENS CAPSULE STUDY N/A 02/20/2016   Procedure: GIVENS CAPSULE STUDY;  Surgeon: Scot Jun, MD;  Location: [facility] ENDOSCOPY;  Service: Endoscopy;  Laterality: N/A;  . HIP FRACTURE SURGERY    . INTRAMEDULLARY (IM) NAIL INTERTROCHANTERIC Right 11/18/2015   Procedure: INTRAMEDULLARY (IM) NAIL INTERTROCHANTRIC;  Surgeon: Juanell Fairly, MD;   Location: [facility] ORS;  Service: Orthopedics;  Laterality: Right;  . KYPHOPLASTY N/A 08/19/2014   Procedure: KYPHOPLASTY;  Surgeon: Kennedy Bucker, MD;  Location: [facility] ORS;  Service: Orthopedics;  Laterality: N/A;  . PARTIAL HYSTERECTOMY    . RIGHT/LEFT HEART CATH AND CORONARY ANGIOGRAPHY N/A 10/05/2016   Procedure: RIGHT/LEFT HEART CATH AND CORONARY ANGIOGRAPHY;  Surgeon: Kathleene Hazel, MD;  Location: [facility] INVASIVE CV LAB;  Service: Cardiovascular;  Laterality: N/A;  . TUBAL LIGATION      Current Outpatient Medications  Medication Sig Dispense Refill  . acetaminophen (TYLENOL) 325 MG tablet Take 325 mg by mouth every 6 (six) hours as needed (for pain.).    Marland Kitchen acidophilus (RISAQUAD) CAPS capsule Take 1 capsule by mouth daily.    . cholecalciferol (VITAMIN D) 400 units TABS tablet Take 800 Units by mouth daily.     . meclizine (ANTIVERT) 25 MG tablet Take 1 tablet (25 mg total) by mouth 3 (three) times daily as needed for dizziness. 15 tablet 0  . pantoprazole (PROTONIX) 40 MG tablet Take 1 tablet (40 mg total) by mouth daily. 30 tablet 0  . vitamin B-12 (CYANOCOBALAMIN) 1000 MCG tablet Take 1,000 mcg by mouth daily.     No current facility-administered medications for this visit.     Allergies  Allergen Reactions  . Aspirin     GI bleeding   . Penicillins Rash and Other (See Comments)    Has patient had a PCN reaction causing immediate rash, facial/tongue/throat swelling, SOB or lightheadedness with hypotension: Yes Has patient had a PCN reaction causing severe rash involving mucus membranes or skin necrosis: No Has patient had a PCN reaction that required hospitalization: No Has patient had a PCN reaction occurring within the last 10 years: No If all of the above answers are "NO", then may proceed with Cephalosporin use.     Social History   Socioeconomic History  . Marital status: Legally Separated    Spouse name: Not on file  . Number of children: Not on file  . Years of  education: Not on file  . Highest education level: Not on file  Social Needs  . Financial resource strain: Not on file  . Food insecurity - worry: Not on file  . Food insecurity - inability: Not on file  . Transportation needs - medical: Not on file  . Transportation needs - non-medical: Not on file  Occupational History  . Not on file  Tobacco Use  . Smoking status: Never Smoker  . Smokeless tobacco: Never Used  Substance and Sexual Activity  . Alcohol use: No  . Drug use: No  . Sexual activity: Not on file  Other Topics Concern  . Not on file  Social History Narrative  . Not on file    Family History  Problem Relation Age of Onset  . Heart block Mother   . Heart failure Father   .  Heart disease Sister   . Breast cancer Maternal Aunt     Review of Systems:  As stated in the HPI and otherwise negative.   BP (!) 162/64   Pulse 62   Ht 5\' 5"  (1.651 m)   Wt 115 lb (52.2 kg)   SpO2 94%   BMI 19.14 kg/m   Physical Examination:  General: Well developed, well nourished, NAD  HEENT: OP clear, mucus membranes moist  SKIN: warm, dry. No rashes. Neuro: No focal deficits  Musculoskeletal: Muscle strength 5/5 all ext  Psychiatric: Mood and affect normal  Neck: No JVD, no carotid bruits, no thyromegaly, no lymphadenopathy.  Lungs:Clear bilaterally, no wheezes, rhonci, crackles Cardiovascular: Regular rate and rhythm. Loud, harsh, late peaking systolic murmur. No murmurs, gallops or rubs. Abdomen:Soft. Bowel sounds present. Non-tender.  Extremities: No lower extremity edema. Pulses are 2 + in the bilateral DP/PT.  Cardiac cath 10/05/16:  1. No angiographic evidence of CAD 2. Severe aortic valve stenosis (mean gradient 42.4 mmHg, peak to peak gradient 52 mmHg, AVA 0.69 cm2).   Diagnostic Diagram       Implants     No implant documentation for this case.  MERGE Images   Show images for CARDIAC CATHETERIZATION   Link to Procedure Log   Procedure Log    Hemo  Data    Most Recent Value  Fick Cardiac Output 5.54 L/min  Fick Cardiac Output Index 3.57 (L/min)/BSA  Aortic Mean Gradient 42.4 mmHg  Aortic Peak Gradient 52 mmHg  Aortic Valve Area 0.69  Aortic Value Area Index 0.45 cm2/BSA  RA A Wave 2 mmHg  RA V Wave 2 mmHg  RA Mean 1 mmHg  RV Systolic Pressure 23 mmHg  RV Diastolic Pressure 2 mmHg  RV EDP 4 mmHg  PA Systolic Pressure 22 mmHg  PA Diastolic Pressure 10 mmHg  PA Mean 15 mmHg  PW A Wave 13 mmHg  PW V Wave 13 mmHg  PW Mean 8 mmHg  AO Systolic Pressure 125 mmHg  AO Diastolic Pressure 54 mmHg  AO Mean 85 mmHg  LV Systolic Pressure 208 mmHg  LV Diastolic Pressure 5 mmHg  LV EDP 17 mmHg  Arterial Occlusion Pressure Extended Systolic Pressure 166 mmHg  Arterial Occlusion Pressure Extended Diastolic Pressure 75 mmHg  Arterial Occlusion Pressure Extended Mean Pressure 116 mmHg  Left Ventricular Apex Extended Systolic Pressure 218 mmHg  Left Ventricular Apex Extended Diastolic Pressure 7 mmHg  Left Ventricular Apex Extended EDP Pressure 17 mmHg  QP/QS 1  TPVR Index 4.2 HRUI  TSVR Index 23.79 HRUI  PVR SVR Ratio 0.08  TPVR/TSVR Ratio 0.18     Echo 08/28/16: Left ventricle: The cavity size was normal. There was mild   concentric hypertrophy. Systolic function was normal. The   estimated ejection fraction was in the range of 60% to 65%. Wall   motion was normal; there were no regional wall motion   abnormalities. Doppler parameters are consistent with abnormal   left ventricular relaxation (grade 1 diastolic dysfunction). - Aortic valve: There was severe stenosis. Mean gradient (S): 65 mm   Hg. Valve area (VTI): 0.75 cm^2. - Mitral valve: Valve area by pressure half-time: 2.06 cm^2. - Pulmonary arteries: Systolic pressure was mildly increased. PA   peak pressure: 38 mm Hg (S).  Left ventricle:  The cavity size was normal. There was mild concentric hypertrophy. Systolic function was normal. The estimated ejection fraction  was in the range of 60% to 65%. Wall motion was normal; there  were no regional wall motion abnormalities. Doppler parameters are consistent with abnormal left ventricular relaxation (grade 1 diastolic dysfunction).  ------------------------------------------------------------------- Aortic valve:   Trileaflet; mildly thickened, severely calcified leaflets.  Doppler:   There was severe stenosis.   There was no regurgitation.    VTI ratio of LVOT to aortic valve: 0.27. Valve area (VTI): 0.75 cm^2. Indexed valve area (VTI): 0.49 cm^2/m^2. Mean velocity ratio of LVOT to aortic valve: 0.25. Valve area (Vmean): 0.71 cm^2. Indexed valve area (Vmean): 0.46 cm^2/m^2. Mean gradient (S): 65 mm Hg. Peak gradient (S): 106 mm Hg.  ------------------------------------------------------------------- Aorta:  Aortic root: The aortic root was normal in size.  ------------------------------------------------------------------- Mitral valve:   Structurally normal valve.   Mobility was not restricted.  Doppler:  Transvalvular velocity was within the normal range. There was no evidence for stenosis. There was no regurgitation.    Valve area by pressure half-time: 2.06 cm^2. Indexed valve area by pressure half-time: 1.34 cm^2/m^2.    Peak gradient (D): 4 mm Hg.  ------------------------------------------------------------------- Left atrium:  The atrium was normal in size.  ------------------------------------------------------------------- Right ventricle:  The cavity size was normal. Wall thickness was normal. Systolic function was normal.  ------------------------------------------------------------------- Pulmonic valve:    Doppler:  Transvalvular velocity was within the normal range. There was no evidence for stenosis.  ------------------------------------------------------------------- Tricuspid valve:   Structurally normal valve.    Doppler: Transvalvular velocity was within the normal  range. There was mild regurgitation.  ------------------------------------------------------------------- Pulmonary artery:   The main pulmonary artery was normal-sized. Systolic pressure was mildly increased.  ------------------------------------------------------------------- Right atrium:  The atrium was normal in size.  ------------------------------------------------------------------- Pericardium:  There was no pericardial effusion.  ------------------------------------------------------------------- Systemic veins: Inferior vena cava: Poorly visualized. The vessel was normal in size.  ------------------------------------------------------------------- Measurements   Left ventricle                           Value          Reference  LV ID, ED, PLAX chordal          (L)     32    mm       43 - 52  LV ID, ES, PLAX chordal          (L)     19    mm       23 - 38  LV fx shortening, PLAX chordal           41    %        >=29  LV PW thickness, ED                      11    mm       ----------  IVS/LV PW ratio, ED                      1              <=1.3  Stroke volume, 2D                        93    ml       ----------  Stroke volume/bsa, 2D                    61    ml/m^2   ----------  LV e&', lateral  10.3  cm/s     ----------  LV E/e&', lateral                         9.09           ----------  LV e&', medial                            5.87  cm/s     ----------  LV E/e&', medial                          15.95          ----------  LV e&', average                           8.09  cm/s     ----------  LV E/e&', average                         11.58          ----------    Ventricular septum                       Value          Reference  IVS thickness, ED                        11    mm       ----------    LVOT                                     Value          Reference  LVOT ID, S                               19    mm       ----------  LVOT  area                                2.84  cm^2     ----------  LVOT ID                                  19    mm       ----------  LVOT mean velocity, S                    94.1  cm/s     ----------  LVOT VTI, S                              32.9  cm       ----------  Stroke volume (SV), LVOT DP              93.3  ml       ----------  Stroke index (SV/bsa), LVOT DP           60.8  ml/m^2   ----------    Aortic valve  Value          Reference  Aortic valve peak velocity, S            515   cm/s     ----------  Aortic valve mean velocity, S            378   cm/s     ----------  Aortic valve VTI, S                      124   cm       ----------  Aortic mean gradient, S                  65    mm Hg    ----------  Aortic peak gradient, S                  106   mm Hg    ----------  VTI ratio, LVOT/AV                       0.27           ----------  Aortic valve area, VTI                   0.75  cm^2     ----------  Aortic valve area/bsa, VTI               0.49  cm^2/m^2 ----------  Velocity ratio, mean, LVOT/AV            0.25           ----------  Aortic valve area, mean velocity         0.71  cm^2     ----------  Aortic valve area/bsa, mean              0.46  cm^2/m^2 ----------  velocity    Aorta                                    Value          Reference  Aortic root ID, ED                       26    mm       ----------  Ascending aorta ID, A-P, S               28    mm       ----------    Left atrium                              Value          Reference  LA ID, A-P, ES                           28    mm       ----------  LA ID/bsa, A-P                           1.83  cm/m^2   <=2.2  LA volume, ES, 1-p A4C                   31.4  ml       ----------  LA volume/bsa, ES, 1-p A4C               20.5  ml/m^2   ----------  LA volume, ES, 1-p A2C                   47.6  ml       ----------  LA volume/bsa, ES, 1-p A2C               31    ml/m^2   ----------    Mitral  valve                             Value          Reference  Mitral E-wave peak velocity              93.6  cm/s     ----------  Mitral A-wave peak velocity              121   cm/s     ----------  Mitral deceleration time         (H)     366   ms       150 - 230  Mitral pressure half-time                107   ms       ----------  Mitral peak gradient, D                  4     mm Hg    ----------  Mitral E/A ratio, peak                   0.8            ----------  Mitral valve area, PHT, DP               2.06  cm^2     ----------  Mitral valve area/bsa, PHT, DP           1.34  cm^2/m^2 ----------    Pulmonary arteries                       Value          Reference  PA pressure, S, DP               (H)     38    mm Hg    <=30    Tricuspid valve                          Value          Reference  Tricuspid regurg peak velocity           286   cm/s     ----------  Tricuspid peak RV-RA gradient            33    mm Hg    ----------    Right atrium                             Value          Reference  RA ID, S-I, ES, A4C  40.9  mm       34 - 49  RA area, ES, A4C                         9.17  cm^2     8.3 - 19.5  RA volume, ES, A/L                       16.2  ml       ----------  RA volume/bsa, ES, A/L                   10.6  ml/m^2   ----------    Right ventricle                          Value          Reference  TAPSE                                    26.2  mm       ----------  RV s&', lateral, S                        12.7  cm/s     ----------    Pulmonic valve                           Value          Reference  Pulmonic valve peak velocity, S          123   cm/s     ----------  EKG:  EKG is not ordered today. The ekg ordered today demonstrates   Recent Labs: 09/28/2016: ALT 15 10/27/2016: BUN 41; Creatinine, Ser 0.87; Potassium 3.7; Sodium 143 11/08/2016: Hemoglobin 10.8; Platelets 174   Lipid Panel No results found for: CHOL, TRIG, HDL, CHOLHDL, VLDL, LDLCALC,  LDLDIRECT   Wt Readings from Last 3 Encounters:  11/26/16 115 lb (52.2 kg)  11/08/16 114 lb 12.8 oz (52.1 kg)  10/27/16 116 lb 8 oz (52.8 kg)     Other studies Reviewed: Additional studies/ records that were reviewed today include: Echo images Review of the above records demonstrates: severe AS, normal LV systolic function  STS Risk Score:  Risk of Mortality: 2.9%  Morbidity or Mortality: 15.341%  Long Length of Stay: 6.955%  Short Length of Stay: 29.461%  Permanent Stroke: 1.877%  Prolonged Ventilation: 9.19%  DSW Infection: 0.124%  Renal Failure: 2.579%  Reoperation: 8.258%  Assessment and Plan:   1. Severe aortic valve stenosis: She has severe, symptomatic aortic valve stenosis. She has no obstructive CAD. She is felt to be a good candidate for TAVR based on her anatomy but this has been delayed due to two life threatening GI bleeds over the past 6 weeks. 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 She is being treated for H Pylori currently. She continues to have dizziness, likely due to her severe AS. The TAVR team has reviewed her case and if is felt that we should proceed with planning for TAVR if she has stable blood counts, but at this point, she has been unable to maintain a stable Hgb, even off of ASA. If her bleeding were only due to  ASA use, we could implant the stent valve and try no anti-platelet therapy knowing there would be some risk of thrombosis. However, at this time, we cannot consider this as she has been bleeding again, even off of anti-platelet therapy. If her anemia is truly due to gastritis and H Pylori, then her blood counts should remain stable after treatment for H Pylori. I will see her back in 6 weeks to review again. If we do not perform TAVR, she will most certainly have progression of her symptoms due to AS. The patient and family understand the risk of valve thrombosis if she is unable to tolerate ASA or  Plavix following the TAVR.    Current medicines are reviewed at length with the patient today.  The patient does not have concerns regarding medicines.  The following changes have been made:  no change  Labs/ tests ordered today include:   No orders of the defined types were placed in this encounter.  Disposition:   FU with me in 6 weeks.   Signed, Verne Carrow, MD 11/26/2016 9:23 AM    [facility] [address] [place], [place], [place]  [address] Phone: [phone]; Fax: [phone]

## 2016-11-26 ENCOUNTER — Ambulatory Visit (INDEPENDENT_AMBULATORY_CARE_PROVIDER_SITE_OTHER): Payer: Medicare Other | Admitting: Cardiovascular Disease

## 2016-11-26 ENCOUNTER — Encounter: Payer: Self-pay | Admitting: Cardiovascular Disease

## 2016-11-26 VITALS — BP 162/64 | HR 62 | Ht 65.0 in | Wt 115.0 lb

## 2016-11-26 DIAGNOSIS — I350 Nonrheumatic aortic (valve) stenosis: Secondary | ICD-10-CM

## 2016-11-26 NOTE — Patient Instructions (Signed)
Medication Instructions:  Your physician recommends that you continue on your current medications as directed. Please refer to the Current Medication list given to you today.  Labwork: No new orders.   Testing/Procedures: No new orders.   Follow-Up: Your physician recommends that you schedule a follow-up appointment in: 6 WEEKS with Dr Clifton JamesMcAlhany   Any Other Special Instructions Will Be Listed Below (If Applicable).     If you need a refill on your cardiac medications before your next appointment, please call your pharmacy.

## 2016-12-17 ENCOUNTER — Other Ambulatory Visit
Admission: RE | Admit: 2016-12-17 | Discharge: 2016-12-17 | Disposition: A | Discharge status: Home / Self Care | Payer: Medicare Other | Source: Ambulatory Visit | Attending: Gastroenterology | Admitting: Gastroenterology

## 2016-12-17 DIAGNOSIS — A048 Other specified bacterial intestinal infections: Secondary | ICD-10-CM | POA: Diagnosis present

## 2016-12-18 LAB — H. PYLORI ANTIGEN, STOOL: H. Pylori Stool Ag, Eia: NEGATIVE

## 2016-12-19 ENCOUNTER — Telehealth: Payer: Self-pay

## 2016-12-19 NOTE — Telephone Encounter (Signed)
Pt's son notified of lab results. 

## 2016-12-19 NOTE — Telephone Encounter (Signed)
-----   Message from Midge Miniumarren Wohl, MD sent at 12/19/2016 10:45 AM EST ----- Let the patient know her stool H. pylori was negative.

## 2016-12-21 ENCOUNTER — Inpatient Hospital Stay
Admission: EM | Admit: 2016-12-21 | Discharge: 2016-12-23 | DRG: 378 | Disposition: A | Discharge status: Home / Self Care | Payer: Medicare Other | Attending: Internal Medicine | Admitting: Internal Medicine

## 2016-12-21 DIAGNOSIS — K922 Gastrointestinal hemorrhage, unspecified: Secondary | ICD-10-CM | POA: Diagnosis not present

## 2016-12-21 DIAGNOSIS — Z886 Allergy status to analgesic agent status: Secondary | ICD-10-CM

## 2016-12-21 DIAGNOSIS — J45909 Unspecified asthma, uncomplicated: Secondary | ICD-10-CM | POA: Diagnosis present

## 2016-12-21 DIAGNOSIS — I350 Nonrheumatic aortic (valve) stenosis: Secondary | ICD-10-CM | POA: Diagnosis present

## 2016-12-21 DIAGNOSIS — D649 Anemia, unspecified: Secondary | ICD-10-CM

## 2016-12-21 DIAGNOSIS — I272 Pulmonary hypertension, unspecified: Secondary | ICD-10-CM | POA: Diagnosis present

## 2016-12-21 DIAGNOSIS — K767 Hepatorenal syndrome: Secondary | ICD-10-CM | POA: Diagnosis not present

## 2016-12-21 DIAGNOSIS — Z79899 Other long term (current) drug therapy: Secondary | ICD-10-CM

## 2016-12-21 DIAGNOSIS — Z880 Allergy status to penicillin: Secondary | ICD-10-CM

## 2016-12-21 DIAGNOSIS — D62 Acute posthemorrhagic anemia: Secondary | ICD-10-CM | POA: Diagnosis present

## 2016-12-21 DIAGNOSIS — D680 Von Willebrand's disease: Secondary | ICD-10-CM | POA: Diagnosis present

## 2016-12-21 DIAGNOSIS — K5521 Angiodysplasia of colon with hemorrhage: Secondary | ICD-10-CM | POA: Diagnosis present

## 2016-12-21 DIAGNOSIS — R0602 Shortness of breath: Secondary | ICD-10-CM

## 2016-12-21 LAB — COMPREHENSIVE METABOLIC PANEL
ALT: 15 U/L (ref 14–54)
ANION GAP: 7 (ref 5–15)
AST: 27 U/L (ref 15–41)
Albumin: 4.1 g/dL (ref 3.5–5.0)
Alkaline Phosphatase: 57 U/L (ref 38–126)
BUN: 40 mg/dL — ABNORMAL HIGH (ref 6–20)
CHLORIDE: 108 mmol/L (ref 101–111)
CO2: 23 mmol/L (ref 22–32)
CREATININE: 0.75 mg/dL (ref 0.44–1.00)
Calcium: 8.6 mg/dL — ABNORMAL LOW (ref 8.9–10.3)
Glucose, Bld: 91 mg/dL (ref 65–99)
Potassium: 3.8 mmol/L (ref 3.5–5.1)
SODIUM: 138 mmol/L (ref 135–145)
Total Bilirubin: 0.4 mg/dL (ref 0.3–1.2)
Total Protein: 6.2 g/dL — ABNORMAL LOW (ref 6.5–8.1)

## 2016-12-21 LAB — CBC WITH DIFFERENTIAL/PLATELET
Basophils Absolute: 0.1 10*3/uL (ref 0–0.1)
Basophils Relative: 1 %
EOS PCT: 1 %
Eosinophils Absolute: 0 10*3/uL (ref 0–0.7)
HCT: 21.3 % — ABNORMAL LOW (ref 35.0–47.0)
Hemoglobin: 6.9 g/dL — ABNORMAL LOW (ref 12.0–16.0)
LYMPHS ABS: 1 10*3/uL (ref 1.0–3.6)
Lymphocytes Relative: 20 %
MCH: 31.4 pg (ref 26.0–34.0)
MCHC: 32.5 g/dL (ref 32.0–36.0)
MCV: 96.6 fL (ref 80.0–100.0)
MONO ABS: 0.4 10*3/uL (ref 0.2–0.9)
MONOS PCT: 7 %
Neutro Abs: 3.8 10*3/uL (ref 1.4–6.5)
Neutrophils Relative %: 71 %
PLATELETS: 166 10*3/uL (ref 150–440)
RBC: 2.21 MIL/uL — ABNORMAL LOW (ref 3.80–5.20)
RDW: 14.8 % — AB (ref 11.5–14.5)
WBC: 5.3 10*3/uL (ref 3.6–11.0)

## 2016-12-21 LAB — PREPARE RBC (CROSSMATCH)

## 2016-12-21 MED ORDER — ALBUTEROL SULFATE (2.5 MG/3ML) 0.083% IN NEBU: 2.5000 mg | INHALATION_SOLUTION | RESPIRATORY_TRACT | Status: DC | PRN

## 2016-12-21 MED ORDER — PANTOPRAZOLE SODIUM 40 MG IV SOLR: 40.0000 mg | Freq: Two times a day (BID) | INTRAVENOUS | Status: DC

## 2016-12-21 MED ORDER — ONDANSETRON HCL 4 MG PO TABS: 4.0000 mg | ORAL_TABLET | Freq: Four times a day (QID) | ORAL | Status: DC | PRN

## 2016-12-21 MED ORDER — BISACODYL 5 MG PO TBEC: 5.0000 mg | DELAYED_RELEASE_TABLET | Freq: Every day | ORAL | Status: DC | PRN

## 2016-12-21 MED ORDER — RISAQUAD PO CAPS
1.0000 | ORAL_CAPSULE | Freq: Every day | ORAL | Status: DC
  Administered 2016-12-22 – 2016-12-23 (×2): 1 via ORAL
  Filled 2016-12-21 (×2): qty 1

## 2016-12-21 MED ORDER — SODIUM CHLORIDE 0.9 % IV SOLN
8.0000 mg/h | INTRAVENOUS | Status: DC
  Administered 2016-12-21 – 2016-12-22 (×2): 8 mg/h via INTRAVENOUS
  Filled 2016-12-21 (×2): qty 80

## 2016-12-21 MED ORDER — SODIUM CHLORIDE 0.9 % IV SOLN: 10.0000 mL/h | Freq: Once | INTRAVENOUS | Status: DC

## 2016-12-21 MED ORDER — POTASSIUM CHLORIDE IN NACL 20-0.9 MEQ/L-% IV SOLN
INTRAVENOUS | Status: DC
  Administered 2016-12-22: via INTRAVENOUS
  Filled 2016-12-21 (×3): qty 1000

## 2016-12-21 MED ORDER — ACETAMINOPHEN 650 MG RE SUPP: 650.0000 mg | Freq: Four times a day (QID) | RECTAL | Status: DC | PRN

## 2016-12-21 MED ORDER — SENNOSIDES-DOCUSATE SODIUM 8.6-50 MG PO TABS
1.0000 | ORAL_TABLET | Freq: Every evening | ORAL | Status: DC | PRN
Start: 2016-12-21 — End: 2016-12-23

## 2016-12-21 MED ORDER — ONDANSETRON HCL 4 MG/2ML IJ SOLN: 4.0000 mg | Freq: Four times a day (QID) | INTRAMUSCULAR | Status: DC | PRN

## 2016-12-21 MED ORDER — HYDROCODONE-ACETAMINOPHEN 5-325 MG PO TABS: 1.0000 | ORAL_TABLET | ORAL | Status: DC | PRN

## 2016-12-21 MED ORDER — ACETAMINOPHEN 325 MG PO TABS: 650.0000 mg | ORAL_TABLET | Freq: Four times a day (QID) | ORAL | Status: DC | PRN

## 2016-12-21 MED ORDER — SODIUM CHLORIDE 0.9 % IV SOLN
80.0000 mg | Freq: Once | INTRAVENOUS | Status: AC
  Administered 2016-12-21: 18:00:00 80 mg via INTRAVENOUS
  Filled 2016-12-21: qty 80

## 2016-12-21 MED ORDER — MECLIZINE HCL 25 MG PO TABS
25.0000 mg | ORAL_TABLET | Freq: Three times a day (TID) | ORAL | Status: DC | PRN
  Filled 2016-12-21: qty 1

## 2016-12-21 NOTE — ED Provider Notes (Signed)
[facility] Emergency Department Provider Note   ____________________________________________   I have reviewed the triage vital signs and the nursing notes.   HISTORY  Chief Complaint Melena   History limited by: Not Limited   HPI Kelsey Le is a 83 y.o. female who presents to the emergency department today because of concern for anemia and GI bleed.  LOCATION:rectal bleed DURATION:years TIMING: intermittent, started again a couple of days ago  QUALITY: melena CONTEXT: patient with long history of gi bleed of uncertian etiology. The patient has undergone both endoscopy and colonoscopy for this in the past. Has noticed dark black tarry stool for the past few days. PCP ordered blood test which showed anemia below seven. Advised her to present to ER. MODIFYING FACTORS: none ASSOCIATED SYMPTOMS: shortness of breath  Per medical record review patient has a history of aortic stenosis.   Past Medical History:  Diagnosis Date  . Aortic stenosis    a. TTE 8/17: nl EF, mod to sev AS w/ mean gradient 28 mmHg, valve area 0.97, mod pulm HTN, small pericardial effusion  . Arrhythmia   . Asthma   . AVM (arteriovenous malformation)    a. small intestine   . Broken back   . Broken wrist   . Hip fracture (HCC)    a. in the setting of mechanical fall  . Iron deficiency anemia    a. requiring periodic transfusions  . Syncope and collapse     Patient Active Problem List   Diagnosis Date Noted  . Blood in stool   . Gastritis without bleeding   . Anemia 10/11/2016  . Malnutrition of moderate degree 10/11/2016  . UGIB (upper gastrointestinal bleed)   . GIB (gastrointestinal bleeding) 10/10/2016  . Iron deficiency anemia 01/15/2016  . Hip fracture (HCC) 11/17/2015  . Acute posthemorrhagic anemia 11/16/2015  . H/O transfusion of packed red blood cells 11/16/2015  . Elevated troponin 11/16/2015  . Severe aortic stenosis 11/16/2015  . Thrombocytopenia  (HCC) 11/16/2015  . Generalized weakness 11/16/2015  . Melena 11/14/2015  . Lichen sclerosus 10/31/2015  . Constipation 10/24/2015  . DDD (degenerative disc disease), lumbar 11/02/2014  . Mild intermittent asthma without complication 03/05/2014  . GI bleed 03/01/2014  . Lumbar radiculitis 12/16/2013  . Aortic stenosis 07/27/2013  . Osteoarthritis of hip 07/06/2013  . Trochanteric bursitis 07/06/2013  . Heart murmur 04/26/2013  . SOB (shortness of breath) 04/26/2013  . Elevated blood pressure reading without diagnosis of hypertension 04/26/2013  . Left wrist fracture 12/05/2008    Past Surgical History:  Procedure Laterality Date  . APPENDECTOMY    . BACK SURGERY     back fusion  . ESOPHAGOGASTRODUODENOSCOPY (EGD) WITH PROPOFOL N/A 11/15/2015   Procedure: ESOPHAGOGASTRODUODENOSCOPY (EGD) WITH PROPOFOL;  Surgeon: Midge Miniumarren Wohl, MD;  Location: [facility] ENDOSCOPY;  Service: Endoscopy;  Laterality: N/A;  . ESOPHAGOGASTRODUODENOSCOPY (EGD) WITH PROPOFOL N/A 10/12/2016   Procedure: ESOPHAGOGASTRODUODENOSCOPY (EGD) WITH PROPOFOL;  Surgeon: Midge MiniumWohl, Darren, MD;  Location: [facility] ENDOSCOPY;  Service: Endoscopy;  Laterality: N/A;  . FRACTURE SURGERY    . GIVENS CAPSULE STUDY N/A 02/20/2016   Procedure: GIVENS CAPSULE STUDY;  Surgeon: Scot Junobert T Elliott, MD;  [facility];  Service: Endoscopy;  Laterality: N/A;  . HIP FRACTURE SURGERY    . INTRAMEDULLARY (IM) NAIL INTERTROCHANTERIC Right 11/18/2015   Procedure: INTRAMEDULLARY (IM) NAIL INTERTROCHANTRIC;  Surgeon: Juanell FairlyKevin Krasinski, MD;  Location: [facility] ORS;  Service: Orthopedics;  Laterality: Right;  . KYPHOPLASTY N/A 08/19/2014   Procedure: KYPHOPLASTY;  Surgeon: Kennedy BuckerMichael Menz, MD;  Location: [facility] ORS;  Service: Orthopedics;  Laterality: N/A;  . PARTIAL HYSTERECTOMY    . RIGHT/LEFT HEART CATH AND CORONARY ANGIOGRAPHY N/A 10/05/2016   Procedure: RIGHT/LEFT HEART CATH AND CORONARY ANGIOGRAPHY;  Surgeon: Kathleene HazelMcAlhany, Christopher D, MD;  [facility]: [facility]  INVASIVE CV LAB;  Service: Cardiovascular;  Laterality: N/A;  . TUBAL LIGATION      Prior to Admission medications   Medication Sig Start Date End Date Taking? Authorizing Provider  acetaminophen (TYLENOL) 325 MG tablet Take 325 mg by mouth every 6 (six) hours as needed (for pain.).    [provider]  acidophilus (RISAQUAD) CAPS capsule Take 1 capsule by mouth daily.    [provider]  cholecalciferol (VITAMIN D) 400 units TABS tablet Take 800 Units by mouth daily.     [provider]  meclizine (ANTIVERT) 25 MG tablet Take 1 tablet (25 mg total) by mouth 3 (three) times daily as needed for dizziness. 09/29/16   Irean HongSung, Jade J, MD  pantoprazole (PROTONIX) 40 MG tablet Take 1 tablet (40 mg total) by mouth daily. 10/12/16   Shaune Pollackhen, Qing, MD  vitamin B-12 (CYANOCOBALAMIN) 1000 MCG tablet Take 1,000 mcg by mouth daily.    [provider]    Allergies Aspirin and Penicillins  Family History  Problem Relation Age of Onset  . Heart block Mother   . Heart failure Father   . Heart disease Sister   . Breast cancer Maternal Aunt     Social History Social History   Tobacco Use  . Smoking status: Never Smoker  . Smokeless tobacco: Never Used  Substance Use Topics  . Alcohol use: No  . Drug use: No    Review of Systems Constitutional: No fever/chills Eyes: No visual changes. ENT: No sore throat. Cardiovascular: Denies chest pain. Respiratory: Positive for shortness of breath. Gastrointestinal: No abdominal pain.  Positive for GI bleed.    Genitourinary: Negative for dysuria. Musculoskeletal: Negative for back pain. Skin: Negative for rash. Neurological: Negative for headaches, focal weakness or numbness.  ____________________________________________   PHYSICAL EXAM:  VITAL SIGNS: ED Triage Vitals  Enc Vitals Group     BP 12/21/16 1505 (!) 127/41     Pulse --      Resp 12/21/16 1505 18     Temp 12/21/16 1505 98.4 F (36.9 C)     Temp  Source 12/21/16 1505 Oral     SpO2 12/21/16 1505 100 %     Weight 12/21/16 1507 115 lb (52.2 kg)     Height 12/21/16 1507 5\' 3"  (1.6 m)   Constitutional: Alert and oriented. Well appearing and in no distress. Eyes: Conjunctivae are normal.  ENT   Head: Normocephalic and atraumatic.   Nose: No congestion/rhinnorhea.   Mouth/Throat: Mucous membranes are moist.   Neck: No stridor. Hematological/Lymphatic/Immunilogical: No cervical lymphadenopathy. Cardiovascular: Normal rate, regular rhythm.  Systolic murmur.  Respiratory: Normal respiratory effort without tachypnea nor retractions. Breath sounds are clear and equal bilaterally. No wheezes/rales/rhonchi. Gastrointestinal: Soft and non tender. No rebound. No guarding.  Genitourinary: Deferred Musculoskeletal: Normal range of motion in all extremities. No lower extremity edema. Neurologic:  Normal speech and language. No gross focal neurologic deficits are appreciated.  Skin:  Skin is warm, dry and intact. No rash noted. Psychiatric: Mood and affect are normal. Speech and behavior are normal. Patient exhibits appropriate insight and judgment.  ____________________________________________    LABS (pertinent positives/negatives)  CMP cr 0.75 CBC wbc 5.3, hgb 6.9, plt 166  ____________________________________________   EKG  IPhineas Semen, attending physician, personally viewed and interpreted this EKG  EKG Time: 1514 Rate: 71 Rhythm: normal sinus rhythm Axis: normal Intervals: qtc 447 QRS: narrow, q waves V1, V2, V3, LVH ST changes: no st elevation Impression: abnormal ekg  ____________________________________________    RADIOLOGY  None  ____________________________________________   PROCEDURES  Procedures  ____________________________________________   INITIAL IMPRESSION / ASSESSMENT AND PLAN / ED COURSE  Pertinent labs & imaging results that were available during my care of the patient were  reviewed by me and considered in my medical decision making (see chart for details).  Patient presented to the emergency department today because of concern for anemia secondary to gi bleed. Has required blood transfusions in the past. Discussed anemia with patient here, consented for blood transfusion. Will plan on admission to the hospitalist service.    ____________________________________________   FINAL CLINICAL IMPRESSION(S) / ED DIAGNOSES  Final diagnoses:  Gastrointestinal hemorrhage, unspecified gastrointestinal hemorrhage type  Anemia, unspecified type  Shortness of breath     Note: This dictation was prepared with Dragon dictation. Any transcriptional errors that result from this process are unintentional     Phineas Semen, MD 12/21/16 1753

## 2016-12-21 NOTE — ED Triage Notes (Signed)
Pt reports had blood drawn 2 weeks ago and hemoglobin was 10, re-drawn today and was told hemoglobin was 6 and Dr. Judithann SheenSparks told pt to come to ED to get blood transfusion. Pt has been having dark stools since Monday

## 2016-12-21 NOTE — H&P (Signed)
[employer] [facility] [place] [facility]   PATIENT NAME: Kelsey Le    MR#:  [id number]  DATE OF BIRTH:  [birth year]  DATE OF ADMISSION:  12/21/2016  PRIMARY CARE PHYSICIAN: Marguarite ArbourSparks, Jeffrey D, MD   REQUESTING/REFERRING PHYSICIAN: Phineas SemenGoodman, Graydon, MD  CHIEF COMPLAINT:   Chief Complaint  Patient presents with  . Melena   Melena for 4 days. HISTORY OF PRESENT ILLNESS:  Kelsey Le  is a 83 y.o. female with a known history of GI bleeding, anemia, AVM of small intestine, aortic stenosis and asthma.  The patient presented the ED with above chief complaints.  She has had melena for the past 4 days.  She also complains of generalized weakness but denies any chest pain or shortness of breath.  PCP ordered blood test which show decrease in her hemoglobin at 6.7.  She was sent to ED for further evaluation.  PAST MEDICAL HISTORY:   Past Medical History:  Diagnosis Date  . Aortic stenosis    a. TTE 8/17: nl EF, mod to sev AS w/ mean gradient 28 mmHg, valve area 0.97, mod pulm HTN, small pericardial effusion  . Arrhythmia   . Asthma   . AVM (arteriovenous malformation)    a. small intestine   . Broken back   . Broken wrist   . Hip fracture (HCC)    a. in the setting of mechanical fall  . Iron deficiency anemia    a. requiring periodic transfusions  . Syncope and collapse     PAST SURGICAL HISTORY:   Past Surgical History:  Procedure Laterality Date  . APPENDECTOMY    . BACK SURGERY     back fusion  . ESOPHAGOGASTRODUODENOSCOPY (EGD) WITH PROPOFOL N/A 11/15/2015   Procedure: ESOPHAGOGASTRODUODENOSCOPY (EGD) WITH PROPOFOL;  Surgeon: Midge Miniumarren Wohl, MD;  Location: [facility] ENDOSCOPY;  Service: Endoscopy;  Laterality: N/A;  . ESOPHAGOGASTRODUODENOSCOPY (EGD) WITH PROPOFOL N/A 10/12/2016   Procedure: ESOPHAGOGASTRODUODENOSCOPY (EGD) WITH PROPOFOL;  Surgeon: Midge MiniumWohl, Darren, MD;  Location: [facility] ENDOSCOPY;  Service: Endoscopy;  Laterality: N/A;  . FRACTURE SURGERY    . GIVENS  CAPSULE STUDY N/A 02/20/2016   Procedure: GIVENS CAPSULE STUDY;  Surgeon: Scot Junobert T Elliott, MD;  [facility];  Service: Endoscopy;  Laterality: N/A;  . HIP FRACTURE SURGERY    . INTRAMEDULLARY (IM) NAIL INTERTROCHANTERIC Right 11/18/2015   Procedure: INTRAMEDULLARY (IM) NAIL INTERTROCHANTRIC;  Surgeon: Juanell FairlyKevin Krasinski, MD;  Location: [facility] ORS;  Service: Orthopedics;  Laterality: Right;  . KYPHOPLASTY N/A 08/19/2014   Procedure: KYPHOPLASTY;  Surgeon: Kennedy BuckerMichael Menz, MD;  Location: [facility] ORS;  Service: Orthopedics;  Laterality: N/A;  . PARTIAL HYSTERECTOMY    . RIGHT/LEFT HEART CATH AND CORONARY ANGIOGRAPHY N/A 10/05/2016   Procedure: RIGHT/LEFT HEART CATH AND CORONARY ANGIOGRAPHY;  Surgeon: Kathleene HazelMcAlhany, Christopher D, MD;  Location: [facility] INVASIVE CV LAB;  Service: Cardiovascular;  Laterality: N/A;  . TUBAL LIGATION      SOCIAL HISTORY:   Social History   Tobacco Use  . Smoking status: Never Smoker  . Smokeless tobacco: Never Used  Substance Use Topics  . Alcohol use: No    FAMILY HISTORY:   Family History  Problem Relation Age of Onset  . Heart block Mother   . Heart failure Father   . Heart disease Sister   . Breast cancer Maternal Aunt     DRUG ALLERGIES:   Allergies  Allergen Reactions  . Aspirin     GI bleeding   . Penicillins Rash and Other (See Comments)  Has patient had a PCN reaction causing immediate rash, facial/tongue/throat swelling, SOB or lightheadedness with hypotension: Yes Has patient had a PCN reaction causing severe rash involving mucus membranes or skin necrosis: No Has patient had a PCN reaction that required hospitalization: No Has patient had a PCN reaction occurring within the last 10 years: No If all of the above answers are "NO", then may proceed with Cephalosporin use.     REVIEW OF SYSTEMS:   Review of Systems  Constitutional: Positive for malaise/fatigue. Negative for chills and fever.  HENT: Negative for hearing loss and sore  throat.   Eyes: Negative for blurred vision and double vision.  Respiratory: Negative for cough, hemoptysis, shortness of breath, wheezing and stridor.   Cardiovascular: Negative for chest pain, palpitations, orthopnea and leg swelling.  Gastrointestinal: Positive for melena. Negative for abdominal pain, blood in stool, diarrhea, nausea and vomiting.  Genitourinary: Negative for dysuria, flank pain and hematuria.  Musculoskeletal: Negative for back pain and joint pain.  Neurological: Positive for weakness. Negative for dizziness, sensory change, focal weakness, seizures, loss of consciousness and headaches.  Endo/Heme/Allergies: Negative for polydipsia.  Psychiatric/Behavioral: Negative for depression. The patient is not nervous/anxious.     MEDICATIONS AT HOME:   Prior to Admission medications   Medication Sig Start Date End Date Taking? Authorizing Provider  acetaminophen (TYLENOL) 325 MG tablet Take 325 mg by mouth every 6 (six) hours as needed (for pain.).   Yes [provider]  acidophilus (RISAQUAD) CAPS capsule Take 1 capsule by mouth daily.   Yes [provider]  cholecalciferol (VITAMIN D) 400 units TABS tablet Take 800 Units by mouth daily.    Yes [provider]  vitamin B-12 (CYANOCOBALAMIN) 1000 MCG tablet Take 1,000 mcg by mouth daily.   Yes [provider]  meclizine (ANTIVERT) 25 MG tablet Take 1 tablet (25 mg total) by mouth 3 (three) times daily as needed for dizziness. Patient not taking: Reported on 12/21/2016 09/29/16   Irean HongSung, Jade J, MD  pantoprazole (PROTONIX) 40 MG tablet Take 1 tablet (40 mg total) by mouth daily. Patient not taking: Reported on 12/21/2016 10/12/16   Shaune Pollackhen, Rhealyn Cullen, MD      VITAL SIGNS:  Blood pressure (!) 142/51, pulse 90, temperature 98.4 F (36.9 C), temperature source Oral, resp. rate 18, height 5\' 3"  (1.6 m), weight 115 lb (52.2 kg), SpO2 100 %.  PHYSICAL EXAMINATION:  Physical Exam  GENERAL:  83  y.o.-year-old patient lying in the bed with no acute distress.  EYES: Pupils equal, round, reactive to light and accommodation. No scleral icterus. Extraocular muscles intact.  HEENT: Head atraumatic, normocephalic. Oropharynx and nasopharynx clear.  NECK:  Supple, no jugular venous distention. No thyroid enlargement, no tenderness.  LUNGS: Normal breath sounds bilaterally, no wheezing, rales,rhonchi or crepitation. No use of accessory muscles of respiration.  CARDIOVASCULAR: S1, S2 normal. Systolic heart murmurs, no rubs, or gallops.  ABDOMEN: Soft, nontender, nondistended. Bowel sounds present. No organomegaly or mass.  EXTREMITIES: No pedal edema, cyanosis, or clubbing.  NEUROLOGIC: Cranial nerves II through XII are intact. Muscle strength 5/5 in all extremities. Sensation intact. Gait not checked.  PSYCHIATRIC: The patient is alert and oriented x 3.  SKIN: No obvious rash, lesion, or ulcer.   LABORATORY PANEL:   CBC Recent Labs  Lab 12/21/16 1517  WBC 5.3  HGB 6.9*  HCT 21.3*  PLT 166   ------------------------------------------------------------------------------------------------------------------  Chemistries  Recent Labs  Lab 12/21/16 1517  NA 138  K 3.8  CL 108  CO2 23  GLUCOSE 91  BUN 40*  CREATININE 0.75  CALCIUM 8.6*  AST 27  ALT 15  ALKPHOS 57  BILITOT 0.4   ------------------------------------------------------------------------------------------------------------------  Cardiac Enzymes No results for input(s): TROPONINI in the last 168 hours. ------------------------------------------------------------------------------------------------------------------  RADIOLOGY:  No results found.    IMPRESSION AND PLAN:   GI bleeding. The patient will be admitted to medical floor.  N.p.o. with IV fluid support, Protonix drip and GI consult.  Anemia due to acute blood loss.  PRBC transfusion and follow-up hemoglobin.  Aortic stenosis.  Waiting for  surgery per patient.  Asthma.  Stable.   All the records are reviewed and case discussed with ED provider. Management plans discussed with the patient, family and they are in agreement.  CODE STATUS: Full code.  TOTAL TIME TAKING CARE OF THIS PATIENT: 53 minutes.    Shaune Pollack M.D on 12/21/2016 at 6:19 PM  Between 7am to 6pm - Pager - [phone]  After 6pm go to [email or web address] - Social research officer, government  [employer] [facility] [place] [facility]  Office  [phone]  CC: Primary care physician; Marguarite Arbour, MD   Note: This dictation was prepared with Dragon dictation along with smaller phrase technology. Any transcriptional errors that result from this process are unin

## 2016-12-22 ENCOUNTER — Telehealth: Payer: Self-pay | Admitting: Physician Assistant

## 2016-12-22 ENCOUNTER — Other Ambulatory Visit: Payer: Self-pay

## 2016-12-22 DIAGNOSIS — K767 Hepatorenal syndrome: Secondary | ICD-10-CM

## 2016-12-22 DIAGNOSIS — K922 Gastrointestinal hemorrhage, unspecified: Secondary | ICD-10-CM

## 2016-12-22 LAB — BASIC METABOLIC PANEL
ANION GAP: 4 — AB (ref 5–15)
BUN: 28 mg/dL — AB (ref 6–20)
CO2: 22 mmol/L (ref 22–32)
Calcium: 8.3 mg/dL — ABNORMAL LOW (ref 8.9–10.3)
Chloride: 115 mmol/L — ABNORMAL HIGH (ref 101–111)
Creatinine, Ser: 0.62 mg/dL (ref 0.44–1.00)
GFR calc Af Amer: >60 mL/min (ref >60)
Glucose, Bld: 87 mg/dL (ref 65–99)
POTASSIUM: 4 mmol/L (ref 3.5–5.1)
SODIUM: 141 mmol/L (ref 135–145)

## 2016-12-22 LAB — CBC
HEMATOCRIT: 22.7 % — AB (ref 35.0–47.0)
Hemoglobin: 7.7 g/dL — ABNORMAL LOW (ref 12.0–16.0)
MCH: 31.2 pg (ref 26.0–34.0)
MCHC: 33.9 g/dL (ref 32.0–36.0)
MCV: 92.2 fL (ref 80.0–100.0)
Platelets: 131 10*3/uL — ABNORMAL LOW (ref 150–440)
RBC: 2.46 MIL/uL — ABNORMAL LOW (ref 3.80–5.20)
RDW: 16.7 % — AB (ref 11.5–14.5)
WBC: 5.9 10*3/uL (ref 3.6–11.0)

## 2016-12-22 LAB — PREPARE RBC (CROSSMATCH)

## 2016-12-22 MED ORDER — SODIUM CHLORIDE 0.9 % IV SOLN
Freq: Once | INTRAVENOUS | Status: AC
  Administered 2016-12-22: 15:00:00 via INTRAVENOUS

## 2016-12-22 MED ORDER — PANTOPRAZOLE SODIUM 40 MG IV SOLR
40.0000 mg | Freq: Two times a day (BID) | INTRAVENOUS | Status: DC
  Administered 2016-12-22 – 2016-12-23 (×3): 40 mg via INTRAVENOUS
  Filled 2016-12-22 (×3): qty 40

## 2016-12-22 NOTE — Progress Notes (Signed)
[employer] - [place] at [facility]                                                                                                                                                                                  Patient Demographics   Kelsey Le, is a 83 y.o. female, DOB - October 04, 1930, VWU:[id number]  Admit date - 12/21/2016   Admitting Physician Shaune PollackQing Chen, MD  Outpatient Primary MD for the patient is Kelsey Le, Kelsey LopeJeffrey D, MD   LOS - 1  Subjective: With a history of recurrent AVMs presenting with again dark tarry stools Patient very anxious to eat    Review of Systems:   CONSTITUTIONAL: No documented fever. No fatigue, weakness. No weight gain, no weight loss.  EYES: No blurry or double vision.  ENT: No tinnitus. No postnasal drip. No redness of the oropharynx.  RESPIRATORY: No cough, no wheeze, no hemoptysis. No dyspnea.  CARDIOVASCULAR: No chest pain. No orthopnea. No palpitations. No syncope.  GASTROINTESTINAL: No nausea, no vomiting or diarrhea. No abdominal pain.  Positive melena no hematochezia.  GENITOURINARY: No dysuria or hematuria.  ENDOCRINE: No polyuria or nocturia. No heat or cold intolerance.  HEMATOLOGY: No anemia. No bruising. No bleeding.  INTEGUMENTARY: No rashes. No lesions.  MUSCULOSKELETAL: No arthritis. No swelling. No gout.  NEUROLOGIC: No numbness, tingling, or ataxia. No seizure-type activity.  PSYCHIATRIC: No anxiety. No insomnia. No ADD.    Vitals:   Vitals:   12/22/16 0011 12/22/16 0324 12/22/16 1248 12/22/16 1315  BP: (!) 128/45 139/67 (!) 135/51 (!) 124/54  Pulse: 77 (!) 101 81 80  Resp: 18 18 16 16   Temp: 98.5 F (36.9 C) 98 F (36.7 C) 98.8 F (37.1 C) 98.9 F (37.2 C)  TempSrc: Oral Oral Oral Oral  SpO2: 98% 99% 99% 98%  Weight:      Height:        Wt Readings from Last 3 Encounters:  12/21/16 115 lb (52.2 kg)  11/26/16 115 lb (52.2 kg)  11/08/16 114 lb 12.8 oz (52.1 kg)     Intake/Output Summary (Last 24  hours) at 12/22/2016 1512 Last data filed at 12/22/2016 1417 Gross per 24 hour  Intake 2049.92 ml  Output 0 ml  Net 2049.92 ml    Physical Exam:   GENERAL: Pleasant-appearing in no apparent distress.  HEAD, EYES, EARS, NOSE AND THROAT: Atraumatic, normocephalic. Extraocular muscles are intact. Pupils equal and reactive to light. Sclerae anicteric. No conjunctival injection. No oro-pharyngeal erythema.  NECK: Supple. There is no jugular venous distention. No bruits, no lymphadenopathy, no thyromegaly.  HEART: Regular rate and rhythm,.  Systolic murmurs, no rubs, no clicks.  LUNGS: Clear to auscultation  bilaterally. No rales or rhonchi. No wheezes.  ABDOMEN: Soft, flat, nontender, nondistended. Has good bowel sounds. No hepatosplenomegaly appreciated.  EXTREMITIES: No evidence of any cyanosis, clubbing, or peripheral edema.  +2 pedal and radial pulses bilaterally.  NEUROLOGIC: The patient is alert, awake, and oriented x3 with no focal motor or sensory deficits appreciated bilaterally.  SKIN: Moist and warm with no rashes appreciated.  Psych: Not anxious, depressed LN: No inguinal LN enlargement    Antibiotics   Anti-infectives (From admission, onward)   None      Medications   Scheduled Meds: . acidophilus  1 capsule Oral Daily  . pantoprazole (PROTONIX) IV  40 mg Intravenous Q12H   Continuous Infusions: . sodium chloride Stopped (12/21/16 2010)  . sodium chloride     PRN Meds:.acetaminophen **OR** acetaminophen, albuterol, bisacodyl, HYDROcodone-acetaminophen, meclizine, ondansetron **OR** ondansetron (ZOFRAN) IV, senna-docusate   Data Review:   Micro Results No results found for this or any previous visit (from the past 240 hour(s)).  Radiology Reports No results found.   CBC Recent Labs  Lab 12/21/16 1517 12/22/16 0439  WBC 5.3 5.9  HGB 6.9* 7.7*  HCT 21.3* 22.7*  PLT 166 131*  MCV 96.6 92.2  MCH 31.4 31.2  MCHC 32.5 33.9  RDW 14.8* 16.7*  LYMPHSABS  1.0  --   MONOABS 0.4  --   EOSABS 0.0  --   BASOSABS 0.1  --     Chemistries  Recent Labs  Lab 12/21/16 1517 12/22/16 0439  NA 138 141  K 3.8 4.0  CL 108 115*  CO2 23 22  GLUCOSE 91 87  BUN 40* 28*  CREATININE 0.75 0.62  CALCIUM 8.6* 8.3*  AST 27  --   ALT 15  --   ALKPHOS 57  --   BILITOT 0.4  --    ------------------------------------------------------------------------------------------------------------------ estimated creatinine clearance is 43.1 mL/min (by C-G formula based on SCr of 0.62 mg/dL). ------------------------------------------------------------------------------------------------------------------ No results for input(s): HGBA1C in the last 72 hours. ------------------------------------------------------------------------------------------------------------------ No results for input(s): CHOL, HDL, LDLCALC, TRIG, CHOLHDL, LDLDIRECT in the last 72 hours. ------------------------------------------------------------------------------------------------------------------ No results for input(s): TSH, T4TOTAL, T3FREE, THYROIDAB in the last 72 hours.  Invalid input(s): FREET3 ------------------------------------------------------------------------------------------------------------------ No results for input(s): VITAMINB12, FOLATE, FERRITIN, TIBC, IRON, RETICCTPCT in the last 72 hours.  Coagulation profile No results for input(s): INR, PROTIME in the last 168 hours.  No results for input(s): DDIMER in the last 72 hours.  Cardiac Enzymes No results for input(s): CKMB, TROPONINI, MYOGLOBIN in the last 168 hours.  Invalid input(s): CK ------------------------------------------------------------------------------------------------------------------ Invalid input(s): POCBNP    Assessment & Plan  Patient is a 83 year old with history of recurrent GI bleeds presenting with another GI bleed   1.  GI bleed due to recurrent AVM Seen by GI who feels that her  AVMs are related to aortic valve stenosis And feels that she will continue to have this AVM bleeding unless the aortic stenosis is addressed We will start a clear liquid diet  2.  Acute on chronic blood loss anemia I will transfuse patient with 1 more unit of blood  3.  Severe aortic stenosis.  Waiting for surgery per patient.  4.  Asthma.  Stable.   5.  Miscellaneous SCDs for DVT prophylaxis       Code Status Orders  (From admission, onward)        Start     Ordered   12/21/16 1901  Full code  Continuous     12/21/16 1900  Code Status History    Date Active Date Inactive Code Status Order ID Comments User Context   10/26/2016 18:18 10/27/2016 15:28 Full Code [id number]  Adrian SaranMody, Sital, MD Inpatient   10/11/2016 01:06 10/12/2016 19:49 Full Code [id number]  Gery Prayrosley, Debby, MD Inpatient   10/05/2016 14:26 10/05/2016 21:35 Full Code [id number]  Kathleene HazelMcAlhany, Christopher D, MD Inpatient   11/17/2015 22:51 11/18/2015 17:06 Full Code [id number]  Juanell FairlyKrasinski, Kevin, MD Inpatient   11/17/2015 22:51 11/17/2015 22:51 Full Code [id number]  Gracelyn NurseJohnston, John D, MD Inpatient   11/14/2015 19:19 11/16/2015 20:25 Full Code [id number]  Enedina FinnerPatel, Sona, MD Inpatient   08/19/2014 14:52 08/19/2014 18:42 Full Code [id number]  Kennedy BuckerMenz, Michael, MD Inpatient           Consults gi   DVT Prophylaxis scd's  Lab Results  Component Value Date   PLT 131 (L) 12/22/2016     Time Spent in minutes   35min  Greater than 50% of time spent in care coordination and counseling patient regarding the condition and plan of care.   Auburn BilberryPATEL, Vincen Bejar M.Le on 12/22/2016 at 3:12 PM  Between 7am to 6pm - Pager - [phone]  After 6pm go to [email or web address] - password [email or web address] [facility]  [facility]   Office  [phone]

## 2016-12-22 NOTE — Consult Note (Signed)
Wyline MoodKiran Colie Fugitt , MD [address], [address], [place], [place], [id number] [address], [place], [place], [address] Phone: [phone]  Fax: [phone]  Consultation  Referring Provider:    Dr Imogene Burnhen Primary Care Physician:  Marguarite ArbourSparks, Jeffrey D, MD Primary Gastroenterologist:  Dr. Markham JordanElliot         Reason for Consultation:     GI bleed  Date of Admission:  12/21/2016 Date of Consultation:  12/22/2016         HPI:   Kelsey Le is a 83 y.o. female who has a history of iron deficiency anemia attributed to chronic GI blood loss.  She does have severe aortic stenosis.  She has had on and off melena for a long time.  She is followed by hematology in terms of her anemia.  She sees Dr. Mechele CollinElliott as an outpatient.  Last office visit was in February 2018.  She has had multiple upper endoscopies over the years.  Colonoscopy last performed in [facility]20/[facility] for [facility] deficiency anemia was normal.  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  She recently underwent an upper endoscopy on 10/12/2016 by Dr. Charolotte CapuchinWohll and she presented with melena and he noted diffuse mild inflammation found in the stomach examined portion of the duodenum was normal.  She underwent a capsule study in May 2018 with Dr. Mechele CollinElliott which showed AVMs in the small intestine time the proximal portion of small intestine approximately 51 minutes into the procedure.  Multiple was seen for the next 20-30 minutes it is not clear subsequently what the subsequent plan was in terms of management of these AVMs I do not see any further office notes with [facility] to indicate a clear plan of action with regards to management of the AVMs.  In the interim she has been treated for H. pylori by Dr. Charolotte CapuchinWohll.  This admission she was admitted on 12/21/2016 when she presented to the emergency room with melena of 4 days duration and generalized weakness.  Dr. Judithann SheenSparks ordered a CBC  which demonstrated a hemoglobin of 6.7 and 1 7 to the ER for further evaluation.  Review of her notes suggest that she is being planned for cardiac procedures specifically at TAVR procedure to repair her aortic valve.   On admission her hemoglobin was 6.9 g.  It was 10.8 g a month back.  This morning to 7.7 g.  She denies any NSAID use, has had on and off melena for "years " per patient and her son . I spoke to the patients son on the Phone .     Past Medical History:  Diagnosis Date  . Aortic stenosis    a. TTE 8/17: nl EF, mod to sev AS w/ mean gradient 28 mmHg, valve area 0.97, mod pulm HTN, small pericardial effusion  . Arrhythmia   . Asthma   . AVM (arteriovenous malformation)    a. small intestine   . Broken back   . Broken wrist   . Hip fracture (HCC)    a. in the setting of mechanical fall  . Iron deficiency anemia    a. requiring periodic transfusions  . Syncope and collapse     Past Surgical History:  Procedure Laterality Date  . APPENDECTOMY    . BACK SURGERY     back fusion  . ESOPHAGOGASTRODUODENOSCOPY (EGD) WITH PROPOFOL N/A 11/15/2015   Procedure: ESOPHAGOGASTRODUODENOSCOPY (EGD) WITH PROPOFOL;  Surgeon: Ebony Hailarren  Servando Snare, MD;  Location: [facility] ENDOSCOPY;  Service: Endoscopy;  Laterality: N/A;  . ESOPHAGOGASTRODUODENOSCOPY (EGD) WITH PROPOFOL N/A 10/12/2016   Procedure: ESOPHAGOGASTRODUODENOSCOPY (EGD) WITH PROPOFOL;  Surgeon: [name], MD;  Location: [facility] ENDOSCOPY;  Service: Endoscopy;  Laterality: N/A;  . FRACTURE SURGERY    . GIVENS CAPSULE STUDY N/A 02/20/2016   Procedure: GIVENS CAPSULE STUDY;  Surgeon: Scot Jun, MD;  Location: [facility] ENDOSCOPY;  Service: Endoscopy;  Laterality: N/A;  . HIP FRACTURE SURGERY    . INTRAMEDULLARY (IM) NAIL INTERTROCHANTERIC Right 11/18/2015   Procedure: INTRAMEDULLARY (IM) NAIL INTERTROCHANTRIC;  Surgeon: Juanell Fairly, MD;  Location: [facility] ORS;  Service: Orthopedics;  Laterality: Right;  . KYPHOPLASTY N/A 08/19/2014    Procedure: KYPHOPLASTY;  Surgeon: Kennedy Bucker, MD;  Location: [facility] ORS;  Service: Orthopedics;  Laterality: N/A;  . PARTIAL HYSTERECTOMY    . RIGHT/LEFT HEART CATH AND CORONARY ANGIOGRAPHY N/A 10/05/2016   Procedure: RIGHT/LEFT HEART CATH AND CORONARY ANGIOGRAPHY;  Surgeon: Kathleene Hazel, MD;  [facility]: [facility] INVASIVE CV LAB;  Service: Cardiovascular;  Laterality: N/A;  . TUBAL LIGATION      Prior to Admission medications   Medication Sig Start Date End Date Taking? Authorizing Provider  acetaminophen (TYLENOL) 325 MG tablet Take 325 mg by mouth every 6 (six) hours as needed (for pain.).   Yes [provider]  acidophilus (RISAQUAD) CAPS capsule Take 1 capsule by mouth daily.   Yes [provider]  cholecalciferol (VITAMIN D) 400 units TABS tablet Take 800 Units by mouth daily.    Yes [provider]  vitamin B-12 (CYANOCOBALAMIN) 1000 MCG tablet Take 1,000 mcg by mouth daily.   Yes [provider]  meclizine (ANTIVERT) 25 MG tablet Take 1 tablet (25 mg total) by mouth 3 (three) times daily as needed for dizziness. Patient not taking: Reported on 12/21/2016 09/29/16   Irean Hong, MD  pantoprazole (PROTONIX) 40 MG tablet Take 1 tablet (40 mg total) by mouth daily. Patient not taking: Reported on 12/21/2016 10/12/16   Shaune Pollack, MD    Family History  Problem Relation Age of Onset  . Heart block Mother   . Heart failure Father   . Heart disease Sister   . Breast cancer Maternal Aunt      Social History   Tobacco Use  . Smoking status: Never Smoker  . Smokeless tobacco: Never Used  Substance Use Topics  . Alcohol use: No  . Drug use: No    Allergies as of 12/21/2016 - Review Complete 12/21/2016  Allergen Reaction Noted  . Aspirin  10/15/2016  . Penicillins Rash and Other (See Comments) 04/24/2013    Review of Systems:    All systems reviewed and negative except where noted in HPI.   Physical Exam:  Vital signs in last 24  hours: Temp:  [98 F (36.7 C)-98.8 F (37.1 C)] 98 F (36.7 C) (12/22 0324) Pulse Rate:  [70-101] 101 (12/22 0324) Resp:  [18-19] 18 (12/22 0324) BP: (101-152)/(39-79) 139/67 (12/22 0324) SpO2:  [98 %-100 %] 99 % (12/22 0324) Weight:  [115 lb (52.2 kg)] 115 lb (52.2 kg) (12/21 1507) Last BM Date: 12/22/16 General:   Pleasant, cooperative in NAD Head:  Normocephalic and atraumatic. Eyes:   No icterus.   Conjunctiva pink. PERRLA. Ears:  Very hard of hearing Neck:  Supple; no masses or thyroidomegaly Lungs: Respirations even and unlabored. Lungs clear to auscultation bilaterally.   No wheezes, crackles, or rhonchi.  Heart:  Regular rate and rhythm;  systiolic murmur in aortic area.  Abdomen:  Soft, nondistended, nontender. Normal bowel sounds. No appreciable masses or hepatomegaly.  No rebound or guarding.  Neurologic:  Alert and oriented x3;  grossly normal neurologically. Skin:  Intact without significant lesions or rashes. Cervical Nodes:  No significant cervical adenopathy. Psych:  Alert and cooperative. Normal affect.  LAB RESULTS: Recent Labs    12/21/16 1517 12/22/16 0439  WBC 5.3 5.9  HGB 6.9* 7.7*  HCT 21.3* 22.7*  PLT 166 131*   BMET Recent Labs    12/21/16 1517 12/22/16 0439  NA 138 141  K 3.8 4.0  CL 108 115*  CO2 23 22  GLUCOSE 91 87  BUN 40* 28*  CREATININE 0.75 0.62  CALCIUM 8.6* 8.3*   LFT Recent Labs    12/21/16 1517  PROT 6.2*  ALBUMIN 4.1  AST 27  ALT 15  ALKPHOS 57  BILITOT 0.4   PT/INR No results for input(s): LABPROT, INR in the last 72 hours.  STUDIES: No results found.    Impression / Plan:   Kelsey Le is a 83 y.o. y/o female with a history of severe aortic stenosis multiple episodes of anemia requiring transfusion.  She has had a few upper endoscopies over the last few years which have essentially been normal.  She has had a capsule study in February 2018 which has demonstrated AVMs throughout the small bowel which is very  likely source of recurrent bleeds .  Unclear why this was not acted upon subsequently.  She has not been referred  for a small bowel enteroscopy as per my review of epic.  I think the most likely source of her bleed is the small bowel which has not been looked into unfortunately.  She has not had follow[facility]up at [facility] since February 2018 with regards to her anemia and further management.  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  The permanent treatment for this condition would actually be to treat the valve which has been shown that treatment of aortic stenosis leads  to cessation of bleeding from the AVMs.This condition usually occurs from aortic stenosis due to development of an acquired von Willebrand's factor dysfunction which leads to development of bleeds.  It is  likely that correction of the aortic stenosis would lead to reversal of this process and hence probably even anticoagulation would be probably considered at that point of time.  In the meanwhile the other options are to ablate the AVMs with a small bowel enteroscopy which [facility] only be done at [facility] as we do not have the equipment over here.  I am not too sure if her repeat upper endoscopy which was just performed 6-8 weeks back wouldyield anything further as it was essentially normal been performed by Dr. Charolotte CapuchinWohll in October 2018.  It probably is best managed as a multidisciplinary manner at [facility] with interaction between the GI team and the interventional cardiology team since very often treatment of the aortic valve stenosis leads to  cessation of bleeding from the AVMs.    Probably the best option would be to [facility] so that she could actually undergo a small bowel enteroscopy as well as be evaluated for the TAVR procedure .    Thank you for involving me in the care of this patient.     I spoke to the patients son on the phone and  explained further care would be best served at a teritiary center due to lack of similar equipment as well as the cardiology team who deals with TAVR. Spent 30 mins on the phone in addition to discussing with the patient.     LOS: 1 day   [name], MD  12/22/2016, 8:40 AM

## 2016-12-22 NOTE — Telephone Encounter (Signed)
Patient is mother is currently hospitalized at [facility] with a GI bleed.  She has been transfused 2 units and her hemoglobin has improved.  She was seen by GI Dr. there and transferred [facility] as well as TAVR was recommended.  The daughter is concerned about transferring her [facility] for these procedures.  I discussed the situation with her.  I advised that they do not do small bowel enteroscopy at this facility.  It is appropriate to transfer her to an outside hospital for this.  When she is there, she can speak to the physicians there and then make a decision about whether she wishes to have her mother undergo the surgery there or bring [place] back to [place].  The daughter is in agreement with this as a plan.  Theodore DemarkRhonda Barrett, PA-C 12/22/2016 4:52 PM Beeper [phone]

## 2016-12-23 LAB — TYPE AND SCREEN
ABO/RH(D): A POS
ANTIBODY SCREEN: NEGATIVE
UNIT DIVISION: 0
Unit division: 0

## 2016-12-23 LAB — CBC
HEMATOCRIT: 29 % — AB (ref 35.0–47.0)
HEMOGLOBIN: 9.5 g/dL — AB (ref 12.0–16.0)
MCH: 29.6 pg (ref 26.0–34.0)
MCHC: 32.7 g/dL (ref 32.0–36.0)
MCV: 90.6 fL (ref 80.0–100.0)
Platelets: 147 10*3/uL — ABNORMAL LOW (ref 150–440)
RBC: 3.2 MIL/uL — AB (ref 3.80–5.20)
RDW: 17.8 % — ABNORMAL HIGH (ref 11.5–14.5)
WBC: 4.3 10*3/uL (ref 3.6–11.0)

## 2016-12-23 LAB — BPAM RBC
Blood Product Expiration Date: 201901012359
Blood Product Expiration Date: 201901102359
ISSUE DATE / TIME: 201812212011
ISSUE DATE / TIME: 201812221246
UNIT TYPE AND RH: 600
Unit Type and Rh: 6200

## 2016-12-23 LAB — BASIC METABOLIC PANEL
ANION GAP: 7 (ref 5–15)
BUN: 17 mg/dL (ref 6–20)
CHLORIDE: 112 mmol/L — AB (ref 101–111)
CO2: 22 mmol/L (ref 22–32)
Calcium: 8.6 mg/dL — ABNORMAL LOW (ref 8.9–10.3)
Creatinine, Ser: 0.69 mg/dL (ref 0.44–1.00)
GFR calc Af Amer: >60 mL/min (ref >60)
GFR calc non Af Amer: >60 mL/min (ref >60)
Glucose, Bld: 84 mg/dL (ref 65–99)
POTASSIUM: 3.7 mmol/L (ref 3.5–5.1)
Sodium: 141 mmol/L (ref 135–145)

## 2016-12-23 NOTE — Discharge Summary (Signed)
[facility] [place] at [facility], 83 y.o., DOB [birth year], MRN [id number]. 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  She was transfused her hemoglobin is stable.  She was seen in consultation by GI.  Who felt that her recurrent AVM bleeding is due to her severe aortic stenosis.  According to GI aortic stenosis needs to be repaired to prevent further GI bleeding.  Patient's hemoglobin is now stable she is not having any further bleeding she wants to be discharged home with follow-up with primary care provider next week to have her hemoglobin checked.  Patient currently stable for discharge.            Consults  GI  Significant Tests:  See full reports for all details    No results found.     Today   Subjective:   Kelsey Le patient feeling better denies any bleeding  Objective:   Blood pressure (!) 173/71, pulse 77, temperature 98.1 F (36.7 C), temperature source Oral, resp. rate 16, height 5\' 3"  (1.6 m), weight 115 lb (52.2 kg), SpO2 99 %.  .  Intake/Output Summary (Last 24 hours) at 12/23/2016 1350 Last data filed at 12/23/2016 1017 Gross per 24 hour  Intake 930 ml  Output -  Net 930 ml    Exam VITAL SIGNS: Blood pressure (!) 173/71, pulse 77, temperature 98.1 F (36.7 C), temperature source Oral, resp. rate 16, height 5\' 3"   (1.6 m), weight 115 lb (52.2 kg), SpO2 99 %.  GENERAL:  83 y.o.-year-old patient lying in the bed with no acute distress.  EYES: Pupils equal, round, reactive to light and accommodation. No scleral icterus. Extraocular muscles intact.  HEENT: Head atraumatic, normocephalic. Oropharynx and nasopharynx clear.  NECK:  Supple, no jugular venous distention. No thyroid enlargement, no tenderness.  LUNGS: Normal breath sounds bilaterally, no wheezing, rales,rhonchi or crepitation. No use of accessory muscles of respiration.  CARDIOVASCULAR: S1, S2 normal. No murmurs, rubs, or gallops.  ABDOMEN: Soft, nontender, nondistended. Bowel sounds present. No organomegaly or mass.  EXTREMITIES: No pedal edema, cyanosis, or clubbing.  NEUROLOGIC: Cranial nerves II through XII are intact. Muscle strength 5/5 in all extremities. Sensation intact. Gait not checked.  PSYCHIATRIC: The patient is alert and oriented x 3.  SKIN: No obvious rash, lesion, or ulcer.   Data Review     CBC w Diff:  Lab Results  Component Value Date   WBC 4.3 12/23/2016   HGB 9.5 (L) 12/23/2016   HGB 10.4 (L) 03/30/2014   HCT 29.0 (L) 12/23/2016   HCT 31.9 (L) 03/30/2014   PLT 147 (L) 12/23/2016   PLT 184 03/30/2014   LYMPHOPCT 20 12/21/2016   LYMPHOPCT 28.4 03/30/2014   MONOPCT 7 12/21/2016   MONOPCT 6.1 03/30/2014   EOSPCT 1 12/21/2016   EOSPCT 1.4 03/30/2014   BASOPCT 1 12/21/2016   BASOPCT 1.6 03/30/2014   CMP:  Lab Results  Component Value  Date   NA 141 12/23/2016   NA 144 01/04/2014   K 3.7 12/23/2016   K 3.7 01/04/2014   CL 112 (H) 12/23/2016   CL 112 (H) 01/04/2014   CO2 22 12/23/2016   CO2 25 01/04/2014   BUN 17 12/23/2016   BUN 22 (H) 01/04/2014   CREATININE 0.69 12/23/2016   CREATININE 0.94 01/04/2014   PROT 6.2 (L) 12/21/2016   PROT 6.3 (L) 01/04/2014   ALBUMIN 4.1 12/21/2016   ALBUMIN 3.2 (L) 01/04/2014   BILITOT 0.4 12/21/2016   BILITOT 0.4 01/04/2014   ALKPHOS 57 12/21/2016   ALKPHOS 57  01/04/2014   AST 27 12/21/2016   AST 28 01/04/2014   ALT 15 12/21/2016   ALT 19 01/04/2014  .  Micro Results No results found for this or any previous visit (from the past 240 hour(s)).      Code Status Orders  (From admission, onward)        Start     Ordered   12/21/16 1901  Full code  Continuous     12/21/16 1900    Code Status History    Date Active Date Inactive Code Status Order ID Comments User Context   10/26/2016 18:18 10/27/2016 15:28 Full Code [id number]  Adrian SaranMody, Sital, MD Inpatient   10/11/2016 01:06 10/12/2016 19:49 Full Code [id number]  Gery Prayrosley, Debby, MD Inpatient   10/05/2016 14:26 10/05/2016 21:35 Full Code [id number]  Kathleene HazelMcAlhany, Christopher D, MD Inpatient   11/17/2015 22:51 11/18/2015 17:06 Full Code [id number]  Juanell FairlyKrasinski, Kevin, MD Inpatient   11/17/2015 22:51 11/17/2015 22:51 Full Code [id number]  Gracelyn NurseJohnston, John D, MD Inpatient   11/14/2015 19:19 11/16/2015 20:25 Full Code [id number]  Enedina FinnerPatel, Sona, MD Inpatient   08/19/2014 14:52 08/19/2014 18:42 Full Code [id number]  Kennedy BuckerMenz, Michael, MD Inpatient          Follow-up Information    Marguarite ArbourSparks, Jeffrey D, MD Follow up in 4 day(s).   Specialty:  Internal Medicine Why:  check cbc Contact information: [address] [place] [address] [phone]           Discharge Medications   Allergies as of 12/23/2016      Reactions   Aspirin    GI bleeding    Penicillins Rash, Other (See Comments)   Has patient had a PCN reaction causing immediate rash, facial/tongue/throat swelling, SOB or lightheadedness with hypotension: Yes Has patient had a PCN reaction causing severe rash involving mucus membranes or skin necrosis: No Has patient had a PCN reaction that required hospitalization: No Has patient had a PCN reaction occurring within the last 10 years: No If all of the above answers are "NO", then may proceed with Cephalosporin use.      Medication List    TAKE these medications   acetaminophen 325 MG  tablet Commonly known as:  TYLENOL Take 325 mg by mouth every 6 (six) hours as needed (for pain.).   acidophilus Caps capsule Take 1 capsule by mouth daily.   cholecalciferol 400 units Tabs tablet Commonly known as:  VITAMIN D Take 800 Units by mouth daily.   meclizine 25 MG tablet Commonly known as:  ANTIVERT Take 1 tablet (25 mg total) by mouth 3 (three) times daily as needed for dizziness.   pantoprazole 40 MG tablet Commonly known as:  PROTONIX Take 1 tablet (40 mg total) by mouth daily.   vitamin B-12 1000 MCG tablet Commonly known as:  CYANOCOBALAMIN Take 1,000 mcg by mouth daily.  Total Time in preparing paper work, data evaluation and todays exam - 35 minutes  Auburn BilberryPATEL, Guyla Bless M.D on 12/23/2016 at 1:50 PM  [facility]   Office  [phone]

## 2016-12-23 NOTE — Discharge Instructions (Signed)
[facility] - [place] at [facility] ° °DIET:  °Low fat diet ° °DISCHARGE CONDITION:  °Stable ° °ACTIVITY:  °Activity as tolerated ° °OXYGEN:  °Home Oxygen: No. °  °Oxygen Delivery: room air ° °DISCHARGE LOCATION:  °home ° ° °ADDITIONAL DISCHARGE INSTRUCTION: ° ° °If you experience worsening of your admission symptoms, develop shortness of breath, life threatening emergency, suicidal or homicidal thoughts you must seek medical attention immediately by calling 911 or calling your MD immediately  if symptoms less severe. ° °You Must read complete instructions/literature along with all the possible adverse reactions/side effects for all the Medicines you take and that have been prescribed to you. Take any new Medicines after you have completely understood and accpet all the possible adverse reactions/side effects.  ° °Please note ° °You were cared for by a hospitalist during your hospital stay. If you have any questions about your discharge medications or the care you received while you were in the hospital after you are discharged, you can call the unit and asked to speak with the hospitalist on call if the hospitalist that took care of you is not available. Once you are discharged, your primary care physician will handle any further medical issues. Please note that NO REFILLS for any discharge medications will be authorized once you are discharged, as it is imperative that you return to your primary care physician (or establish a relationship with a primary care physician if you do not have one) for your aftercare needs so that they can reassess your need for medications and monitor your lab values. ° ° °

## 2016-12-23 NOTE — Progress Notes (Signed)
Wyline MoodKiran Emmons Toth , MD [address], [address], [place], [place], [address] [address], [place], [place], [address] Phone: [phone]  Fax: [phone]   Kelsey Le is being followed for melena  Day 2 of follow up   Subjective: No bowel movements , doing well   Objective: Vital signs in last 24 hours: Vitals:   12/22/16 1315 12/22/16 1610 12/22/16 2014 12/23/16 0508  BP: (!) 124/54 (!) 132/47 (!) 141/72 (!) 173/71  Pulse: 80 77 81 77  Resp: 16 18 16 16   Temp: 98.9 F (37.2 C) 98.6 F (37 C) 98 F (36.7 C) 98.1 F (36.7 C)  TempSrc: Oral Oral Oral Oral  SpO2: 98% 100% 99% 99%  Weight:      Height:       Weight change:   Intake/Output Summary (Last 24 hours) at 12/23/2016 1108 Last data filed at 12/23/2016 1017 Gross per 24 hour  Intake 1036.67 ml  Output -  Net 1036.67 ml     Exam: Heart:: Regular rate and rhythm, S1S2 present or without murmur or extra heart sounds Lungs: normal, clear to auscultation and clear to auscultation and percussion Abdomen: soft, nontender, normal bowel sounds   Lab Results: CBC Latest Ref Rng & Units 12/23/2016 12/22/2016 12/21/2016  WBC 3.6 - 11.0 K/uL 4.3 5.9 5.3  Hemoglobin 12.0 - 16.0 g/dL 5.2(W9.5(L) 7.7(L) 6.9(L)  Hematocrit 35.0 - 47.0 % 29.0(L) 22.7(L) 21.3(L)  Platelets 150 - 440 K/uL 147(L) 131(L) 166    Micro Results: No results found for this or any previous visit (from the past 240 hour(s)). Studies/Results: No results found. Medications: I have reviewed the patient's current medications. Scheduled Meds: . acidophilus  1 capsule Oral Daily  . pantoprazole (PROTONIX) IV  40 mg Intravenous Q12H   Continuous Infusions: . sodium chloride Stopped (12/21/16 2010)   PRN Meds:.acetaminophen **OR** acetaminophen, albuterol, bisacodyl, HYDROcodone-acetaminophen, meclizine, ondansetron **OR** ondansetron (ZOFRAN) IV, senna-docusate   Assessment: Active Problems:   GIB (gastrointestinal bleeding)  Kelsey Le 83 y.o. female   with a history of severe aortic stenosis multiple episodes of anemia requiring transfusion.  She has had a few upper endoscopies over the last few years which have essentially been normal.  She has had a capsule study in February 2018 which has demonstrated AVMs throughout the small bowel which is very likely source of recurrent bleeds .  Unclear why this was not acted upon subsequently.  She has not been referred  for a small bowel enteroscopy as per my review of epic.  I think the most likely source of her bleed is the small bowel .Marland Kitchen.  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  The permanent treatment for this condition would actually be to treat the valve which has been shown that treatment of aortic stenosis leads  to cessation of bleeding from the AVMs.This condition usually occurs from aortic stenosis due to development of an acquired von Willebrand's factor dysfunction which leads to development of bleeds.  It is  likely that correction of the aortic stenosis would lead to reversal of this process and hence probably even anticoagulation would be probably considered at that point of time.   Plan 1. Due to ongoing melena would best be treated with enteroscopy and ablation which can [facility] be [facility] only  [facility]. Once stable from the bleeding she should proceed with her TAVR procedure either at he same hospital or at [facility] which would be the more definite treatment for the valve as well as for the GI bleed. Continue PPI   2. IF ok from cardiology point of view, Sandostatin/octreotide may help reduce bleeding from the AVM's.   3. Long discussion over 45 minutes with family in the room. They wish to take her home. Explained since no melena and Hb stable , that may be an option  but would need a repeat CBC at Dr Judithann SheenSparks office day after X mas to ensure if stable. IF has further melena to [facility] and not [facility] . She will get in touch with her cardiologist to see if they can coordinate [facility] vs [facility] and [facility] . I have given her my card to contact my office if has any questions.     LOS: 2 days   Wyline MoodKiran Gregroy Dombkowski, MD 12/23/2016, 11:08 AM

## 2016-12-23 NOTE — Progress Notes (Signed)
Pt discharged per MD order. IV removed. Discharge instructions reviewed with pt. Questions answered to pt satisfaction. Pt taken to car in wheelchair by staff.  

## 2016-12-27 NOTE — Telephone Encounter (Signed)
Thanks

## 2017-01-09 ENCOUNTER — Encounter: Payer: Self-pay | Admitting: Cardiovascular Disease

## 2017-01-09 ENCOUNTER — Ambulatory Visit (INDEPENDENT_AMBULATORY_CARE_PROVIDER_SITE_OTHER): Payer: Medicare Other | Admitting: Cardiovascular Disease

## 2017-01-09 VITALS — BP 160/60 | HR 70 | Ht 65.0 in | Wt 112.4 lb

## 2017-01-09 DIAGNOSIS — I350 Nonrheumatic aortic (valve) stenosis: Secondary | ICD-10-CM

## 2017-01-09 NOTE — Patient Instructions (Signed)
Medication Instructions:  Your physician recommends that you continue on your current medications as directed. Please refer to the Current Medication list given to you today.  Labwork: No new orders.   Testing/Procedures: TAVR evaluation.  Follow-Up: I will contact you by phone to discuss additional TAVR testing.   Any Other Special Instructions Will Be Listed Below (If Applicable).     If you need a refill on your cardiac medications before your next appointment, please call your pharmacy.

## 2017-01-09 NOTE — Progress Notes (Signed)
Multi-Disciplinary Valve Clinic Note  Chief Complaint  Kelsey Le presents with  . Dizziness  . Fatigue  . Shortness of Breath    History of Present Illness: 84 yo female with history of iron deficiency anemia, prior GI bleeding due to AVMs, asthma and severe aortic valve stenosis who is here today for follow up in the valve clinic. I saw her as a new consult 10/01/16 at the request of Dr. Kirke Corin for discussion regarding her aortic valve stenosis and possible TAVR. She has prior GI bleeding due to AVMs but over the 6 months prior to her first visit with me, this had been stable. She has been followed for years for aortic stenosis. Echo 08/28/16 with normal LV systolic function, LVEF=60-65%. The aortic valve stenosis has progressed. Mean gradient 65 mmHg, peak gradient 106 mmHg, AVA 0.75 cm2. No mitral valve disease. She was seen in the ED at [facility] on 09/28/16 with c/o headache and dizziness. CTA head in th ED showed a small (2mm) IC aneurysm, mild carotid disease and a 10 mm nodular lesion in the left vallecula 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  She has seen Neurosurgery and no further workup is indicated. She has seen ENT and there is no workup planned for the tongue abnormality.  She described worsened dyspnea with dizziness at her first visit in my office. We discussed the TAVR procedure in detail. Cardiac cath 10/05/16 with no evidence of CAD. There was severe AS with mean gradient 42 mmHg, peak to peak gradient 52 mmHg, AVA 0.69 cm2. I started ASA 81 mg daily post cath to see if she could tolerate this with history of AVMs and bleeding. She was admitted to [facility] 10/11/16, after one week of ASA therapy with weakness and melena, most likely due to AVMs. Hgb dropped from 11.9 to 6.1. ASA was stopped. EGD on 10/12/16 showed gastritis only. GI consult team felt that her bleeding was likely from her AVMs. She was given 3 units of pRBCs and Hgb was 10 prior to d/c home. Her melena resolved off  of ASA. She was then readmitted on 10/26/16 at [facility] with recurrent GI bleeding and Hgb was 7.0. She was transfused and had an upper endoscopy. She was treated for H Pylori and her bleeding was felt to be due to AVMs. She was most recently admitted to [facility] on 12/21/16  with recurrent anemia and GI bleeding. Hgb was down to 6.9. She was transfused and discharged home. Capsule study in May 2018 with obvious AVMs.   She is here today for follow up. The Kelsey Le denies any chest pain, palpitations, lower extremity edema, orthopnea, PND, dizziness, near syncope or syncope. She continues to have dyspnea on exertion, dizziness without syncope and fatigue.    Primary Care Physician: Marguarite Arbour, MD Primary Cardiologist: Kirke Corin   Past Medical History:  Diagnosis Date  . Aortic stenosis    a. TTE 8/17: nl EF, mod to sev AS w/ mean gradient 28 mmHg, valve area 0.97, mod pulm HTN, small pericardial effusion  . Arrhythmia   . Asthma   . AVM (arteriovenous malformation)    a. small intestine   . Broken back   . Broken wrist   . Hip fracture (HCC)    a. in the setting of mechanical fall  . Iron deficiency anemia    a. requiring periodic transfusions  . Syncope and collapse     Past Surgical History:  Procedure Laterality Date  . APPENDECTOMY    .  BACK SURGERY     back fusion  . ESOPHAGOGASTRODUODENOSCOPY (EGD) WITH PROPOFOL N/A 11/15/2015   Procedure: ESOPHAGOGASTRODUODENOSCOPY (EGD) WITH PROPOFOL;  Surgeon: [name], MD;  Location: [facility] ENDOSCOPY;  Service: Endoscopy;  Laterality: N/A;  . ESOPHAGOGASTRODUODENOSCOPY (EGD) WITH PROPOFOL N/A 10/12/2016   Procedure: ESOPHAGOGASTRODUODENOSCOPY (EGD) WITH PROPOFOL;  Surgeon: [name], MD;  Location: [facility] ENDOSCOPY;  Service: Endoscopy;  Laterality: N/A;  . FRACTURE SURGERY    . GIVENS CAPSULE STUDY N/A 02/20/2016   Procedure: GIVENS CAPSULE STUDY;  Surgeon: Scot Jun, MD;  Location: [facility] ENDOSCOPY;  Service: Endoscopy;  Laterality:  N/A;  . HIP FRACTURE SURGERY    . INTRAMEDULLARY (IM) NAIL INTERTROCHANTERIC Right 11/18/2015   Procedure: INTRAMEDULLARY (IM) NAIL INTERTROCHANTRIC;  Surgeon: Juanell Fairly, MD;  Location: [facility] ORS;  Service: Orthopedics;  Laterality: Right;  . KYPHOPLASTY N/A 08/19/2014   Procedure: KYPHOPLASTY;  Surgeon: Kennedy Bucker, MD;  Location: [facility] ORS;  Service: Orthopedics;  Laterality: N/A;  . PARTIAL HYSTERECTOMY    . RIGHT/LEFT HEART CATH AND CORONARY ANGIOGRAPHY N/A 10/05/2016   Procedure: RIGHT/LEFT HEART CATH AND CORONARY ANGIOGRAPHY;  Surgeon: Kathleene Hazel, MD;  Location: [facility] INVASIVE CV LAB;  Service: Cardiovascular;  Laterality: N/A;  . TUBAL LIGATION      Current Outpatient Medications  Medication Sig Dispense Refill  . acetaminophen (TYLENOL) 325 MG tablet Take 325 mg by mouth every 6 (six) hours as needed (for pain.).    Marland Kitchen acidophilus (RISAQUAD) CAPS capsule Take 1 capsule by mouth daily.    . cholecalciferol (VITAMIN D) 400 units TABS tablet Take 800 Units by mouth daily.     . meclizine (ANTIVERT) 25 MG tablet Take 1 tablet (25 mg total) by mouth 3 (three) times daily as needed for dizziness. 15 tablet 0  . pantoprazole (PROTONIX) 40 MG tablet Take 1 tablet (40 mg total) by mouth daily. 30 tablet 0  . vitamin B-12 (CYANOCOBALAMIN) 1000 MCG tablet Take 1,000 mcg by mouth daily.     No current facility-administered medications for this visit.     Allergies  Allergen Reactions  . Aspirin     GI bleeding   . Penicillins Rash and Other (See Comments)    Has Kelsey Le had a PCN reaction causing immediate rash, facial/tongue/throat swelling, SOB or lightheadedness with hypotension: Yes Has Kelsey Le had a PCN reaction causing severe rash involving mucus membranes or skin necrosis: No Has Kelsey Le had a PCN reaction that required hospitalization: No Has Kelsey Le had a PCN reaction occurring within the last 10 years: No If all of the above answers are "NO", then may proceed with  Cephalosporin use.     Social History   Socioeconomic History  . Marital status: Divorced    Spouse name: Not on file  . Number of children: Not on file  . Years of education: Not on file  . Highest education level: Not on file  Social Needs  . Financial resource strain: Not on file  . Food insecurity - worry: Not on file  . Food insecurity - inability: Not on file  . Transportation needs - medical: Not on file  . Transportation needs - non-medical: Not on file  Occupational History  . Not on file  Tobacco Use  . Smoking status: Never Smoker  . Smokeless tobacco: Never Used  Substance and Sexual Activity  . Alcohol use: No  . Drug use: No  . Sexual activity: Not on file  Other Topics Concern  . Not on file  Social History  Narrative  . Not on file    Family History  Problem Relation Age of Onset  . Heart block Mother   . Heart failure Father   . Heart disease Sister   . Breast cancer Maternal Aunt     Review of Systems:  As stated in the HPI and otherwise negative.   BP (!) 160/60   Pulse 70   Ht 5\' 5"  (1.651 m)   Wt 112 lb 6.4 oz (51 kg)   SpO2 98%   BMI 18.70 kg/m   Physical Examination:  General: Well developed, well nourished, NAD  HEENT: OP clear, mucus membranes moist  SKIN: warm, dry. No rashes. Neuro: No focal deficits  Musculoskeletal: Muscle strength 5/5 all ext  Psychiatric: Mood and affect normal  Neck: No JVD, no carotid bruits, no thyromegaly, no lymphadenopathy.  Lungs:Clear bilaterally, no wheezes, rhonci, crackles Cardiovascular: Regular rate and rhythm.  Loud, harsh systolic murmur. No gallops or rubs. Abdomen:Soft. Bowel sounds present. Non-tender.  Extremities: No lower extremity edema. Pulses are 2 + in the bilateral DP/PT.  Cardiac cath 10/05/16:  1. No angiographic evidence of CAD 2. Severe aortic valve stenosis (mean gradient 42.4 mmHg, peak to peak gradient 52 mmHg, AVA 0.69 cm2).   Diagnostic Diagram       Implants       No implant documentation for this case.  MERGE Images   Show images for CARDIAC CATHETERIZATION   Link to Procedure Log   Procedure Log    Hemo Data    Most Recent Value  Fick Cardiac Output 5.54 L/min  Fick Cardiac Output Index 3.57 (L/min)/BSA  Aortic Mean Gradient 42.4 mmHg  Aortic Peak Gradient 52 mmHg  Aortic Valve Area 0.69  Aortic Value Area Index 0.45 cm2/BSA  RA A Wave 2 mmHg  RA V Wave 2 mmHg  RA Mean 1 mmHg  RV Systolic Pressure 23 mmHg  RV Diastolic Pressure 2 mmHg  RV EDP 4 mmHg  PA Systolic Pressure 22 mmHg  PA Diastolic Pressure 10 mmHg  PA Mean 15 mmHg  PW A Wave 13 mmHg  PW V Wave 13 mmHg  PW Mean 8 mmHg  AO Systolic Pressure 125 mmHg  AO Diastolic Pressure 54 mmHg  AO Mean 85 mmHg  LV Systolic Pressure 208 mmHg  LV Diastolic Pressure 5 mmHg  LV EDP 17 mmHg  Arterial Occlusion Pressure Extended Systolic Pressure 166 mmHg  Arterial Occlusion Pressure Extended Diastolic Pressure 75 mmHg  Arterial Occlusion Pressure Extended Mean Pressure 116 mmHg  Left Ventricular Apex Extended Systolic Pressure 218 mmHg  Left Ventricular Apex Extended Diastolic Pressure 7 mmHg  Left Ventricular Apex Extended EDP Pressure 17 mmHg  QP/QS 1  TPVR Index 4.2 HRUI  TSVR Index 23.79 HRUI  PVR SVR Ratio 0.08  TPVR/TSVR Ratio 0.18     Echo 08/28/16: Left ventricle: The cavity size was normal. There was mild   concentric hypertrophy. Systolic function was normal. The   estimated ejection fraction was in the range of 60% to 65%. Wall   motion was normal; there were no regional wall motion   abnormalities. Doppler parameters are consistent with abnormal   left ventricular relaxation (grade 1 diastolic dysfunction). - Aortic valve: There was severe stenosis. Mean gradient (S): 65 mm   Hg. Valve area (VTI): 0.75 cm^2. - Mitral valve: Valve area by pressure half-time: 2.06 cm^2. - Pulmonary arteries: Systolic pressure was mildly increased. PA   peak pressure: 38 mm Hg  (S).  Left ventricle:  The cavity size was normal. There was mild concentric hypertrophy. Systolic function was normal. The estimated ejection fraction was in the range of 60% to 65%. Wall motion was normal; there were no regional wall motion abnormalities. Doppler parameters are consistent with abnormal left ventricular relaxation (grade 1 diastolic dysfunction).  ------------------------------------------------------------------- Aortic valve:   Trileaflet; mildly thickened, severely calcified leaflets.  Doppler:   There was severe stenosis.   There was no regurgitation.    VTI ratio of LVOT to aortic valve: 0.27. Valve area (VTI): 0.75 cm^2. Indexed valve area (VTI): 0.49 cm^2/m^2. Mean velocity ratio of LVOT to aortic valve: 0.25. Valve area (Vmean): 0.71 cm^2. Indexed valve area (Vmean): 0.46 cm^2/m^2. Mean gradient (S): 65 mm Hg. Peak gradient (S): 106 mm Hg.  ------------------------------------------------------------------- Aorta:  Aortic root: The aortic root was normal in size.  ------------------------------------------------------------------- Mitral valve:   Structurally normal valve.   Mobility was not restricted.  Doppler:  Transvalvular velocity was within the normal range. There was no evidence for stenosis. There was no regurgitation.    Valve area by pressure half-time: 2.06 cm^2. Indexed valve area by pressure half-time: 1.34 cm^2/m^2.    Peak gradient (D): 4 mm Hg.  ------------------------------------------------------------------- Left atrium:  The atrium was normal in size.  ------------------------------------------------------------------- Right ventricle:  The cavity size was normal. Wall thickness was normal. Systolic function was normal.  ------------------------------------------------------------------- Pulmonic valve:    Doppler:  Transvalvular velocity was within the normal range. There was no evidence for  stenosis.  ------------------------------------------------------------------- Tricuspid valve:   Structurally normal valve.    Doppler: Transvalvular velocity was within the normal range. There was mild regurgitation.  ------------------------------------------------------------------- Pulmonary artery:   The main pulmonary artery was normal-sized. Systolic pressure was mildly increased.  ------------------------------------------------------------------- Right atrium:  The atrium was normal in size.  ------------------------------------------------------------------- Pericardium:  There was no pericardial effusion.  ------------------------------------------------------------------- Systemic veins: Inferior vena cava: Poorly visualized. The vessel was normal in size.  ------------------------------------------------------------------- Measurements   Left ventricle                           Value          Reference  LV ID, ED, PLAX chordal          (L)     32    mm       43 - 52  LV ID, ES, PLAX chordal          (L)     19    mm       23 - 38  LV fx shortening, PLAX chordal           41    %        >=29  LV PW thickness, ED                      11    mm       ----------  IVS/LV PW ratio, ED                      1              <=1.3  Stroke volume, 2D                        93    ml       ----------  Stroke volume/bsa, 2D  61    ml/m^2   ----------  LV e&', lateral                           10.3  cm/s     ----------  LV E/e&', lateral                         9.09           ----------  LV e&', medial                            5.87  cm/s     ----------  LV E/e&', medial                          15.95          ----------  LV e&', average                           8.09  cm/s     ----------  LV E/e&', average                         11.58          ----------    Ventricular septum                       Value          Reference  IVS thickness, ED                         11    mm       ----------    LVOT                                     Value          Reference  LVOT ID, S                               19    mm       ----------  LVOT area                                2.84  cm^2     ----------  LVOT ID                                  19    mm       ----------  LVOT mean velocity, S                    94.1  cm/s     ----------  LVOT VTI, S                              32.9  cm       ----------  Stroke volume (SV), LVOT DP  93.3  ml       ----------  Stroke index (SV/bsa), LVOT DP           60.8  ml/m^2   ----------    Aortic valve                             Value          Reference  Aortic valve peak velocity, S            515   cm/s     ----------  Aortic valve mean velocity, S            378   cm/s     ----------  Aortic valve VTI, S                      124   cm       ----------  Aortic mean gradient, S                  65    mm Hg    ----------  Aortic peak gradient, S                  106   mm Hg    ----------  VTI ratio, LVOT/AV                       0.27           ----------  Aortic valve area, VTI                   0.75  cm^2     ----------  Aortic valve area/bsa, VTI               0.49  cm^2/m^2 ----------  Velocity ratio, mean, LVOT/AV            0.25           ----------  Aortic valve area, mean velocity         0.71  cm^2     ----------  Aortic valve area/bsa, mean              0.46  cm^2/m^2 ----------  velocity    Aorta                                    Value          Reference  Aortic root ID, ED                       26    mm       ----------  Ascending aorta ID, A-P, S               28    mm       ----------    Left atrium                              Value          Reference  LA ID, A-P, ES                           28    mm       ----------  LA ID/bsa, A-P                           1.83  cm/m^2   <=2.2  LA volume, ES, 1-p A4C                   31.4  ml       ----------  LA volume/bsa, ES, 1-p A4C                20.5  ml/m^2   ----------  LA volume, ES, 1-p A2C                   47.6  ml       ----------  LA volume/bsa, ES, 1-p A2C               31    ml/m^2   ----------    Mitral valve                             Value          Reference  Mitral E-wave peak velocity              93.6  cm/s     ----------  Mitral A-wave peak velocity              121   cm/s     ----------  Mitral deceleration time         (H)     366   ms       150 - 230  Mitral pressure half-time                107   ms       ----------  Mitral peak gradient, D                  4     mm Hg    ----------  Mitral E/A ratio, peak                   0.8            ----------  Mitral valve area, PHT, DP               2.06  cm^2     ----------  Mitral valve area/bsa, PHT, DP           1.34  cm^2/m^2 ----------    Pulmonary arteries                       Value          Reference  PA pressure, S, DP               (H)     38    mm Hg    <=30    Tricuspid valve                          Value          Reference  Tricuspid regurg peak velocity           286   cm/s     ----------  Tricuspid peak RV-RA gradient            33    mm Hg    ----------    Right atrium  Value          Reference  RA ID, S-I, ES, A4C                      40.9  mm       34 - 49  RA area, ES, A4C                         9.17  cm^2     8.3 - 19.5  RA volume, ES, A/L                       16.2  ml       ----------  RA volume/bsa, ES, A/L                   10.6  ml/m^2   ----------    Right ventricle                          Value          Reference  TAPSE                                    26.2  mm       ----------  RV s&', lateral, S                        12.7  cm/s     ----------    Pulmonic valve                           Value          Reference  Pulmonic valve peak velocity, S          123   cm/s     ----------  EKG:  EKG is not  ordered today. The ekg ordered today demonstrates   Recent Labs: 12/21/2016: ALT  15 12/23/2016: BUN 17; Creatinine, Ser 0.69; Hemoglobin 9.5; Platelets 147; Potassium 3.7; Sodium 141   Lipid Panel No results found for: CHOL, TRIG, HDL, CHOLHDL, VLDL, LDLCALC, LDLDIRECT   Wt Readings from Last 3 Encounters:  01/09/17 112 lb 6.4 oz (51 kg)  12/21/16 115 lb (52.2 kg)  11/26/16 115 lb (52.2 kg)     Other studies Reviewed: Additional studies/ records that were reviewed today include: Echo images Review of the above records demonstrates: severe AS, normal LV systolic function  STS Risk Score:  Risk of Mortality: 2.9%  Morbidity or Mortality: 15.341%  Long Length of Stay: 6.955%  Short Length of Stay: 29.461%  Permanent Stroke: 1.877%  Prolonged Ventilation: 9.19%  DSW Infection: 0.124%  Renal Failure: 2.579%  Reoperation: 8.258%  Assessment and Plan:   1. Severe aortic valve stenosis: She has stage D, symptomatic aortic valve stenosis. As noted in prior discussions, she would benefit from AVR. We have discussed TAVR in detail. She is felt to be a good candidate for TAVR based on her disease process but this has been delayed given recurrent GI bleeding. Her GI bleeding has occurred both on and off of ASA for years. She is felt to have AVMs. She has been [facility] team [facility] and suggestion has been made to [facility]. It  is noted that they would likely not do this procedure with the presence of underlying severe AS. Given the association of AS with AVMs, it is possible that treatment of her AS would improve her bleeding from the AVMs.   The TAVR team has extensively reviewed her findings. I have had multiple discussions with the Kelsey Le and her family regarding her AVMs, AS and options for treatment. Her Hgb is currently stable at 10.0 earlier this week. I think we should move forward with TAVR planning and she will receive blood transfusions as needed over the next 6 weeks. If we perform TAVR, would not plan to use anti-platelet  therapy or anti-coagulation. The TAVR team feels that this is the best approach. There will be risk of valve thrombosis without antiplatelet therapy and the pt and family understands this. If we can get her through the TAVR, this may help her GI bleeding. If she has recurrent GI bleeding post TAVR, she may then be able to go to [facility] to have the push enteroscopy and ablation of her AVMs.  If we do not perform TAVR, she will most certainly have progression of her symptoms due to AS. Will arrange CT scans, carotid dopplers, PFTs, PT eval and referral to both surgeons on the TAVR team.   Repeat CBC in primary care next week. She will need weekly CBCs.    Current medicines are reviewed at length with the Kelsey Le today.  The Kelsey Le does not have concerns regarding medicines.  The following changes have been made:  no change  Labs/ tests ordered today include:   No orders of the defined types were placed in this encounter.  Disposition:   FU with the TAVR team.   Complex decision making during this visit.   Signed, Verne Carrow, MD 01/09/2017 10:30 AM    [facility] [address] [place], [place], [place]  [address] Phone: [phone]; Fax: [phone]

## 2017-01-13 NOTE — Progress Notes (Deleted)
[place]  [phone]  [phone]     Kelsey Le DOB: 05-08-1930  MR#: [id number]  AOZ#:[id number]  Patient Care Team: Idelle Crouch, MD as PCP - General (Internal Medicine) Lloyd Huger, MD as Consulting Physician (Oncology)  Chief complaint: Iron deficiency anemia secondary to GI blood loss.  INTERVAL HISTORY: Patient returns to clinic today for further evaluation and laboratory work.  She missed her appointment last week secondary to a bout of C. difficile.  She continues to have weakness and fatigue, but denies any further black tarry stools or diarrhea. She has no neurologic complaints. She denies any recent fevers or illnesses. She has no chest pain, shortness of breath, or cough.  She denies nausea, vomiting, constipation, or diarrhea. She has no urinary complaints. Patient offers no specific complaints today.   REVIEW OF SYSTEMS:   Review of Systems  Constitutional: Positive for malaise/fatigue. Negative for chills and fever.  Respiratory: Negative.  Negative for cough, hemoptysis and shortness of breath.   Cardiovascular: Negative.  Negative for chest pain, palpitations and leg swelling.  Gastrointestinal: Negative.  Negative for abdominal pain, blood in stool, constipation, diarrhea, melena, nausea and vomiting.  Genitourinary: Negative.  Negative for hematuria.  Musculoskeletal: Positive for joint pain.  Skin: Negative.  Negative for rash.  Neurological: Negative for dizziness and headaches.  Endo/Heme/Allergies: Negative.   Psychiatric/Behavioral: Negative.  The patient is not nervous/anxious.     As per HPI. Otherwise, a complete review of systems is negative.  ONCOLOGY HISTORY:  No history exists.    PAST MEDICAL HISTORY: Past Medical History:  Diagnosis Date  . Aortic stenosis    a. TTE 8/17: nl EF, mod to sev AS w/ mean gradient 28 mmHg, valve area 0.97, mod pulm HTN, small pericardial effusion  . Arrhythmia    . Asthma   . AVM (arteriovenous malformation)    a. small intestine   . Broken back   . Broken wrist   . Hip fracture (Micro)    a. in the setting of mechanical fall  . Iron deficiency anemia    a. requiring periodic transfusions  . Syncope and collapse     PAST SURGICAL HISTORY: Past Surgical History:  Procedure Laterality Date  . APPENDECTOMY    . BACK SURGERY     back fusion  . ESOPHAGOGASTRODUODENOSCOPY (EGD) WITH PROPOFOL N/A 11/15/2015   Procedure: ESOPHAGOGASTRODUODENOSCOPY (EGD) WITH PROPOFOL;  Surgeon: Lucilla Lame, MD;  Location: [facility] ENDOSCOPY;  Service: Endoscopy;  Laterality: N/A;  . ESOPHAGOGASTRODUODENOSCOPY (EGD) WITH PROPOFOL N/A 10/12/2016   Procedure: ESOPHAGOGASTRODUODENOSCOPY (EGD) WITH PROPOFOL;  Surgeon: Lucilla Lame, MD;  Location: [facility] ENDOSCOPY;  Service: Endoscopy;  Laterality: N/A;  . FRACTURE SURGERY    . GIVENS CAPSULE STUDY N/A 02/20/2016   Procedure: GIVENS CAPSULE STUDY;  Surgeon: Manya Silvas, MD;  [facility] END[facility]COPY;  Service: Endoscopy;  Laterality: N/A;  . HIP FRACTURE SURGERY    . INTRAMEDULLARY (IM) NAIL INTERTROCHANTERIC Right 11/18/2015   Procedure: INTRAMEDULLARY (IM) NAIL INTERTROCHANTRIC;  Surgeon: Thornton Park, MD;  Location: [facility] ORS;  Service: Orthopedics;  Laterality: Right;  . KYPHOPLASTY N/A 08/19/2014   Procedure: KYPHOPLASTY;  Surgeon: Hessie Knows, MD;  Location: [facility] ORS;  Service: Orthopedics;  Laterality: N/A;  . PARTIAL HYSTERECTOMY    . RIGHT/LEFT HEART CATH AND CORONARY ANGIOGRAPHY N/A 10/05/2016   Procedure: RIGHT/LEFT HEART CATH AND CORONARY ANGIOGRAPHY;  Surgeon: Burnell Blanks, MD;  Location: [place] CV LAB;  Service: Cardiovascular;  Laterality: N/A;  .  TUBAL LIGATION      FAMILY HISTORY Family History  Problem Relation Age of Onset  . Heart block Mother   . Heart failure Father   . Heart disease Sister   . Breast cancer Maternal Aunt     GYNECOLOGIC HISTORY:  No LMP recorded. Patient  has had a hysterectomy.     ADVANCED DIRECTIVES:    HEALTH MAINTENANCE: Social History   Tobacco Use  . Smoking status: Never Smoker  . Smokeless tobacco: Never Used  Substance Use Topics  . Alcohol use: No  . Drug use: No    Allergies  Allergen Reactions  . Aspirin     GI bleeding   . Penicillins Rash and Other (See Comments)    Has patient had a PCN reaction causing immediate rash, facial/tongue/throat swelling, SOB or lightheadedness with hypotension: Yes Has patient had a PCN reaction causing severe rash involving mucus membranes or skin necrosis: No Has patient had a PCN reaction that required hospitalization: No Has patient had a PCN reaction occurring within the last 10 years: No If all of the above answers are "NO", then may proceed with Cephalosporin use.     Current Outpatient Medications  Medication Sig Dispense Refill  . acetaminophen (TYLENOL) 325 MG tablet Take 325 mg by mouth every 6 (six) hours as needed (for pain.).    Marland Kitchen acidophilus (RISAQUAD) CAPS capsule Take 1 capsule by mouth daily.    . cholecalciferol (VITAMIN D) 400 units TABS tablet Take 800 Units by mouth daily.     . meclizine (ANTIVERT) 25 MG tablet Take 1 tablet (25 mg total) by mouth 3 (three) times daily as needed for dizziness. 15 tablet 0  . pantoprazole (PROTONIX) 40 MG tablet Take 1 tablet (40 mg total) by mouth daily. 30 tablet 0  . vitamin B-12 (CYANOCOBALAMIN) 1000 MCG tablet Take 1,000 mcg by mouth daily.     No current facility-administered medications for this visit.     OBJECTIVE: There were no vitals taken for this visit.   There is no height or weight on file to calculate BMI.    ECOG FS:1 - Symptomatic but completely ambulatory  General: Well-developed, well-nourished, no acute distress. Eyes: Pink conjunctiva, anicteric sclera. HEENT: Normocephalic, moist mucous membranes, clear oropharnyx. Lungs: Clear to auscultation bilaterally. Heart: Regular rate and rhythm. Positive  for murmur Musculoskeletal: Non-pitting edema in bilateral ankles. No cyanosis or clubbing. Neuro: Alert, answering all questions appropriately.  Skin: No rashes or petechiae noted. Psych: Normal affect.   LAB RESULTS:  No visits with results within 3 Day(s) from this visit.  Latest known visit with results is:  Admission on 12/21/2016, Discharged on 12/23/2016  Component Date Value Ref Range Status  . WBC 12/21/2016 5.3  3.6 - 11.0 K/uL Final  . RBC 12/21/2016 2.21* 3.80 - 5.20 MIL/uL Final  . Hemoglobin 12/21/2016 6.9* 12.0 - 16.0 g/dL Final  . HCT 12/21/2016 21.3* 35.0 - 47.0 % Final  . MCV 12/21/2016 96.6  80.0 - 100.0 fL Final  . MCH 12/21/2016 31.4  26.0 - 34.0 pg Final  . MCHC 12/21/2016 32.5  32.0 - 36.0 g/dL Final  . RDW 12/21/2016 14.8* 11.5 - 14.5 % Final  . Platelets 12/21/2016 166  150 - 440 K/uL Final  . Neutrophils Relative % 12/21/2016 71  % Final  . Neutro Abs 12/21/2016 3.8  1.4 - 6.5 K/uL Final  . Lymphocytes Relative 12/21/2016 20  % Final  . Lymphs Abs 12/21/2016 1.0  1.0 -  3.6 K/uL Final  . Monocytes Relative 12/21/2016 7  % Final  . Monocytes Absolute 12/21/2016 0.4  0.2 - 0.9 K/uL Final  . Eosinophils Relative 12/21/2016 1  % Final  . Eosinophils Absolute 12/21/2016 0.0  0 - 0.7 K/uL Final  . Basophils Relative 12/21/2016 1  % Final  . Basophils Absolute 12/21/2016 0.1  0 - 0.1 K/uL Final   Performed at [facility], [address] [place], [place] [address]  . Sodium 12/21/2016 138  135 - 145 mmol/L Final  . Potassium 12/21/2016 3.8  3.5 - 5.1 mmol/L Final  . Chloride 12/21/2016 108  101 - 111 mmol/L Final  . CO2 12/21/2016 23  22 - 32 mmol/L Final  . Glucose, Bld 12/21/2016 91  65 - 99 mg/dL Final  . BUN 12/21/2016 40* 6 - 20 mg/dL Final  . Creatinine, Ser 12/21/2016 0.75  0.44 - 1.00 mg/dL Final  . Calcium 12/21/2016 8.6* 8.9 - 10.3 mg/dL Final  . Total Protein 12/21/2016 6.2* 6.5 - 8.1 g/dL Final  . Albumin 12/21/2016 4.1  3.5 - 5.0  g/dL Final  . AST 12/21/2016 27  15 - 41 U/L Final  . ALT 12/21/2016 15  14 - 54 U/L Final  . Alkaline Phosphatase 12/21/2016 57  38 - 126 U/L Final  . Total Bilirubin 12/21/2016 0.4  0.3 - 1.2 mg/dL Final  . GFR calc non Af Amer 12/21/2016 >60  >60 mL/min Final  . GFR calc Af Amer 12/21/2016 >60  >60 mL/min Final   Comment: (NOTE) The eGFR has been calculated using the CKD EPI equation. This calculation has not been validated in all clinical situations. eGFR's persistently <60 mL/min signify possible Chronic Kidney Disease.   Georgiann Hahn gap 12/21/2016 7  5 - 15 Final   Performed at [facility], [address] [place], [place] [address]  . ABO/RH(D) 12/21/2016 A POS   Final  . Antibody Screen 12/21/2016 NEG   Final  . Sample Expiration 12/21/2016 12/24/2016   Final  . Unit Number 12/21/2016 [id number]   Final  . Blood Component Type 12/21/2016 RED CELLS,LR   Final  . Unit division 12/21/2016 00   Final  . Status of Unit 12/21/2016 ISSUED,FINAL   Final  . Transfusion Status 12/21/2016 OK TO TRANSFUSE   Final  . Crossmatch Result 12/21/2016 Compatible   Final  . Unit Number 12/21/2016 [id number]   Final  . Blood Component Type 12/21/2016 RED CELLS,LR   Final  . Unit division 12/21/2016 00   Final  . Status of Unit 12/21/2016 ISSUED,FINAL   Final  . Transfusion Status 12/21/2016 OK TO TRANSFUSE   Final  . Crossmatch Result 12/21/2016    Final                   Value:Compatible Performed at [facility], [address] [place], [place] [address]   . Order Confirmation 12/21/2016    Final                   Value:ORDER PROCESSED BY BLOOD BANK Performed at [facility], [address] [place], [place] [address]   . ISSUE DATE / TIME 12/21/2016 [id number]   Final  . Blood Product Unit Number 12/21/2016 [id number]   Final  . PRODUCT CODE 12/21/2016 [id number]   Final  . Unit Type and Rh 12/21/2016 6200   Final  . Blood Product Expiration Date  12/21/2016 [id number]   Final  . ISSUE  DATE / TIME 12/21/2016 [id number]   Final  . Blood Product Unit Number 12/21/2016 [id number]   Final  . PRODUCT CODE 12/21/2016 [id number]   Final  . Unit Type and Rh 12/21/2016 0600   Final  . Blood Product Expiration Date 12/21/2016 [id number]   Final  . Sodium 12/22/2016 141  135 - 145 mmol/L Final  . Potassium 12/22/2016 4.0  3.5 - 5.1 mmol/L Final  . Chloride 12/22/2016 115* 101 - 111 mmol/L Final  . CO2 12/22/2016 22  22 - 32 mmol/L Final  . Glucose, Bld 12/22/2016 87  65 - 99 mg/dL Final  . BUN 12/22/2016 28* 6 - 20 mg/dL Final  . Creatinine, Ser 12/22/2016 0.62  0.44 - 1.00 mg/dL Final  . Calcium 12/22/2016 8.3* 8.9 - 10.3 mg/dL Final  . GFR calc non Af Amer 12/22/2016 >60  >60 mL/min Final  . GFR calc Af Amer 12/22/2016 >60  >60 mL/min Final   Comment: (NOTE) The eGFR has been calculated using the CKD EPI equation. This calculation has not been validated in all clinical situations. eGFR's persistently <60 mL/min signify possible Chronic Kidney Disease.   Georgiann Hahn gap 12/22/2016 4* 5 - 15 Final   Performed at [facility], [address] [place], [place] [address]  . WBC 12/22/2016 5.9  3.6 - 11.0 K/uL Final  . RBC 12/22/2016 2.46* 3.80 - 5.20 MIL/uL Final  . Hemoglobin 12/22/2016 7.7* 12.0 - 16.0 g/dL Final  . HCT 12/22/2016 22.7* 35.0 - 47.0 % Final  . MCV 12/22/2016 92.2  80.0 - 100.0 fL Final  . MCH 12/22/2016 31.2  26.0 - 34.0 pg Final  . MCHC 12/22/2016 33.9  32.0 - 36.0 g/dL Final  . RDW 12/22/2016 16.7* 11.5 - 14.5 % Final  . Platelets 12/22/2016 131* 150 - 440 K/uL Final   Performed at [facility], [address] [place], [place] [address]  . Order Confirmation 12/22/2016    Final                   Value:ORDER PROCESSED BY BLOOD BANK Performed at [facility], [address] [place], [place] [address]   . WBC 12/23/2016 4.3  3.6 - 11.0 K/uL Final  . RBC 12/23/2016 3.20* 3.80 - 5.20  MIL/uL Final  . Hemoglobin 12/23/2016 9.5* 12.0 - 16.0 g/dL Final  . HCT 12/23/2016 29.0* 35.0 - 47.0 % Final  . MCV 12/23/2016 90.6  80.0 - 100.0 fL Final  . MCH 12/23/2016 29.6  26.0 - 34.0 pg Final  . MCHC 12/23/2016 32.7  32.0 - 36.0 g/dL Final  . RDW 12/23/2016 17.8* 11.5 - 14.5 % Final  . Platelets 12/23/2016 147* 150 - 440 K/uL Final   Performed at [facility], [address], [place],  [address]  . Sodium 12/23/2016 141  135 - 145 mmol/L Final  . Potassium 12/23/2016 3.7  3.5 - 5.1 mmol/L Final  . Chloride 12/23/2016 112* 101 - 111 mmol/L Final  . CO2 12/23/2016 22  22 - 32 mmol/L Final  . Glucose, Bld 12/23/2016 84  65 - 99 mg/dL Final  . BUN 12/23/2016 17  6 - 20 mg/dL Final  . Creatinine, Ser 12/23/2016 0.69  0.44 - 1.00 mg/dL Final  . Calcium 12/23/2016 8.6* 8.9 - 10.3 mg/dL Final  . GFR calc non Af Amer 12/23/2016 >60  >60 mL/min Final  . GFR calc Af Amer 12/23/2016 >60  >60 mL/min Final   Comment: (NOTE) The  eGFR has been calculated using the CKD EPI equation. This calculation has not been validated in all clinical situations. eGFR's persistently <60 mL/min signify possible Chronic Kidney Disease.   Georgiann Hahn gap 12/23/2016 7  5 - 15 Final   Performed at [facility] [place] [facility], [place], [place] [address]   Lab Results  Component Value Date   IRON 180 (H) 10/27/2016   TIBC 259 10/27/2016   IRONPCTSAT 70 (H) 10/27/2016   Lab Results  Component Value Date   FERRITIN 32 10/27/2016    STUDIES: No results found.  ASSESSMENT: Iron deficiency anemia.  PLAN:    1. Iron deficiency anemia: Patient has a recent history of dark tarry stools and required several units of packed red blood cells.  Her hemoglobin is significantly improved to 10.8 today.  Her iron stores are within normal limits.  She does not require a blood transfusion or IV iron today.  No intervention is needed.  Patient states she has multiple physicians appointments over  the next several weeks and wishes to delay further follow-up until January 2019.   2. GI bleed: No obvious source identified.  Continue close follow-up with GI as indicated.  Approximately 30 minutes was spent in discussion of which greater than 50% was consultation.  Patient expressed understanding and was in agreement with this plan. She also understands that She can call clinic at any time with any questions, concerns, or complaints.    Lloyd Huger, MD 01/13/17 2:40 PM

## 2017-01-16 ENCOUNTER — Inpatient Hospital Stay: Payer: Medicare Other

## 2017-01-16 ENCOUNTER — Inpatient Hospital Stay: Payer: Medicare Other | Admitting: Oncology

## 2017-01-29 ENCOUNTER — Ambulatory Visit (HOSPITAL_COMMUNITY)
Admission: RE | Admit: 2017-01-29 | Discharge: 2017-01-29 | Disposition: A | Discharge status: Home / Self Care | Payer: Medicare Other | Source: Ambulatory Visit | Attending: Cardiovascular Disease | Admitting: Cardiovascular Disease

## 2017-01-29 ENCOUNTER — Other Ambulatory Visit: Payer: Self-pay

## 2017-01-29 ENCOUNTER — Ambulatory Visit: Payer: Medicare Other | Attending: Cardiovascular Disease | Admitting: Physical Therapy

## 2017-01-29 ENCOUNTER — Encounter: Payer: Self-pay | Admitting: Physical Therapy

## 2017-01-29 DIAGNOSIS — J449 Chronic obstructive pulmonary disease, unspecified: Secondary | ICD-10-CM | POA: Insufficient documentation

## 2017-01-29 DIAGNOSIS — I350 Nonrheumatic aortic (valve) stenosis: Secondary | ICD-10-CM

## 2017-01-29 DIAGNOSIS — I700 Atherosclerosis of aorta: Secondary | ICD-10-CM | POA: Insufficient documentation

## 2017-01-29 DIAGNOSIS — R2689 Other abnormalities of gait and mobility: Secondary | ICD-10-CM | POA: Diagnosis not present

## 2017-01-29 DIAGNOSIS — M6281 Muscle weakness (generalized): Secondary | ICD-10-CM | POA: Diagnosis present

## 2017-01-29 DIAGNOSIS — I517 Cardiomegaly: Secondary | ICD-10-CM | POA: Insufficient documentation

## 2017-01-29 LAB — PULMONARY FUNCTION TEST
DL/VA % PRED: 67 %
DL/VA: 3.15 ml/min/mmHg/L
DLCO unc % pred: 47 %
DLCO unc: 10.93 ml/min/mmHg
FEF 25-75 Post: 1.12 L/sec
FEF 25-75 Pre: 1.08 L/sec
FEF2575-%CHANGE-POST: 4 %
FEF2575-%PRED-POST: 100 %
FEF2575-%Pred-Pre: 96 %
FEV1-%CHANGE-POST: 0 %
FEV1-%PRED-PRE: 88 %
FEV1-%Pred-Post: 88 %
FEV1-POST: 1.5 L
FEV1-PRE: 1.49 L
FEV1FVC-%CHANGE-POST: 0 %
FEV1FVC-%PRED-PRE: 95 %
FEV6-%Change-Post: 1 %
FEV6-%Pred-Post: 100 %
FEV6-%Pred-Pre: 99 %
FEV6-POST: 2.16 L
FEV6-Pre: 2.14 L
FEV6FVC-%PRED-POST: 106 %
FEV6FVC-%Pred-Pre: 106 %
FVC-%CHANGE-POST: 1 %
FVC-%PRED-POST: 94 %
FVC-%PRED-PRE: 93 %
FVC-POST: 2.16 L
FVC-PRE: 2.14 L
POST FEV1/FVC RATIO: 69 %
PRE FEV1/FVC RATIO: 70 %
PRE FEV6/FVC RATIO: 100 %
Post FEV6/FVC ratio: 100 %
RV % pred: 81 %
RV: 1.98 L
TLC % pred: 84 %
TLC: 4.12 L

## 2017-01-29 MED ORDER — IOPAMIDOL (ISOVUE-370) INJECTION 76%
INTRAVENOUS | Status: AC
  Administered 2017-01-29: 100 mL
  Filled 2017-01-29: qty 100

## 2017-01-29 MED ORDER — ALBUTEROL SULFATE (2.5 MG/3ML) 0.083% IN NEBU
2.5000 mg | INHALATION_SOLUTION | Freq: Once | RESPIRATORY_TRACT | Status: AC
  Administered 2017-01-29: 2.5 mg via RESPIRATORY_TRACT

## 2017-01-29 NOTE — Therapy (Signed)
[facility] [address] [place], [place], [address] Phone: [phone]   Fax:  [phone]  Physical Therapy Evaluation  Patient Details  Name: Kelsey SextonLouise C Le MRN: [id number] Date of Birth: 05-27-1930 Referring Provider: Dr. Verne Carrowhristopher McAlhany   Encounter Date: 01/29/2017  PT End of Session - 01/29/17 1348    Visit Number  1    PT Start Time  1200    PT Stop Time  1245    PT Time Calculation (min)  45 min    Equipment Utilized During Treatment  Gait belt       Past Medical History:  Diagnosis Date  . Aortic stenosis    a. TTE 8/17: nl EF, mod to sev AS w/ mean gradient 28 mmHg, valve area 0.97, mod pulm HTN, small pericardial effusion  . Arrhythmia   . Asthma   . AVM (arteriovenous malformation)    a. small intestine   . Broken back   . Broken wrist   . Hip fracture (HCC)    a. in the setting of mechanical fall  . Iron deficiency anemia    a. requiring periodic transfusions  . Syncope and collapse     Past Surgical History:  Procedure Laterality Date  . APPENDECTOMY    . BACK SURGERY     back fusion  . ESOPHAGOGASTRODUODENOSCOPY (EGD) WITH PROPOFOL N/A 11/15/2015   Procedure: ESOPHAGOGASTRODUODENOSCOPY (EGD) WITH PROPOFOL;  Surgeon: Midge Miniumarren Wohl, MD;  Location: [facility] ENDOSCOPY;  Service: Endoscopy;  Laterality: N/A;  . ESOPHAGOGASTRODUODENOSCOPY (EGD) WITH PROPOFOL N/A 10/12/2016   Procedure: ESOPHAGOGASTRODUODENOSCOPY (EGD) WITH PROPOFOL;  Surgeon: Midge MiniumWohl, Darren, MD;  Location: [facility] ENDOSCOPY;  Service: Endoscopy;  Laterality: N/A;  . FRACTURE SURGERY    . GIVENS CAPSULE STUDY N/A 02/20/2016   Procedure: GIVENS CAPSULE STUDY;  Surgeon: Scot Junobert T Elliott, MD;  [facility];  Service: Endoscopy;  Laterality: N/A;  . HIP FRACTURE SURGERY    . INTRAMEDULLARY (IM) NAIL INTERTROCHANTERIC Right 11/18/2015   Procedure: INTRAMEDULLARY (IM) NAIL INTERTROCHANTRIC;  Surgeon: Juanell FairlyKevin Krasinski, MD;  Location: [facility]  ORS;  Service: Orthopedics;  Laterality: Right;  . KYPHOPLASTY N/A 08/19/2014   Procedure: KYPHOPLASTY;  Surgeon: Kennedy BuckerMichael Menz, MD;  Location: [facility] ORS;  Service: Orthopedics;  Laterality: N/A;  . PARTIAL HYSTERECTOMY    . RIGHT/LEFT HEART CATH AND CORONARY ANGIOGRAPHY N/A 10/05/2016   Procedure: RIGHT/LEFT HEART CATH AND CORONARY ANGIOGRAPHY;  Surgeon: Kathleene HazelMcAlhany, Christopher D, MD;  [facility] LAB;  Service: Cardiovascular;  Laterality: N/A;  . TUBAL LIGATION      There were no vitals filed for this visit.   Subjective Assessment - 01/29/17 1205    Subjective  reports shortness of breath with activity, dizziness and fatigue which has been worsening over the last year and a half    Patient Stated Goals  to fix heart    Currently in Pain?  Yes had shots Friday for pain    Pain Location  Hip    Pain Orientation  Right    Pain Descriptors / Indicators  Aching    Pain Type  Chronic pain    Aggravating Factors   making bed, standing    Pain Relieving Factors  rest Will monitor but no goal to follow         [facility] PT Assessment - 01/29/17 0001      Assessment   Medical Diagnosis  severe aortic stenosis    Referring Provider  Dr. Verne Carrowhristopher McAlhany    Onset Date/Surgical Date  01/09/17      Precautions   Precautions  Fall      Restrictions   Weight Bearing Restrictions  No      Balance Screen   Has the patient fallen in the past 6 months  No    Has the patient had a decrease in activity level because of a fear of falling?   No    Is the patient reluctant to leave their home because of a fear of falling?   No      Home Public house manager residence    Living Arrangements  Alone    Home Access  Elevator    Home Layout  One level      Prior Function   Level of Independence  Independent with community mobility without device      Posture/Postural Control   Posture/Postural Control  Postural limitations    Postural Limitations  Forward  head;Rounded Shoulders      ROM / Strength   AROM / PROM / Strength  AROM;Strength      AROM   Overall AROM Comments  grossly WNL      Strength   Overall Strength Comments  grossly 4 to 5/5 throughout except hips bil 3/5    Right Hand Grip (lbs)  31 R hand dominant    Left Hand Grip (lbs)  40      Ambulation/Gait   Gait Comments  Pt ambulates with bil. toe in and a narrow BOS using a front wheeled RW. Gait distance is limited by 47% for age/gender.        [facility] Pre-Surgical Assessment - 01/29/17 0001    5 Meter Walk Test- trial 1  8 sec    5 Meter Walk Test- trial 2  8 sec.     5 Meter Walk Test- trial 3  7 sec.    5 meter walk test average  7.67 sec    4 Stage Balance Test tolerated for:   10 sec.    4 Stage Balance Test Position  2    Comment  unable without UE support    ADL/IADL Independent with:  Bathing;Dressing;Meal prep;Finances    ADL/IADL Needs Assistance with:  Pincus Badder work    ADL/IADL Fraility Index  Vulnerable    6 Minute Walk- Baseline  yes    BP (mmHg)  130/82    HR (bpm)  71    02 Sat (%RA)  99 %    Modified Borg Scale for Dyspnea  2- Mild shortness of breath    Perceived Rate of Exertion (Borg)  6-    6 Minute Walk Post Test  yes    BP (mmHg)  148/58    HR (bpm)  102    02 Sat (%RA)  92 %    Modified Borg Scale for Dyspnea  3- Moderate shortness of breath or breathing difficulty    Perceived Rate of Exertion (Borg)  13- Somewhat hard    Aerobic Endurance Distance Walked  685           Objective measurements completed on examination: See above findings.              PT Education - 01/29/17 1348    Education Details  fall risk, use of RW, [facility] option for purchasing a new walker    Person(s) Educated  Patient;Child(ren)    Methods  Explanation;Handout    Comprehension  Verbalized understanding  Plan - 01/29/17 1348    Clinical Impression Statement  see below    PT Frequency  One time visit       Clinical Impression Statement: Pt is an 84 yo female presenting to OP PT for evaluation prior to possible TAVR surgery due to severe aortic stenosis. Pt reports onset of shortness of breath, fatigue and dizziness approximately 18 months ago and have been worsening since that time. Pt also has a history of GI bleeds for which she is undergoing medical management. Pt presents with good ROM and strength with the exception of bil hips with hx of bil. Hip fx, poor balance and is at high fall risk 4 stage balance test, slow walking speed and poor aerobic endurance per 6 minute walk test. Pt ambulated a total of 685 feet in 6 minute walk using a front wheeled RW. Based on the Short Physical Performance Battery, patient has a frailty rating of 4/12 with </= 5/12 considered frail.   Patient demonstrated the following deficits and impairments:     Visit Diagnosis: Other abnormalities of gait and mobility  Muscle weakness (generalized)     Problem List Patient Active Problem List   Diagnosis Date Noted  . Blood in stool   . Gastritis without bleeding   . Anemia 10/11/2016  . Malnutrition of moderate degree 10/11/2016  . UGIB (upper gastrointestinal bleed)   . GIB (gastrointestinal bleeding) 10/10/2016  . Iron deficiency anemia 01/15/2016  . Hip fracture (HCC) 11/17/2015  . Acute posthemorrhagic anemia 11/16/2015  . H/O transfusion of packed red blood cells 11/16/2015  . Elevated troponin 11/16/2015  . Severe aortic stenosis 11/16/2015  . Thrombocytopenia (HCC) 11/16/2015  . Generalized weakness 11/16/2015  . Melena 11/14/2015  . Lichen sclerosus 10/31/2015  . Constipation 10/24/2015  . DDD (degenerative disc disease), lumbar 11/02/2014  . Mild intermittent asthma without complication 03/05/2014  . GI bleed 03/01/2014  . Lumbar radiculitis 12/16/2013  . Aortic stenosis 07/27/2013  . Osteoarthritis of hip 07/06/2013  . Trochanteric bursitis 07/06/2013  . Heart murmur 04/26/2013  . SOB  (shortness of breath) 04/26/2013  . Elevated blood pressure reading without diagnosis of hypertension 04/26/2013  . Left wrist fracture 12/05/2008    Rheda Kassab, PT 01/29/2017, 1:54 PM  [facility] [address] [place], [place], [address] Phone: [phone]   Fax:  [phone]  Name: AMZIE SILLAS MRN: [id number] Date of Birth: 07-Aug-1930

## 2017-01-29 NOTE — Patient Instructions (Signed)
[facility]  [address]        Phone: [phone] [place] [address]       Ext# [phone] Customer Care  Office Hours M-F, 9am to 5pm      Fax: [phone]     Private Pay Prices  Rolling Walker with Seat (Rollator) $127 Standard Walker   $42 Rolling Walker    $52 Single Point Cane   $12 Small Base Quad Cane    $18 Large Base Quad Cane   $20 Axillary Crutches   $19 Moist Heat Pack    Various Prices/ sizes 

## 2017-01-30 ENCOUNTER — Institutional Professional Consult (permissible substitution) (INDEPENDENT_AMBULATORY_CARE_PROVIDER_SITE_OTHER): Payer: Medicare Other | Admitting: Surgery

## 2017-01-30 ENCOUNTER — Encounter: Payer: Self-pay | Admitting: Surgery

## 2017-01-30 VITALS — BP 157/67 | HR 87 | Resp 20 | Ht 65.0 in | Wt 110.0 lb

## 2017-01-30 DIAGNOSIS — I350 Nonrheumatic aortic (valve) stenosis: Secondary | ICD-10-CM

## 2017-01-30 NOTE — Progress Notes (Signed)
Patient ID: Kelsey Le, female   DOB: [birth year], 84 y.o.   MRN: [id number]  [facility]  MULTIDISCIPLINARY HEART VALVE CLINIC  CARDIOTHORACIC SURGERY CONSULTATION REPORT  Referring Provider is Iran OuchArida, Muhammad A, MD PCP is Judithann SheenSparks, Duane LopeJeffrey D, MD  Chief Complaint  Patient presents with  . Aortic Stenosis    Surgical eval for TAVR, review all studies    HPI:  The patient is an 84 year old woman with a history of iron deficiency anemia, a several year history of GI bleeding due to AVMs, osteoporosis with multiple fractures from falls, and severe aortic stenosis who has been referred for consideration of aortic valve replacement.  She has been followed by Dr. Kirke CorinArida for aortic stenosis with periodic echocardiograms.  Her echocardiogram on 08/12/2015 showed moderate aortic stenosis with a mean gradient of 28 mmHg and normal left ventricular systolic function.  A follow-up echocardiogram on 08/28/2016 showed progression to severe aortic stenosis with a mean gradient of 65 mm peak gradient of 106 mmHg.  She was [facility] with headache and dizziness.  A CTA of the head showed a small (2 mm) intracranial aneurysm and mild carotid disease.  She was seen by neurosurgery and no further workup was recommended.  She also was noted to have a 10 mm nodular lesion in the vallecula which was evaluated by ENT and no further workup was recommended.  She was having progressive shortness of breath and fatigue with generalized weakness at this time and was referred to Dr. Clifton JamesMcAlhany for consideration of TAVR.  She underwent cardiac catheterization on 10/05/2016 which showed no evidence of coronary artery disease.  There was severe aortic stenosis with a mean gradient of 42 mmHg, a peak to peak gradient of 52 mmHg, and a calculated aortic valve area of 0.69 cm.  She was started on aspirin 81 mg daily after the catheterization with her history of AVMs and bleeding  to see if she would tolerate that.  Unfortunately she was [facility] on 10/12/1998 1:18 week of aspirin therapy with weakness and melena with a drop in her hemoglobin from 11.9-6.1.  The aspirin was stopped.  She underwent EGD on 10/12/2016 showing gastritis only.  Gastroenterology thought that her bleeding was most likely from her AVMs.  She was transfused 3 units of PRBCs and her hemoglobin increased to 10 prior to discharge.  Her melena resolved off of aspirin but she was readmitted on 10/26/2016 with recurrent GI bleeding with a hemoglobin of 7.0.  She was transfused and had upper endoscopy and was subsequently treated for H. pylori.  Her bleeding was again felt to be due to AVMs.  Then [facility] was [facility] with recurrent anemia and GI bleeding with a hemoglobin of 6.9.  She was transfused and discharged home.  She had had a capsule study in May 2018 that reportedly showed obvious AVMs.  Since her admission in December 2018 she has not had any further obvious GI bleeding and no melena.  According to her son and daughter who are here with her today her hemoglobin has been stable around 11 on the last 3 checks.  She continues to have exertional shortness of breath and fatigue.  She said that she gets short of breath walking around her house unless she moves slowly.  She has shortness of breath when bending over to lift up anything.  She denies any chest pain or pressure.  She has not  had any recent dizziness.  She denies peripheral edema.  She denies orthopnea and PND.  She is divorced and lives alone in an apartment in [place].  She is able to take care of herself said that she does not have enough energy to clean her house.  She has a small dog that she still walks.  She has a son and daughter who keep a close eye on her and are very devoted.  She uses a walker to help her with balance since she has had multiple falls in the past.  She has had both hips replaced due to falls.  She  has had fractures of both wrist due to falls.  She has had vertebral compression fractures related to activity.  Past Medical History:  Diagnosis Date  . Aortic stenosis    a. TTE 8/17: nl EF, mod to sev AS w/ mean gradient 28 mmHg, valve area 0.97, mod pulm HTN, small pericardial effusion  . Arrhythmia   . Asthma   . AVM (arteriovenous malformation)    a. small intestine   . Broken back   . Broken wrist   . Hip fracture (HCC)    a. in the setting of mechanical fall  . Iron deficiency anemia    a. requiring periodic transfusions  . Syncope and collapse     Past Surgical History:  Procedure Laterality Date  . APPENDECTOMY    . BACK SURGERY     back fusion  . ESOPHAGOGASTRODUODENOSCOPY (EGD) WITH PROPOFOL N/A 11/15/2015   Procedure: ESOPHAGOGASTRODUODENOSCOPY (EGD) WITH PROPOFOL;  Surgeon: [name], MD;  Location: [facility] ENDOSCOPY;  Service: Endoscopy;  Laterality: N/A;  . ESOPHAGOGASTRODUODENOSCOPY (EGD) WITH PROPOFOL N/A 10/12/2016   Procedure: ESOPHAGOGASTRODUODENOSCOPY (EGD) WITH PROPOFOL;  Surgeon: [name], MD;  Location: [facility] ENDOSCOPY;  Service: Endoscopy;  Laterality: N/A;  . FRACTURE SURGERY    . GIVENS CAPSULE STUDY N/A 02/20/2016   Procedure: GIVENS CAPSULE STUDY;  Surgeon: Scot Jun, MD;  Location: [facility] ENDOSCOPY;  Service: Endoscopy;  Laterality: N/A;  . HIP FRACTURE SURGERY    . INTRAMEDULLARY (IM) NAIL INTERTROCHANTERIC Right 11/18/2015   Procedure: INTRAMEDULLARY (IM) NAIL INTERTROCHANTRIC;  Surgeon: Juanell Fairly, MD;  Location: [facility] ORS;  Service: Orthopedics;  Laterality: Right;  . KYPHOPLASTY N/A 08/19/2014   Procedure: KYPHOPLASTY;  Surgeon: Kennedy Bucker, MD;  Location: [facility] ORS;  Service: Orthopedics;  Laterality: N/A;  . PARTIAL HYSTERECTOMY    . RIGHT/LEFT HEART CATH AND CORONARY ANGIOGRAPHY N/A 10/05/2016   Procedure: RIGHT/LEFT HEART CATH AND CORONARY ANGIOGRAPHY;  Surgeon: Kathleene Hazel, MD;  Location: [facility] INVASIVE CV LAB;  Service:  Cardiovascular;  Laterality: N/A;  . TUBAL LIGATION      Family History  Problem Relation Age of Onset  . Heart block Mother   . Heart failure Father   . Heart disease Sister   . Breast cancer Maternal Aunt     Social History   Socioeconomic History  . Marital status: Divorced    Spouse name: Not on file  . Number of children: Not on file  . Years of education: Not on file  . Highest education level: Not on file  Social Needs  . Financial resource strain: Not on file  . Food insecurity - worry: Not on file  . Food insecurity - inability: Not on file  . Transportation needs - medical: Not on file  . Transportation needs - non-medical: Not on file  Occupational History  . Not on file  Tobacco Use  .  Smoking status: Never Smoker  . Smokeless tobacco: Never Used  Substance and Sexual Activity  . Alcohol use: No  . Drug use: No  . Sexual activity: Not on file  Other Topics Concern  . Not on file  Social History Narrative  . Not on file    Current Outpatient Medications  Medication Sig Dispense Refill  . acetaminophen (TYLENOL) 325 MG tablet Take 325 mg by mouth every 6 (six) hours as needed (for pain.).    Marland Kitchen cholecalciferol (VITAMIN D) 400 units TABS tablet Take 800 Units by mouth daily.     . vitamin B-12 (CYANOCOBALAMIN) 1000 MCG tablet Take 1,000 mcg by mouth daily.     No current facility-administered medications for this visit.     Allergies  Allergen Reactions  . Aspirin     GI bleeding   . Penicillins Rash and Other (See Comments)    Has patient had a PCN reaction causing immediate rash, facial/tongue/throat swelling, SOB or lightheadedness with hypotension: Yes Has patient had a PCN reaction causing severe rash involving mucus membranes or skin necrosis: No Has patient had a PCN reaction that required hospitalization: No Has patient had a PCN reaction occurring within the last 10 years: No If all of the above answers are "NO", then may proceed with  Cephalosporin use.       Review of Systems:   General:  normal appetite, decreased energy, no weight gain, has weight loss, no fever  Cardiac:  no chest pain with exertion, no chest pain at rest, has SOB with mild exertion, no resting SOB, no PND, no orthopnea, has palpitations, no arrhythmia, no atrial fibrillation, no LE edema, has had dizzy spells, no syncope  Respiratory:  exertional shortness of breath, no home oxygen, no productive cough, no dry cough, no bronchitis, no wheezing, no hemoptysis, has asthma, no pain with inspiration or cough, no sleep apnea, no CPAP at night  GI:   no difficulty swallowing, no reflux, has frequent heartburn, no hiatal hernia, no abdominal pain, no constipation, no diarrhea, no hematochezia, no hematemesis, has had melena but not recently  GU:   no dysuria,  no frequency, no urinary tract infection, no hematuria, no kidney stones, no kidney disease  Vascular:  no pain suggestive of claudication, no pain in feet, no leg cramps, no varicose veins, no DVT, no non-healing foot ulcer  Neuro:   no stroke, no TIA's, no seizures, has had headaches, no temporary blindness one eye,  no slurred speech, no peripheral neuropathy, no chronic pain, has instability of gait, no memory/cognitive dysfunction  Musculoskeletal: has arthritis, no joint swelling, no myalgias, some difficulty walking, reduced mobility   Skin:   no rash, no itching, no skin infections, no pressure sores or ulcerations  Psych:   no anxiety, no depression, no nervousness, no unusual recent stress  Eyes:   no blurry vision,  no floaters, no recent vision changes,  wears glasses or contacts  ENT:   has hearing loss, no loose or painful teeth, no dentures, last saw dentist 01/28/2017 for filling  Hematologic:  no easy bruising, no abnormal bleeding, no clotting disorder, no frequent epistaxis  Endocrine:  no diabetes, does not check CBG's at home        Physical Exam:   BP (!) 157/67   Pulse 87    Resp 20   Ht 5\' 5"  (1.651 m)   Wt 110 lb (49.9 kg)   SpO2 98% Comment: RA  BMI 18.30 kg/m  General:  Elderly but  well-appearing  HEENT:  Unremarkable, NCAT, PERLA, EOMI, oropharynx clear, teeth in good condition for her age.  Neck:   no JVD, left cervical bruit, no adenopathy or thyromegaly  Chest:   clear to auscultation, symmetrical breath sounds, no wheezes, no rhonchi   CV:   RRR, grade IV/VI crescendo/decrescendo murmur heard best at RSB,  no diastolic murmur  Abdomen:  soft, non-tender, no masses or organomegaly  Extremities:  warm, well-perfused, pulses palpable, no LE edema  Rectal/GU  Deferred  Neuro:   Grossly non-focal and symmetrical throughout  Skin:   Clean and dry, no rashes, no breakdown   Diagnostic Tests:  Result status: Final result                           *[facility]*                  [address]                        [place], [place] [address]                            [phone]  ------------------------------------------------------------------- Transthoracic Echocardiography  Patient:    Reghan, Thul MR #:       [id number] Study Date: 08/28/2016 Gender:     F Age:        84 Height:     165.1 cm Weight:     51.7 kg BSA:        1.53 m^2 Pt. Status: Room:   ATTENDING    Lavonne Chick, Ryan M  REFERRING    Sondra Barges  PERFORMING   [place], [place]  SONOGRAPHER  Quentin Ore, RVT, RDCS, RDMS  cc:  ------------------------------------------------------------------- LV EF: 60% -   65%  ------------------------------------------------------------------- History:   PMH:   Dyspnea and murmur.  Aortic valve disease.  Risk factors:  Nonsmoker.  ------------------------------------------------------------------- Study Conclusions  - Left ventricle: The cavity size was normal. There was mild   concentric hypertrophy. Systolic function was normal. The   estimated ejection fraction was in  the range of 60% to 65%. Wall   motion was normal; there were no regional wall motion   abnormalities. Doppler parameters are consistent with abnormal   left ventricular relaxation (grade 1 diastolic dysfunction). - Aortic valve: There was severe stenosis. Mean gradient (S): 65 mm   Hg. Valve area (VTI): 0.75 cm^2. - Mitral valve: Valve area by pressure half-time: 2.06 cm^2. - Pulmonary arteries: Systolic pressure was mildly increased. PA   peak pressure: 38 mm Hg (S).  Impressions:  - Aortic stenosis worsened since last year.  ------------------------------------------------------------------- Study data:  The previous study was not available, so comparison was made to the report of August 2017.  Study status:  Routine. Procedure:  Transthoracic echocardiography. Image quality was fair. The study was technically difficult, as a result of poor acoustic windows.  Study completion:  There were no complications. Transthoracic echocardiography.  M-mode, complete 2D, spectral Doppler, and color Doppler.  Birthdate:  Patient birthdate: May 27, 1930.  Age:  Patient is 84 yr old.  Sex:  Gender: female. BMI: 19 kg/m^2.  Blood pressure:     138/60  Patient status: Outpatient.  Study date:  Study date: 08/28/2016. Study time: 01:33 PM.  -------------------------------------------------------------------  -------------------------------------------------------------------  Left ventricle:  The cavity size was normal. There was mild concentric hypertrophy. Systolic function was normal. The estimated ejection fraction was in the range of 60% to 65%. Wall motion was normal; there were no regional wall motion abnormalities. Doppler parameters are consistent with abnormal left ventricular relaxation (grade 1 diastolic dysfunction).  ------------------------------------------------------------------- Aortic valve:   Trileaflet; mildly thickened, severely calcified leaflets.  Doppler:   There  was severe stenosis.   There was no regurgitation.    VTI ratio of LVOT to aortic valve: 0.27. Valve area (VTI): 0.75 cm^2. Indexed valve area (VTI): 0.49 cm^2/m^2. Mean velocity ratio of LVOT to aortic valve: 0.25. Valve area (Vmean): 0.71 cm^2. Indexed valve area (Vmean): 0.46 cm^2/m^2. Mean gradient (S): 65 mm Hg. Peak gradient (S): 106 mm Hg.  ------------------------------------------------------------------- Aorta:  Aortic root: The aortic root was normal in size.  ------------------------------------------------------------------- Mitral valve:   Structurally normal valve.   Mobility was not restricted.  Doppler:  Transvalvular velocity was within the normal range. There was no evidence for stenosis. There was no regurgitation.    Valve area by pressure half-time: 2.06 cm^2. Indexed valve area by pressure half-time: 1.34 cm^2/m^2.    Peak gradient (D): 4 mm Hg.  ------------------------------------------------------------------- Left atrium:  The atrium was normal in size.  ------------------------------------------------------------------- Right ventricle:  The cavity size was normal. Wall thickness was normal. Systolic function was normal.  ------------------------------------------------------------------- Pulmonic valve:    Doppler:  Transvalvular velocity was within the normal range. There was no evidence for stenosis.  ------------------------------------------------------------------- Tricuspid valve:   Structurally normal valve.    Doppler: Transvalvular velocity was within the normal range. There was mild regurgitation.  ------------------------------------------------------------------- Pulmonary artery:   The main pulmonary artery was normal-sized. Systolic pressure was mildly increased.  ------------------------------------------------------------------- Right atrium:  The atrium was normal in  size.  ------------------------------------------------------------------- Pericardium:  There was no pericardial effusion.  ------------------------------------------------------------------- Systemic veins: Inferior vena cava: Poorly visualized. The vessel was normal in size.  ------------------------------------------------------------------- Measurements   Left ventricle                           Value          Reference  LV ID, ED, PLAX chordal          (L)     32    mm       43 - 52  LV ID, ES, PLAX chordal          (L)     19    mm       23 - 38  LV fx shortening, PLAX chordal           41    %        >=29  LV PW thickness, ED                      11    mm       ----------  IVS/LV PW ratio, ED                      1              <=1.3  Stroke volume, 2D                        93    ml       ----------  Stroke volume/bsa, 2D  61    ml/m^2   ----------  LV e&', lateral                           10.3  cm/s     ----------  LV E/e&', lateral                         9.09           ----------  LV e&', medial                            5.87  cm/s     ----------  LV E/e&', medial                          15.95          ----------  LV e&', average                           8.09  cm/s     ----------  LV E/e&', average                         11.58          ----------    Ventricular septum                       Value          Reference  IVS thickness, ED                        11    mm       ----------    LVOT                                     Value          Reference  LVOT ID, S                               19    mm       ----------  LVOT area                                2.84  cm^2     ----------  LVOT ID                                  19    mm       ----------  LVOT mean velocity, S                    94.1  cm/s     ----------  LVOT VTI, S                              32.9  cm       ----------  Stroke volume (SV), LVOT DP  93.3  ml        ----------  Stroke index (SV/bsa), LVOT DP           60.8  ml/m^2   ----------    Aortic valve                             Value          Reference  Aortic valve peak velocity, S            515   cm/s     ----------  Aortic valve mean velocity, S            378   cm/s     ----------  Aortic valve VTI, S                      124   cm       ----------  Aortic mean gradient, S                  65    mm Hg    ----------  Aortic peak gradient, S                  106   mm Hg    ----------  VTI ratio, LVOT/AV                       0.27           ----------  Aortic valve area, VTI                   0.75  cm^2     ----------  Aortic valve area/bsa, VTI               0.49  cm^2/m^2 ----------  Velocity ratio, mean, LVOT/AV            0.25           ----------  Aortic valve area, mean velocity         0.71  cm^2     ----------  Aortic valve area/bsa, mean              0.46  cm^2/m^2 ----------  velocity    Aorta                                    Value          Reference  Aortic root ID, ED                       26    mm       ----------  Ascending aorta ID, A-P, S               28    mm       ----------    Left atrium                              Value          Reference  LA ID, A-P, ES                           28    mm       ----------  LA ID/bsa, A-P                           1.83  cm/m^2   <=2.2  LA volume, ES, 1-p A4C                   31.4  ml       ----------  LA volume/bsa, ES, 1-p A4C               20.5  ml/m^2   ----------  LA volume, ES, 1-p A2C                   47.6  ml       ----------  LA volume/bsa, ES, 1-p A2C               31    ml/m^2   ----------    Mitral valve                             Value          Reference  Mitral E-wave peak velocity              93.6  cm/s     ----------  Mitral A-wave peak velocity              121   cm/s     ----------  Mitral deceleration time         (H)     366   ms       150 - 230  Mitral pressure half-time                107   ms        ----------  Mitral peak gradient, D                  4     mm Hg    ----------  Mitral E/A ratio, peak                   0.8            ----------  Mitral valve area, PHT, DP               2.06  cm^2     ----------  Mitral valve area/bsa, PHT, DP           1.34  cm^2/m^2 ----------    Pulmonary arteries                       Value          Reference  PA pressure, S, DP               (H)     38    mm Hg    <=30    Tricuspid valve                          Value          Reference  Tricuspid regurg peak velocity           286   cm/s     ----------  Tricuspid peak RV-RA gradient            33    mm Hg    ----------    Right atrium  Value          Reference  RA ID, S-I, ES, A4C                      40.9  mm       34 - 49  RA area, ES, A4C                         9.17  cm^2     8.3 - 19.5  RA volume, ES, A/L                       16.2  ml       ----------  RA volume/bsa, ES, A/L                   10.6  ml/m^2   ----------    Right ventricle                          Value          Reference  TAPSE                                    26.2  mm       ----------  RV s&', lateral, S                        12.7  cm/s     ----------    Pulmonic valve                           Value          Reference  Pulmonic valve peak velocity, S          123   cm/s     ----------  Legend: (L)  and  (H)  mark values outside specified reference range.  ------------------------------------------------------------------- Prepared and Electronically Authenticated by  Lorine Bears, MD 2018-08-28T18:16:44    Procedures   RIGHT/LEFT HEART CATH AND CORONARY ANGIOGRAPHY  Conclusion   1. No angiographic evidence of CAD 2. Severe aortic valve stenosis (mean gradient 42.4 mmHg, peak to peak gradient 52 mmHg, AVA 0.69 cm2).   Recommendations: Will continue with workup for TAVR  Indications   Severe aortic stenosis [I35.0 (ICD-10-CM)]  Procedural Details/Technique   Technical  Details Indication: 84 yo female with severe AS.  Procedure: The risks, benefits, complications, treatment options, and expected outcomes were discussed with the patient. The patient and/or family concurred with the proposed plan, giving informed consent. The patient was brought to the cath lab after IV hydration was given. The patient was further sedated with Versed. The right groin was prepped and draped. 1% lidocaine used for local anesthesia. 7 French sheath placed in there right femoral vein under u/s guidance. Right heart cath performed with balloon tipped catheter. The right wrist was prepped and draped in a sterile fashion. 1% lidocaine was used for local anesthesia. Using the modified Seldinger access technique, a 5 French sheath was placed in the right radial artery. 3 mg Verapamil was given through the sheath. 2500 units IV heparin was given. Standard diagnostic catheters were used to perform selective coronary angiography. I crossed the aortic valve with an AL-1 catheter and a straight wire. The sheath was  removed from the right radial artery and a Terumo hemostasis band was applied at the arteriotomy site on the right wrist.      Estimated blood loss <50 mL.  During this procedure the patient was administered the following to achieve and maintain moderate conscious sedation: Versed 1 mg, while the patient's heart rate, blood pressure, and oxygen saturation were continuously monitored. The period of conscious sedation was 52 minutes, of which I was present face-to-face 100% of this time.  Complications   Complications documented before study signed (10/05/2016 12:58 PM EDT)    RIGHT/LEFT HEART CATH AND CORONARY ANGIOGRAPHY   None Documented by Kathleene Hazel, MD 10/05/2016 12:36 PM EDT  Time Range: Intra-procedure      Coronary Findings   Diagnostic  Dominance: Right  Left Anterior Descending  Vessel is large. Vessel is angiographically normal.  First Diagonal Branch   Vessel is large in size.  First Septal Branch  Vessel is small in size.  Second Diagonal Branch  Vessel is small in size.  Second Septal Branch  Vessel is small in size.  Third Diagonal Branch  Vessel is small in size.  Third Septal Branch  Vessel is small in size.  Left Circumflex  Vessel is large. Vessel is angiographically normal.  Right Coronary Artery  Vessel is large. Vessel is angiographically normal.  Intervention   No interventions have been documented.  Left Heart   Aortic Valve There is severe aortic valve stenosis. The aortic valve is calcified.  Coronary Diagrams   Diagnostic Diagram       Implants     No implant documentation for this case.  MERGE Images   Show images for CARDIAC CATHETERIZATION   Link to Procedure Log   Procedure Log    Hemo Data    Most Recent Value  Fick Cardiac Output 5.54 L/min  Fick Cardiac Output Index 3.57 (L/min)/BSA  Aortic Mean Gradient 42.4 mmHg  Aortic Peak Gradient 52 mmHg  Aortic Valve Area 0.69  Aortic Value Area Index 0.45 cm2/BSA  RA A Wave 2 mmHg  RA V Wave 2 mmHg  RA Mean 1 mmHg  RV Systolic Pressure 23 mmHg  RV Diastolic Pressure 2 mmHg  RV EDP 4 mmHg  PA Systolic Pressure 22 mmHg  PA Diastolic Pressure 10 mmHg  PA Mean 15 mmHg  PW A Wave 13 mmHg  PW V Wave 13 mmHg  PW Mean 8 mmHg  AO Systolic Pressure 125 mmHg  AO Diastolic Pressure 54 mmHg  AO Mean 85 mmHg  LV Systolic Pressure 208 mmHg  LV Diastolic Pressure 5 mmHg  LV EDP 17 mmHg  Arterial Occlusion Pressure Extended Systolic Pressure 166 mmHg  Arterial Occlusion Pressure Extended Diastolic Pressure 75 mmHg  Arterial Occlusion Pressure Extended Mean Pressure 116 mmHg  Left Ventricular Apex Extended Systolic Pressure 218 mmHg  Left Ventricular Apex Extended Diastolic Pressure 7 mmHg  Left Ventricular Apex Extended EDP Pressure 17 mmHg  QP/QS 1  TPVR Index 4.2 HRUI  TSVR Index 23.79 HRUI  PVR SVR Ratio 0.08  TPVR/TSVR Ratio 0.18     ADDENDUM REPORT: 01/29/2017 13:58  CLINICAL DATA:  84 year old female with severe aortic stenosis being evaluated for a TAVR procedure.  EXAM: Cardiac TAVR CT  TECHNIQUE: The patient was scanned on a [employer]. A 120 kV retrospective scan was triggered in the descending thoracic aorta at 111 HU's. Gantry rotation speed was 250 msecs and collimation was .6 mm. No beta blockade or nitro were given.  The 3D data set was reconstructed in 5% intervals of the R-R cycle. Systolic and diastolic phases were analyzed on a dedicated work station using MPR, MIP and VRT modes. The patient received 80 cc of contrast.  FINDINGS: Aortic Valve: Severely thickened and calcified aortic valve with severely restricted leaflet opening and minimal calcifications extending into the LVOT under the non-coronary leaflet.  Aorta: Normal size, mild diffuse calcifications and no dissection.  Sinotubular Junction: 25 x 23 mm  Ascending Thoracic Aorta: 28 x 27 mm  Aortic Arch: 26 x 24 mm  Descending Thoracic Aorta: 22 x 22 mm  Sinus of Valsalva Measurements:  Non-coronary: 28 mm  Right -coronary: 25 mm  Left -coronary: 27 mm  Sinus of Valsalva Height:  Right -coronary: 28 mm  Left -coronary: 19 mm  Coronary Artery Height above Annulus:  Left Main: 10 mm  Right Coronary: 11 mm  Virtual Basal Annulus Measurements:  Maximum/Minimum Diameter: 24.4 x 19.7 mm  Mean Diameter: 22.0 mm  Perimeter: 71.4 mm  Area: 379 mm2  Optimum Fluoroscopic Angle for Delivery: LAO 19 CAU 17.  IMPRESSION: 1. Severely thickened and calcified aortic valve with severely restricted leaflet opening and minimal calcifications extending into the LVOT under the non-coronary leaflet. Annular measurements suitable for delivery of a 23 mm Edwards-SAPIEN 3 valve and a 26 mm Evolut Pro valve.  2. Sufficient coronary to annulus distance.  3. Optimum Fluoroscopic Angle  for Delivery:  LAO 19 CAU 17.  4. No thrombus in the left atrial appendage.   Electronically Signed   By: Tobias Alexander   On: 01/29/2017 13:58   Addended by Lars Masson, MD on 01/29/2017 2:00 PM    Study Result   EXAM: OVER-READ INTERPRETATION  CT CHEST  The following report is an over-read performed by radiologist Dr. Trudie Reed of [facility] on 01/29/2017. This over-read does not include interpretation of cardiac or coronary anatomy or pathology. The coronary calcium score/coronary CTA interpretation by the cardiologist is attached.  COMPARISON:  None.  FINDINGS: Extracardiac findings will be described under dictation for contemporaneously obtained CTA chest, abdomen and pelvis.  IMPRESSION: Please see separate dictation for contemporaneously obtained CTA chest, abdomen and pelvis for full description of relevant extracardiac findings.  Electronically Signed: By: Trudie Reed M.D. On: 01/29/2017 09:53       CLINICAL DATA:  84 year old female history of severe aortic valve stenosis. Preprocedural study prior to potential transcatheter aortic valve replacement (TAVR).  EXAM: CT ANGIOGRAPHY CHEST, ABDOMEN AND PELVIS  TECHNIQUE: Multidetector CT imaging through the chest, abdomen and pelvis was performed using the standard protocol during bolus administration of intravenous contrast. Multiplanar reconstructed images and MIPs were obtained and reviewed to evaluate the vascular anatomy.  CONTRAST:  95 mL of Isovue 370.  COMPARISON:  CT the abdomen and pelvis 01/29/2013.  FINDINGS: CTA CHEST FINDINGS  Cardiovascular: Heart size is mildly enlarged with left atrial dilatation. There is no significant pericardial fluid, thickening or pericardial calcification. Aortic atherosclerosis. Severe thickening calcification of the aortic valve.  Mediastinum/Lymph Nodes: No pathologically enlarged mediastinal or hilar  lymph nodes. Esophagus is unremarkable in appearance. No axillary lymphadenopathy.  Lungs/Pleura: Small calcified granuloma in the right upper lobe in an area of focal architectural distortion which likely reflects some mild post infectious or inflammatory scarring. No other suspicious appearing pulmonary nodules or masses. No acute consolidative airspace disease. No pleural effusions.  Musculoskeletal/Soft Tissues: Chronic compression fracture of T10 with post vertebroplasty changes and approximately 40% loss  of anterior vertebral body height. There are no aggressive appearing lytic or blastic lesions noted in the visualized portions of the skeleton.  CTA ABDOMEN AND PELVIS FINDINGS  Hepatobiliary: No suspicious cystic or solid hepatic lesions. No intra or extrahepatic biliary ductal dilatation. Gallbladder is nearly completely decompressed, but otherwise unremarkable in appearance.  Pancreas: No pancreatic mass. No pancreatic ductal dilatation. No pancreatic or peripancreatic fluid or inflammatory changes.  Spleen: Unremarkable.  Adrenals/Urinary Tract: Bilateral kidneys and left adrenal gland are normal in appearance. 11 x 8 mm right adrenal nodule, similar to prior study, at which point this was low-attenuation, most compatible with an adenoma. No hydroureteronephrosis. Urinary bladder is normal in appearance.  Stomach/Bowel: Normal appearance of the stomach. No pathologic dilatation of small bowel or colon. Numerous colonic diverticulae are noted, particularly in the sigmoid colon, without surrounding inflammatory changes to suggest an acute diverticulitis at this time. The appendix is not confidently identified and may be surgically absent. Regardless, there are no inflammatory changes noted adjacent to the cecum to suggest the presence of an acute appendicitis at this time.  Vascular/Lymphatic: Aortic atherosclerosis, without evidence of aneurysm or  dissection in the abdominal or pelvic vasculature. Vascular findings and measurements pertinent to potential TAVR procedure, as detailed below. Celiac axis, superior mesenteric artery, inferior mesenteric artery and single renal arteries bilaterally are all widely patent without hemodynamically significant stenosis. No lymphadenopathy noted in the abdomen or pelvis.  Reproductive: Prostate gland and seminal vesicles are unremarkable in appearance.  Other: No significant volume of ascites.  No pneumoperitoneum.  Musculoskeletal: Chronic compression fractures of L1 and L2 with approximately 30% loss of height at both levels and post vertebroplasty changes at L2. Orthopedic fixation hardware in the proximal femurs bilaterally. There are no aggressive appearing lytic or blastic lesions noted in the visualized portions of the skeleton.  VASCULAR MEASUREMENTS PERTINENT TO TAVR:  AORTA:  Minimal Aortic Diameter-11 x 11 mm  Severity of Aortic Calcification-severe  RIGHT PELVIS:  Right Common Iliac Artery -  Minimal Diameter-8.8 x 8.0 mm  Tortuosity-mild  Calcification-moderate  Right External Iliac Artery -  Minimal Diameter-6.5 x 6.3 mm  Tortuosity-moderate  Calcification-mild  Right Common Femoral Artery -  Minimal Diameter-7.4 x 6.8 mm  Tortuosity-mild  Calcification-none  LEFT PELVIS:  Left Common Iliac Artery -  Minimal Diameter-8.9 x 8.1 mm  Tortuosity-mild  Calcification-moderate  Left External Iliac Artery -  Minimal Diameter-6.7 x 6.5 mm  Tortuosity-moderate  Calcification-none  Left Common Femoral Artery -  Minimal Diameter-7.0 x 7.1 mm  Tortuosity-mild  Calcification-none  Review of the MIP images confirms the above findings.  IMPRESSION: 1. Vascular findings and measurements pertinent to potential TAVR procedure, as detailed above. 2. Severe thickening and calcification of the aortic  valve, compatible with the reported clinical history of severe aortic stenosis. 3. Mild cardiomegaly with mild left atrial dilatation. 4. Additional incidental findings, as above.  Aortic Atherosclerosis (ICD10-I70.0).   Electronically Signed   By: Trudie Reed M.D.   On: 01/29/2017 11:49   Impression:  This 84 year old woman has stage D, severe, symptomatic aortic stenosis with NYHA class II-III symptoms of exertional shortness of breath and fatigue consistent with chronic diastolic heart failure.  She has had episodes of dizziness in the past and multiple falls with fractures.  It is unclear if her dizziness was related to her aortic valve disease.  Her history is also been complicated by recurrent GI bleeding due to AV malformations.  She has not tolerated even low-dose aspirin  without recurrent GI bleeding.  I have personally reviewed her echocardiogram from August 2018, cardiac catheterization, and CTA studies.  Her echocardiogram shows a trileaflet aortic valve with severely calcified leaflets and restricted mobility.  Her mean gradient is 65 mmHg consistent with severe aortic stenosis.  Cardiac catheterization shows no coronary disease.  The mean gradient was measured at 42 mmHg consistent with severe aortic stenosis.  I think aortic valve replacement is indicated in this patient.  There is a possibility that her AVMs may be associated with her aortic stenosis and may improve with aortic valve replacement.  Her operative risk for open surgical aortic valve replacement would be at least moderate due to her advanced age, frailty score of 4/12 as noted in her physical therapy assessment, limited mobility requiring a walker, and recurrent GI bleeding on low-dose aspirin.  I think her operative risk would be significantly higher than that predicted by the STS risk score.  I think transcatheter aortic valve replacement would be the best treatment for her.  Her gated cardiac CT shows anatomy  favorable for TAVR using a Sapien 3 valve.  There is minimal calcification extending into the LVOT under the noncoronary leaflet but do not think this will significantly increase the risk of perivalvular leak.  Her abdominal and pelvic CTA shows adequate pelvic vascular anatomy to allow transfemoral insertion.  Her case has been discussed extensively by a multidisciplinary team of heart valve specialists and we felt that proceeding with transcatheter aortic valve replacement without the use of antiplatelet therapy postoperatively was a risk worth taking for this particular patient.  She would be at increased risk for valve thrombosis and this is been discussed extensively with the patient and her family and they understand this.  If she has recurrent GI bleeding postoperatively then she would be referred to [facility] for consideration of push enteroscopy and ablation of her AVMs.  The patient and her son and daughter were counseled at length regarding treatment alternatives for management of severe symptomatic aortic stenosis. The risks and benefits of surgical intervention has been discussed in detail. Long-term prognosis with medical therapy was discussed. Alternative approaches such as conventional surgical aortic valve replacement, transcatheter aortic valve replacement, and palliative medical therapy were compared and contrasted at length. This discussion was placed in the context of the patient's own specific clinical presentation and past medical history. All of their questions been addressed.   She still needs to have a second surgical evaluation but assuming that we proceed with transcatheter aortic valve replacement, a discussion was held regarding what types of management strategies would be attempted intraoperatively in the event of life-threatening complications, including whether or not the patient would be considered a candidate for the use of cardiopulmonary bypass and/or conversion to open sternotomy  for attempted surgical intervention.   The patient has been advised of a variety of complications that might develop including but not limited to risks of death, stroke, paravalvular leak, aortic dissection or other major vascular complications, aortic annulus rupture, device embolization, cardiac rupture or perforation, mitral regurgitation, acute myocardial infarction, arrhythmia, heart block or bradycardia requiring permanent pacemaker placement, congestive heart failure, respiratory failure, renal failure, pneumonia, infection, other late complications related to structural valve deterioration or migration, or other complications that might ultimately cause a temporary or permanent loss of functional independence or other long term morbidity. The patient provides full informed consent for the procedure as described and all questions were answered.      Plan:  She will  return tomorrow for a second surgical evaluation with Dr. Cornelius Moras and pending that evaluation she will be scheduled for transcatheter aortic valve replacement via transfemoral route on 02/12/2017.  I spent 60 minutes performing this consultation and > 50% of this time was spent face to face counseling and coordinating the care of this patient's severe symptomatic aortic stenosis.    Alleen Borne, MD 01/30/2017

## 2017-01-31 ENCOUNTER — Encounter: Payer: Medicare Other | Admitting: Thoracic Surgery (Cardiothoracic Vascular Surgery)

## 2017-02-04 ENCOUNTER — Other Ambulatory Visit: Payer: Self-pay

## 2017-02-04 DIAGNOSIS — I350 Nonrheumatic aortic (valve) stenosis: Secondary | ICD-10-CM

## 2017-02-05 NOTE — Pre-Procedure Instructions (Signed)
    Kelsey SextonLouise C Le  02/05/2017      [facility] [place] [address] [place] [id number] Phone: [phone] Fax: [phone]    Your procedure is scheduled on 02/12/17  [facility].M[facility]  Call this number if you have problems the morning of surgery:  [phone]   Remember:  Do not eat food or drink liquids after midnight.  Take these medicines the morning of surgery with A SIP OF WATER --tylenol   Do not wear jewelry, make-up or nail polish.  Do not wear lotions, powders, or perfumes, or deodorant.  Do not shave 48 hours prior to surgery.  Men may shave face and neck.  Do not bring valuables to the hospital.  [facility] not [facility] any belongings or valuables.  Contacts, dentures or bridgework may not be worn into surgery.  Leave your suitcase in the car.  After surgery it may be brought to your room.  For patients admitted to the hospital, discharge time will be determined by your treatment team.  Patients discharged the day of surgery will not be allowed to drive home.   Name and phone number of your driver:   Special instructions:  Do not take any aspirin,anti-inflammatories,vitamins,or herbal supplements 5-7 days prior to surgery.  Please read over the following fact sheets that you were given. MRSA Information

## 2017-02-06 ENCOUNTER — Institutional Professional Consult (permissible substitution) (INDEPENDENT_AMBULATORY_CARE_PROVIDER_SITE_OTHER): Payer: Medicare Other | Admitting: Thoracic Surgery (Cardiothoracic Vascular Surgery)

## 2017-02-06 ENCOUNTER — Encounter (HOSPITAL_COMMUNITY)
Admission: RE | Admit: 2017-02-06 | Discharge: 2017-02-06 | Disposition: A | Discharge status: Home / Self Care | Payer: Medicare Other | Source: Ambulatory Visit | Attending: Cardiovascular Disease | Admitting: Cardiovascular Disease

## 2017-02-06 ENCOUNTER — Ambulatory Visit (HOSPITAL_COMMUNITY)
Admission: RE | Admit: 2017-02-06 | Discharge: 2017-02-06 | Disposition: A | Discharge status: Home / Self Care | Payer: Medicare Other | Source: Ambulatory Visit | Attending: Cardiovascular Disease | Admitting: Cardiovascular Disease

## 2017-02-06 ENCOUNTER — Encounter: Payer: Self-pay | Admitting: Thoracic Surgery (Cardiothoracic Vascular Surgery)

## 2017-02-06 ENCOUNTER — Encounter (HOSPITAL_COMMUNITY): Payer: Self-pay

## 2017-02-06 VITALS — BP 137/64 | HR 88 | Resp 20 | Ht 61.0 in | Wt 110.0 lb

## 2017-02-06 DIAGNOSIS — Z01810 Encounter for preprocedural cardiovascular examination: Secondary | ICD-10-CM | POA: Insufficient documentation

## 2017-02-06 DIAGNOSIS — J45909 Unspecified asthma, uncomplicated: Secondary | ICD-10-CM | POA: Diagnosis not present

## 2017-02-06 DIAGNOSIS — I498 Other specified cardiac arrhythmias: Secondary | ICD-10-CM | POA: Insufficient documentation

## 2017-02-06 DIAGNOSIS — R011 Cardiac murmur, unspecified: Secondary | ICD-10-CM | POA: Diagnosis not present

## 2017-02-06 DIAGNOSIS — Z01812 Encounter for preprocedural laboratory examination: Secondary | ICD-10-CM | POA: Insufficient documentation

## 2017-02-06 DIAGNOSIS — I350 Nonrheumatic aortic (valve) stenosis: Secondary | ICD-10-CM | POA: Insufficient documentation

## 2017-02-06 DIAGNOSIS — D649 Anemia, unspecified: Secondary | ICD-10-CM | POA: Diagnosis not present

## 2017-02-06 DIAGNOSIS — I517 Cardiomegaly: Secondary | ICD-10-CM | POA: Diagnosis not present

## 2017-02-06 DIAGNOSIS — Z01818 Encounter for other preprocedural examination: Secondary | ICD-10-CM | POA: Insufficient documentation

## 2017-02-06 HISTORY — DX: Anxiety disorder, unspecified: F41.9

## 2017-02-06 LAB — HEMOGLOBIN A1C
HEMOGLOBIN A1C: 5 % (ref 4.8–5.6)
MEAN PLASMA GLUCOSE: 96.8 mg/dL

## 2017-02-06 LAB — APTT: APTT: 29 s (ref 24–36)

## 2017-02-06 LAB — URINALYSIS, ROUTINE W REFLEX MICROSCOPIC
BILIRUBIN URINE: NEGATIVE
Glucose, UA: NEGATIVE mg/dL
HGB URINE DIPSTICK: NEGATIVE
KETONES UR: 5 mg/dL — AB
Leukocytes, UA: NEGATIVE
Nitrite: NEGATIVE
PROTEIN: NEGATIVE mg/dL
Specific Gravity, Urine: 1.018 (ref 1.005–1.030)
pH: 5 (ref 5.0–8.0)

## 2017-02-06 LAB — COMPREHENSIVE METABOLIC PANEL
ALT: 21 U/L (ref 14–54)
AST: 29 U/L (ref 15–41)
Albumin: 3.9 g/dL (ref 3.5–5.0)
Alkaline Phosphatase: 61 U/L (ref 38–126)
Anion gap: 15 (ref 5–15)
BUN: 26 mg/dL — AB (ref 6–20)
CHLORIDE: 109 mmol/L (ref 101–111)
CO2: 15 mmol/L — ABNORMAL LOW (ref 22–32)
Calcium: 9 mg/dL (ref 8.9–10.3)
Creatinine, Ser: 0.71 mg/dL (ref 0.44–1.00)
GFR calc Af Amer: >60 mL/min (ref >60)
Glucose, Bld: 79 mg/dL (ref 65–99)
POTASSIUM: 3.9 mmol/L (ref 3.5–5.1)
Sodium: 139 mmol/L (ref 135–145)
Total Bilirubin: 0.9 mg/dL (ref 0.3–1.2)
Total Protein: 6.6 g/dL (ref 6.5–8.1)

## 2017-02-06 LAB — CBC
HCT: 32.8 % — ABNORMAL LOW (ref 36.0–46.0)
Hemoglobin: 10.3 g/dL — ABNORMAL LOW (ref 12.0–15.0)
MCH: 30.5 pg (ref 26.0–34.0)
MCHC: 31.4 g/dL (ref 30.0–36.0)
MCV: 97 fL (ref 78.0–100.0)
PLATELETS: 172 10*3/uL (ref 150–400)
RBC: 3.38 MIL/uL — ABNORMAL LOW (ref 3.87–5.11)
RDW: 15.9 % — AB (ref 11.5–15.5)
WBC: 5.6 10*3/uL (ref 4.0–10.5)

## 2017-02-06 LAB — ABO/RH: ABO/RH(D): A POS

## 2017-02-06 LAB — PROTIME-INR
INR: 0.91
Prothrombin Time: 12.2 seconds (ref 11.4–15.2)

## 2017-02-06 LAB — BLOOD GAS, ARTERIAL
ACID-BASE DEFICIT: 2.1 mmol/L — AB (ref 0.0–2.0)
Bicarbonate: 21.6 mmol/L (ref 20.0–28.0)
Drawn by: 470591
FIO2: 21
O2 SAT: 97.7 %
PATIENT TEMPERATURE: 98.6
PCO2 ART: 33.4 mmHg (ref 32.0–48.0)
pH, Arterial: 7.428 (ref 7.350–7.450)
pO2, Arterial: 94.3 mmHg (ref 83.0–108.0)

## 2017-02-06 LAB — SURGICAL PCR SCREEN
MRSA, PCR: INVALID — AB
Staphylococcus aureus: INVALID — AB

## 2017-02-06 LAB — BRAIN NATRIURETIC PEPTIDE: B NATRIURETIC PEPTIDE 5: 351.7 pg/mL — AB (ref 0.0–100.0)

## 2017-02-06 NOTE — Patient Instructions (Signed)
   Continue taking all current medications without change through the day before surgery.  Have nothing to eat or drink after midnight the night before surgery.  On the morning of surgery you do not need to take any medications

## 2017-02-06 NOTE — Progress Notes (Addendum)
[facility]  MULTIDISCIPLINARY HEART VALVE CLINIC  CARDIOTHORACIC SURGERY CONSULTATION REPORT  Referring Provider is Iran Ouch, MD PCP is Judithann Sheen, Duane Lope, MD  Chief Complaint  Patient presents with  . Aortic Stenosis    2nd TAVR eval, review all studies, surgery scheduled for 02/12/17    HPI:  Patient is an 84 year old female with history of aortic stenosis, chronic diastolic congestive heart failure, iron deficient anemia with recurrent GI bleeding due to AVMs, and osteoporosis with multiple rib fractures from falls who has been referred for second surgical consultation to discuss treatment options for management of severe symptomatic aortic stenosis.  She has been followed by Dr. Kirke Corin for several years with known history of aortic stenosis.  Transthoracic echocardiogram performed in 2017 revealed normal left ventricular systolic function with moderate aortic stenosis.  Patient has had recurrent problems with GI bleeding on numerous occasions and apparently had capsule endoscopy performed in May 2018 that revealed AVMs in her small intestine.  Follow-up echocardiogram performed August 28, 2016 revealed significant progression of disease with peak velocity across the aortic valve measured greater than 5 m/s corresponding to mean transvalvular gradient estimated 65 mmHg and aortic valve area calculated 0.75 cm.  Left ventricular systolic function remain normal with ejection fraction estimated 60-65%.  She developed progressive symptoms of exertional shortness of breath and was referred to Dr. Clifton James who performed diagnostic cardiac catheterization on October 05, 2016.  Catheterization confirmed the presence of severe aortic stenosis with peak to peak and mean transvalvular gradients measured 52 and 42 mmHg, respectively, corresponding to aortic valve area calculated 0.69 cm.  She did not have any significant coronary artery disease.  She was started on aspirin 81 mg daily  following catheterization to see if she would tolerate antiplatelet therapy.  Within a short period of time the patient was hospitalized with profound weakness in the setting of melena and iron deficient anemia with hemoglobin down to 6.1 from 11.9.  Aspirin was stopped.  EGD revealed mild gastritis and gastroenterology reportedly felt that the patient was most likely having bleeding from her previously known AVMs.  She had a second episode of GI bleeding 2 weeks later and again in December 2018, but her hemoglobin has remained stable ever since.  Her case was reviewed at length by a multidisciplinary team and specialist and she was ultimately referred for surgical consultation.  CT angiography was performed and she was evaluated by Dr. Laneta Simmers on January 30, 2017.  Transcatheter aortic valve replacement has been planned and the patient referred for a second surgical opinion.  The patient is separated from her husband and lives alone in an apartment in [place].  She has a son and daughter who live fairly close by and are very supportive.  Up until recently the patient has remained physically active and reasonably independent.  She does her own cooking and cleaning and drives an automobile.  However, she has been severely limited over the last several months or more with exertional shortness of breath.  She gets short of breath with moderate level activity.  She denies resting shortness of breath, PND, orthopnea, or lower extremity edema.  She has occasional dizzy spells and palpitations.  She has never had syncope.  Her gait and balance is unsteady and she uses a walker to help her for stability.  She has suffered several mechanical falls in the past.  She is additionally limited by severe degenerative arthritis and need for previous bilateral hip replacement  Past Medical History:  Diagnosis Date  . Anxiety   . Aortic stenosis    a. TTE 8/17: nl EF, mod to sev AS w/ mean gradient 28 mmHg, valve area 0.97,  mod pulm HTN, small pericardial effusion  . Arrhythmia   . Asthma   . AVM (arteriovenous malformation)    a. small intestine   . Broken back   . Broken wrist   . Heart murmur   . Hip fracture (HCC)    a. in the setting of mechanical fall  . Iron deficiency anemia    a. requiring periodic transfusions  . Syncope and collapse     Past Surgical History:  Procedure Laterality Date  . APPENDECTOMY    . BACK SURGERY     back fusion  . CARDIAC CATHETERIZATION    . ESOPHAGOGASTRODUODENOSCOPY (EGD) WITH PROPOFOL N/A 11/15/2015   Procedure: ESOPHAGOGASTRODUODENOSCOPY (EGD) WITH PROPOFOL;  Surgeon: [name], MD;  Location: [facility] ENDOSCOPY;  Service: Endoscopy;  Laterality: N/A;  . ESOPHAGOGASTRODUODENOSCOPY (EGD) WITH PROPOFOL N/A 10/12/2016   Procedure: ESOPHAGOGASTRODUODENOSCOPY (EGD) WITH PROPOFOL;  Surgeon: [name], MD;  Location: [facility] ENDOSCOPY;  Service: Endoscopy;  Laterality: N/A;  . FEMUR SURGERY     broken with rod  . FRACTURE SURGERY    . GIVENS CAPSULE STUDY N/A 02/20/2016   Procedure: GIVENS CAPSULE STUDY;  Surgeon: Scot Jun, MD;  Location: [facility] ENDOSCOPY;  Service: Endoscopy;  Laterality: N/A;  . HIP FRACTURE SURGERY    . INTRAMEDULLARY (IM) NAIL INTERTROCHANTERIC Right 11/18/2015   Procedure: INTRAMEDULLARY (IM) NAIL INTERTROCHANTRIC;  Surgeon: Juanell Fairly, MD;  Location: [facility] ORS;  Service: Orthopedics;  Laterality: Right;  . KYPHOPLASTY N/A 08/19/2014   Procedure: KYPHOPLASTY;  Surgeon: Kennedy Bucker, MD;  Location: [facility] ORS;  Service: Orthopedics;  Laterality: N/A;  . PARTIAL HYSTERECTOMY    . RIGHT/LEFT HEART CATH AND CORONARY ANGIOGRAPHY N/A 10/05/2016   Procedure: RIGHT/LEFT HEART CATH AND CORONARY ANGIOGRAPHY;  Surgeon: Kathleene Hazel, MD;  Location: [facility] INVASIVE CV LAB;  Service: Cardiovascular;  Laterality: N/A;  . TUBAL LIGATION      Family History  Problem Relation Age of Onset  . Heart block Mother   . Heart failure Father   . Heart  disease Sister   . Breast cancer Maternal Aunt     Social History   Socioeconomic History  . Marital status: Divorced    Spouse name: Not on file  . Number of children: Not on file  . Years of education: Not on file  . Highest education level: Not on file  Social Needs  . Financial resource strain: Not on file  . Food insecurity - worry: Not on file  . Food insecurity - inability: Not on file  . Transportation needs - medical: Not on file  . Transportation needs - non-medical: Not on file  Occupational History  . Not on file  Tobacco Use  . Smoking status: Never Smoker  . Smokeless tobacco: Never Used  Substance and Sexual Activity  . Alcohol use: No  . Drug use: No  . Sexual activity: Not on file  Other Topics Concern  . Not on file  Social History Narrative  . Not on file    Current Outpatient Medications  Medication Sig Dispense Refill  . acetaminophen (TYLENOL) 325 MG tablet Take 325 mg by mouth every 6 (six) hours as needed for moderate pain or headache.     . Cholecalciferol (VITAMIN D3 PO) Take 1 capsule by mouth daily.    Marland Kitchen  vitamin B-12 (CYANOCOBALAMIN) 1000 MCG tablet Take 1,000 mcg by mouth daily.    Marland Kitchen Fexofenadine HCl (ALLEGRA ALLERGY CHILDRENS PO) Take 1 tablet by mouth daily.     No current facility-administered medications for this visit.     Allergies  Allergen Reactions  . Aspirin Other (See Comments)    GI bleeding   . Penicillins Rash and Other (See Comments)    Has patient had a PCN reaction causing immediate rash, facial/tongue/throat swelling, SOB or lightheadedness with hypotension: Yes Has patient had a PCN reaction causing severe rash involving mucus membranes or skin necrosis: No Has patient had a PCN reaction that required hospitalization: No Has patient had a PCN reaction occurring within the last 10 years: No If all of the above answers are "NO", then may proceed with Cephalosporin use.       Review of Systems:   General:  normal  appetite, decreased energy, no weight gain, no weight loss, no fever  Cardiac:  no chest pain with exertion, no chest pain at rest, +SOB with exertion, no resting SOB, no PND, no orthopnea, + palpitations, no arrhythmia, no atrial fibrillation, no LE edema, + dizzy spells, no syncope  Respiratory:  + shortness of breath, no home oxygen, no productive cough, no dry cough, no bronchitis, no wheezing, no hemoptysis, + asthma, no pain with inspiration or cough, no sleep apnea, no CPAP at night  GI:   no difficulty swallowing, no reflux, + frequent heartburn, no hiatal hernia, no abdominal pain, no constipation, no diarrhea, no hematochezia, no hematemesis, + melena  GU:   no dysuria,  no frequency, no urinary tract infection, no hematuria, no kidney stones, no kidney disease  Vascular:  no pain suggestive of claudication, no pain in feet, no leg cramps, no varicose veins, no DVT, no non-healing foot ulcer  Neuro:   no stroke, no TIA's, no seizures, no headaches, no temporary blindness one eye,  no slurred speech, no peripheral neuropathy, + chronic pain, + instability of gait, no memory/cognitive dysfunction  Musculoskeletal: + arthritis, no joint swelling, no myalgias, + difficulty walking, limited mobility   Skin:   no rash, no itching, no skin infections, no pressure sores or ulcerations  Psych:   no anxiety, no depression, no nervousness, no unusual recent stress  Eyes:   no blurry vision, no floaters, no recent vision changes, + wears glasses or contacts  ENT:   + hearing loss, no loose or painful teeth, no dentures, last saw dentist last month  Hematologic:  no easy bruising, no abnormal bleeding, no clotting disorder, no frequent epistaxis  Endocrine:  no diabetes, does not check CBG's at home           Physical Exam:   BP 137/64   Pulse 88   Resp 20   Ht 5\' 1"  (1.549 m)   Wt 110 lb (49.9 kg)   SpO2 97% Comment: RA  BMI 20.78 kg/m   General:  Thin, elderly and  frail-appearing  HEENT:  Unremarkable   Neck:   no JVD, no bruits, no adenopathy   Chest:   clear to auscultation, symmetrical breath sounds, no wheezes, no rhonchi   CV:   RRR, grade III/VI crescendo/decrescendo murmur heard best at RSB,  no diastolic murmur  Abdomen:  soft, non-tender, no masses   Extremities:  warm, well-perfused, pulses diminished but palpable, no LE edema  Rectal/GU  Deferred  Neuro:   Grossly non-focal and symmetrical throughout  Skin:   Clean and  dry, no rashes, no breakdown   Diagnostic Tests:  Transthoracic Echocardiography  Patient:    Janace, Decker MR #:       [id number] Study Date: 08/28/2016 Gender:     F Age:        68 Height:     165.1 cm Weight:     51.7 kg BSA:        1.53 m^2 Pt. Status: Room:   ATTENDING    Lavonne Chick, Ryan M  REFERRING    Sondra Barges  PERFORMING   [place], [place]  SONOGRAPHER  Quentin Ore, RVT, RDCS, RDMS  cc:  ------------------------------------------------------------------- LV EF: 60% -   65%  ------------------------------------------------------------------- History:   PMH:   Dyspnea and murmur.  Aortic valve disease.  Risk factors:  Nonsmoker.  ------------------------------------------------------------------- Study Conclusions  - Left ventricle: The cavity size was normal. There was mild   concentric hypertrophy. Systolic function was normal. The   estimated ejection fraction was in the range of 60% to 65%. Wall   motion was normal; there were no regional wall motion   abnormalities. Doppler parameters are consistent with abnormal   left ventricular relaxation (grade 1 diastolic dysfunction). - Aortic valve: There was severe stenosis. Mean gradient (S): 65 mm   Hg. Valve area (VTI): 0.75 cm^2. - Mitral valve: Valve area by pressure half-time: 2.06 cm^2. - Pulmonary arteries: Systolic pressure was mildly increased. PA   peak pressure: 38 mm Hg (S).  Impressions:  -  Aortic stenosis worsened since last year.  ------------------------------------------------------------------- Study data:  The previous study was not available, so comparison was made to the report of August 2017.  Study status:  Routine. Procedure:  Transthoracic echocardiography. Image quality was fair. The study was technically difficult, as a result of poor acoustic windows.  Study completion:  There were no complications. Transthoracic echocardiography.  M-mode, complete 2D, spectral Doppler, and color Doppler.  Birthdate:  Patient birthdate: 1930-07-22.  Age:  Patient is 84 yr old.  Sex:  Gender: female. BMI: 19 kg/m^2.  Blood pressure:     138/60  Patient status: Outpatient.  Study date:  Study date: 08/28/2016. Study time: 01:33 PM.  -------------------------------------------------------------------  ------------------------------------------------------------------- Left ventricle:  The cavity size was normal. There was mild concentric hypertrophy. Systolic function was normal. The estimated ejection fraction was in the range of 60% to 65%. Wall motion was normal; there were no regional wall motion abnormalities. Doppler parameters are consistent with abnormal left ventricular relaxation (grade 1 diastolic dysfunction).  ------------------------------------------------------------------- Aortic valve:   Trileaflet; mildly thickened, severely calcified leaflets.  Doppler:   There was severe stenosis.   There was no regurgitation.    VTI ratio of LVOT to aortic valve: 0.27. Valve area (VTI): 0.75 cm^2. Indexed valve area (VTI): 0.49 cm^2/m^2. Mean velocity ratio of LVOT to aortic valve: 0.25. Valve area (Vmean): 0.71 cm^2. Indexed valve area (Vmean): 0.46 cm^2/m^2. Mean gradient (S): 65 mm Hg. Peak gradient (S): 106 mm Hg.  ------------------------------------------------------------------- Aorta:  Aortic root: The aortic root was normal in  size.  ------------------------------------------------------------------- Mitral valve:   Structurally normal valve.   Mobility was not restricted.  Doppler:  Transvalvular velocity was within the normal range. There was no evidence for stenosis. There was no regurgitation.    Valve area by pressure half-time: 2.06 cm^2. Indexed valve area by pressure half-time: 1.34 cm^2/m^2.    Peak gradient (D): 4 mm Hg.  ------------------------------------------------------------------- Left atrium:  The  atrium was normal in size.  ------------------------------------------------------------------- Right ventricle:  The cavity size was normal. Wall thickness was normal. Systolic function was normal.  ------------------------------------------------------------------- Pulmonic valve:    Doppler:  Transvalvular velocity was within the normal range. There was no evidence for stenosis.  ------------------------------------------------------------------- Tricuspid valve:   Structurally normal valve.    Doppler: Transvalvular velocity was within the normal range. There was mild regurgitation.  ------------------------------------------------------------------- Pulmonary artery:   The main pulmonary artery was normal-sized. Systolic pressure was mildly increased.  ------------------------------------------------------------------- Right atrium:  The atrium was normal in size.  ------------------------------------------------------------------- Pericardium:  There was no pericardial effusion.  ------------------------------------------------------------------- Systemic veins: Inferior vena cava: Poorly visualized. The vessel was normal in size.  ------------------------------------------------------------------- Measurements   Left ventricle                           Value          Reference  LV ID, ED, PLAX chordal          (L)     32    mm       43 - 52  LV ID, ES, PLAX  chordal          (L)     19    mm       23 - 38  LV fx shortening, PLAX chordal           41    %        >=29  LV PW thickness, ED                      11    mm       ----------  IVS/LV PW ratio, ED                      1              <=1.3  Stroke volume, 2D                        93    ml       ----------  Stroke volume/bsa, 2D                    61    ml/m^2   ----------  LV e&', lateral                           10.3  cm/s     ----------  LV E/e&', lateral                         9.09           ----------  LV e&', medial                            5.87  cm/s     ----------  LV E/e&', medial                          15.95          ----------  LV e&', average                           8.09  cm/s     ----------  LV E/e&', average                         11.58          ----------    Ventricular septum                       Value          Reference  IVS thickness, ED                        11    mm       ----------    LVOT                                     Value          Reference  LVOT ID, S                               19    mm       ----------  LVOT area                                2.84  cm^2     ----------  LVOT ID                                  19    mm       ----------  LVOT mean velocity, S                    94.1  cm/s     ----------  LVOT VTI, S                              32.9  cm       ----------  Stroke volume (SV), LVOT DP              93.3  ml       ----------  Stroke index (SV/bsa), LVOT DP           60.8  ml/m^2   ----------    Aortic valve                             Value          Reference  Aortic valve peak velocity, S            515   cm/s     ----------  Aortic valve mean velocity, S            378   cm/s     ----------  Aortic valve VTI, S                      124   cm       ----------  Aortic mean gradient, S                  65    mm Hg    ----------  Aortic peak gradient, S                  106   mm Hg    ----------  VTI ratio, LVOT/AV                        0.27           ----------  Aortic valve area, VTI                   0.75  cm^2     ----------  Aortic valve area/bsa, VTI               0.49  cm^2/m^2 ----------  Velocity ratio, mean, LVOT/AV            0.25           ----------  Aortic valve area, mean velocity         0.71  cm^2     ----------  Aortic valve area/bsa, mean              0.46  cm^2/m^2 ----------  velocity    Aorta                                    Value          Reference  Aortic root ID, ED                       26    mm       ----------  Ascending aorta ID, A-P, S               28    mm       ----------    Left atrium                              Value          Reference  LA ID, A-P, ES                           28    mm       ----------  LA ID/bsa, A-P                           1.83  cm/m^2   <=2.2  LA volume, ES, 1-p A4C                   31.4  ml       ----------  LA volume/bsa, ES, 1-p A4C               20.5  ml/m^2   ----------  LA volume, ES, 1-p A2C                   47.6  ml       ----------  LA volume/bsa, ES, 1-p A2C               31    ml/m^2   ----------    Mitral valve                             Value  Reference  Mitral E-wave peak velocity              93.6  cm/s     ----------  Mitral A-wave peak velocity              121   cm/s     ----------  Mitral deceleration time         (H)     366   ms       150 - 230  Mitral pressure half-time                107   ms       ----------  Mitral peak gradient, D                  4     mm Hg    ----------  Mitral E/A ratio, peak                   0.8            ----------  Mitral valve area, PHT, DP               2.06  cm^2     ----------  Mitral valve area/bsa, PHT, DP           1.34  cm^2/m^2 ----------    Pulmonary arteries                       Value          Reference  PA pressure, S, DP               (H)     38    mm Hg    <=30    Tricuspid valve                          Value          Reference  Tricuspid regurg peak velocity           286    cm/s     ----------  Tricuspid peak RV-RA gradient            33    mm Hg    ----------    Right atrium                             Value          Reference  RA ID, S-I, ES, A4C                      40.9  mm       34 - 49  RA area, ES, A4C                         9.17  cm^2     8.3 - 19.5  RA volume, ES, A/L                       16.2  ml       ----------  RA volume/bsa, ES, A/L                   10.6  ml/m^2   ----------    Right ventricle  Value          Reference  TAPSE                                    26.2  mm       ----------  RV s&', lateral, S                        12.7  cm/s     ----------    Pulmonic valve                           Value          Reference  Pulmonic valve peak velocity, S          123   cm/s     ----------  Legend: (L)  and  (H)  mark values outside specified reference range.  ------------------------------------------------------------------- Prepared and Electronically Authenticated by  Lorine Bears, MD 2018-08-28T18:16:44   RIGHT/LEFT HEART CATH AND CORONARY ANGIOGRAPHY  Conclusion   1. No angiographic evidence of CAD 2. Severe aortic valve stenosis (mean gradient 42.4 mmHg, peak to peak gradient 52 mmHg, AVA 0.69 cm2).   Recommendations: Will continue with workup for TAVR  Indications   Severe aortic stenosis [I35.0 (ICD-10-CM)]  Procedural Details/Technique   Technical Details Indication: 84 yo female with severe AS.  Procedure: The risks, benefits, complications, treatment options, and expected outcomes were discussed with the patient. The patient and/or family concurred with the proposed plan, giving informed consent. The patient was brought to the cath lab after IV hydration was given. The patient was further sedated with Versed. The right groin was prepped and draped. 1% lidocaine used for local anesthesia. 7 French sheath placed in there right femoral vein under u/s guidance. Right heart cath performed with  balloon tipped catheter. The right wrist was prepped and draped in a sterile fashion. 1% lidocaine was used for local anesthesia. Using the modified Seldinger access technique, a 5 French sheath was placed in the right radial artery. 3 mg Verapamil was given through the sheath. 2500 units IV heparin was given. Standard diagnostic catheters were used to perform selective coronary angiography. I crossed the aortic valve with an AL-1 catheter and a straight wire. The sheath was removed from the right radial artery and a Terumo hemostasis band was applied at the arteriotomy site on the right wrist.      Estimated blood loss <50 mL.  During this procedure the patient was administered the following to achieve and maintain moderate conscious sedation: Versed 1 mg, while the patient's heart rate, blood pressure, and oxygen saturation were continuously monitored. The period of conscious sedation was 52 minutes, of which I was present face-to-face 100% of this time.  Complications   Complications documented before study signed (10/05/2016 12:58 PM EDT)    RIGHT/LEFT HEART CATH AND CORONARY ANGIOGRAPHY   None Documented by Kathleene Hazel, MD 10/05/2016 12:36 PM EDT  Time Range: Intra-procedure      Coronary Findings   Diagnostic  Dominance: Right  Left Anterior Descending  Vessel is large. Vessel is angiographically normal.  First Diagonal Branch  Vessel is large in size.  First Septal Branch  Vessel is small in size.  Second Diagonal Branch  Vessel is small in size.  Second Septal Branch  Vessel is small in size.  Third Diagonal  Branch  Vessel is small in size.  Third Septal Branch  Vessel is small in size.  Left Circumflex  Vessel is large. Vessel is angiographically normal.  Right Coronary Artery  Vessel is large. Vessel is angiographically normal.  Intervention   No interventions have been documented.  Left Heart   Aortic Valve There is severe aortic valve stenosis. The  aortic valve is calcified.  Coronary Diagrams   Diagnostic Diagram       Implants     No implant documentation for this case.  MERGE Images   Show images for CARDIAC CATHETERIZATION   Link to Procedure Log   Procedure Log    Hemo Data    Most Recent Value  Fick Cardiac Output 5.54 L/min  Fick Cardiac Output Index 3.57 (L/min)/BSA  Aortic Mean Gradient 42.4 mmHg  Aortic Peak Gradient 52 mmHg  Aortic Valve Area 0.69  Aortic Value Area Index 0.45 cm2/BSA  RA A Wave 2 mmHg  RA V Wave 2 mmHg  RA Mean 1 mmHg  RV Systolic Pressure 23 mmHg  RV Diastolic Pressure 2 mmHg  RV EDP 4 mmHg  PA Systolic Pressure 22 mmHg  PA Diastolic Pressure 10 mmHg  PA Mean 15 mmHg  PW A Wave 13 mmHg  PW V Wave 13 mmHg  PW Mean 8 mmHg  AO Systolic Pressure 125 mmHg  AO Diastolic Pressure 54 mmHg  AO Mean 85 mmHg  LV Systolic Pressure 208 mmHg  LV Diastolic Pressure 5 mmHg  LV EDP 17 mmHg  Arterial Occlusion Pressure Extended Systolic Pressure 166 mmHg  Arterial Occlusion Pressure Extended Diastolic Pressure 75 mmHg  Arterial Occlusion Pressure Extended Mean Pressure 116 mmHg  Left Ventricular Apex Extended Systolic Pressure 218 mmHg  Left Ventricular Apex Extended Diastolic Pressure 7 mmHg  Left Ventricular Apex Extended EDP Pressure 17 mmHg  QP/QS 1  TPVR Index 4.2 HRUI  TSVR Index 23.79 HRUI  PVR SVR Ratio 0.08  TPVR/TSVR Ratio 0.18    Cardiac TAVR CT  TECHNIQUE: The patient was scanned on a [employer]. A 120 kV retrospective scan was triggered in the descending thoracic aorta at 111 HU's. Gantry rotation speed was 250 msecs and collimation was .6 mm. No beta blockade or nitro were given. The 3D data set was reconstructed in 5% intervals of the R-R cycle. Systolic and diastolic phases were analyzed on a dedicated work station using MPR, MIP and VRT modes. The patient received 80 cc of contrast.  FINDINGS: Aortic Valve: Severely thickened and calcified aortic  valve with severely restricted leaflet opening and minimal calcifications extending into the LVOT under the non-coronary leaflet.  Aorta: Normal size, mild diffuse calcifications and no dissection.  Sinotubular Junction: 25 x 23 mm  Ascending Thoracic Aorta: 28 x 27 mm  Aortic Arch: 26 x 24 mm  Descending Thoracic Aorta: 22 x 22 mm  Sinus of Valsalva Measurements:  Non-coronary: 28 mm  Right -coronary: 25 mm  Left -coronary: 27 mm  Sinus of Valsalva Height:  Right -coronary: 28 mm  Left -coronary: 19 mm  Coronary Artery Height above Annulus:  Left Main: 10 mm  Right Coronary: 11 mm  Virtual Basal Annulus Measurements:  Maximum/Minimum Diameter: 24.4 x 19.7 mm  Mean Diameter: 22.0 mm  Perimeter: 71.4 mm  Area: 379 mm2  Optimum Fluoroscopic Angle for Delivery: LAO 19 CAU 17.  IMPRESSION: 1. Severely thickened and calcified aortic valve with severely restricted leaflet opening and minimal calcifications extending into the LVOT under the  non-coronary leaflet. Annular measurements suitable for delivery of a 23 mm Edwards-SAPIEN 3 valve and a 26 mm Evolut Pro valve.  2. Sufficient coronary to annulus distance.  3. Optimum Fluoroscopic Angle for Delivery:  LAO 19 CAU 17.  4. No thrombus in the left atrial appendage.   Electronically Signed   By: Tobias Alexander   On: 01/29/2017 13:58  \ CT ANGIOGRAPHY CHEST, ABDOMEN AND PELVIS  TECHNIQUE: Multidetector CT imaging through the chest, abdomen and pelvis was performed using the standard protocol during bolus administration of intravenous contrast. Multiplanar reconstructed images and MIPs were obtained and reviewed to evaluate the vascular anatomy.  CONTRAST:  95 mL of Isovue 370.  COMPARISON:  CT the abdomen and pelvis 01/29/2013.  FINDINGS: CTA CHEST FINDINGS  Cardiovascular: Heart size is mildly enlarged with left atrial dilatation. There is no significant  pericardial fluid, thickening or pericardial calcification. Aortic atherosclerosis. Severe thickening calcification of the aortic valve.  Mediastinum/Lymph Nodes: No pathologically enlarged mediastinal or hilar lymph nodes. Esophagus is unremarkable in appearance. No axillary lymphadenopathy.  Lungs/Pleura: Small calcified granuloma in the right upper lobe in an area of focal architectural distortion which likely reflects some mild post infectious or inflammatory scarring. No other suspicious appearing pulmonary nodules or masses. No acute consolidative airspace disease. No pleural effusions.  Musculoskeletal/Soft Tissues: Chronic compression fracture of T10 with post vertebroplasty changes and approximately 40% loss of anterior vertebral body height. There are no aggressive appearing lytic or blastic lesions noted in the visualized portions of the skeleton.  CTA ABDOMEN AND PELVIS FINDINGS  Hepatobiliary: No suspicious cystic or solid hepatic lesions. No intra or extrahepatic biliary ductal dilatation. Gallbladder is nearly completely decompressed, but otherwise unremarkable in appearance.  Pancreas: No pancreatic mass. No pancreatic ductal dilatation. No pancreatic or peripancreatic fluid or inflammatory changes.  Spleen: Unremarkable.  Adrenals/Urinary Tract: Bilateral kidneys and left adrenal gland are normal in appearance. 11 x 8 mm right adrenal nodule, similar to prior study, at which point this was low-attenuation, most compatible with an adenoma. No hydroureteronephrosis. Urinary bladder is normal in appearance.  Stomach/Bowel: Normal appearance of the stomach. No pathologic dilatation of small bowel or colon. Numerous colonic diverticulae are noted, particularly in the sigmoid colon, without surrounding inflammatory changes to suggest an acute diverticulitis at this time. The appendix is not confidently identified and may be surgically absent. Regardless,  there are no inflammatory changes noted adjacent to the cecum to suggest the presence of an acute appendicitis at this time.  Vascular/Lymphatic: Aortic atherosclerosis, without evidence of aneurysm or dissection in the abdominal or pelvic vasculature. Vascular findings and measurements pertinent to potential TAVR procedure, as detailed below. Celiac axis, superior mesenteric artery, inferior mesenteric artery and single renal arteries bilaterally are all widely patent without hemodynamically significant stenosis. No lymphadenopathy noted in the abdomen or pelvis.  Reproductive: Prostate gland and seminal vesicles are unremarkable in appearance.  Other: No significant volume of ascites.  No pneumoperitoneum.  Musculoskeletal: Chronic compression fractures of L1 and L2 with approximately 30% loss of height at both levels and post vertebroplasty changes at L2. Orthopedic fixation hardware in the proximal femurs bilaterally. There are no aggressive appearing lytic or blastic lesions noted in the visualized portions of the skeleton.  VASCULAR MEASUREMENTS PERTINENT TO TAVR:  AORTA:  Minimal Aortic Diameter-11 x 11 mm  Severity of Aortic Calcification-severe  RIGHT PELVIS:  Right Common Iliac Artery -  Minimal Diameter-8.8 x 8.0 mm  Tortuosity-mild  Calcification-moderate  Right External Iliac Artery -  Minimal Diameter-6.5 x 6.3 mm  Tortuosity-moderate  Calcification-mild  Right Common Femoral Artery -  Minimal Diameter-7.4 x 6.8 mm  Tortuosity-mild  Calcification-none  LEFT PELVIS:  Left Common Iliac Artery -  Minimal Diameter-8.9 x 8.1 mm  Tortuosity-mild  Calcification-moderate  Left External Iliac Artery -  Minimal Diameter-6.7 x 6.5 mm  Tortuosity-moderate  Calcification-none  Left Common Femoral Artery -  Minimal Diameter-7.0 x 7.1 mm  Tortuosity-mild  Calcification-none  Review of the MIP images  confirms the above findings.  IMPRESSION: 1. Vascular findings and measurements pertinent to potential TAVR procedure, as detailed above. 2. Severe thickening and calcification of the aortic valve, compatible with the reported clinical history of severe aortic stenosis. 3. Mild cardiomegaly with mild left atrial dilatation. 4. Additional incidental findings, as above.  Aortic Atherosclerosis (ICD10-I70.0).   Electronically Signed   By: Trudie Reed M.D.   On: 01/29/2017 11:49    Impression:  Patient has stage D severe symptomatic aortic stenosis.  She describes stable but progressive symptoms of exertional shortness of breath and fatigue consistent with chronic diastolic congestive heart failure, [place] Heart Association functional class III.  She has had dizzy spells in the past and multiple falls with fractures.  Her history is further complicated by the problem of recurrent GI bleeding felt likely related to AV malformations.  She was recently treated with low-dose aspirin but again had recurrent GI bleeding.  Her last episode of melena and acute blood loss anemia was 2 months ago.  I have personally reviewed the patient's recent echocardiogram, cardiac catheterization, and CT angiograms.  Patient has severe, bordering on critical aortic stenosis.  Peak velocity across the aortic valve measured greater than 5 m/s on her most recent transthoracic echocardiogram, corresponding to mean transvalvular gradient estimated 65 mmHg.  Mean transvalvular gradient was directly measured 42 mmHg at catheterization, corresponding to aortic valve area less than 0.7 cm.  She does not have significant coronary artery disease and left ventricular function remains normal.  Risks associated with conventional surgery would unquestionably be high in this elderly patient, and probably considerably higher than that predicted using the STS risk calculator.  I would be very reluctant to consider this  patient a candidate for elective surgery.  Cardiac-gated CTA of the heart reveals anatomical characteristics consistent with aortic stenosis suitable for treatment by transcatheter aortic valve replacement without any significant complicating features, although there is one small area of calcification in the aortic annulus beneath the noncoronary leaflet which might slightly increase the risk of paravalvular leak.  CTA of the aorta and iliac vessels demonstrate what appears to be adequate pelvic vascular access to facilitate a transfemoral approach.  I agree that TAVR seems to be a reasonable option under the circumstances.  The patient may be at increased risk of valve thrombosis and/or premature deterioration if she cannot tolerate some type of anticoagulation after valve replacement.    Plan:  The patient and her two children were counseled at length regarding treatment alternatives for management of severe symptomatic aortic stenosis. Alternative approaches such as conventional aortic valve replacement, transcatheter aortic valve replacement, and palliative medical therapy were compared and contrasted at length.  The risks associated with conventional surgical aortic valve replacement were been discussed in detail, as were expectations for post-operative convalescence, and why I would be reluctant to consider this patient a candidate for conventional surgery.  Issues specific to transcatheter aortic valve replacement were discussed including questions about long term valve durability, the potential for paravalvular  leak, possible increased risk of need for permanent pacemaker placement, and other technical complications related to the procedure itself.  Long-term prognosis with medical therapy was discussed. This discussion was placed in the context of the patient's own specific clinical presentation and past medical history.  All of their questions been addressed.  The patient hopes to proceed with  transcatheter aortic valve replacement next week as previously scheduled.  Following the decision to proceed with transcatheter aortic valve replacement, a discussion has been held regarding what types of management strategies would be attempted intraoperatively in the event of life-threatening complications, including whether or not the patient would be considered a candidate for the use of cardiopulmonary bypass and/or conversion to open sternotomy for attempted surgical intervention.  The patient has been advised of a variety of complications that might develop including but not limited to risks of death, stroke, paravalvular leak, aortic dissection or other major vascular complications, aortic annulus rupture, device embolization, cardiac rupture or perforation, mitral regurgitation, acute myocardial infarction, arrhythmia, heart block or bradycardia requiring permanent pacemaker placement, congestive heart failure, respiratory failure, renal failure, pneumonia, infection, other late complications related to structural valve deterioration or migration, or other complications that might ultimately cause a temporary or permanent loss of functional independence or other long term morbidity.  The patient provides full informed consent for the procedure as described and all questions were answered.    I spent in excess of 90 minutes during the conduct of this office consultation and >50% of this time involved direct face-to-face encounter with the patient for counseling and/or coordination of their care.     Salvatore Decent. Cornelius Moras, MD 02/06/2017 2:07 PM

## 2017-02-07 LAB — TYPE AND SCREEN
ABO/RH(D): A POS
ANTIBODY SCREEN: NEGATIVE

## 2017-02-08 LAB — MRSA CULTURE: CULTURE: NOT DETECTED

## 2017-02-11 MED ORDER — POTASSIUM CHLORIDE 2 MEQ/ML IV SOLN
80.0000 meq | INTRAVENOUS | Status: DC
  Filled 2017-02-11: qty 40

## 2017-02-11 MED ORDER — DEXTROSE 5 % IV SOLN
0.0000 ug/min | INTRAVENOUS | Status: DC
  Filled 2017-02-11: qty 4

## 2017-02-11 MED ORDER — DEXMEDETOMIDINE HCL IN NACL 400 MCG/100ML IV SOLN
0.1000 ug/kg/h | INTRAVENOUS | Status: AC
  Administered 2017-02-12: .8 ug/kg/h via INTRAVENOUS
  Filled 2017-02-11: qty 100

## 2017-02-11 MED ORDER — NOREPINEPHRINE BITARTRATE 1 MG/ML IV SOLN
0.0000 ug/min | INTRAVENOUS | Status: AC
  Administered 2017-02-12: 1 ug/min via INTRAVENOUS
  Filled 2017-02-11: qty 4

## 2017-02-11 MED ORDER — DOPAMINE-DEXTROSE 3.2-5 MG/ML-% IV SOLN
0.0000 ug/kg/min | INTRAVENOUS | Status: DC
  Filled 2017-02-11: qty 250

## 2017-02-11 MED ORDER — NITROGLYCERIN IN D5W 200-5 MCG/ML-% IV SOLN
2.0000 ug/min | INTRAVENOUS | Status: DC
  Filled 2017-02-11: qty 250

## 2017-02-11 MED ORDER — SODIUM CHLORIDE 0.9 % IV SOLN: INTRAVENOUS | Status: DC

## 2017-02-11 MED ORDER — LEVOFLOXACIN IN D5W 500 MG/100ML IV SOLN
500.0000 mg | INTRAVENOUS | Status: AC
  Administered 2017-02-12: 500 mg via INTRAVENOUS
  Filled 2017-02-11: qty 100

## 2017-02-11 MED ORDER — MAGNESIUM SULFATE 50 % IJ SOLN
40.0000 meq | INTRAMUSCULAR | Status: DC
  Filled 2017-02-11: qty 9.85

## 2017-02-11 MED ORDER — SODIUM CHLORIDE 0.9 % IV SOLN
INTRAVENOUS | Status: DC
  Filled 2017-02-11: qty 30

## 2017-02-11 MED ORDER — SODIUM CHLORIDE 0.9 % IV SOLN
INTRAVENOUS | Status: DC
  Filled 2017-02-11: qty 1

## 2017-02-11 MED ORDER — SODIUM CHLORIDE 0.9 % IV SOLN
30.0000 ug/min | INTRAVENOUS | Status: DC
  Filled 2017-02-11: qty 2

## 2017-02-11 MED ORDER — VANCOMYCIN HCL IN DEXTROSE 1-5 GM/200ML-% IV SOLN
1000.0000 mg | INTRAVENOUS | Status: AC
  Administered 2017-02-12: 1000 mg via INTRAVENOUS
  Filled 2017-02-11 (×2): qty 200

## 2017-02-11 NOTE — Progress Notes (Signed)
PCR continues to read invalid result; please address on DOS.

## 2017-02-11 NOTE — Progress Notes (Signed)
Culture negative for MRSA.

## 2017-02-12 ENCOUNTER — Inpatient Hospital Stay (HOSPITAL_COMMUNITY)
Admission: RE | Admit: 2017-02-12 | Discharge: 2017-02-15 | DRG: 266 | Disposition: A | Discharge status: Home / Self Care | Payer: Medicare Other | Source: Ambulatory Visit | Attending: Cardiovascular Disease | Admitting: Cardiovascular Disease

## 2017-02-12 ENCOUNTER — Other Ambulatory Visit (HOSPITAL_COMMUNITY): Payer: Medicare Other

## 2017-02-12 ENCOUNTER — Encounter (HOSPITAL_COMMUNITY)
Admission: RE | Disposition: A | Discharge status: Home / Self Care | Payer: Self-pay | Source: Ambulatory Visit | Attending: Cardiovascular Disease

## 2017-02-12 ENCOUNTER — Inpatient Hospital Stay (HOSPITAL_COMMUNITY): Payer: Medicare Other | Admitting: Vascular Surgery

## 2017-02-12 ENCOUNTER — Inpatient Hospital Stay (HOSPITAL_COMMUNITY): Payer: Medicare Other | Admitting: Certified Registered Nurse Anesthetist

## 2017-02-12 ENCOUNTER — Encounter (HOSPITAL_COMMUNITY): Payer: Self-pay

## 2017-02-12 ENCOUNTER — Inpatient Hospital Stay (HOSPITAL_COMMUNITY): Payer: Medicare Other

## 2017-02-12 ENCOUNTER — Other Ambulatory Visit: Payer: Self-pay

## 2017-02-12 DIAGNOSIS — I700 Atherosclerosis of aorta: Secondary | ICD-10-CM | POA: Diagnosis present

## 2017-02-12 DIAGNOSIS — Z96643 Presence of artificial hip joint, bilateral: Secondary | ICD-10-CM | POA: Diagnosis present

## 2017-02-12 DIAGNOSIS — Z8249 Family history of ischemic heart disease and other diseases of the circulatory system: Secondary | ICD-10-CM | POA: Diagnosis not present

## 2017-02-12 DIAGNOSIS — Z90710 Acquired absence of both cervix and uterus: Secondary | ICD-10-CM

## 2017-02-12 DIAGNOSIS — Z981 Arthrodesis status: Secondary | ICD-10-CM | POA: Diagnosis not present

## 2017-02-12 DIAGNOSIS — Z880 Allergy status to penicillin: Secondary | ICD-10-CM

## 2017-02-12 DIAGNOSIS — N179 Acute kidney failure, unspecified: Secondary | ICD-10-CM | POA: Diagnosis not present

## 2017-02-12 DIAGNOSIS — I313 Pericardial effusion (noninflammatory): Secondary | ICD-10-CM | POA: Diagnosis not present

## 2017-02-12 DIAGNOSIS — I447 Left bundle-branch block, unspecified: Secondary | ICD-10-CM | POA: Diagnosis not present

## 2017-02-12 DIAGNOSIS — D509 Iron deficiency anemia, unspecified: Secondary | ICD-10-CM | POA: Diagnosis present

## 2017-02-12 DIAGNOSIS — Z886 Allergy status to analgesic agent status: Secondary | ICD-10-CM

## 2017-02-12 DIAGNOSIS — I361 Nonrheumatic tricuspid (valve) insufficiency: Secondary | ICD-10-CM | POA: Diagnosis not present

## 2017-02-12 DIAGNOSIS — Q2733 Arteriovenous malformation of digestive system vessel: Secondary | ICD-10-CM | POA: Diagnosis not present

## 2017-02-12 DIAGNOSIS — I3139 Other pericardial effusion (noninflammatory): Secondary | ICD-10-CM

## 2017-02-12 DIAGNOSIS — Z952 Presence of prosthetic heart valve: Secondary | ICD-10-CM

## 2017-02-12 DIAGNOSIS — I309 Acute pericarditis, unspecified: Secondary | ICD-10-CM | POA: Diagnosis not present

## 2017-02-12 DIAGNOSIS — Z006 Encounter for examination for normal comparison and control in clinical research program: Secondary | ICD-10-CM | POA: Diagnosis not present

## 2017-02-12 DIAGNOSIS — I5033 Acute on chronic diastolic (congestive) heart failure: Secondary | ICD-10-CM | POA: Diagnosis present

## 2017-02-12 DIAGNOSIS — D649 Anemia, unspecified: Secondary | ICD-10-CM | POA: Diagnosis present

## 2017-02-12 DIAGNOSIS — I959 Hypotension, unspecified: Secondary | ICD-10-CM | POA: Diagnosis not present

## 2017-02-12 DIAGNOSIS — Z9181 History of falling: Secondary | ICD-10-CM

## 2017-02-12 DIAGNOSIS — Q2730 Arteriovenous malformation, site unspecified: Secondary | ICD-10-CM

## 2017-02-12 DIAGNOSIS — I350 Nonrheumatic aortic (valve) stenosis: Secondary | ICD-10-CM | POA: Diagnosis present

## 2017-02-12 DIAGNOSIS — Z79899 Other long term (current) drug therapy: Secondary | ICD-10-CM | POA: Diagnosis not present

## 2017-02-12 DIAGNOSIS — I340 Nonrheumatic mitral (valve) insufficiency: Secondary | ICD-10-CM | POA: Diagnosis not present

## 2017-02-12 HISTORY — PX: TRANSCATHETER AORTIC VALVE REPLACEMENT, TRANSFEMORAL: SHX6400

## 2017-02-12 HISTORY — DX: Chronic diastolic (congestive) heart failure: I50.32

## 2017-02-12 HISTORY — DX: Nonrheumatic aortic (valve) stenosis: I35.0

## 2017-02-12 HISTORY — PX: TEE WITHOUT CARDIOVERSION: SHX5443

## 2017-02-12 HISTORY — DX: Presence of prosthetic heart valve: Z95.2

## 2017-02-12 LAB — ECHOCARDIOGRAM LIMITED
Height: 61 in
WEIGHTICAEL: 1760 [oz_av]

## 2017-02-12 LAB — POCT I-STAT, CHEM 8
BUN: 17 mg/dL (ref 6–20)
BUN: 17 mg/dL (ref 6–20)
Calcium, Ion: 1.21 mmol/L (ref 1.15–1.40)
Calcium, Ion: 1.22 mmol/L (ref 1.15–1.40)
Chloride: 107 mmol/L (ref 101–111)
Chloride: 104 mmol/L (ref 101–111)
Creatinine, Ser: 0.5 mg/dL (ref 0.44–1.00)
Creatinine, Ser: 0.5 mg/dL (ref 0.44–1.00)
Glucose, Bld: 132 mg/dL — ABNORMAL HIGH (ref 65–99)
Glucose, Bld: 101 mg/dL — ABNORMAL HIGH (ref 65–99)
HCT: 23 % — ABNORMAL LOW (ref 36.0–46.0)
HEMATOCRIT: 27 % — AB (ref 36.0–46.0)
HEMOGLOBIN: 9.2 g/dL — AB (ref 12.0–15.0)
HEMOGLOBIN: 7.8 g/dL — AB (ref 12.0–15.0)
POTASSIUM: 3.5 mmol/L (ref 3.5–5.1)
Potassium: 3.4 mmol/L — ABNORMAL LOW (ref 3.5–5.1)
SODIUM: 143 mmol/L (ref 135–145)
SODIUM: 141 mmol/L (ref 135–145)
TCO2: 24 mmol/L (ref 22–32)
TCO2: 26 mmol/L (ref 22–32)

## 2017-02-12 LAB — CBC
HCT: 26.5 % — ABNORMAL LOW (ref 36.0–46.0)
Hemoglobin: 8.4 g/dL — ABNORMAL LOW (ref 12.0–15.0)
MCH: 30.7 pg (ref 26.0–34.0)
MCHC: 31.7 g/dL (ref 30.0–36.0)
MCV: 96.7 fL (ref 78.0–100.0)
PLATELETS: 132 10*3/uL — AB (ref 150–400)
RBC: 2.74 MIL/uL — AB (ref 3.87–5.11)
RDW: 15.6 % — ABNORMAL HIGH (ref 11.5–15.5)
WBC: 7.2 10*3/uL (ref 4.0–10.5)

## 2017-02-12 LAB — TYPE AND SCREEN
ABO/RH(D): A POS
Antibody Screen: NEGATIVE

## 2017-02-12 LAB — PROTIME-INR
INR: 1.27
Prothrombin Time: 15.8 seconds — ABNORMAL HIGH (ref 11.4–15.2)

## 2017-02-12 LAB — POCT I-STAT 4, (NA,K, GLUC, HGB,HCT)
Glucose, Bld: 109 mg/dL — ABNORMAL HIGH (ref 65–99)
HEMATOCRIT: 24 % — AB (ref 36.0–46.0)
Hemoglobin: 8.2 g/dL — ABNORMAL LOW (ref 12.0–15.0)
Potassium: 3.7 mmol/L (ref 3.5–5.1)
SODIUM: 142 mmol/L (ref 135–145)

## 2017-02-12 LAB — APTT: APTT: 36 s (ref 24–36)

## 2017-02-12 SURGERY — IMPLANTATION, AORTIC VALVE, TRANSCATHETER, FEMORAL APPROACH
Anesthesia: Monitor Anesthesia Care | Site: Chest

## 2017-02-12 MED ORDER — 0.9 % SODIUM CHLORIDE (POUR BTL) OPTIME
TOPICAL | Status: DC | PRN
  Administered 2017-02-12: 4000 mL

## 2017-02-12 MED ORDER — PROPOFOL 10 MG/ML IV BOLUS
INTRAVENOUS | Status: DC | PRN
  Administered 2017-02-12: 5 mg via INTRAVENOUS
  Administered 2017-02-12: 10 mg via INTRAVENOUS

## 2017-02-12 MED ORDER — ACETAMINOPHEN 500 MG PO TABS
1000.0000 mg | ORAL_TABLET | Freq: Four times a day (QID) | ORAL | Status: DC
  Administered 2017-02-12 – 2017-02-14 (×5): 1000 mg via ORAL
  Filled 2017-02-12 (×5): qty 2

## 2017-02-12 MED ORDER — SODIUM CHLORIDE 0.9 % IV SOLN
INTRAVENOUS | Status: DC | PRN
  Administered 2017-02-12: 10:00:00 1500 mL

## 2017-02-12 MED ORDER — LACTATED RINGERS IV SOLN: 500.0000 mL | Freq: Once | INTRAVENOUS | Status: DC | PRN

## 2017-02-12 MED ORDER — CHLORHEXIDINE GLUCONATE 4 % EX LIQD: 30.0000 mL | CUTANEOUS | Status: DC

## 2017-02-12 MED ORDER — HEPARIN SODIUM (PORCINE) 1000 UNIT/ML IJ SOLN
INTRAMUSCULAR | Status: DC | PRN
  Administered 2017-02-12: 2000 [IU] via INTRAVENOUS
  Administered 2017-02-12: 8000 [IU] via INTRAVENOUS

## 2017-02-12 MED ORDER — MIDAZOLAM HCL 2 MG/2ML IJ SOLN: 2.0000 mg | Freq: Once | INTRAMUSCULAR | Status: DC

## 2017-02-12 MED ORDER — MIDAZOLAM HCL 2 MG/2ML IJ SOLN: 2.0000 mg | INTRAMUSCULAR | Status: DC | PRN

## 2017-02-12 MED ORDER — LACTATED RINGERS IV SOLN
INTRAVENOUS | Status: DC | PRN
  Administered 2017-02-12: 10:00:00 via INTRAVENOUS

## 2017-02-12 MED ORDER — FENTANYL CITRATE (PF) 100 MCG/2ML IJ SOLN
INTRAMUSCULAR | Status: AC
  Administered 2017-02-12: 50 ug via INTRAVENOUS
  Filled 2017-02-12: qty 2

## 2017-02-12 MED ORDER — NITROGLYCERIN IN D5W 200-5 MCG/ML-% IV SOLN: 0.0000 ug/min | INTRAVENOUS | Status: DC

## 2017-02-12 MED ORDER — PROTAMINE SULFATE 10 MG/ML IV SOLN
INTRAVENOUS | Status: DC | PRN
  Administered 2017-02-12: 10 mg via INTRAVENOUS
  Administered 2017-02-12: 70 mg via INTRAVENOUS

## 2017-02-12 MED ORDER — FENTANYL CITRATE (PF) 100 MCG/2ML IJ SOLN
100.0000 ug | Freq: Once | INTRAMUSCULAR | Status: AC
  Administered 2017-02-12: 50 ug via INTRAVENOUS

## 2017-02-12 MED ORDER — FENTANYL CITRATE (PF) 250 MCG/5ML IJ SOLN
INTRAMUSCULAR | Status: DC | PRN
  Administered 2017-02-12 (×3): 25 ug via INTRAVENOUS

## 2017-02-12 MED ORDER — OXYCODONE HCL 5 MG PO TABS
5.0000 mg | ORAL_TABLET | ORAL | Status: DC | PRN
  Administered 2017-02-13 (×2): 10 mg via ORAL
  Filled 2017-02-12 (×2): qty 2

## 2017-02-12 MED ORDER — IODIXANOL 320 MG/ML IV SOLN
INTRAVENOUS | Status: DC | PRN
  Administered 2017-02-12: 100.5 mL via INTRAVENOUS

## 2017-02-12 MED ORDER — SODIUM CHLORIDE 0.9 % IV SOLN: 1.5000 g | Freq: Two times a day (BID) | INTRAVENOUS | Status: DC

## 2017-02-12 MED ORDER — MORPHINE SULFATE (PF) 4 MG/ML IV SOLN
2.0000 mg | INTRAVENOUS | Status: DC | PRN
  Administered 2017-02-12: 2 mg via INTRAVENOUS
  Filled 2017-02-12: qty 1

## 2017-02-12 MED ORDER — CHLORHEXIDINE GLUCONATE 4 % EX LIQD: 60.0000 mL | Freq: Once | CUTANEOUS | Status: DC

## 2017-02-12 MED ORDER — FENTANYL CITRATE (PF) 250 MCG/5ML IJ SOLN
INTRAMUSCULAR | Status: AC
  Filled 2017-02-12: qty 5

## 2017-02-12 MED ORDER — VERAPAMIL HCL 2.5 MG/ML IV SOLN
INTRAVENOUS | Status: AC
  Filled 2017-02-12: qty 2

## 2017-02-12 MED ORDER — METOPROLOL TARTRATE 5 MG/5ML IV SOLN: 2.5000 mg | INTRAVENOUS | Status: DC | PRN

## 2017-02-12 MED ORDER — VANCOMYCIN HCL IN DEXTROSE 1-5 GM/200ML-% IV SOLN
750.0000 mg | Freq: Once | INTRAVENOUS | Status: DC
  Filled 2017-02-12 (×3): qty 200

## 2017-02-12 MED ORDER — ACETAMINOPHEN 160 MG/5ML PO SOLN: 1000.0000 mg | Freq: Four times a day (QID) | ORAL | Status: DC

## 2017-02-12 MED ORDER — CHLORHEXIDINE GLUCONATE 0.12 % MT SOLN
15.0000 mL | Freq: Once | OROMUCOSAL | Status: AC
  Administered 2017-02-12: 15 mL via OROMUCOSAL
  Filled 2017-02-12: qty 15

## 2017-02-12 MED ORDER — LIDOCAINE HCL 1 % IJ SOLN
INTRAMUSCULAR | Status: DC | PRN
  Administered 2017-02-12: 20 mL

## 2017-02-12 MED ORDER — TRAMADOL HCL 50 MG PO TABS
50.0000 mg | ORAL_TABLET | ORAL | Status: DC | PRN
  Administered 2017-02-13: 50 mg via ORAL
  Filled 2017-02-12: qty 2

## 2017-02-12 MED ORDER — SODIUM CHLORIDE 0.9 % IV SOLN
0.0000 ug/min | INTRAVENOUS | Status: DC
  Administered 2017-02-12: 20 ug/min via INTRAVENOUS
  Filled 2017-02-12: qty 2

## 2017-02-12 MED ORDER — MIDAZOLAM HCL 2 MG/2ML IJ SOLN
INTRAMUSCULAR | Status: AC
  Filled 2017-02-12: qty 2

## 2017-02-12 MED ORDER — PHENYLEPHRINE HCL 10 MG/ML IJ SOLN
INTRAMUSCULAR | Status: DC | PRN
  Administered 2017-02-12: 20 ug via INTRAVENOUS
  Administered 2017-02-12: 40 ug via INTRAVENOUS
  Administered 2017-02-12: 20 ug via INTRAVENOUS

## 2017-02-12 MED ORDER — ONDANSETRON HCL 4 MG/2ML IJ SOLN
4.0000 mg | Freq: Four times a day (QID) | INTRAMUSCULAR | Status: DC | PRN
  Administered 2017-02-14: 4 mg via INTRAVENOUS
  Filled 2017-02-12: qty 2

## 2017-02-12 MED ORDER — ONDANSETRON HCL 4 MG/2ML IJ SOLN
INTRAMUSCULAR | Status: DC | PRN
  Administered 2017-02-12: 4 mg via INTRAVENOUS

## 2017-02-12 MED ORDER — ALBUMIN HUMAN 5 % IV SOLN: 250.0000 mL | INTRAVENOUS | Status: AC | PRN

## 2017-02-12 MED ORDER — SODIUM CHLORIDE 0.9 % IV SOLN
INTRAVENOUS | Status: DC
  Administered 2017-02-12: 14:00:00 via INTRAVENOUS

## 2017-02-12 MED ORDER — PANTOPRAZOLE SODIUM 40 MG PO TBEC
40.0000 mg | DELAYED_RELEASE_TABLET | Freq: Every day | ORAL | Status: DC
  Administered 2017-02-13 – 2017-02-15 (×3): 40 mg via ORAL
  Filled 2017-02-12 (×2): qty 1

## 2017-02-12 MED ORDER — VERAPAMIL HCL 2.5 MG/ML IV SOLN
INTRAVENOUS | Status: DC | PRN
  Administered 2017-02-12: 2.5 mg via INTRAVENOUS

## 2017-02-12 MED ORDER — MORPHINE SULFATE (PF) 2 MG/ML IV SOLN: 2.0000 mg | INTRAVENOUS | Status: DC | PRN

## 2017-02-12 SURGICAL SUPPLY — 108 items
ADAPTER UNIV SWAN GANZ BIP (ADAPTER) ×1 IMPLANT
ADAPTER UNV SWAN GANZ BIP (ADAPTER) ×2
BAG BANDED W/RUBBER/TAPE 36X54 (MISCELLANEOUS) ×3 IMPLANT
BAG DECANTER FOR FLEXI CONT (MISCELLANEOUS) IMPLANT
BAG SNAP BAND KOVER 36X36 (MISCELLANEOUS) ×6 IMPLANT
BALLN TRUE 22X4.5 (BALLOONS) ×2
BALLN TRUE 22X4.5CM (BALLOONS) ×1
BALLOON TRUE 22X4.5 (BALLOONS) ×1 IMPLANT
BLADE CLIPPER SURG (BLADE) IMPLANT
BLADE OSCILLATING /SAGITTAL (BLADE) IMPLANT
BLADE STERNUM SYSTEM 6 (BLADE) ×6 IMPLANT
CABLE ADAPT CONN TEMP 6FT (ADAPTER) ×3 IMPLANT
CANNULA FEM VENOUS REMOTE 22FR (CANNULA) IMPLANT
CANNULA OPTISITE PERFUSION 16F (CANNULA) IMPLANT
CANNULA OPTISITE PERFUSION 18F (CANNULA) IMPLANT
CATH DIAG EXPO 6F VENT PIG 145 (CATHETERS) ×6 IMPLANT
CATH EXPO 5FR AL1 (CATHETERS) ×3 IMPLANT
CATH INFINITI 6F AL2 (CATHETERS) ×3 IMPLANT
CATH S G BIP PACING (SET/KITS/TRAYS/PACK) ×3 IMPLANT
CATH VISTA GUIDE 6FR XBLAD3.5 (CATHETERS) ×3 IMPLANT
CHLORAPREP W/TINT 26ML (MISCELLANEOUS) ×3 IMPLANT
CLIP VESOCCLUDE MED 24/CT (CLIP) IMPLANT
CLIP VESOCCLUDE SM WIDE 24/CT (CLIP) IMPLANT
CONT SPEC 4OZ CLIKSEAL STRL BL (MISCELLANEOUS) ×6 IMPLANT
COVER BACK TABLE 24X17X13 BIG (DRAPES) ×3 IMPLANT
COVER BACK TABLE 60X90IN (DRAPES) ×3 IMPLANT
COVER BACK TABLE 80X110 HD (DRAPES) ×3 IMPLANT
COVER DOME SNAP 22 D (MISCELLANEOUS) ×3 IMPLANT
COVER PROBE W GEL 5X96 (DRAPES) ×3 IMPLANT
CRADLE DONUT ADULT HEAD (MISCELLANEOUS) ×3 IMPLANT
DERMABOND ADHESIVE PROPEN (GAUZE/BANDAGES/DRESSINGS) ×2
DERMABOND ADVANCED (GAUZE/BANDAGES/DRESSINGS) ×2
DERMABOND ADVANCED .7 DNX12 (GAUZE/BANDAGES/DRESSINGS) ×1 IMPLANT
DERMABOND ADVANCED .7 DNX6 (GAUZE/BANDAGES/DRESSINGS) ×1 IMPLANT
DEVICE CLOSURE PERCLS PRGLD 6F (VASCULAR PRODUCTS) ×2 IMPLANT
DEVICE RAD COMP TR BAND LRG (VASCULAR PRODUCTS) ×3 IMPLANT
DRAPE BRACHIAL (DRAPES) ×3 IMPLANT
DRAPE INCISE IOBAN 66X45 STRL (DRAPES) IMPLANT
DRAPE SLUSH MACHINE 52X66 (DRAPES) ×3 IMPLANT
DRSG TEGADERM 4X4.75 (GAUZE/BANDAGES/DRESSINGS) ×9 IMPLANT
ELECT REM PT RETURN 9FT ADLT (ELECTROSURGICAL) ×6
ELECTRODE REM PT RTRN 9FT ADLT (ELECTROSURGICAL) ×2 IMPLANT
FELT TEFLON 6X6 (MISCELLANEOUS) IMPLANT
FEMORAL VENOUS CANN RAP (CANNULA) IMPLANT
GAUZE SPONGE 4X4 12PLY STRL (GAUZE/BANDAGES/DRESSINGS) ×3 IMPLANT
GLIDESHEATH SLEND SS 6F .021 (SHEATH) ×3 IMPLANT
GLOVE BIO SURGEON STRL SZ 6.5 (GLOVE) ×4 IMPLANT
GLOVE BIO SURGEON STRL SZ7.5 (GLOVE) ×3 IMPLANT
GLOVE BIO SURGEON STRL SZ8 (GLOVE) IMPLANT
GLOVE BIO SURGEONS STRL SZ 6.5 (GLOVE) ×2
GLOVE BIOGEL PI IND STRL 6.5 (GLOVE) ×2 IMPLANT
GLOVE BIOGEL PI INDICATOR 6.5 (GLOVE) ×4
GLOVE EUDERMIC 7 POWDERFREE (GLOVE) ×3 IMPLANT
GLOVE INDICATOR 8.5 STRL (GLOVE) ×3 IMPLANT
GLOVE ORTHO TXT STRL SZ7.5 (GLOVE) IMPLANT
GOWN STRL REUS W/ TWL LRG LVL3 (GOWN DISPOSABLE) ×4 IMPLANT
GOWN STRL REUS W/ TWL XL LVL3 (GOWN DISPOSABLE) ×5 IMPLANT
GOWN STRL REUS W/TWL LRG LVL3 (GOWN DISPOSABLE) ×8
GOWN STRL REUS W/TWL XL LVL3 (GOWN DISPOSABLE) ×10
GUIDEWIRE INQWIRE 1.5J.035X260 (WIRE) ×1 IMPLANT
GUIDEWIRE SAFE TJ AMPLATZ EXST (WIRE) ×3 IMPLANT
GUIDEWIRE STRAIGHT .035 260CM (WIRE) ×3 IMPLANT
INQWIRE 1.5J .035X260CM (WIRE) ×3
INSERT FOGARTY SM (MISCELLANEOUS) IMPLANT
KIT BASIN OR (CUSTOM PROCEDURE TRAY) ×3 IMPLANT
KIT DILATOR VASC 18G NDL (KITS) IMPLANT
KIT ENCORE 26 ADVANTAGE (KITS) ×3 IMPLANT
KIT HEART LEFT (KITS) ×9 IMPLANT
KIT ROOM TURNOVER OR (KITS) ×3 IMPLANT
KIT SUCTION CATH 14FR (SUCTIONS) IMPLANT
NEEDLE 22X1 1/2 (OR ONLY) (NEEDLE) IMPLANT
NEEDLE HYPO 25GX1X1/2 BEV (NEEDLE) ×3 IMPLANT
NEEDLE PERC 18GX7CM (NEEDLE) ×3 IMPLANT
NS IRRIG 1000ML POUR BTL (IV SOLUTION) ×12 IMPLANT
PACK AORTA (CUSTOM PROCEDURE TRAY) ×3 IMPLANT
PAD ARMBOARD 7.5X6 YLW CONV (MISCELLANEOUS) ×6 IMPLANT
PAD ELECT DEFIB RADIOL ZOLL (MISCELLANEOUS) ×3 IMPLANT
PERCLOSE PROGLIDE 6F (VASCULAR PRODUCTS) ×6
SET MICROPUNCTURE 5F STIFF (MISCELLANEOUS) ×3 IMPLANT
SHEATH PINNACLE 6F 10CM (SHEATH) ×6 IMPLANT
SHEATH PINNACLE 8F 10CM (SHEATH) ×3 IMPLANT
SLEEVE REPOSITIONING LENGTH 30 (MISCELLANEOUS) ×3 IMPLANT
SPONGE LAP 4X18 X RAY DECT (DISPOSABLE) IMPLANT
STENT SIERRA 4.00 X 12 MM (Permanent Stent) IMPLANT
STOPCOCK MORSE 400PSI 3WAY (MISCELLANEOUS) ×9 IMPLANT
SUT ETHIBOND X763 2 0 SH 1 (SUTURE) IMPLANT
SUT GORETEX CV 4 TH 22 36 (SUTURE) IMPLANT
SUT GORETEX CV4 TH-18 (SUTURE) IMPLANT
SUT MNCRL AB 3-0 PS2 18 (SUTURE) IMPLANT
SUT PROLENE 5 0 C 1 36 (SUTURE) IMPLANT
SUT PROLENE 6 0 C 1 30 (SUTURE) ×6 IMPLANT
SUT SILK  1 MH (SUTURE) ×2
SUT SILK 1 MH (SUTURE) ×1 IMPLANT
SUT VIC AB 2-0 CT1 27 (SUTURE) ×2
SUT VIC AB 2-0 CT1 TAPERPNT 27 (SUTURE) ×1 IMPLANT
SUT VIC AB 2-0 CTX 36 (SUTURE) IMPLANT
SUT VIC AB 3-0 SH 8-18 (SUTURE) IMPLANT
SYR 10ML LL (SYRINGE) ×12 IMPLANT
SYR 30ML LL (SYRINGE) ×6 IMPLANT
SYR 50ML LL SCALE MARK (SYRINGE) ×6 IMPLANT
SYR CONTROL 10ML LL (SYRINGE) IMPLANT
TOWEL GREEN STERILE (TOWEL DISPOSABLE) ×6 IMPLANT
TRANSDUCER W/STOPCOCK (MISCELLANEOUS) ×9 IMPLANT
TRAY FOLEY SILVER 16FR TEMP (SET/KITS/TRAYS/PACK) IMPLANT
TUBING HIGH PRESS EXTEN 6IN (TUBING) ×3 IMPLANT
VALVE HEART TRANSCATH SZ3 23MM (Prosthesis & Implant Heart) ×3 IMPLANT
WIRE AMPLATZ SS-J .035X180CM (WIRE) ×3 IMPLANT
WIRE COUGAR XT STRL 190CM (WIRE) ×3 IMPLANT

## 2017-02-12 NOTE — Anesthesia Procedure Notes (Addendum)
Central Venous Catheter Insertion Performed by: Dorris SinghGreen, Bernard Donahoo, MD, anesthesiologist Start/End2/12/2017 9:30 AM, 02/12/2017 9:45 AM Preanesthetic checklist: patient identified, IV checked, site marked, risks and benefits discussed, surgical consent, monitors and equipment checked, pre-op evaluation and timeout performed Position: Trendelenburg Lidocaine 1% used for infiltration and patient sedated Hand hygiene performed , maximum sterile barriers used  and Seldinger technique used Central line was placed.Double lumen Procedure performed using ultrasound guided technique. Ultrasound Notes:anatomy identified Attempts: 1 Following insertion, line sutured, dressing applied and Biopatch. Post procedure assessment: blood return through all ports  Patient tolerated the procedure well with no immediate complications.

## 2017-02-12 NOTE — Anesthesia Procedure Notes (Signed)
Arterial Line Insertion Start/End2/12/2017 9:30 AM, 02/12/2017 9:50 AM Performed by: Adair LaundryPaxton, Chenel Wernli A, CRNA, CRNA  Patient location: Pre-op. Preanesthetic checklist: patient identified, IV checked, risks and benefits discussed, surgical consent and pre-op evaluation Lidocaine 1% used for infiltration and patient sedated Right, radial was placed Catheter size: 20 G Hand hygiene performed , maximum sterile barriers used  and Seldinger technique used  Attempts: 1 Procedure performed without using ultrasound guided technique. Following insertion, dressing applied and Biopatch. Post procedure assessment: normal  Patient tolerated the procedure well with no immediate complications.

## 2017-02-12 NOTE — Progress Notes (Signed)
Paged cardiology Dr. Cristina GongVedre regarding discrepancy in a-line and cuff BPs. A-line reading SBP 170s-180s, cuff SBP reading 110s-120s. MAPs correlating. Per Dr. Cristina GongVedre don't start nitro gtt and rely on cuff pressure for readings.

## 2017-02-12 NOTE — Progress Notes (Signed)
  Echocardiogram 2D Echocardiogram has been performed.  Janalyn HarderWest, Maaz Spiering R 02/12/2017, 1:22 PM

## 2017-02-12 NOTE — H&P (Signed)
[address]       [place] [id number]             [phone]      Cardiothoracic Surgery Admission History and Physical   Referring Provider is Iran OuchArida, Muhammad A, MD  PCP is Marguarite ArbourSparks, Jeffrey D, MD      Reason for admission: Severe aortic stenosis    HPI:   The patient is an 84 year old woman with a history of iron deficiency anemia, a several year history of GI bleeding due to AVMs, osteoporosis with multiple fractures from falls, and severe aortic stenosis who has been referred for consideration of aortic valve replacement. She has been followed by Dr. Kirke CorinArida for aortic stenosis with periodic echocardiograms. Her echocardiogram on 08/12/2015 showed moderate aortic stenosis with a mean gradient of 28 mmHg and normal left ventricular systolic function. A follow-up echocardiogram on 08/28/2016 showed progression to severe aortic stenosis with a mean gradient of 65 mm peak gradient of 106 mmHg. She was seen in the emergency department at [facility] in September 2018 with headache and dizziness. A CTA of the head showed a small (2 mm) intracranial aneurysm and mild carotid disease. She was seen by neurosurgery and no further workup was recommended. She also was noted to have a 10 mm nodular lesion in the vallecula which was evaluated by ENT and no further workup was recommended. She was having progressive shortness of breath and fatigue with generalized weakness at this time and was referred to Dr. Clifton JamesMcAlhany for consideration of TAVR. She underwent cardiac catheterization on 10/05/2016 which showed no evidence of coronary artery disease. There was severe aortic stenosis with a mean gradient of 42 mmHg, a peak to peak gradient of 52 mmHg, and a calculated aortic valve area of 0.69 cm. She was started on aspirin 81 mg daily after the catheterization with her history of AVMs and bleeding to see if she would tolerate that. Unfortunately she was admitted to [facility] on 10/12/1998 1:18 week of  aspirin therapy with weakness and melena with a drop in her hemoglobin from 11.9-6.1. The aspirin was stopped. She underwent EGD on 10/12/2016 showing gastritis only. Gastroenterology thought that her bleeding was most likely from her AVMs. She was transfused 3 units of PRBCs and her hemoglobin increased to 10 prior to discharge. Her melena resolved off of aspirin but she was readmitted on 10/26/2016 with recurrent GI bleeding with a hemoglobin of 7.0. She was transfused and had upper endoscopy and was subsequently treated for H. pylori. Her bleeding was again felt to be due to AVMs. Then she was readmitted to [facility] on 12/21/2016 with recurrent anemia and GI bleeding with a hemoglobin of 6.9. She was transfused and discharged home. She had had a capsule study in May 2018 that reportedly showed obvious AVMs. Since her admission in December 2018 she has not had any further obvious GI bleeding and no melena. According to her son and daughter who are here with her today her hemoglobin has been stable around 11 on the last 3 checks. She continues to have exertional shortness of breath and fatigue. She said that she gets short of breath walking around her house unless she moves slowly. She has shortness of breath when bending over to lift up anything. She denies any chest pain or pressure. She has not had any recent dizziness. She denies peripheral edema. She denies orthopnea and PND.  She is divorced and lives alone in an apartment in [place]. She  is able to take care of herself said that she does not have enough energy to clean her house. She has a small dog that she still walks. She has a son and daughter who keep a close eye on her and are very devoted. She uses a walker to help her with balance since she has had multiple falls in the past. She has had both hips replaced due to falls. She has had fractures of both wrist due to falls. She has had vertebral compression fractures related to activity.       Past  Medical History:  Diagnosis Date  . Aortic stenosis    a. TTE 8/17: nl EF, mod to sev AS w/ mean gradient 28 mmHg, valve area 0.97, mod pulm HTN, small pericardial effusion  . Arrhythmia   . Asthma   . AVM (arteriovenous malformation)    a. small intestine   . Broken back   . Broken wrist   . Hip fracture (HCC)    a. in the setting of mechanical fall  . Iron deficiency anemia    a. requiring periodic transfusions  . Syncope and collapse         Past Surgical History:  Procedure Laterality Date  . APPENDECTOMY    . BACK SURGERY     back fusion  . ESOPHAGOGASTRODUODENOSCOPY (EGD) WITH PROPOFOL N/A 11/15/2015   Procedure: ESOPHAGOGASTRODUODENOSCOPY (EGD) WITH PROPOFOL; Surgeon: [name], MD; Location: [facility] ENDOSCOPY; Service: Endoscopy; Laterality: N/A;  . ESOPHAGOGASTRODUODENOSCOPY (EGD) WITH PROPOFOL N/A 10/12/2016   Procedure: ESOPHAGOGASTRODUODENOSCOPY (EGD) WITH PROPOFOL; Surgeon: [name], MD; Location: [facility] ENDOSCOPY; Service: Endoscopy; Laterality: N/A;  . FRACTURE SURGERY    . GIVENS CAPSULE STUDY N/A 02/20/2016   Procedure: GIVENS CAPSULE STUDY; Surgeon: Scot Jun, MD; Location: [facility] ENDOSCOPY; Service: Endoscopy; Laterality: N/A;  . HIP FRACTURE SURGERY    . INTRAMEDULLARY (IM) NAIL INTERTROCHANTERIC Right 11/18/2015   Procedure: INTRAMEDULLARY (IM) NAIL INTERTROCHANTRIC; Surgeon: Juanell Fairly, MD; Location: [facility] ORS; Service: Orthopedics; Laterality: Right;  . KYPHOPLASTY N/A 08/19/2014   Procedure: KYPHOPLASTY; Surgeon: Kennedy Bucker, MD; Location: [facility] ORS; Service: Orthopedics; Laterality: N/A;  . PARTIAL HYSTERECTOMY    . RIGHT/LEFT HEART CATH AND CORONARY ANGIOGRAPHY N/A 10/05/2016   Procedure: RIGHT/LEFT HEART CATH AND CORONARY ANGIOGRAPHY; Surgeon: Kathleene Hazel, MD; Location: [facility] INVASIVE CV LAB; Service: Cardiovascular; Laterality: N/A;  . TUBAL LIGATION          Family History  Problem Relation Age of Onset  . Heart block Mother   .  Heart failure Father   . Heart disease Sister   . Breast cancer Maternal Aunt    Social History        Socioeconomic History  . Marital status: Divorced    Spouse name: Not on file  . Number of children: Not on file  . Years of education: Not on file  . Highest education level: Not on file  Social Needs  . Financial resource strain: Not on file  . Food insecurity - worry: Not on file  . Food insecurity - inability: Not on file  . Transportation needs - medical: Not on file  . Transportation needs - non-medical: Not on file  Occupational History  . Not on file  Tobacco Use  . Smoking status: Never Smoker  . Smokeless tobacco: Never Used  Substance and Sexual Activity  . Alcohol use: No  . Drug use: No  . Sexual activity: Not on file  Other Topics Concern  . Not on file  Social History Narrative  . Not on file         Current Outpatient Medications  Medication Sig Dispense Refill  . acetaminophen (TYLENOL) 325 MG tablet Take 325 mg by mouth every 6 (six) hours as needed (for pain.).    Marland Kitchen cholecalciferol (VITAMIN D) 400 units TABS tablet Take 800 Units by mouth daily.     . vitamin B-12 (CYANOCOBALAMIN) 1000 MCG tablet Take 1,000 mcg by mouth daily.     No current facility-administered medications for this visit.         Allergies  Allergen Reactions  . Aspirin     GI bleeding   . Penicillins Rash and Other (See Comments)    Has patient had a PCN reaction causing immediate rash, facial/tongue/throat swelling, SOB or lightheadedness with hypotension: Yes  Has patient had a PCN reaction causing severe rash involving mucus membranes or skin necrosis: No  Has patient had a PCN reaction that required hospitalization: No  Has patient had a PCN reaction occurring within the last 10 years: No  If all of the above answers are "NO", then may proceed with Cephalosporin use.    Review of Systems:   General: normal appetite, decreased energy, no weight gain, has weight loss,  no fever  Cardiac: no chest pain with exertion, no chest pain at rest, has SOB with mild exertion, no resting SOB, no PND, no orthopnea, has palpitations, no arrhythmia, no atrial fibrillation, no LE edema, has had dizzy spells, no syncope  Respiratory: exertional shortness of breath, no home oxygen, no productive cough, no dry cough, no bronchitis, no wheezing, no hemoptysis, has asthma, no pain with inspiration or cough, no sleep apnea, no CPAP at night  GI: no difficulty swallowing, no reflux, has frequent heartburn, no hiatal hernia, no abdominal pain, no constipation, no diarrhea, no hematochezia, no hematemesis, has had melena but not recently  GU: no dysuria, no frequency, no urinary tract infection, no hematuria, no kidney stones, no kidney disease  Vascular: no pain suggestive of claudication, no pain in feet, no leg cramps, no varicose veins, no DVT, no non-healing foot ulcer  Neuro: no stroke, no TIA's, no seizures, has had headaches, no temporary blindness one eye, no slurred speech, no peripheral neuropathy, no chronic pain, has instability of gait, no memory/cognitive dysfunction  Musculoskeletal: has arthritis, no joint swelling, no myalgias, some difficulty walking, reduced mobility  Skin: no rash, no itching, no skin infections, no pressure sores or ulcerations  Psych: no anxiety, no depression, no nervousness, no unusual recent stress  Eyes: no blurry vision, no floaters, no recent vision changes, wears glasses or contacts  ENT: has hearing loss, no loose or painful teeth, no dentures, last saw dentist 01/28/2017 for filling  Hematologic: no easy bruising, no abnormal bleeding, no clotting disorder, no frequent epistaxis  Endocrine: no diabetes, does not check CBG's at home    Physical Exam:   BP (!) 157/67  Pulse 87  Resp 20  Ht 5\' 5"  (1.651 m)  Wt 110 lb (49.9 kg)  SpO2 98% Comment: RA  BMI 18.30 kg/m  General: Elderly but well-appearing  HEENT: Unremarkable, NCAT,  PERLA, EOMI, oropharynx clear, teeth in good condition for her age.  Neck: no JVD, left cervical bruit, no adenopathy or thyromegaly  Chest: clear to auscultation, symmetrical breath sounds, no wheezes, no rhonchi  CV: RRR, grade IV/VI crescendo/decrescendo murmur heard best at RSB, no diastolic murmur  Abdomen: soft, non-tender, no masses or organomegaly  Extremities: warm,  well-perfused, pulses palpable, no LE edema  Rectal/GU Deferred  Neuro: Grossly non-focal and symmetrical throughout  Skin: Clean and dry, no rashes, no breakdown    Diagnostic Tests:   Result status: Final result  *[facility]*  [address]  [place], [place] [address]  [phone]  -------------------------------------------------------------------  Transthoracic Echocardiography  Patient: Yvone, Slape  MR #: [id number]  Study Date: 08/28/2016  Gender: F  Age: 5  Height: 165.1 cm  Weight: 51.7 kg  BSA: 1.53 m^2  Pt. Status:  Room:  ATTENDING Lavonne Chick, Ryan M  REFERRING Sondra Barges  PERFORMING [place],   SONOGRAPHER Quentin Ore, RVT, RDCS, RDMS  cc:  -------------------------------------------------------------------  LV EF: 60% - 65%  -------------------------------------------------------------------  History: PMH: Dyspnea and murmur. Aortic valve disease. Risk  factors: Nonsmoker.  -------------------------------------------------------------------  Study Conclusions  - Left ventricle: The cavity size was normal. There was mild  concentric hypertrophy. Systolic function was normal. The  estimated ejection fraction was in the range of 60% to 65%. Wall  motion was normal; there were no regional wall motion  abnormalities. Doppler parameters are consistent with abnormal  left ventricular relaxation (grade 1 diastolic dysfunction).  - Aortic valve: There was severe stenosis. Mean gradient (S): 65 mm  Hg. Valve area (VTI): 0.75 cm^2.  - Mitral  valve: Valve area by pressure half-time: 2.06 cm^2.  - Pulmonary arteries: Systolic pressure was mildly increased. PA  peak pressure: 38 mm Hg (S).  Impressions:  - Aortic stenosis worsened since last year.  -------------------------------------------------------------------  Study data: The previous study was not available, so comparison  was made to the report of August 2017. Study status: Routine.  Procedure: Transthoracic echocardiography. Image quality was fair.  The study was technically difficult, as a result of poor acoustic  windows. Study completion: There were no complications.  Transthoracic echocardiography. M-mode, complete 2D, spectral  Doppler, and color Doppler. Birthdate: Patient birthdate:  13-Jan-1930. Age: Patient is 84 yr old. Sex: Gender: female.  BMI: 19 kg/m^2. Blood pressure: 138/60 Patient status:  Outpatient. Study date: Study date: 08/28/2016. Study time: 01:33  PM.  -------------------------------------------------------------------  -------------------------------------------------------------------  Left ventricle: The cavity size was normal. There was mild  concentric hypertrophy. Systolic function was normal. The estimated  ejection fraction was in the range of 60% to 65%. Wall motion was  normal; there were no regional wall motion abnormalities. Doppler  parameters are consistent with abnormal left ventricular relaxation  (grade 1 diastolic dysfunction).  -------------------------------------------------------------------  Aortic valve: Trileaflet; mildly thickened, severely calcified  leaflets. Doppler: There was severe stenosis. There was no  regurgitation. VTI ratio of LVOT to aortic valve: 0.27. Valve  area (VTI): 0.75 cm^2. Indexed valve area (VTI): 0.49 cm^2/m^2.  Mean velocity ratio of LVOT to aortic valve: 0.25. Valve area  (Vmean): 0.71 cm^2. Indexed valve area (Vmean): 0.46 cm^2/m^2.  Mean gradient (S): 65 mm Hg. Peak gradient (S): 106 mm  Hg.  -------------------------------------------------------------------  Aorta: Aortic root: The aortic root was normal in size.  -------------------------------------------------------------------  Mitral valve: Structurally normal valve. Mobility was not  restricted. Doppler: Transvalvular velocity was within the normal  range. There was no evidence for stenosis. There was no  regurgitation. Valve area by pressure half-time: 2.06 cm^2.  Indexed valve area by pressure half-time: 1.34 cm^2/m^2. Peak  gradient (D): 4 mm Hg.  -------------------------------------------------------------------  Left atrium: The atrium was normal in size.  -------------------------------------------------------------------  Right ventricle: The cavity size was normal. Wall thickness  was  normal. Systolic function was normal.  -------------------------------------------------------------------  Pulmonic valve: Doppler: Transvalvular velocity was within the  normal range. There was no evidence for stenosis.  -------------------------------------------------------------------  Tricuspid valve: Structurally normal valve. Doppler:  Transvalvular velocity was within the normal range. There was mild  regurgitation.  -------------------------------------------------------------------  Pulmonary artery: The main pulmonary artery was normal-sized.  Systolic pressure was mildly increased.  -------------------------------------------------------------------  Right atrium: The atrium was normal in size.  -------------------------------------------------------------------  Pericardium: There was no pericardial effusion.  -------------------------------------------------------------------  Systemic veins:  Inferior vena cava: Poorly visualized. The vessel was normal in  size.  -------------------------------------------------------------------  Measurements  Left ventricle Value Reference  LV ID, ED, PLAX chordal  (L) 32 mm 43 - 52  LV ID, ES, PLAX chordal (L) 19 mm 23 - 38  LV fx shortening, PLAX chordal 41 % >=29  LV PW thickness, ED 11 mm ----------  IVS/LV PW ratio, ED 1 <=1.3  Stroke volume, 2D 93 ml ----------  Stroke volume/bsa, 2D 61 ml/m^2 ----------  LV e&', lateral 10.3 cm/s ----------  LV E/e&', lateral 9.09 ----------  LV e&', medial 5.87 cm/s ----------  LV E/e&', medial 15.95 ----------  LV e&', average 8.09 cm/s ----------  LV E/e&', average 11.58 ----------  Ventricular septum Value Reference  IVS thickness, ED 11 mm ----------  LVOT Value Reference  LVOT ID, S 19 mm ----------  LVOT area 2.84 cm^2 ----------  LVOT ID 19 mm ----------  LVOT mean velocity, S 94.1 cm/s ----------  LVOT VTI, S 32.9 cm ----------  Stroke volume (SV), LVOT DP 93.3 ml ----------  Stroke index (SV/bsa), LVOT DP 60.8 ml/m^2 ----------  Aortic valve Value Reference  Aortic valve peak velocity, S 515 cm/s ----------  Aortic valve mean velocity, S 378 cm/s ----------  Aortic valve VTI, S 124 cm ----------  Aortic mean gradient, S 65 mm Hg ----------  Aortic peak gradient, S 106 mm Hg ----------  VTI ratio, LVOT/AV 0.27 ----------  Aortic valve area, VTI 0.75 cm^2 ----------  Aortic valve area/bsa, VTI 0.49 cm^2/m^2 ----------  Velocity ratio, mean, LVOT/AV 0.25 ----------  Aortic valve area, mean velocity 0.71 cm^2 ----------  Aortic valve area/bsa, mean 0.46 cm^2/m^2 ----------  velocity  Aorta Value Reference  Aortic root ID, ED 26 mm ----------  Ascending aorta ID, A-P, S 28 mm ----------  Left atrium Value Reference  LA ID, A-P, ES 28 mm ----------  LA ID/bsa, A-P 1.83 cm/m^2 <=2.2  LA volume, ES, 1-p A4C 31.4 ml ----------  LA volume/bsa, ES, 1-p A4C 20.5 ml/m^2 ----------  LA volume, ES, 1-p A2C 47.6 ml ----------  LA volume/bsa, ES, 1-p A2C 31 ml/m^2 ----------  Mitral valve Value Reference  Mitral E-wave peak velocity 93.6 cm/s ----------  Mitral A-wave peak velocity 121 cm/s  ----------  Mitral deceleration time (H) 366 ms 150 - 230  Mitral pressure half-time 107 ms ----------  Mitral peak gradient, D 4 mm Hg ----------  Mitral E/A ratio, peak 0.8 ----------  Mitral valve area, PHT, DP 2.06 cm^2 ----------  Mitral valve area/bsa, PHT, DP 1.34 cm^2/m^2 ----------  Pulmonary arteries Value Reference  PA pressure, S, DP (H) 38 mm Hg <=30  Tricuspid valve Value Reference  Tricuspid regurg peak velocity 286 cm/s ----------  Tricuspid peak RV-RA gradient 33 mm Hg ----------  Right atrium Value Reference  RA ID, S-I, ES, A4C 40.9 mm 34 - 49  RA area, ES, A4C 9.17 cm^2 8.3 - 19.5  RA volume, ES, A/L 16.2 ml ----------  RA volume/bsa, ES, A/L 10.6 ml/m^2 ----------  Right ventricle Value Reference  TAPSE 26.2 mm ----------  RV s&', lateral, S 12.7 cm/s ----------  Pulmonic valve Value Reference  Pulmonic valve peak velocity, S 123 cm/s ----------  Legend:  (L) and (H) mark values outside specified reference range.  -------------------------------------------------------------------  Prepared and Electronically Authenticated by  Lorine Bears, MD  2018-08-28T18:16:44    Procedures  RIGHT/LEFT HEART CATH AND CORONARY ANGIOGRAPHY  Conclusion  1. No angiographic evidence of CAD  2. Severe aortic valve stenosis (mean gradient 42.4 mmHg, peak to peak gradient 52 mmHg, AVA 0.69 cm2).  Recommendations: Will continue with workup for TAVR  Indications  Severe aortic stenosis [I35.0 (ICD-10-CM)]  Procedural Details/Technique  Technical Details Indication: 84 yo female with severe AS.  Procedure: The risks, benefits, complications, treatment options, and expected outcomes were discussed with the patient. The patient and/or family concurred with the proposed plan, giving informed consent. The patient was brought to the cath lab after IV hydration was given. The patient was further sedated with Versed. The right groin was prepped and draped. 1% lidocaine used for local  anesthesia. 7 French sheath placed in there right femoral vein under u/s guidance. Right heart cath performed with balloon tipped catheter. The right wrist was prepped and draped in a sterile fashion. 1% lidocaine was used for local anesthesia. Using the modified Seldinger access technique, a 5 French sheath was placed in the right radial artery. 3 mg Verapamil was given through the sheath. 2500 units IV heparin was given. Standard diagnostic catheters were used to perform selective coronary angiography. I crossed the aortic valve with an AL-1 catheter and a straight wire. The sheath was removed from the right radial artery and a Terumo hemostasis band was applied at the arteriotomy site on the right wrist.      Estimated blood loss <50 mL.  During this procedure the patient was administered the following to achieve and maintain moderate conscious sedation: Versed 1 mg, while the patient's heart rate, blood pressure, and oxygen saturation were continuously monitored. The period of conscious sedation was 52 minutes, of which I was present face-to-face 100% of this time.  Complications  Complications documented before study signed (10/05/2016 12:58 PM EDT)   RIGHT/LEFT HEART CATH AND CORONARY ANGIOGRAPHY   None Documented by Kathleene Hazel, MD 10/05/2016 12:36 PM EDT  Time Range: Intra-procedure    Coronary Findings  Diagnostic  Dominance: Right  Left Anterior Descending  Vessel is large. Vessel is angiographically normal.  First Diagonal Branch  Vessel is large in size.  First Septal Branch  Vessel is small in size.  Second Diagonal Branch  Vessel is small in size.  Second Septal Branch  Vessel is small in size.  Third Diagonal Branch  Vessel is small in size.  Third Septal Branch  Vessel is small in size.  Left Circumflex  Vessel is large. Vessel is angiographically normal.  Right Coronary Artery  Vessel is large. Vessel is angiographically normal.  Intervention  No  interventions have been documented.  Left Heart  Aortic Valve There is severe aortic valve stenosis. The aortic valve is calcified.  Coronary Diagrams  Diagnostic Diagram     Implants     No implant documentation for this case.  MERGE Images  Link to Procedure Log   Show images for CARDIAC CATHETERIZATION Procedure Log  Hemo Data   Most Recent Value  Fick Cardiac Output 5.54 L/min  Fick Cardiac Output Index 3.57 (L/min)/BSA  Aortic Mean Gradient 42.4 mmHg  Aortic Peak Gradient 52 mmHg  Aortic Valve Area 0.69  Aortic Value Area Index 0.45 cm2/BSA  RA A Wave 2 mmHg  RA V Wave 2 mmHg  RA Mean 1 mmHg  RV Systolic Pressure 23 mmHg  RV Diastolic Pressure 2 mmHg  RV EDP 4 mmHg  PA Systolic Pressure 22 mmHg  PA Diastolic Pressure 10 mmHg  PA Mean 15 mmHg  PW A Wave 13 mmHg  PW V Wave 13 mmHg  PW Mean 8 mmHg  AO Systolic Pressure 125 mmHg  AO Diastolic Pressure 54 mmHg  AO Mean 85 mmHg  LV Systolic Pressure 208 mmHg  LV Diastolic Pressure 5 mmHg  LV EDP 17 mmHg  Arterial Occlusion Pressure Extended Systolic Pressure 166 mmHg  Arterial Occlusion Pressure Extended Diastolic Pressure 75 mmHg  Arterial Occlusion Pressure Extended Mean Pressure 116 mmHg  Left Ventricular Apex Extended Systolic Pressure 218 mmHg  Left Ventricular Apex Extended Diastolic Pressure 7 mmHg  Left Ventricular Apex Extended EDP Pressure 17 mmHg  QP/QS 1  TPVR Index 4.2 HRUI  TSVR Index 23.79 HRUI  PVR SVR Ratio 0.08  TPVR/TSVR Ratio 0.18     ADDENDUM REPORT: 01/29/2017 13:58  CLINICAL DATA: 84 year old female with severe aortic stenosis being  evaluated for a TAVR procedure.  EXAM:  Cardiac TAVR CT  TECHNIQUE:  The patient was scanned on a [employer]. A 120 kV  retrospective scan was triggered in the descending thoracic aorta at  111 HU's. Gantry rotation speed was 250 msecs and collimation was .6  mm. No beta blockade or nitro were given. The 3D data set was  reconstructed in 5%  intervals of the R-R cycle. Systolic and  diastolic phases were analyzed on a dedicated work station using  MPR, MIP and VRT modes. The patient received 80 cc of contrast.  FINDINGS:  Aortic Valve: Severely thickened and calcified aortic valve with  severely restricted leaflet opening and minimal calcifications  extending into the LVOT under the non-coronary leaflet.  Aorta: Normal size, mild diffuse calcifications and no dissection.  Sinotubular Junction: 25 x 23 mm  Ascending Thoracic Aorta: 28 x 27 mm  Aortic Arch: 26 x 24 mm  Descending Thoracic Aorta: 22 x 22 mm  Sinus of Valsalva Measurements:  Non-coronary: 28 mm  Right -coronary: 25 mm  Left -coronary: 27 mm  Sinus of Valsalva Height:  Right -coronary: 28 mm  Left -coronary: 19 mm  Coronary Artery Height above Annulus:  Left Main: 10 mm  Right Coronary: 11 mm  Virtual Basal Annulus Measurements:  Maximum/Minimum Diameter: 24.4 x 19.7 mm  Mean Diameter: 22.0 mm  Perimeter: 71.4 mm  Area: 379 mm2  Optimum Fluoroscopic Angle for Delivery: LAO 19 CAU 17.  IMPRESSION:  1. Severely thickened and calcified aortic valve with severely  restricted leaflet opening and minimal calcifications extending into  the LVOT under the non-coronary leaflet. Annular measurements  suitable for delivery of a 23 mm Edwards-SAPIEN 3 valve and a 26 mm  Evolut Pro valve.  2. Sufficient coronary to annulus distance.  3. Optimum Fluoroscopic Angle for Delivery: LAO 19 CAU 17.  4. No thrombus in the left atrial appendage.  Electronically Signed  By: Tobias Alexander  On: 01/29/2017 13:58   Addended by Lars Masson, MD on 01/29/2017 2:00 PM    Study Result   EXAM:  OVER-READ INTERPRETATION CT CHEST  The following report is an over-read performed by radiologist Dr.  Trudie Reed  of [facility], [place] on 01/29/2017. This  over-read does not include interpretation of cardiac or coronary  anatomy or pathology. The coronary calcium  score/coronary CTA  interpretation by the cardiologist is attached.  COMPARISON: None.  FINDINGS:  Extracardiac findings will be described under dictation for  contemporaneously obtained CTA chest, abdomen and pelvis.  IMPRESSION:  Please see separate dictation for contemporaneously obtained CTA  chest, abdomen and pelvis for full description of relevant  extracardiac findings.  Electronically Signed:  By: Trudie Reed M.D.  On: 01/29/2017 09:53   CLINICAL DATA: 84 year old female history of severe aortic valve  stenosis. Preprocedural study prior to potential transcatheter  aortic valve replacement (TAVR).  EXAM:  CT ANGIOGRAPHY CHEST, ABDOMEN AND PELVIS  TECHNIQUE:  Multidetector CT imaging through the chest, abdomen and pelvis was  performed using the standard protocol during bolus administration of  intravenous contrast. Multiplanar reconstructed images and MIPs were  obtained and reviewed to evaluate the vascular anatomy.  CONTRAST: 95 mL of Isovue 370.  COMPARISON: CT the abdomen and pelvis 01/29/2013.  FINDINGS:  CTA CHEST FINDINGS  Cardiovascular: Heart size is mildly enlarged with left atrial  dilatation. There is no significant pericardial fluid, thickening or  pericardial calcification. Aortic atherosclerosis. Severe thickening  calcification of the aortic valve.  Mediastinum/Lymph Nodes: No pathologically enlarged mediastinal or  hilar lymph nodes. Esophagus is unremarkable in appearance. No  axillary lymphadenopathy.  Lungs/Pleura: Small calcified granuloma in the right upper lobe in  an area of focal architectural distortion which likely reflects some  mild post infectious or inflammatory scarring. No other suspicious  appearing pulmonary nodules or masses. No acute consolidative  airspace disease. No pleural effusions.  Musculoskeletal/Soft Tissues: Chronic compression fracture of T10  with post vertebroplasty changes and approximately 40% loss of  anterior  vertebral body height. There are no aggressive appearing  lytic or blastic lesions noted in the visualized portions of the  skeleton.  CTA ABDOMEN AND PELVIS FINDINGS  Hepatobiliary: No suspicious cystic or solid hepatic lesions. No  intra or extrahepatic biliary ductal dilatation. Gallbladder is  nearly completely decompressed, but otherwise unremarkable in  appearance.  Pancreas: No pancreatic mass. No pancreatic ductal dilatation. No  pancreatic or peripancreatic fluid or inflammatory changes.  Spleen: Unremarkable.  Adrenals/Urinary Tract: Bilateral kidneys and left adrenal gland are  normal in appearance. 11 x 8 mm right adrenal nodule, similar to  prior study, at which point this was low-attenuation, most  compatible with an adenoma. No hydroureteronephrosis. Urinary  bladder is normal in appearance.  Stomach/Bowel: Normal appearance of the stomach. No pathologic  dilatation of small bowel or colon. Numerous colonic diverticulae  are noted, particularly in the sigmoid colon, without surrounding  inflammatory changes to suggest an acute diverticulitis at this  time. The appendix is not confidently identified and may be  surgically absent. Regardless, there are no inflammatory changes  noted adjacent to the cecum to suggest the presence of an acute  appendicitis at this time.  Vascular/Lymphatic: Aortic atherosclerosis, without evidence of  aneurysm or dissection in the abdominal or pelvic vasculature.  Vascular findings and measurements pertinent to potential TAVR  procedure, as detailed below. Celiac axis, superior mesenteric  artery, inferior mesenteric artery and single renal arteries  bilaterally are all widely patent without hemodynamically  significant stenosis. No lymphadenopathy noted in the abdomen or  pelvis.  Reproductive: Prostate gland and seminal vesicles are unremarkable  in appearance.  Other: No significant volume of ascites. No pneumoperitoneum.    Musculoskeletal:  Chronic compression fractures of L1 and L2 with  approximately 30% loss of height at both levels and post  vertebroplasty changes at L2. Orthopedic fixation hardware in the  proximal femurs bilaterally. There are no aggressive appearing lytic  or blastic lesions noted in the visualized portions of the skeleton.  VASCULAR MEASUREMENTS PERTINENT TO TAVR:  AORTA:  Minimal Aortic Diameter-11 x 11 mm  Severity of Aortic Calcification-severe  RIGHT PELVIS:  Right Common Iliac Artery -  Minimal Diameter-8.8 x 8.0 mm  Tortuosity-mild  Calcification-moderate  Right External Iliac Artery -  Minimal Diameter-6.5 x 6.3 mm  Tortuosity-moderate  Calcification-mild  Right Common Femoral Artery -  Minimal Diameter-7.4 x 6.8 mm  Tortuosity-mild  Calcification-none  LEFT PELVIS:  Left Common Iliac Artery -  Minimal Diameter-8.9 x 8.1 mm  Tortuosity-mild  Calcification-moderate  Left External Iliac Artery -  Minimal Diameter-6.7 x 6.5 mm  Tortuosity-moderate  Calcification-none  Left Common Femoral Artery -  Minimal Diameter-7.0 x 7.1 mm  Tortuosity-mild  Calcification-none  Review of the MIP images confirms the above findings.  IMPRESSION:  1. Vascular findings and measurements pertinent to potential TAVR  procedure, as detailed above.  2. Severe thickening and calcification of the aortic valve,  compatible with the reported clinical history of severe aortic  stenosis.  3. Mild cardiomegaly with mild left atrial dilatation.  4. Additional incidental findings, as above.  Aortic Atherosclerosis (ICD10-I70.0).  Electronically Signed  By: Trudie Reed M.D.  On: 01/29/2017 11:49   Impression:   This 84 year old woman has stage D, severe, symptomatic aortic stenosis with NYHA class II-III symptoms of exertional shortness of breath and fatigue consistent with chronic diastolic heart failure. She has had episodes of dizziness in the past and multiple falls with  fractures. It is unclear if her dizziness was related to her aortic valve disease. Her history is also been complicated by recurrent GI bleeding due to AV malformations. She has not tolerated even low-dose aspirin without recurrent GI bleeding. I have personally reviewed her echocardiogram from August 2018, cardiac catheterization, and CTA studies. Her echocardiogram shows a trileaflet aortic valve with severely calcified leaflets and restricted mobility. Her mean gradient is 65 mmHg consistent with severe aortic stenosis. Cardiac catheterization shows no coronary disease. The mean gradient was measured at 42 mmHg consistent with severe aortic stenosis. I think aortic valve replacement is indicated in this patient. There is a possibility that her AVMs may be associated with her aortic stenosis and may improve with aortic valve replacement. Her operative risk for open surgical aortic valve replacement would be at least moderate due to her advanced age, frailty score of 4/12 as noted in her physical therapy assessment, limited mobility requiring a walker, and recurrent GI bleeding on low-dose aspirin. I think her operative risk would be significantly higher than that predicted by the STS risk score. I think transcatheter aortic valve replacement would be the best treatment for her. Her gated cardiac CT shows anatomy favorable for TAVR using a Sapien 3 valve. There is minimal calcification extending into the LVOT under the noncoronary leaflet but do not think this will significantly increase the risk of perivalvular leak. Her abdominal and pelvic CTA shows adequate pelvic vascular anatomy to allow transfemoral insertion. Her case has been discussed extensively by a multidisciplinary team of heart valve specialists and we felt that proceeding with transcatheter aortic valve replacement without the use of antiplatelet therapy postoperatively was a risk worth taking for this particular patient. She would be at increased  risk for valve thrombosis and this is been discussed extensively with the patient and her family and they understand this. If she has recurrent GI bleeding postoperatively then she would be referred to [facility] for consideration of push enteroscopy and ablation of her AVMs.  The patient and her son and daughter were counseled at length regarding treatment alternatives for management of severe symptomatic aortic stenosis. The risks and benefits of surgical intervention has been discussed in detail. Long-term prognosis with medical therapy was discussed. Alternative approaches such as conventional surgical aortic valve replacement, transcatheter aortic valve replacement, and palliative medical therapy were compared and contrasted at length. This discussion was placed in the context of the patient's own specific clinical presentation and past medical history. All of their questions been addressed.  She still needs to have a second surgical evaluation but assuming that we proceed with transcatheter aortic valve replacement, a discussion was held regarding what types of management strategies would be attempted intraoperatively in the event of life-threatening complications, including whether or not the patient would be considered a candidate for the use of cardiopulmonary bypass and/or conversion to open sternotomy for attempted surgical intervention.  The patient has been advised of a variety of complications that might develop including but not limited to risks of death, stroke, paravalvular leak, aortic dissection or other major vascular complications, aortic annulus rupture, device embolization, cardiac rupture or perforation, mitral regurgitation, acute myocardial infarction, arrhythmia, heart block or bradycardia requiring permanent pacemaker placement, congestive heart failure, respiratory failure, renal failure, pneumonia, infection, other late complications related to structural valve deterioration or migration,  or other complications that might ultimately cause a temporary or permanent loss of functional independence or other long term morbidity. The patient provides full informed consent for the procedure as described and all questions were answered.   Plan:   Transfemoral TAVR on 2/12/019.  Alleen Borne, MD

## 2017-02-12 NOTE — OR Nursing (Signed)
Arterial and Venous sheaths pulled. Pressure held for 20 minutes. Patient is hemodynamically stable. There is no hematoma. Bedrest started at 1359.

## 2017-02-12 NOTE — Progress Notes (Signed)
  Echocardiogram 2D Echocardiogram has been performed.  Janalyn HarderWest, Terrina Docter R 02/12/2017, 3:28 PM

## 2017-02-12 NOTE — Transfer of Care (Signed)
Immediate Anesthesia Transfer of Care Note  Patient: Kelsey [name]  Procedure(s) Performed: TRANSCATHETER AORTIC VALVE REPLACEMENT, TRANSFEMORAL using a 23mm Edwards Sapien 3 Aortic Valve (N/A Chest) TRANSESOPHAGEAL ECHOCARDIOGRAM (TEE) (N/A Chest)  Patient Location: ICU  Anesthesia Type:General  Level of Consciousness: awake and alert   Airway & Oxygen Therapy: Patient Spontanous Breathing and Patient connected to face mask oxygen  Post-op Assessment: Report given to RN and Post -op Vital signs reviewed and stable  Post vital signs: Reviewed and stable  Last Vitals:  Vitals:   02/12/17 0806 02/12/17 1146  BP: (!) 155/74   Pulse: 81 65  Resp: 19   Temp: 36.5 C   SpO2: 97%     Last Pain:  Vitals:   02/12/17 0806  TempSrc: Oral      Patients Stated Pain Goal: 3 (02/12/17 [id number])  Complications: No apparent anesthesia complications

## 2017-02-12 NOTE — CV Procedure (Signed)
[facility]  TAVR OPERATIVE NOTE   Date of Procedure:  02/12/2017  Preoperative Diagnosis: Severe Aortic Stenosis   Postoperative Diagnosis: Same   Procedure:    Transcatheter Aortic Valve Replacement - Transfemoral Approach  Edwards Sapien 3 THV (size 23 mm, model # [id number], serial # [id number])   Co-Surgeons:  Lauree Chandler, MD and Gaye Pollack, MD   Anesthesiologist:  Nyoka Cowden  Echocardiographer:  Johnsie Cancel  Pre-operative Echo Findings:  Severe aortic stenosis  Normal left ventricular systolic function  Post-operative Echo Findings:  No paravalvular leak  Normal left ventricular systolic function  New pericardial effusion-1 cm  BRIEF CLINICAL NOTE AND INDICATIONS FOR SURGERY  84 yo [name] with history of iron deficiency anemia, prior GI bleeding due to AVMs, asthma, severe aortic valve stenosis who is here today for TAVR. I saw her as a new consult 10/01/16 at the request of Dr. Fletcher Anon for discussion regarding her aortic valve stenosis and possible TAVR. She has prior GI bleeding due to AVMs but over the 6 months prior to her first visit with me, this had been stable. She has been followed for years for aortic stenosis. Echo 08/28/16 with normal LV systolic function, PPJK=93-26%. The aortic valve stenosis has progressed. Mean gradient 65 mmHg, peak gradient 106 mmHg, AVA 0.75 cm2. No mitral valve disease. She was seen in the ED at [facility] on 09/28/16 with c/o headache and dizziness. CTA head in th ED showed a small (88m) IC aneurysm, mild carotid disease and a 10 mm nodular lesion in the left vallecula 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  She has seen Neurosurgery and no further workup is indicated. She has seen ENT and there is no workup planned for the tongue abnormality. She described worsened dyspnea with dizziness at her first visit in my office. We discussed the TAVR procedure in detail. Cardiac cath 10/05/16 with no evidence of CAD. There  was severe AS with mean gradient 42 mmHg, peak to peak gradient 52 mmHg, AVA 0.69 cm2. I started ASA 81 mg daily post cath to see if she could tolerate this with history of AVMs and bleeding. [facility] one week of ASA therapy with weakness and melena, most likely due to AVMs. Hgb dropped from 11.9 to 6.1. ASA was stopped. EGD on 10/12/16 showed gastritis only. GI consult team felt that her bleeding was likely from her AVMs. She was given 3 units of pRBCs and Hgb was 10 prior to d/c home. Her melena resolved off of ASA. [facility] 7.0. She was transfused and had an upper endoscopy. She was treated for H Pylori and her bleeding was felt to be due to AVMs. [facility]  [facility] anemia and GI bleeding. Hgb was down to 6.9. She was transfused and discharged home. Capsule study in May 2018 with obvious AVMs. H/H has been stable over the past 6 weeks.   During the course of the patient's preoperative work up they have been evaluated comprehensively by a [facility].  They have been demonstrated to suffer from symptomatic severe aortic stenosis as noted above. The patient has been counseled extensively as to the relative risks and benefits of all options for the treatment of severe aortic stenosis including long term  medical therapy, conventional surgery for aortic valve replacement, and transcatheter aortic valve replacement.  The patient has been independently evaluated by two cardiac surgeons including Dr Roxy Manns and Dr. Cyndia Bent, and they are felt to be at high risk for conventional surgical aortic valve replacement. Both surgeons indicated the patient would be a poor candidate for conventional surgery. Based upon review of all of the patient's  preoperative diagnostic tests they are felt to be candidate for transcatheter aortic valve replacement using the transfemoral approach as an alternative to high risk conventional surgery.    Following the decision to proceed with transcatheter aortic valve replacement, a discussion has been held regarding what types of management strategies would be attempted intraoperatively in the event of life-threatening complications, including whether or not the patient would be considered a candidate for the use of cardiopulmonary bypass and/or conversion to open sternotomy for attempted surgical intervention.  The patient has been advised of a variety of complications that might develop peculiar to this approach including but not limited to risks of death, stroke, paravalvular leak, aortic dissection or other major vascular complications, aortic annulus rupture, device embolization, cardiac rupture or perforation, acute myocardial infarction, arrhythmia, heart block or bradycardia requiring permanent pacemaker placement, congestive heart failure, respiratory failure, renal failure, pneumonia, infection, other late complications related to structural valve deterioration or migration, or other complications that might ultimately cause a temporary or permanent loss of functional independence or other long term morbidity.  The patient provides full informed consent for the procedure as described and all questions were answered preoperatively.    DETAILS OF THE OPERATIVE PROCEDURE  PREPARATION:   The patient is brought to the operating room on the above mentioned date and central monitoring was established by the anesthesia team including placement of a radial arterial line. The patient is placed in the supine position on the operating table.  Intravenous antibiotics are administered. Conscious sedation is used.   Baseline transthoracic echocardiogram was performed. The patient's chest, abdomen, both groins, and both  lower extremities are prepared and draped in a sterile manner. A time out procedure is performed.   PERIPHERAL ACCESS:   Using the modified Seldinger technique, femoral arterial and venous access were obtained with placement of 6 Fr sheaths on the left side under u/s guidance.  A pigtail diagnostic catheter was passed through the femoral arterial sheath under fluoroscopic guidance into the aortic root.  A temporary transvenous pacemaker catheter was passed through the femoral venous sheath under fluoroscopic guidance into the right ventricle.  The pacemaker was tested to ensure stable lead placement and pacemaker capture. Aortic root angiography was performed in order to determine the optimal angiographic angle for valve deployment.  TRANSFEMORAL ACCESS:  A micropuncture kit was used to gain access to the right femoral artery under u/s guidance. Pre-closure with double ProGlide closure devices. The patient was heparinized systemically and ACT verified > 250 seconds.    A 14 Fr transfemoral E-sheath was introduced into the right femoral artery after progressively dilating over an Amplatz superstiff wire. An AL-2 catheter was used to direct a straight-tip exchange length wire across the native aortic valve into the left ventricle. This was exchanged out for a pigtail catheter and position was confirmed in the LV apex. Simultaneous LV and Ao pressures were recorded.  The pigtail catheter was then exchanged for an Amplatz Extra-stiff wire in the LV apex.   At this point, we elected to perform balloon valvuloplasty with simultaneous aortic root angiography in order to watch  the movement of the calcified aortic valve leaflet and insure that it did not cover the left main ostium. The leaflet approached the left main ostium. We then elected to protect the left main with a wire and undeployed stent. The left wrist was prepped and draped in a sterile fashion. I then placed a 6 French sheath in the left radial  artery. 3 mg Verapamil given through the sheath. I then removed the pigtail from the left femoral venous sheath and using a J wire advanced this pigtail catheter into the aortic root. I then passed a 6 [place] XB LAD 3.5 guiding catheter over the wire from the left femoral artery and engaged the left main artery. A cougar IC wire was advanced down the LAD. A 4.0 x 12 mm Xience DES was advanced over the wire and parked in the distal LAD, undeployed. The guiding catheter was then backed out of the ostium of the left main.   TRANSCATHETER HEART VALVE DEPLOYMENT:  An Edwards Sapien 3 THV (size 23 mm) was prepared and crimped per manufacturer's guidelines, and the proper orientation of the valve is confirmed on the [employer] delivery system. The valve was advanced through the introducer sheath using normal technique until in an appropriate position in the abdominal aorta beyond the sheath tip. The balloon was then retracted and using the fine-tuning wheel was centered on the valve. The valve was then advanced across the aortic arch using appropriate flexion of the catheter. The valve was carefully positioned across the aortic valve annulus. The Commander catheter was retracted using normal technique. Once final position of the valve has been confirmed by angiographic assessment, the valve is deployed while temporarily holding ventilation and during rapid ventricular pacing to maintain systolic blood pressure < 50 mmHg and pulse pressure < 10 mmHg. The balloon inflation is held for >3 seconds after reaching full deployment volume. Once the balloon has fully deflated the balloon is retracted into the ascending aorta and valve function is assessed using TTE. There is felt to be no paravalvular leak and no central aortic insufficiency.  The patient's hemodynamic recovery following valve deployment is good.  The deployment balloon and guidewire are both removed. Echo demostrated acceptable post-procedural gradients,  stable mitral valve function, and no AI.  There was a small pericardial effusion noted on the echo post valve deployment. The size of the effusion was unchanged 30 minutes after valve deployment.   PROCEDURE COMPLETION:  The sheath was then removed and closure devices were completed. Protamine was administered once femoral arterial repair was complete. The temporary pacemaker, pigtail catheters and femoral sheaths were removed with manual pressure used for hemostasis. Terumo hemostasis band applied to the left wrist.   The patient tolerated the procedure well and is transported to the intensive care unit in stable condition. There were no immediate intraoperative complications. All sponge instrument and needle counts are verified correct at completion of the operation.   No blood products were administered during the operation.  The patient received a total of 100.5 mL of intravenous contrast during the procedure.  Lauree Chandler MD 02/12/2017 1:27 PM

## 2017-02-12 NOTE — Progress Notes (Addendum)
Patient ID: [name] Gundrum, female   DOB: Jul 11, 1930, 84 y.o.   MRN: [id number] Cardiothoracic Surgery  She has been hemodynamically stable and is sinus brady 45-50's. ECG postop showed new LBBB. Continue to observe.  Repeat echo this afternoon shows no change in the small circumferential pericardial effusion. Will repeat in am.  Groin sites ok, TR band in place

## 2017-02-12 NOTE — Op Note (Signed)
[facility]   MULTIDISCIPLINARY HEART VALVE TEAM   TAVR OPERATIVE NOTE   Date of Procedure:  02/12/2017  Preoperative Diagnosis: Severe Aortic Stenosis   Postoperative Diagnosis: Same   Procedure:    Transcatheter Aortic Valve Replacement - Percutaneous Right Transfemoral Approach  Edwards Sapien 3 THV (size 23 mm, model # 9600TFX, serial # [id number])   Co-Surgeons:  Alleen BorneBryan K. Vincent Ehrler, MD and Verne Carrowhristopher McAlhany, MD   Anesthesiologist:  Dorris Singhharlene Green MD  Echocardiographer:  Charlton HawsPeter Nishan, MD  Pre-operative Echo Findings:  Severe aortic stenosis  Normal left ventricular systolic function  Post-operative Echo Findings:  NO paravalvular leak  Normal left ventricular systolic function  New pericardial effusion 1cm unchanged after continued observation in OR   BRIEF CLINICAL NOTE AND INDICATIONS FOR SURGERY  This 84 year old woman has stage D, severe, symptomatic aortic stenosis with NYHA class II-III symptoms of exertional shortness of breath and fatigue consistent with chronic diastolic heart failure. She has had episodes of dizziness in the past and multiple falls with fractures. It is unclear if her dizziness was related to her aortic valve disease. Her history is also been complicated by recurrent GI bleeding due to AV malformations. She has not tolerated even low-dose aspirin without recurrent GI bleeding. I have personally reviewed her echocardiogram from August 2018, cardiac catheterization, and CTA studies. Her echocardiogram shows a trileaflet aortic valve with severely calcified leaflets and restricted mobility. Her mean gradient is 65 mmHg consistent with severe aortic stenosis. Cardiac catheterization shows no coronary disease. The mean gradient was measured at 42 mmHg consistent with severe aortic stenosis. I think aortic valve replacement is indicated in this patient. There is a possibility that her AVMs may be associated with her aortic stenosis and  may improve with aortic valve replacement. Her operative risk for open surgical aortic valve replacement would be at least moderate due to her advanced age, frailty score of 4/12 as noted in her physical therapy assessment, limited mobility requiring a walker, and recurrent GI bleeding on low-dose aspirin. I think her operative risk would be significantly higher than that predicted by the STS risk score. I think transcatheter aortic valve replacement would be the best treatment for her. Her gated cardiac CT shows anatomy favorable for TAVR using a Sapien 3 valve. There is minimal calcification extending into the LVOT under the noncoronary leaflet but do not think this will significantly increase the risk of perivalvular leak. Her abdominal and pelvic CTA shows adequate pelvic vascular anatomy to allow transfemoral insertion. Her case has been discussed extensively by a multidisciplinary team of heart valve specialists and we felt that proceeding with transcatheter aortic valve replacement without the use of antiplatelet therapy postoperatively was a risk worth taking for this particular patient. She would be at increased risk for valve thrombosis and this is been discussed extensively with the patient and her family and they understand this. If she has recurrent GI bleeding postoperatively then she would be referred to [facility] for consideration of push enteroscopy and ablation of her AVMs.  The patient and her son and daughter were counseled at length regarding treatment alternatives for management of severe symptomatic aortic stenosis. The risks and benefits of surgical intervention has been discussed in detail. Long-term prognosis with medical therapy was discussed. Alternative approaches such as conventional surgical aortic valve replacement, transcatheter aortic valve replacement, and palliative medical therapy were compared and contrasted at length. This discussion was placed in the context of the patient's own  specific clinical presentation and  past medical history. All of their questions been addressed.  She still needs to have a second surgical evaluation but assuming that we proceed with transcatheter aortic valve replacement, a discussion was held regarding what types of management strategies would be attempted intraoperatively in the event of life-threatening complications, including whether or not the patient would be considered a candidate for the use of cardiopulmonary bypass and/or conversion to open sternotomy for attempted surgical intervention.  The patient has been advised of a variety of complications that might develop including but not limited to risks of death, stroke, paravalvular leak, aortic dissection or other major vascular complications, aortic annulus rupture, device embolization, cardiac rupture or perforation, mitral regurgitation, acute myocardial infarction, arrhythmia, heart block or bradycardia requiring permanent pacemaker placement, congestive heart failure, respiratory failure, renal failure, pneumonia, infection, other late complications related to structural valve deterioration or migration, or other complications that might ultimately cause a temporary or permanent loss of functional independence or other long term morbidity. The patient provides full informed consent for the procedure as described and all questions were answered.      DETAILS OF THE OPERATIVE PROCEDURE  PREPARATION:    The patient is brought to the operating room on the above mentioned date and central monitoring was established by the anesthesia team including placement of a central venous line and radial arterial line. The patient is placed in the supine position on the operating table.  Intravenous antibiotics are administered. The patient is monitored closely throughout the procedure under conscious sedation. Baseline transthoracic echocardiogram was performed. The patient's chest, abdomen, both  groins, and both lower extremities are prepared and draped in a sterile manner. A time out procedure is performed.   PERIPHERAL ACCESS:    Using the modified Seldinger technique, femoral arterial and venous access was obtained with placement of 6 Fr sheaths on the left side.  A pigtail diagnostic catheter was passed through the left arterial sheath under fluoroscopic guidance into the aortic root.  A temporary transvenous pacemaker catheter was passed through the left femoral venous sheath under fluoroscopic guidance into the right ventricle.  The pacemaker was tested to ensure stable lead placement and pacemaker capture. Aortic root angiography was performed in order to determine the optimal angiographic angle for valve deployment.   TRANSFEMORAL ACCESS:   Percutaneous transfemoral access and sheath placement was performed by Dr. Clifton James using ultrasound guidance.  The right common femoral artery was cannulated using a micropuncture needle and appropriate location was verified using hand injection angiogram.  A pair of Abbott Perclose percutaneous closure devices were placed and a 6 French sheath replaced into the femoral artery.  The patient was heparinized systemically and ACT verified > 250 seconds.    A 14 Fr transfemoral E-sheath was introduced into the right femoral artery after progressively dilating over an Amplatz superstiff wire. An AL-2 catheter was used to direct a straight-tip exchange length wire across the native aortic valve into the left ventricle. This was exchanged out for a pigtail catheter and position was confirmed in the LV apex. Simultaneous LV and Ao pressures were recorded.  The pigtail catheter was exchanged for an Amplatz Extra-stiff wire in the LV apex.    BALLOON AORTIC VALVULOPLASTY:   We decided to perform balloon valvuloplasty since there was a chunk of calcium in the left coronary leaflet that was anticipated to be close to the left main if not obstructing it with  valve deployment. Balloon aortic valvuloplasty was performed using a 22 mm True balloon  with injection of contrast via the pigtail in the root with the balloon inflated.  Once optimal position was achieved, BAV was done under rapid ventricular pacing.  There was no flow seen in the left main with the balloon inflated and it appeared that the chunk of calcium in the left leaflet may be obstructing the left main. The patient recovered well hemodynamically. Therefore we decided to protect the left main by parking a stent in the LAD in case of left main occlusion. This is dictated in Dr. Gibson Ramp note.  TRANSCATHETER HEART VALVE DEPLOYMENT:   An Edwards Sapien 3 transcatheter heart valve (size 23 mm, model #9600TFX, serial #[id number]) was prepared and crimped per manufacturer's guidelines, and the proper orientation of the valve is confirmed on the [employer] delivery system. The valve was advanced through the introducer sheath using normal technique until in an appropriate position in the abdominal aorta beyond the sheath tip. The balloon was then retracted and using the fine-tuning wheel was centered on the valve. The valve was then advanced across the aortic arch using appropriate flexion of the catheter. The valve was carefully positioned across the aortic valve annulus. The Commander catheter was retracted using normal technique. Once final position of the valve has been confirmed by angiographic assessment, the valve is deployed while temporarily holding ventilation and during rapid ventricular pacing to maintain systolic blood pressure < 50 mmHg and pulse pressure < 10 mmHg. The balloon inflation is held for >3 seconds after reaching full deployment volume. Once the balloon has fully deflated the balloon is retracted into the ascending aorta and valve function is assessed using echocardiography. There is felt to be no paravalvular leak and no central aortic insufficiency. There was a new 1 cm  pericardial effusion.  The patient's hemodynamic recovery following valve deployment is good.  The deployment balloon and guidewire are both removed. An aortic root injection was performed and there was no sign of aortic dissection, rupture, or contrast staining. There was brisk flow down both left and right coronary arteries.   PROCEDURE COMPLETION:   The sheath was removed and femoral artery closure performed by Dr Clifton James.  Protamine was administered once femoral arterial repair was complete. The temporary pacemaker, pigtail catheters and femoral/radial sheaths were removed with manual pressure used for hemostasis.   A repeat echo 30 minutes after the initial echo showed no change in the 1 cm pericardial effusion with continued hemodynamic stability.  The patient tolerated the procedure well and is transported to the surgical intensive care in stable condition. There were no immediate intraoperative complications. All sponge instrument and needle counts are verified correct at completion of the operation.   No blood products were administered during the operation.  The patient received a total of 100.5 mL of intravenous contrast during the procedure.   Alleen Borne, MD 02/12/2017 1:43 PM

## 2017-02-12 NOTE — Anesthesia Postprocedure Evaluation (Signed)
Anesthesia Post Note  Patient: Kelsey [name]  Procedure(s) Performed: TRANSCATHETER AORTIC VALVE REPLACEMENT, TRANSFEMORAL using a 70m Edwards Sapien 3 Aortic Valve (N/A Chest) TRANSESOPHAGEAL ECHOCARDIOGRAM (TEE) (N/A Chest)     Patient location during evaluation: ICU Anesthesia Type: MAC Level of consciousness: awake Pain management: pain level controlled Vital Signs Assessment: post-procedure vital signs reviewed and stable Respiratory status: spontaneous breathing Cardiovascular status: stable Anesthetic complications: no    Last Vitals:  Vitals:   02/12/17 1415 02/12/17 1430  BP: 133/60 (!) 130/56  Pulse:    Resp: 15 13  Temp:    SpO2: 100% 100%    Last Pain:  Vitals:   02/12/17 0806  TempSrc: Oral                 Marlyn Rabine

## 2017-02-12 NOTE — H&P (Signed)
  [facility]   MULTIDISCIPLINARY HEART VALVE TEAM   History and Physical Interval Note:  02/12/2017, 9:39 AM   Kelsey [name] has presented today for surgery, with the diagnosis of severe aortic stenosis  The various methods of treatment have previously been discussed with the patient and family. After consideration of risks, benefits and other options for treatment, the patient has consented to  Procedure(s): Procedure(s): TRANSCATHETER AORTIC VALVE REPLACEMENT, TRANSFEMORAL (N/A) TRANSESOPHAGEAL ECHOCARDIOGRAM (TEE) (N/A) as a surgical intervention.   The patient reports the following:   Shortness of breath: Yes.   If yes: with what activity?: walking in her house Worse than previously noted?: No.  New edema, PND, orthopnea: No.  Recent decrease in activity or worsening fatigue i.e. more difficulty walking to mailbox, climbing stairs, etc: No.  Changes since last seen in pre-op visit: Yes.  Cough  BNP is 351.7  The patient's history has been reviewed, patient briefly examined, no change in status (or as listed above), stable for surgery.  I have reviewed the patient's chart and labs. Questions were answered to the patient's satisfaction.     Alleen BorneBryan K Bartle

## 2017-02-12 NOTE — Anesthesia Preprocedure Evaluation (Signed)
Anesthesia Evaluation  Patient identified by MRN, date of birth, ID band Patient awake    Reviewed: Allergy & Precautions, NPO status   Airway Mallampati: II  TM Distance: >3 FB     Dental   Pulmonary asthma ,    breath sounds clear to auscultation       Cardiovascular + Valvular Problems/Murmurs  Rhythm:Regular Rate:Normal + Systolic murmurs    Neuro/Psych    GI/Hepatic negative GI ROS, Neg liver ROS,   Endo/Other  negative endocrine ROS  Renal/GU negative Renal ROS     Musculoskeletal  (+) Arthritis ,   Abdominal   Peds  Hematology  (+) anemia ,   Anesthesia Other Findings   Reproductive/Obstetrics                             Anesthesia Physical Anesthesia Plan  ASA: III  Anesthesia Plan: MAC   Post-op Pain Management:    Induction: Intravenous  PONV Risk Score and Plan: 2 and Treatment may vary due to age or medical condition  Airway Management Planned: Nasal Cannula and Simple Face Mask  Additional Equipment:   Intra-op Plan:   Post-operative Plan:   Informed Consent: I have reviewed the patients History and Physical, chart, labs and discussed the procedure including the risks, benefits and alternatives for the proposed anesthesia with the patient or authorized representative who has indicated his/her understanding and acceptance.   Dental advisory given  Plan Discussed with: CRNA and Anesthesiologist  Anesthesia Plan Comments:         Anesthesia Quick Evaluation

## 2017-02-12 NOTE — Progress Notes (Signed)
Patient doing well, c/o mild headache, states our beds are not comfortable. HR in the 50's, MD aware and states this is where she normally is at. All drips have been stopped and blood pressure is stable. TR band off at 1800 and site is a level 0. Both femoral sites are level 0 at this time. Bedrest started at 1400 and should end at 1800. Patient alert and sitting up at a 45 degree angle at this time. Tolerating food and drink with no nausea.

## 2017-02-13 ENCOUNTER — Other Ambulatory Visit: Payer: Self-pay

## 2017-02-13 ENCOUNTER — Inpatient Hospital Stay (HOSPITAL_COMMUNITY): Payer: Medicare Other

## 2017-02-13 ENCOUNTER — Encounter (HOSPITAL_COMMUNITY): Payer: Self-pay | Admitting: Cardiovascular Disease

## 2017-02-13 DIAGNOSIS — I350 Nonrheumatic aortic (valve) stenosis: Secondary | ICD-10-CM

## 2017-02-13 DIAGNOSIS — Z952 Presence of prosthetic heart valve: Secondary | ICD-10-CM

## 2017-02-13 DIAGNOSIS — I361 Nonrheumatic tricuspid (valve) insufficiency: Secondary | ICD-10-CM

## 2017-02-13 DIAGNOSIS — I309 Acute pericarditis, unspecified: Secondary | ICD-10-CM

## 2017-02-13 LAB — ECHOCARDIOGRAM COMPLETE
Ao-asc: 28 cm
E decel time: 289 msec
E/e' ratio: 18.22
FS: 28 % (ref 28–44)
HEIGHTINCHES: 61 in
IVS/LV PW RATIO, ED: 0.91
LA ID, A-P, ES: 28 mm
LA diam end sys: 28 mm
LA diam index: 1.9 cm/m2
LA vol index: 42.1 mL/m2
LAVOL: 62.1 mL
LAVOLA4C: 56.8 mL
LV E/e'average: 18.22
LV PW d: 14.5 mm — AB (ref 0.6–1.1)
LVEEMED: 18.22
LVELAT: 5.22 cm/s
LVOT area: 2.01 cm2
LVOT diameter: 16 mm
MV Dec: 289
MV Peak grad: 4 mmHg
MV pk A vel: 143 m/s
MVPKEVEL: 95.1 m/s
RV TAPSE: 19.1 mm
RV sys press: 51 mmHg
Reg peak vel: 300 cm/s
TDI e' lateral: 5.22
TDI e' medial: 4.79
TRMAXVEL: 300 cm/s
WEIGHTICAEL: 1788.37 [oz_av]

## 2017-02-13 LAB — BASIC METABOLIC PANEL
Anion gap: 11 (ref 5–15)
BUN: 21 mg/dL — ABNORMAL HIGH (ref 6–20)
CHLORIDE: 107 mmol/L (ref 101–111)
CO2: 21 mmol/L — AB (ref 22–32)
CREATININE: 0.84 mg/dL (ref 0.44–1.00)
Calcium: 8.6 mg/dL — ABNORMAL LOW (ref 8.9–10.3)
GFR calc non Af Amer: >60 mL/min (ref >60)
GLUCOSE: 83 mg/dL (ref 65–99)
Potassium: 3.7 mmol/L (ref 3.5–5.1)
Sodium: 139 mmol/L (ref 135–145)

## 2017-02-13 LAB — CBC
HEMATOCRIT: 29.2 % — AB (ref 36.0–46.0)
HEMOGLOBIN: 9 g/dL — AB (ref 12.0–15.0)
MCH: 30.3 pg (ref 26.0–34.0)
MCHC: 30.8 g/dL (ref 30.0–36.0)
MCV: 98.3 fL (ref 78.0–100.0)
Platelets: 129 10*3/uL — ABNORMAL LOW (ref 150–400)
RBC: 2.97 MIL/uL — ABNORMAL LOW (ref 3.87–5.11)
RDW: 15.9 % — ABNORMAL HIGH (ref 11.5–15.5)
WBC: 7 10*3/uL (ref 4.0–10.5)

## 2017-02-13 LAB — MAGNESIUM: Magnesium: 1.8 mg/dL (ref 1.7–2.4)

## 2017-02-13 MED ORDER — VANCOMYCIN HCL IN DEXTROSE 1-5 GM/200ML-% IV SOLN
1000.0000 mg | Freq: Once | INTRAVENOUS | Status: AC
  Administered 2017-02-13: 1000 mg via INTRAVENOUS
  Filled 2017-02-13: qty 200

## 2017-02-13 MED ORDER — SODIUM CHLORIDE 0.9 % IV SOLN
0.0000 ug/min | INTRAVENOUS | Status: DC
  Administered 2017-02-13: 5 ug/min via INTRAVENOUS
  Filled 2017-02-13: qty 1

## 2017-02-13 MED ORDER — IBUPROFEN 200 MG PO TABS
600.0000 mg | ORAL_TABLET | Freq: Once | ORAL | Status: AC
  Administered 2017-02-13: 600 mg via ORAL
  Filled 2017-02-13: qty 3

## 2017-02-13 MED ORDER — COLCHICINE 0.6 MG PO TABS
0.6000 mg | ORAL_TABLET | Freq: Every day | ORAL | Status: DC
  Administered 2017-02-13 – 2017-02-15 (×3): 0.6 mg via ORAL
  Filled 2017-02-13 (×3): qty 1

## 2017-02-13 MED ORDER — ALBUMIN HUMAN 5 % IV SOLN
12.5000 g | Freq: Once | INTRAVENOUS | Status: AC
  Administered 2017-02-13: 12.5 g via INTRAVENOUS
  Filled 2017-02-13: qty 250

## 2017-02-13 MED FILL — Magnesium Sulfate Inj 50%: INTRAMUSCULAR | Qty: 10 | Status: AC

## 2017-02-13 MED FILL — Heparin Sodium (Porcine) Inj 1000 Unit/ML: INTRAMUSCULAR | Qty: 30 | Status: AC

## 2017-02-13 MED FILL — Potassium Chloride Inj 2 mEq/ML: INTRAVENOUS | Qty: 40 | Status: AC

## 2017-02-13 NOTE — Progress Notes (Addendum)
  [facility]   MULTIDISCIPLINARY HEART VALVE TEAM   Echo reviewed. She has a stable pericardial effusion with no evidence of tamponade physiology. She is having 9/10 pleuritic chest pain, likely related to pericardial irritation 2/2 effusion.  I will try one dose of ibuprofen 600mg  x1 to see if this helps. I will also start 0.6mg  colchicine daily.   Cline CrockKathryn Kirsty Monjaraz PA-C  [facility]

## 2017-02-13 NOTE — Progress Notes (Signed)
Dr. Donata ClayVan Trigt made aware of persistent SBPs in the 80s-90s with MAPs in the low 50s. Patient is asymptomatic at this time. Will give 1 albumin, and initiate a Neo drip to maintain a SBP > 90. Will continue to closely monitor. Thresa RossA. Haggard RN

## 2017-02-13 NOTE — Progress Notes (Signed)
  Echocardiogram 2D Echocardiogram has been performed.  Kelsey Le 02/13/2017, 12:18 PM

## 2017-02-13 NOTE — Progress Notes (Signed)
[facility]   MULTIDISCIPLINARY HEART VALVE TEAM  Patient Name: Kelsey Le Date of Encounter: 02/13/2017  Primary Cardiologist: Dr. Kirke Corin / Dr. Clifton James & Dr. Laneta Simmers (TAVR)  Hospital Problem List     Principal Problem:   S/P TAVR (transcatheter aortic valve replacement) Active Problems:   Severe aortic stenosis   Iron deficiency anemia   Acute on chronic diastolic heart failure (HCC)   Arteriovenous malformation (AVM)     Subjective   Wants central line out. Has some sharp chest pain with inhalation. No other complaints.   Inpatient Medications    Scheduled Meds: . acetaminophen  1,000 mg Oral Q6H   Or  . acetaminophen (TYLENOL) oral liquid 160 mg/5 mL  1,000 mg Per Tube Q6H  . pantoprazole  40 mg Oral Daily   Continuous Infusions: . sodium chloride Stopped (02/12/17 2145)  . albumin human    . lactated ringers    . nitroGLYCERIN    . phenylephrine (NEO-SYNEPHRINE) Adult infusion Stopped (02/12/17 1640)  . vancomycin     PRN Meds: albumin human, lactated ringers, morphine injection, ondansetron (ZOFRAN) IV, oxyCODONE, traMADol   Vital Signs    Vitals:   02/13/17 0530 02/13/17 0600 02/13/17 0700 02/13/17 0823  BP: (!) 120/45 (!) 135/51 (!) 140/54   Pulse:      Resp: 17 13 13    Temp:    99 F (37.2 C)  TempSrc:    Oral  SpO2: 93% 93% 98%   Weight:      Height:        Intake/Output Summary (Last 24 hours) at 02/13/2017 0833 Last data filed at 02/13/2017 0700 Gross per 24 hour  Intake 1897.5 ml  Output 150 ml  Net 1747.5 ml   Filed Weights   02/12/17 0821 02/13/17 0500  Weight: 110 lb (49.9 kg) 111 lb 12.4 oz (50.7 kg)    Physical Exam   GEN: Well nourished, well developed, in no acute distress. Thin, white female sitting up in chair HEENT: Grossly normal.  Neck: Supple, no JVD, carotid bruits, or masses. Cardiac: RRR, no murmurs, rubs, or gallops. No clubbing, cyanosis, edema.  Radials/DP/PT 2+ and equal bilaterally.    Respiratory:  Respirations regular and unlabored, clear to auscultation bilaterally. GI: Soft, nontender, nondistended, BS + x 4. MS: no deformity or atrophy. Skin: warm and dry, no rash. Groin sites stable  Neuro:  Strength and sensation are intact. Psych: AAOx3.  Normal affect.  Labs    CBC Recent Labs    02/12/17 1335 02/12/17 1344 02/13/17 0625  WBC 7.2  --  7.0  HGB 8.4* 8.2* 9.0*  HCT 26.5* 24.0* 29.2*  MCV 96.7  --  98.3  PLT 132*  --  129*   Basic Metabolic Panel Recent Labs    11/91/47 1309 02/12/17 1344 02/13/17 0625  NA 141 142 139  K 3.5 3.7 3.7  CL 104  --  107  CO2  --   --  21*  GLUCOSE 101* 109* 83  BUN 17  --  21*  CREATININE 0.50  --  0.84  CALCIUM  --   --  8.6*  MG  --   --  1.8   Liver Function Tests No results for input(s): AST, ALT, ALKPHOS, BILITOT, PROT, ALBUMIN in the last 72 hours. No results for input(s): LIPASE, AMYLASE in the last 72 hours. Cardiac Enzymes No results for input(s): CKTOTAL, CKMB, CKMBINDEX, TROPONINI in the last 72 hours. BNP Invalid input(s): POCBNP D-Dimer  No results for input(s): DDIMER in the last 72 hours. Hemoglobin A1C No results for input(s): HGBA1C in the last 72 hours. Fasting Lipid Panel No results for input(s): CHOL, HDL, LDLCALC, TRIG, CHOLHDL, LDLDIRECT in the last 72 hours. Thyroid Function Tests No results for input(s): TSH, T4TOTAL, T3FREE, THYROIDAB in the last 72 hours.  Invalid input(s): FREET3  Telemetry    sinus - Personally Reviewed  ECG    Sinus with new LBBB, HR 68 - Personally Reviewed  Radiology    Dg Chest Port 1 View  Result Date: 02/12/2017 CLINICAL DATA:  Status post aortic valve replacement. Aortic stenosis. EXAM: PORTABLE CHEST 1 VIEW COMPARISON:  February 07, 2007 FINDINGS: There is an aortic valve replacement. Central catheter tip is in the superior vena cava. No pneumothorax. There is slight scarring in the left base. There is no edema or consolidation. Heart size and  pulmonary vascularity are normal. No adenopathy. Patient has undergone previous kyphoplasty procedures at T10 and L2, stable. IMPRESSION: Status post aortic valve replacement. Central catheter tip in superior vena cava. No pneumothorax. Scarring left base. No edema or consolidation. Stable cardiac silhouette. There is aortic atherosclerosis. Aortic Atherosclerosis (ICD10-I70.0). Electronically Signed   By: Bretta Bang III M.D.   On: 02/12/2017 14:15    Cardiac Studies   TAVR OPERATIVE NOTE   Date of Procedure:                02/12/2017  Preoperative Diagnosis:      Severe Aortic Stenosis   Postoperative Diagnosis:    Same   Procedure:        Transcatheter Aortic Valve Replacement - Percutaneous Right Transfemoral Approach             Edwards Sapien 3 THV (size 23 mm, model # 9600TFX, serial # [id number])              Co-Surgeons:                        Alleen Borne, MD and Verne Carrow, MD  Pre-operative Echo Findings: ? Severe aortic stenosis ? Normal left ventricular systolic function  Post-operative Echo Findings: ? NO paravalvular leak ? Normal left ventricular systolic function ? New pericardial effusion 1cm unchanged after continued observation in OR  _____________________   Post op limited echo 02/12/17 Study Conclusions - Left ventricle: The cavity size was normal. There was moderate   concentric hypertrophy. Systolic function was vigorous. The   estimated ejection fraction was in the range of 65% to 70%. Wall   motion was normal; there were no regional wall motion   abnormalities. Doppler parameters are consistent with abnormal   left ventricular relaxation (grade 1 diastolic dysfunction). - Aortic valve: A Sapien 23mm TAVR bioprosthesis was present. The   prosthesis had a normal function. - Mitral valve: Calcified annulus. - Pericardium, extracardiac: A small pericardial effusion was   identified circumferential to the heart. There was no  evidence of   hemodynamic compromise. Impressions: - Effusion is similar when compared to prior.  _____________________  POD #1 echo 02/13/17: pending   Patient Profile     Kelsey Le is a 84 y.o. female with a history of recurrent GI bleeds from AVMS, IDA, osteopersosis with multiple rib fractures from falls, chronic diastolic CHF and severe AS who presented to [facility] on 02/12/17 for planned TAVR.  Assessment & Plan    Severe AS: s/p successful TAVR with a 23 mm Edwards Sapien  THV via TF approach on 02/12/17. Post valve deployment, she developed a small pericardial effusion, which has remained stable. POD1 echo pending today. ECG with new LBBB but no high grade block. No ASA or plavix given history of GI bleed and pericardial effusion to prevent possible hemorraghic conversion. Will keep her in ICU until we get results of echo today. Okay to remove central and arterial lines as they are quite bothersome to her.   Pericardial effusion: she is having some pleuritic chest pain, which may be related to pericardial effusion. I provided reassurance for now. If this continues to worsen we cuold think about starting colchicine. Will follow echo today.   History of recurrent GI bleeds: Hg has remained stable ~ [address]9    Signed, Cline CrockKathryn Thompson, PA-C  02/13/2017, 8:33 AM  Pager [phone]  I have personally seen and examined this patient. I agree with the assessment and plan as outlined above.  She is stable this am. Will repeat echo to assess size of pericardial effusion. Mild chest pain likely due to pericardial irritation from effusion. Remove lines. No ASA or Plavix given GI bleeding. H/H stable this am.   Verne CarrowChristopher McAlhany 02/13/2017 10:57 AM

## 2017-02-13 NOTE — Plan of Care (Signed)
  Progressing Education: Knowledge of General Education information will improve 02/13/2017 1041 - Progressing by Karren BurlyEckelmann, Kealii Thueson E, RN Clinical Measurements: Respiratory complications will improve 02/13/2017 1041 - Progressing by Karren BurlyEckelmann, Beatrice Ziehm E, RN Nutrition: Adequate nutrition will be maintained 02/13/2017 1041 - Progressing by Karren BurlyEckelmann, Deitra Craine E, RN Note Patient with an excellent appetite. Elimination: Will not experience complications related to urinary retention 02/13/2017 1041 - Progressing by Karren BurlyEckelmann, Marina Desire E, RN

## 2017-02-14 ENCOUNTER — Encounter: Payer: Self-pay | Admitting: Thoracic Surgery (Cardiothoracic Vascular Surgery)

## 2017-02-14 LAB — CBC
HEMATOCRIT: 30 % — AB (ref 36.0–46.0)
HEMOGLOBIN: 9.4 g/dL — AB (ref 12.0–15.0)
MCH: 30.8 pg (ref 26.0–34.0)
MCHC: 31.3 g/dL (ref 30.0–36.0)
MCV: 98.4 fL (ref 78.0–100.0)
Platelets: 125 10*3/uL — ABNORMAL LOW (ref 150–400)
RBC: 3.05 MIL/uL — AB (ref 3.87–5.11)
RDW: 15.9 % — ABNORMAL HIGH (ref 11.5–15.5)
WBC: 9.5 10*3/uL (ref 4.0–10.5)

## 2017-02-14 LAB — BASIC METABOLIC PANEL
ANION GAP: 11 (ref 5–15)
BUN: 27 mg/dL — ABNORMAL HIGH (ref 6–20)
CHLORIDE: 105 mmol/L (ref 101–111)
CO2: 21 mmol/L — AB (ref 22–32)
Calcium: 8.7 mg/dL — ABNORMAL LOW (ref 8.9–10.3)
Creatinine, Ser: 1.23 mg/dL — ABNORMAL HIGH (ref 0.44–1.00)
GFR calc non Af Amer: 39 mL/min — ABNORMAL LOW (ref >60)
GFR, EST AFRICAN AMERICAN: 45 mL/min — AB (ref >60)
Glucose, Bld: 99 mg/dL (ref 65–99)
POTASSIUM: 3.7 mmol/L (ref 3.5–5.1)
SODIUM: 137 mmol/L (ref 135–145)

## 2017-02-14 MED ORDER — IBUPROFEN 200 MG PO TABS
600.0000 mg | ORAL_TABLET | Freq: Three times a day (TID) | ORAL | Status: DC
  Administered 2017-02-14: 600 mg via ORAL
  Filled 2017-02-14: qty 3

## 2017-02-14 NOTE — Progress Notes (Signed)
[facility]   MULTIDISCIPLINARY HEART VALVE TEAM  Patient Name: Kelsey Le Date of Encounter: 02/14/2017  Primary Cardiologist: Dr. Kirke Corin / Dr. Clifton James & Dr. Laneta Simmers (TAVR)  Hospital Problem List     Principal Problem:   S/P TAVR (transcatheter aortic valve replacement) Active Problems:   Severe aortic stenosis   Iron deficiency anemia   Acute on chronic diastolic heart failure (HCC)   Arteriovenous malformation (AVM)     Subjective   Many complaints. Still having sharp chest pain. Also cannot urinate and hasnt had a BM  Inpatient Medications    Scheduled Meds: . acetaminophen  1,000 mg Oral Q6H   Or  . acetaminophen (TYLENOL) oral liquid 160 mg/5 mL  1,000 mg Per Tube Q6H  . colchicine  0.6 mg Oral Daily  . ibuprofen  600 mg Oral TID  . pantoprazole  40 mg Oral Daily   Continuous Infusions: . sodium chloride Stopped (02/12/17 2145)  . lactated ringers    . nitroGLYCERIN    . phenylephrine (NEO-SYNEPHRINE) Adult infusion Stopped (02/14/17 0600)   PRN Meds: lactated ringers, morphine injection, ondansetron (ZOFRAN) IV, oxyCODONE, traMADol   Vital Signs    Vitals:   02/14/17 0645 02/14/17 0700 02/14/17 0715 02/14/17 0810  BP: (!) 146/53 (!) 146/56 (!) 142/60   Pulse:      Resp: 17 15 15    Temp:    98.4 F (36.9 C)  TempSrc:    Oral  SpO2: 97% 97% 97%   Weight:      Height:        Intake/Output Summary (Last 24 hours) at 02/14/2017 0859 Last data filed at 02/14/2017 0600 Gross per 24 hour  Intake 547.01 ml  Output 600 ml  Net -52.99 ml   Filed Weights   02/12/17 0821 02/13/17 0500  Weight: 110 lb (49.9 kg) 111 lb 12.4 oz (50.7 kg)    Physical Exam   GEN: Well nourished, well developed, in no acute distress. Thin, frail,  white female sitting up in bed HEENT: Grossly normal.  Neck: Supple, no JVD, carotid bruits, or masses. Cardiac: RRR, no murmurs, rubs, or gallops. No clubbing, cyanosis, edema.  Radials/DP/PT 2+ and equal  bilaterally.  Respiratory:  Respirations regular and unlabored, clear to auscultation bilaterally. GI: Soft, nontender, nondistended, BS + x 4. MS: no deformity or atrophy. Skin: warm and dry, no rash. Groin sites stable  Neuro:  Strength and sensation are intact. Psych: AAOx3.  Normal affect.  Labs    CBC Recent Labs    02/13/17 0625 02/14/17 0415  WBC 7.0 9.5  HGB 9.0* 9.4*  HCT 29.2* 30.0*  MCV 98.3 98.4  PLT 129* 125*   Basic Metabolic Panel Recent Labs    16/10/96 0625 02/14/17 0415  NA 139 137  K 3.7 3.7  CL 107 105  CO2 21* 21*  GLUCOSE 83 99  BUN 21* 27*  CREATININE 0.84 1.23*  CALCIUM 8.6* 8.7*  MG 1.8  --    Liver Function Tests No results for input(s): AST, ALT, ALKPHOS, BILITOT, PROT, ALBUMIN in the last 72 hours. No results for input(s): LIPASE, AMYLASE in the last 72 hours. Cardiac Enzymes No results for input(s): CKTOTAL, CKMB, CKMBINDEX, TROPONINI in the last 72 hours. BNP Invalid input(s): POCBNP D-Dimer No results for input(s): DDIMER in the last 72 hours. Hemoglobin A1C No results for input(s): HGBA1C in the last 72 hours. Fasting Lipid Panel No results for input(s): CHOL, HDL, LDLCALC, TRIG, CHOLHDL, LDLDIRECT in  the last 72 hours. Thyroid Function Tests No results for input(s): TSH, T4TOTAL, T3FREE, THYROIDAB in the last 72 hours.  Invalid input(s): FREET3  Telemetry    sinus - Personally Reviewed  ECG    Sinus with new LBBB, HR 68 - Personally Reviewed  Radiology    Dg Chest Port 1 View  Result Date: 02/12/2017 CLINICAL DATA:  Status post aortic valve replacement. Aortic stenosis. EXAM: PORTABLE CHEST 1 VIEW COMPARISON:  February 07, 2007 FINDINGS: There is an aortic valve replacement. Central catheter tip is in the superior vena cava. No pneumothorax. There is slight scarring in the left base. There is no edema or consolidation. Heart size and pulmonary vascularity are normal. No adenopathy. Patient has undergone previous  kyphoplasty procedures at T10 and L2, stable. IMPRESSION: Status post aortic valve replacement. Central catheter tip in superior vena cava. No pneumothorax. Scarring left base. No edema or consolidation. Stable cardiac silhouette. There is aortic atherosclerosis. Aortic Atherosclerosis (ICD10-I70.0). Electronically Signed   By: Bretta Bang III M.D.   On: 02/12/2017 14:15    Cardiac Studies   TAVR OPERATIVE NOTE   Date of Procedure:                02/12/2017  Preoperative Diagnosis:      Severe Aortic Stenosis   Postoperative Diagnosis:    Same   Procedure:        Transcatheter Aortic Valve Replacement - Percutaneous Right Transfemoral Approach             Edwards Sapien 3 THV (size 23 mm, model # 9600TFX, serial # [id number])              Co-Surgeons:                        Alleen Borne, MD and Verne Carrow, MD  Pre-operative Echo Findings: ? Severe aortic stenosis ? Normal left ventricular systolic function  Post-operative Echo Findings: ? NO paravalvular leak ? Normal left ventricular systolic function ? New pericardial effusion 1cm unchanged after continued observation in OR  _____________________   Post op limited echo 02/12/17 Study Conclusions - Left ventricle: The cavity size was normal. There was moderate   concentric hypertrophy. Systolic function was vigorous. The   estimated ejection fraction was in the range of 65% to 70%. Wall   motion was normal; there were no regional wall motion   abnormalities. Doppler parameters are consistent with abnormal   left ventricular relaxation (grade 1 diastolic dysfunction). - Aortic valve: A Sapien 23mm TAVR bioprosthesis was present. The   prosthesis had a normal function. - Mitral valve: Calcified annulus. - Pericardium, extracardiac: A small pericardial effusion was   identified circumferential to the heart. There was no evidence of   hemodynamic compromise. Impressions: - Effusion is similar when  compared to prior.  _____________________  POD #1 echo 02/13/17 Study Conclusion: - Left ventricle: The cavity size was normal. Wall thickness was   increased in a pattern of severe LVH. Systolic function was   vigorous. The estimated ejection fraction was in the range of 65%   to 70%. Doppler parameters are consistent with abnormal left   ventricular relaxation (grade 1 diastolic dysfunction). - Aortic valve: A bioprosthesis was present. - Left atrium: The atrium was moderately dilated. - Pulmonary arteries: Systolic pressure was moderately increased.   PA peak pressure: 51 mm Hg (S). - Pericardium, extracardiac: A small pericardial effusion was   identified. Features were not  consistent with tamponade   physiology.   Patient Profile     Kelsey Le is a 84 y.o. female with a history of recurrent GI bleeds from AVMS, IDA, osteopersosis with multiple rib fractures from falls, chronic diastolic CHF and severe AS who [facility]17 for planned TAVR.  Assessment & Plan    Severe AS: s/p successful TAVR with a 23 mm Edwards Sapien THV via TF approach on 02/12/17. Post valve deployment, she developed a small pericardial effusion, which has remained stable. POD1 echo showed normally functioning TAVR valve with no PVL (gradients not listed) and small pericardial effusion with no tampondae. ECG with new LBBB but no high grade block. No ASA or plavix given history of GI bleed and pericardial effusion.  Pericardial effusion: stable by echo yesterday. No evidence of tamponade physiology. She is having ongoing pleuritic chest pain, which may be related to pericardial inflammation. She did have some improvement with ibuprofen yesterday. Will order it scheduled as well as colchicine 0.6mg  daily.   Hypotension: had an episode of hypotension overnight requiring pressors. This has resolved. Continue to monitor  AKI: creat bumped from 0.84--> 1.23, possibly pre renal from hypotension.  Continue to monitor closely, esp while using ibuprofen.   History of recurrent GI bleeds: Hg has remained stable ~ 9. Continue to monitor   Signed, Cline CrockKathryn Thompson, PA-C  02/14/2017, 8:59 AM  Pager [phone]  I have personally seen and examined this patient. I agree with the assessment and plan as outlined above.  I agree that her sharp chest pain is most likely due to pericardial irritation from her pericardial effusion. The effusion has not changed in size in the 24 hours following the procedure. Will continue anti-inflammatory drugs and repeat echo tomorrow to assess effusion. We are not using ASA or Plavix given h/o GI bleeding due to AVMs (life threatening bleeds documented extensively in prior outpatient notes).  BP stable this am. H/H stable.   Verne CarrowChristopher Chaysen Tillman 02/14/2017 10:33 AM

## 2017-02-14 NOTE — Care Management Note (Addendum)
Case Management Note  Patient Details  Name: Kelsey Le MRN: [id number] Date of Birth: 10/22/1930  Subjective/Objective:   Pt is s/p TAVR               Action/Plan:   PTA from home independent, uses walker to assist with ambulation.  Pt has had recent falls - PT eval ordered.   Expected Discharge Date:                  Expected Discharge Plan:  Skilled Nursing Facility  In-House Referral:  Clinical Social Work  Discharge planning Services  CM Consult  Post Acute Care Choice:    Choice offered to:     DME Arranged:    DME Agency:     HH Arranged:    HH Agency:     Status of Service:     If discussed at [employer] Meetings, dates discussed:    Additional Comments: 02/15/2017 PT eval pt - no HH recommended.  Choice given for [facility] chosen - agency contacted and referral accepted.  Pt will have 24 hour support provided by family.   Cherylann ParrClaxton, Enrique Weiss S, RN 02/14/2017, 3:23 PM

## 2017-02-15 ENCOUNTER — Other Ambulatory Visit (HOSPITAL_COMMUNITY): Payer: Medicare Other

## 2017-02-15 ENCOUNTER — Inpatient Hospital Stay (HOSPITAL_COMMUNITY): Payer: Medicare Other

## 2017-02-15 DIAGNOSIS — I340 Nonrheumatic mitral (valve) insufficiency: Secondary | ICD-10-CM

## 2017-02-15 DIAGNOSIS — I313 Pericardial effusion (noninflammatory): Secondary | ICD-10-CM

## 2017-02-15 DIAGNOSIS — I3139 Other pericardial effusion (noninflammatory): Secondary | ICD-10-CM

## 2017-02-15 LAB — BASIC METABOLIC PANEL
ANION GAP: 9 (ref 5–15)
BUN: 23 mg/dL — ABNORMAL HIGH (ref 6–20)
CO2: 23 mmol/L (ref 22–32)
Calcium: 8.5 mg/dL — ABNORMAL LOW (ref 8.9–10.3)
Chloride: 109 mmol/L (ref 101–111)
Creatinine, Ser: 1.08 mg/dL — ABNORMAL HIGH (ref 0.44–1.00)
GFR calc Af Amer: 53 mL/min — ABNORMAL LOW (ref >60)
GFR, EST NON AFRICAN AMERICAN: 46 mL/min — AB (ref >60)
Glucose, Bld: 100 mg/dL — ABNORMAL HIGH (ref 65–99)
POTASSIUM: 3.5 mmol/L (ref 3.5–5.1)
Sodium: 141 mmol/L (ref 135–145)

## 2017-02-15 LAB — CBC
HEMATOCRIT: 29.6 % — AB (ref 36.0–46.0)
Hemoglobin: 9.4 g/dL — ABNORMAL LOW (ref 12.0–15.0)
MCH: 31 pg (ref 26.0–34.0)
MCHC: 31.8 g/dL (ref 30.0–36.0)
MCV: 97.7 fL (ref 78.0–100.0)
Platelets: 121 10*3/uL — ABNORMAL LOW (ref 150–400)
RBC: 3.03 MIL/uL — ABNORMAL LOW (ref 3.87–5.11)
RDW: 15.9 % — AB (ref 11.5–15.5)
WBC: 7.6 10*3/uL (ref 4.0–10.5)

## 2017-02-15 LAB — ECHOCARDIOGRAM LIMITED
HEIGHTINCHES: 61 in
WEIGHTICAEL: 1781.32 [oz_av]

## 2017-02-15 MED ORDER — COLCHICINE 0.6 MG PO TABS: 0.6000 mg | ORAL_TABLET | Freq: Every day | ORAL | 0 refills | Status: DC

## 2017-02-15 MED ORDER — POTASSIUM CHLORIDE CRYS ER 20 MEQ PO TBCR
20.0000 meq | EXTENDED_RELEASE_TABLET | ORAL | Status: AC
  Administered 2017-02-15 (×3): 20 meq via ORAL
  Filled 2017-02-15 (×3): qty 1

## 2017-02-15 NOTE — Evaluation (Signed)
Physical Therapy Evaluation Patient Details Name: Kelsey Le MRN: [id number] DOB: 1930/07/23 Today's Date: 02/15/2017   History of Present Illness  Pt is an 84 y/o female s/p TAVR. PMH including but not limited to aortic stenosis and kyphoplasty in 2016.  Clinical Impression  Pt presented sitting OOB in recliner chair, awake and willing to participate in therapy session. Pt's son present throughout session as well. Prior to admission, pt reported that she ambulated with use of RW and was independent with ADLs. Pt stated that she continues to drive. Pt currently requires supervision for safety with transfers and ambulated in hallway with use of RW and min guard. Plan is for pt to d/c home with family who will provide 24/7 supervision assistance initially. PT will continue to follow acutely to ensure a safe d/c home.     Follow Up Recommendations No PT follow up;Supervision for mobility/OOB    Equipment Recommendations  Rolling walker with 5" wheels (pt's RW is ~82 yrs old)   Recommendations for Other Services       Precautions / Restrictions Precautions Precautions: Fall Restrictions Weight Bearing Restrictions: No      Mobility  Bed Mobility               General bed mobility comments: pt sitting OOB in recliner chair upon arrival  Transfers Overall transfer level: Needs assistance Equipment used: None Transfers: Sit to/from Stand Sit to Stand: Supervision         General transfer comment: supervision for safety, pt a bit impulsive but steady  Ambulation/Gait Ambulation/Gait assistance: Min guard Ambulation Distance (Feet): 100 Feet Assistive device: Rolling walker (2 wheeled) Gait Pattern/deviations: Step-through pattern;Decreased stride length;Trunk flexed Gait velocity: variable Gait velocity interpretation: at or above normal speed for age/gender General Gait Details: pt steady with RW, cueing to maintain bilateral UEs on hand grips  Stairs            Wheelchair Mobility    Modified Rankin (Stroke Patients Only)       Balance Overall balance assessment: Needs assistance;History of Falls Sitting-balance support: Feet supported Sitting balance-[name] Scale: Good     Standing balance support: During functional activity;No upper extremity supported Standing balance-[name] Scale: Fair                               Pertinent Vitals/Pain Pain Assessment: No/denies pain    Home Living Family/patient expects to be discharged to:: Private residence Living Arrangements: Alone Available Help at Discharge: Family;Available 24 hours/day Type of Home: Apartment Home Access: Elevator     Home Layout: One level Home Equipment: Emergency planning/management officer - 2 wheels;Other (comment)(her RW is ~82 years old) Additional Comments: plan is for pt d/c home with her daughter for a while and then her son    Prior Function Level of Independence: Independent with assistive device(s)         Comments: pt ambulates with RW, continues to drive, has a small Programmer, applications [employer]ance        Extremity/Trunk Assessment   Upper Extremity Assessment Upper Extremity Assessment: Overall WFL for tasks assessed    Lower Extremity Assessment Lower Extremity Assessment: Overall WFL for tasks assessed    Cervical / Trunk Assessment Cervical / Trunk Assessment: Kyphotic  Communication   Communication: HOH  Cognition Arousal/Alertness: Awake/alert Behavior During Therapy: WFL for tasks assessed/performed Overall Cognitive Status: Within Functional Limits for tasks assessed  General Comments      Exercises     Assessment/Plan    PT Assessment Patient needs continued PT services  PT Problem List Decreased balance;Decreased mobility;Decreased coordination;Decreased safety awareness       PT Treatment Interventions DME instruction;Gait training;Stair training;Therapeutic  activities;Therapeutic exercise;Functional mobility training;Balance training;Neuromuscular re-education;Patient/family education    PT Goals (Current goals can be found in the Care Plan section)  Acute Rehab PT Goals Patient Stated Goal: return home today PT Goal Formulation: With patient/family Time For Goal Achievement: 03/01/17 Potential to Achieve Goals: Good    Frequency Min 3X/week   Barriers to discharge        Co-evaluation               AM-PAC PT "6 Clicks" Daily Activity  Outcome Measure Difficulty turning over in bed (including adjusting bedclothes, sheets and blankets)?: A Little Difficulty moving from lying on back to sitting on the side of the bed? : A Little Difficulty sitting down on and standing up from a chair with arms (e.g., wheelchair, bedside commode, etc,.)?: A Little Help needed moving to and from a bed to chair (including a wheelchair)?: None Help needed walking in hospital room?: A Little Help needed climbing 3-5 steps with a railing? : A Little 6 Click Score: 19    End of Session Equipment Utilized During Treatment: Gait belt Activity Tolerance: Patient tolerated treatment well Patient left: in chair;with call bell/phone within reach;with chair alarm set;with family/visitor present Nurse Communication: Mobility status PT Visit Diagnosis: Other abnormalities of gait and mobility (R26.89)    Time: [id number] PT Time Calculation (min) (ACUTE ONLY): 17 min   Charges:   PT Evaluation $PT Eval Moderate Complexity: 1 Mod     PT G Codes:        GranbyJennifer Myrtle Haller, PT, DPT [phone]   Alessandra BevelsJennifer M Becka Lagasse 02/15/2017, 11:52 AM

## 2017-02-15 NOTE — Progress Notes (Signed)
[facility]   MULTIDISCIPLINARY HEART VALVE TEAM  Patient Name: Kelsey Le Date of Encounter: 02/15/2017  Primary Cardiologist: Dr. Kirke CorinArida / Dr. Clifton JamesMcAlhany & Dr. Laneta SimmersBartle (TAVR)  Hospital Problem List     Principal Problem:   S/P TAVR (transcatheter aortic valve replacement) Active Problems:   Severe aortic stenosis   Iron deficiency anemia   Acute on chronic diastolic heart failure (HCC)   Arteriovenous malformation (AVM)   Pericardial effusion     Subjective   Feeling much better. CP improved. Wants to go home.   Inpatient Medications    Scheduled Meds: . acetaminophen  1,000 mg Oral Q6H   Or  . acetaminophen (TYLENOL) oral liquid 160 mg/5 mL  1,000 mg Per Tube Q6H  . colchicine  0.6 mg Oral Daily  . ibuprofen  600 mg Oral TID  . pantoprazole  40 mg Oral Daily  . potassium chloride  20 mEq Oral Q4H   Continuous Infusions: . sodium chloride Stopped (02/12/17 2145)  . lactated ringers    . nitroGLYCERIN    . phenylephrine (NEO-SYNEPHRINE) Adult infusion Stopped (02/14/17 0600)   PRN Meds: lactated ringers, morphine injection, ondansetron (ZOFRAN) IV, oxyCODONE, traMADol   Vital Signs    Vitals:   02/15/17 0600 02/15/17 0700 02/15/17 0756 02/15/17 0800  BP: (!) 111/42 (!) 86/77  (!) 128/56  Pulse:      Resp: 19 18  20   Temp:   99.1 F (37.3 C)   TempSrc:   Oral   SpO2: 100% 98%  97%  Weight: 111 lb 5.3 oz (50.5 kg)     Height:        Intake/Output Summary (Last 24 hours) at 02/15/2017 0955 Last data filed at 02/15/2017 0000 Gross per 24 hour  Intake -  Output 275 ml  Net -275 ml   Filed Weights   02/12/17 0821 02/13/17 0500 02/15/17 0600  Weight: 110 lb (49.9 kg) 111 lb 12.4 oz (50.7 kg) 111 lb 5.3 oz (50.5 kg)    Physical Exam   GEN: Well nourished, well developed, in no acute distress. Thin, frail,  white female sitting up in bed HEENT: Grossly normal.  Neck: Supple, no JVD, carotid bruits, or masses. Cardiac: RRR, no murmurs,  possible soft rub. No gallops. No clubbing, cyanosis, edema.  Radials/DP/PT 2+ and equal bilaterally.  Respiratory:  Respirations regular and unlabored, clear to auscultation bilaterally. GI: Soft, nontender, nondistended, BS + x 4. MS: no deformity or atrophy. Skin: warm and dry, no rash. Groin sites stable  Neuro:  Strength and sensation are intact. Psych: AAOx3.  Normal affect.  Labs    CBC Recent Labs    02/14/17 0415 02/15/17 0226  WBC 9.5 7.6  HGB 9.4* 9.4*  HCT 30.0* 29.6*  MCV 98.4 97.7  PLT 125* 121*   Basic Metabolic Panel Recent Labs    16/10/9600/13/19 0625 02/14/17 0415 02/15/17 0226  NA 139 137 141  K 3.7 3.7 3.5  CL 107 105 109  CO2 21* 21* 23  GLUCOSE 83 99 100*  BUN 21* 27* 23*  CREATININE 0.84 1.23* 1.08*  CALCIUM 8.6* 8.7* 8.5*  MG 1.8  --   --    Liver Function Tests No results for input(s): AST, ALT, ALKPHOS, BILITOT, PROT, ALBUMIN in the last 72 hours. No results for input(s): LIPASE, AMYLASE in the last 72 hours. Cardiac Enzymes No results for input(s): CKTOTAL, CKMB, CKMBINDEX, TROPONINI in the last 72 hours. BNP Invalid input(s): POCBNP D-Dimer No  results for input(s): DDIMER in the last 72 hours. Hemoglobin A1C No results for input(s): HGBA1C in the last 72 hours. Fasting Lipid Panel No results for input(s): CHOL, HDL, LDLCALC, TRIG, CHOLHDL, LDLDIRECT in the last 72 hours. Thyroid Function Tests No results for input(s): TSH, T4TOTAL, T3FREE, THYROIDAB in the last 72 hours.  Invalid input(s): FREET3  Telemetry    sinus - Personally Reviewed  ECG    Sinus with new LBBB, HR 68 - Personally Reviewed  Radiology    No results found.  Cardiac Studies   TAVR OPERATIVE NOTE   Date of Procedure:                02/12/2017  Preoperative Diagnosis:      Severe Aortic Stenosis   Postoperative Diagnosis:    Same   Procedure:        Transcatheter Aortic Valve Replacement - Percutaneous Right Transfemoral Approach              Edwards Sapien 3 THV (size 23 mm, model # 9600TFX, serial # [id number])              Co-Surgeons:                        Alleen Borne, MD and Verne Carrow, MD  Pre-operative Echo Findings: ? Severe aortic stenosis ? Normal left ventricular systolic function  Post-operative Echo Findings: ? NO paravalvular leak ? Normal left ventricular systolic function ? New pericardial effusion 1cm unchanged after continued observation in OR  _____________________   Post op limited echo 02/12/17 Study Conclusions - Left ventricle: The cavity size was normal. There was moderate   concentric hypertrophy. Systolic function was vigorous. The   estimated ejection fraction was in the range of 65% to 70%. Wall   motion was normal; there were no regional wall motion   abnormalities. Doppler parameters are consistent with abnormal   left ventricular relaxation (grade 1 diastolic dysfunction). - Aortic valve: A Sapien 23mm TAVR bioprosthesis was present. The   prosthesis had a normal function. - Mitral valve: Calcified annulus. - Pericardium, extracardiac: A small pericardial effusion was   identified circumferential to the heart. There was no evidence of   hemodynamic compromise. Impressions: - Effusion is similar when compared to prior.  _____________________  POD #1 echo 02/13/17 Study Conclusion: - Left ventricle: The cavity size was normal. Wall thickness was   increased in a pattern of severe LVH. Systolic function was   vigorous. The estimated ejection fraction was in the range of 65%   to 70%. Doppler parameters are consistent with abnormal left   ventricular relaxation (grade 1 diastolic dysfunction). - Aortic valve: A bioprosthesis was present. - Left atrium: The atrium was moderately dilated. - Pulmonary arteries: Systolic pressure was moderately increased.   PA peak pressure: 51 mm Hg (S). - Pericardium, extracardiac: A small pericardial effusion was   identified. Features  were not consistent with tamponade   physiology.   Patient Profile     Kelsey Le is a 84 y.o. female with a history of recurrent GI bleeds from AVMS, IDA, osteopersosis with multiple rib fractures from falls, chronic diastolic CHF and severe AS who presented to [facility] on 02/12/17 for planned TAVR.  Assessment & Plan    Severe AS: s/p successful TAVR with a 23 mm Edwards Sapien THV via TF approach on 02/12/17. Post valve deployment, she developed a small pericardial effusion, which has remained stable. POD1  echo showed normally functioning TAVR valve with no PVL (gradients not listed) and small pericardial effusion with no tampondae. ECG with new LBBB but no high grade block. No ASA or plavix given history of GI bleeds (life threatening bleeds documented extensively in prior outpatient notes).  Pericardial effusion: stable by echo 2/13. No evidence of tamponade physiology. She was having ongoing pleuritic chest pain, which may be related to pericardial inflammation. She has improved on PRN ibuprofen and colchcine 0.6mg  daily. Will repeat limited echo this AM.   Hypotension: she had an episode of hypotension overnight 2/14 requiring pressors, this has resolved.   AKI: creat 0.84--> 1.23--> 1.08. Continue to monitor  History of recurrent GI bleeds: Hg has remained stable ~ 9.4. Continue to monitor  Dispo: she would really like to go home. If she continues to do well and limited echo today stable, she may be able to be discharged home this afternoon. Otherwise will plan to transfer to the floor and DC home tomorrow AM. I will check in this afternoon. RN concerned with stability and PT consult ordered.   Signed, Cline Crock, PA-C  02/15/2017, 9:55 AM  Pager [phone]  I have personally seen and examined this patient with Cline Crock, PA-C. I agree with the assessment and plan as outlined above. She is stable this am. Chest pain is resolved. She is only c/o the food here. Renal function  normal. H/H stable. Repeat echo today to assess effusion. Ambulate. Possible d/c home today.   Verne Carrow 02/15/2017 11:09 AM

## 2017-02-15 NOTE — Progress Notes (Signed)
CARDIAC REHAB PHASE I   PRE:  Rate/Rhythm: 99 SR  BP:  Supine:   Sitting: 155/62  Standing:    SaO2: 95%RA  MODE:  Ambulation: 190 ft   POST:  Rate/Rhythm: 120 ST  BP:  Supine:   Sitting: 129/65  Standing:    SaO2: 98%RA 1420-140 Pt walked 190 ft with rolling walker and minimal asst. Tolerated well. To recliner after walk. Offered CRP 2 and son would like for pt to consider. [place] refer to [employer] [place] [employer] program. Encouraged walking with assistance.   Luetta Nuttingharlene Chennel Olivos, RN BSN  02/15/2017 2:45 PM  '

## 2017-02-15 NOTE — Discharge Summary (Signed)
[facility]   MULTIDISCIPLINARY HEART VALVE TEAM   Discharge Summary    Patient ID: Kelsey Le,  MRN: [id number], DOB/AGE: [birth year] 84 y.o.  Admit date: 02/12/2017 Discharge date: 02/15/2017  Primary Care Provider: Marguarite Arbour Primary Cardiologist: Dr. Kirke Corin / Dr. Clifton James & Dr. Laneta Simmers (TAVR)   Discharge Diagnoses    Principal Problem:   S/P TAVR (transcatheter aortic valve replacement) Active Problems:   Severe aortic stenosis   Iron deficiency anemia   Acute on chronic diastolic heart failure (HCC)   Arteriovenous malformation (AVM)   Pericardial effusion   Allergies Allergies  Allergen Reactions  . Aspirin Other (See Comments)    GI bleeding   . Penicillins Rash and Other (See Comments)    PATIENT HAS HAD A PCN REACTION WITH IMMEDIATE RASH, FACIAL/TONGUE/THROAT SWELLING, SOB, OR LIGHTHEADEDNESS WITH HYPOTENSION:  #  #  #  YES  #  #  #   Has patient had a PCN reaction causing severe rash involving mucus membranes or skin necrosis: No Has patient had a PCN reaction that required hospitalization: No Has patient had a PCN reaction occurring within the last 10 years: No      History of Present Illness     Kelsey Le is a 84 y.o. female with a history of recurrent GI bleeds from AVMS, IDA, osteopersosis with multiple rib fractures from falls, chronic diastolic CHF and severe AS who presented to [facility] on 02/12/17 for planned TAVR.  She has been followed by Dr. Kirke Corin for aortic stenosis with periodic echocardiograms. Her echocardiogram on 08/12/2015 showed moderate aortic stenosis with a mean gradient of 28 mmHg and normal left ventricular systolic function. A follow-up echocardiogram on 08/28/2016 showed progression to severe aortic stenosis with a mean gradient of 65 mm peak gradient of 106 mmHg. She was seen in the emergency department at [facility] in September 2018 with headache and dizziness. A CTA of the head showed a small (2 mm) intracranial aneurysm  and mild carotid disease. She was seen by neurosurgery and no further workup was recommended. She also was noted to have a 10 mm nodular lesion in the vallecula which was evaluated by ENT and no further workup was recommended. She was having progressive shortness of breath and fatigue with generalized weakness at this time and was referred to Dr. Clifton James for consideration of TAVR. She underwent cardiac catheterization on 10/05/2016 which showed no evidence of coronary artery disease. There was severe aortic stenosis with a mean gradient of 42 mmHg, a peak to peak gradient of 52 mmHg, and a calculated aortic valve area of 0.69 cm. She was started on aspirin 81 mg daily after the catheterization with her history of AVMs and bleeding to see if she would tolerate that. Unfortunately she was admitted to [facility] on 10/12/1998 1:18 week of aspirin therapy with weakness and melena with a drop in her hemoglobin from 11.9-6.1. The aspirin was stopped. She underwent EGD on 10/12/2016 showing gastritis only. Gastroenterology thought that her bleeding was most likely from her AVMs. She was transfused 3 units of PRBCs and her hemoglobin increased to 10 prior to discharge. Her melena resolved off of aspirin but she was readmitted on 10/26/2016 with recurrent GI bleeding with a hemoglobin of 7.0. She was transfused and had upper endoscopy and was subsequently treated for H. pylori. Her bleeding was again felt to be due to AVMs. Then she was readmitted to [facility] on 12/21/2016 with recurrent anemia and GI bleeding with a  hemoglobin of 6.9. She was transfused and discharged home. She had had a capsule study in May 2018 that reportedly showed obvious AVMs. Since her admission in December 2018 she has not had any further obvious GI bleeding and no melena. She has a son and daughter who keep a close eye on her and are very devoted. She uses a walker to help her with balance since she has had multiple falls in the past. She has had both hips  replaced due to falls. She has had fractures of both wrist due to falls. She has had vertebral compression fractures related to activity.   Her Hg remained stable and she was evaluated by the multidisciplinary valve team who felt she was a suitable candidate for TAVR, which was set up for 02/12/17. Given history of extensive GI bleeding, plan was to avoid post TAVR antiplatelet therapy. This was discussed with the patient and family at length and they understood and accepted risks of valve replacement with no DAPT afterwards.     Hospital Course     Consultants: none  Severe AS: s/p successful TAVR with a 23 mm Edwards Sapien THV via TF approach on 02/12/17. Post valve deployment, she developed a small pericardial effusion, which has remained stable. POD1 echo showed normally functioning TAVR valve with no PVL (gradients not listed) and small pericardial effusion with no tampondae. ECG with new LBBB but no high grade block. No ASA or plavix given history of GI bleeds (life threatening bleeds documented extensively in prior outpatient notes). 1 week TOC visit arranged in valve clinic.   Pericardial effusion: stable by serial echocardiograms. Limited echo today not formally read at the time of discharge but reviewed by Dr. Clifton James and felt to be stable with no evidence of tamponade physiology. She had pleuritic chest pain post operatively, which was felt to be related to pericardial inflammation. She improved on PRN ibuprofen and colchcine 0.6mg  daily. I will continue colchcine x 1 week and she can take PRN ibuprofen if chest pain returns.   Hypotension: she had an episode of hypotension overnight 2/14 requiring pressors. This has resolved.   AKI: creat 0.84--> 1.23--> 1.08. Will check BMET at follow up.   History of recurrent GI bleeds: Hg has remained stable ~ 9.4. Will check CBC at follow up.   Balance issues: PT has recommended a rolling walker which has been ordered.   The patient has  had an uncomplicated hospital course and is recovering well. The femoral catheter sites are stable. She has been seen by Dr. Clifton James today and deemed ready for discharge home. All follow-up appointments have been scheduled. Discharge medications are listed below.  _____________  Discharge Vitals Blood pressure 137/78, pulse (!) 103, temperature 98.8 F (37.1 C), temperature source Oral, resp. rate (!) 27, height 5\' 1"  (1.549 m), weight 111 lb 5.3 oz (50.5 kg), SpO2 97 %.  Filed Weights   02/12/17 0821 02/13/17 0500 02/15/17 0600  Weight: 110 lb (49.9 kg) 111 lb 12.4 oz (50.7 kg) 111 lb 5.3 oz (50.5 kg)    Labs & Radiologic Studies     CBC Recent Labs    02/14/17 0415 02/15/17 0226  WBC 9.5 7.6  HGB 9.4* 9.4*  HCT 30.0* 29.6*  MCV 98.4 97.7  PLT 125* 121*   Basic Metabolic Panel Recent Labs    40/98/11 0625 02/14/17 0415 02/15/17 0226  NA 139 137 141  K 3.7 3.7 3.5  CL 107 105 109  CO2 21* 21* 23  GLUCOSE 83 99 100*  BUN 21* 27* 23*  CREATININE 0.84 1.23* 1.08*  CALCIUM 8.6* 8.7* 8.5*  MG 1.8  --   --    Liver Function Tests No results for input(s): AST, ALT, ALKPHOS, BILITOT, PROT, ALBUMIN in the last 72 hours. No results for input(s): LIPASE, AMYLASE in the last 72 hours. Cardiac Enzymes No results for input(s): CKTOTAL, CKMB, CKMBINDEX, TROPONINI in the last 72 hours. BNP Invalid input(s): POCBNP D-Dimer No results for input(s): DDIMER in the last 72 hours. Hemoglobin A1C No results for input(s): HGBA1C in the last 72 hours. Fasting Lipid Panel No results for input(s): CHOL, HDL, LDLCALC, TRIG, CHOLHDL, LDLDIRECT in the last 72 hours. Thyroid Function Tests No results for input(s): TSH, T4TOTAL, T3FREE, THYROIDAB in the last 72 hours.  Invalid input(s): FREET3  Dg Chest 2 View  Result Date: 02/06/2017 CLINICAL DATA:  Severe aortic stenosis.  Preoperative study. EXAM: CHEST  2 VIEW COMPARISON:  Chest x-ray dated November 17, 2015. FINDINGS: Stable mild  cardiomegaly. Normal pulmonary vascularity. Atherosclerotic calcification of the aortic arch. No focal consolidation, pleural effusion, or pneumothorax. Prior T10 and L2 vertebroplasties. Unchanged mild compression deformity of L1. IMPRESSION: No active cardiopulmonary disease. Electronically Signed   By: Obie Dredge M.D.   On: 02/06/2017 17:01   Ct Coronary Morph W/cta Cor W/score W/ca W/cm &/or Wo/cm  Addendum Date: 01/29/2017   ADDENDUM REPORT: 01/29/2017 13:58 CLINICAL DATA:  84 year old female with severe aortic stenosis being evaluated for a TAVR procedure. EXAM: Cardiac TAVR CT TECHNIQUE: The patient was scanned on a [employer]. A 120 kV retrospective scan was triggered in the descending thoracic aorta at 111 HU's. Gantry rotation speed was 250 msecs and collimation was .6 mm. No beta blockade or nitro were given. The 3D data set was reconstructed in 5% intervals of the R-R cycle. Systolic and diastolic phases were analyzed on a dedicated work station using MPR, MIP and VRT modes. The patient received 80 cc of contrast. FINDINGS: Aortic Valve: Severely thickened and calcified aortic valve with severely restricted leaflet opening and minimal calcifications extending into the LVOT under the non-coronary leaflet. Aorta: Normal size, mild diffuse calcifications and no dissection. Sinotubular Junction: 25 x 23 mm Ascending Thoracic Aorta: 28 x 27 mm Aortic Arch: 26 x 24 mm Descending Thoracic Aorta: 22 x 22 mm Sinus of Valsalva Measurements: Non-coronary: 28 mm Right -coronary: 25 mm Left -coronary: 27 mm Sinus of Valsalva Height: Right -coronary: 28 mm Left -coronary: 19 mm Coronary Artery Height above Annulus: Left Main: 10 mm Right Coronary: 11 mm Virtual Basal Annulus Measurements: Maximum/Minimum Diameter: 24.4 x 19.7 mm Mean Diameter: 22.0 mm Perimeter: 71.4 mm Area: 379 mm2 Optimum Fluoroscopic Angle for Delivery: LAO 19 CAU 17. IMPRESSION: 1. Severely thickened and calcified aortic  valve with severely restricted leaflet opening and minimal calcifications extending into the LVOT under the non-coronary leaflet. Annular measurements suitable for delivery of a 23 mm Edwards-SAPIEN 3 valve and a 26 mm Evolut Pro valve. 2. Sufficient coronary to annulus distance. 3. Optimum Fluoroscopic Angle for Delivery:  LAO 19 CAU 17. 4. No thrombus in the left atrial appendage. Electronically Signed   By: Tobias Alexander   On: 01/29/2017 13:58   Result Date: 01/29/2017 EXAM: OVER-READ INTERPRETATION  CT CHEST The following report is an over-read performed by radiologist Dr. Trudie Reed of [facility] on 01/29/2017. This over-read does not include interpretation of cardiac or coronary anatomy or pathology. The coronary calcium score/coronary CTA  interpretation by the cardiologist is attached. COMPARISON:  None. FINDINGS: Extracardiac findings will be described under dictation for contemporaneously obtained CTA chest, abdomen and pelvis. IMPRESSION: Please see separate dictation for contemporaneously obtained CTA chest, abdomen and pelvis for full description of relevant extracardiac findings. Electronically Signed: By: Trudie Reed M.D. On: 01/29/2017 09:53   Dg Chest Port 1 View  Result Date: 02/12/2017 CLINICAL DATA:  Status post aortic valve replacement. Aortic stenosis. EXAM: PORTABLE CHEST 1 VIEW COMPARISON:  February 07, 2007 FINDINGS: There is an aortic valve replacement. Central catheter tip is in the superior vena cava. No pneumothorax. There is slight scarring in the left base. There is no edema or consolidation. Heart size and pulmonary vascularity are normal. No adenopathy. Patient has undergone previous kyphoplasty procedures at T10 and L2, stable. IMPRESSION: Status post aortic valve replacement. Central catheter tip in superior vena cava. No pneumothorax. Scarring left base. No edema or consolidation. Stable cardiac silhouette. There is aortic atherosclerosis. Aortic  Atherosclerosis (ICD10-I70.0). Electronically Signed   By: Bretta Bang III M.D.   On: 02/12/2017 14:15   Ct Angio Chest Aorta W &/or Wo Contrast  Result Date: 01/29/2017 CLINICAL DATA:  84 year old female history of severe aortic valve stenosis. Preprocedural study prior to potential transcatheter aortic valve replacement (TAVR). EXAM: CT ANGIOGRAPHY CHEST, ABDOMEN AND PELVIS TECHNIQUE: Multidetector CT imaging through the chest, abdomen and pelvis was performed using the standard protocol during bolus administration of intravenous contrast. Multiplanar reconstructed images and MIPs were obtained and reviewed to evaluate the vascular anatomy. CONTRAST:  95 mL of Isovue 370. COMPARISON:  CT the abdomen and pelvis 01/29/2013. FINDINGS: CTA CHEST FINDINGS Cardiovascular: Heart size is mildly enlarged with left atrial dilatation. There is no significant pericardial fluid, thickening or pericardial calcification. Aortic atherosclerosis. Severe thickening calcification of the aortic valve. Mediastinum/Lymph Nodes: No pathologically enlarged mediastinal or hilar lymph nodes. Esophagus is unremarkable in appearance. No axillary lymphadenopathy. Lungs/Pleura: Small calcified granuloma in the right upper lobe in an area of focal architectural distortion which likely reflects some mild post infectious or inflammatory scarring. No other suspicious appearing pulmonary nodules or masses. No acute consolidative airspace disease. No pleural effusions. Musculoskeletal/Soft Tissues: Chronic compression fracture of T10 with post vertebroplasty changes and approximately 40% loss of anterior vertebral body height. There are no aggressive appearing lytic or blastic lesions noted in the visualized portions of the skeleton. CTA ABDOMEN AND PELVIS FINDINGS Hepatobiliary: No suspicious cystic or solid hepatic lesions. No intra or extrahepatic biliary ductal dilatation. Gallbladder is nearly completely decompressed, but otherwise  unremarkable in appearance. Pancreas: No pancreatic mass. No pancreatic ductal dilatation. No pancreatic or peripancreatic fluid or inflammatory changes. Spleen: Unremarkable. Adrenals/Urinary Tract: Bilateral kidneys and left adrenal gland are normal in appearance. 11 x 8 mm right adrenal nodule, similar to prior study, at which point this was low-attenuation, most compatible with an adenoma. No hydroureteronephrosis. Urinary bladder is normal in appearance. Stomach/Bowel: Normal appearance of the stomach. No pathologic dilatation of small bowel or colon. Numerous colonic diverticulae are noted, particularly in the sigmoid colon, without surrounding inflammatory changes to suggest an acute diverticulitis at this time. The appendix is not confidently identified and may be surgically absent. Regardless, there are no inflammatory changes noted adjacent to the cecum to suggest the presence of an acute appendicitis at this time. Vascular/Lymphatic: Aortic atherosclerosis, without evidence of aneurysm or dissection in the abdominal or pelvic vasculature. Vascular findings and measurements pertinent to potential TAVR procedure, as detailed below. Celiac axis, superior mesenteric  artery, inferior mesenteric artery and single renal arteries bilaterally are all widely patent without hemodynamically significant stenosis. No lymphadenopathy noted in the abdomen or pelvis. Reproductive: Prostate gland and seminal vesicles are unremarkable in appearance. Other: No significant volume of ascites.  No pneumoperitoneum. Musculoskeletal: Chronic compression fractures of L1 and L2 with approximately 30% loss of height at both levels and post vertebroplasty changes at L2. Orthopedic fixation hardware in the proximal femurs bilaterally. There are no aggressive appearing lytic or blastic lesions noted in the visualized portions of the skeleton. VASCULAR MEASUREMENTS PERTINENT TO TAVR: AORTA: Minimal Aortic Diameter-11 x 11 mm Severity of  Aortic Calcification-severe RIGHT PELVIS: Right Common Iliac Artery - Minimal Diameter-8.8 x 8.0 mm Tortuosity-mild Calcification-moderate Right External Iliac Artery - Minimal Diameter-6.5 x 6.3 mm Tortuosity-moderate Calcification-mild Right Common Femoral Artery - Minimal Diameter-7.4 x 6.8 mm Tortuosity-mild Calcification-none LEFT PELVIS: Left Common Iliac Artery - Minimal Diameter-8.9 x 8.1 mm Tortuosity-mild Calcification-moderate Left External Iliac Artery - Minimal Diameter-6.7 x 6.5 mm Tortuosity-moderate Calcification-none Left Common Femoral Artery - Minimal Diameter-7.0 x 7.1 mm Tortuosity-mild Calcification-none Review of the MIP images confirms the above findings. IMPRESSION: 1. Vascular findings and measurements pertinent to potential TAVR procedure, as detailed above. 2. Severe thickening and calcification of the aortic valve, compatible with the reported clinical history of severe aortic stenosis. 3. Mild cardiomegaly with mild left atrial dilatation. 4. Additional incidental findings, as above. Aortic Atherosclerosis (ICD10-I70.0). Electronically Signed   By: Trudie Reed M.D.   On: 01/29/2017 11:49   Ct Angio Abd/pel W/ And/or W/o  Result Date: 01/29/2017 CLINICAL DATA:  84 year old female history of severe aortic valve stenosis. Preprocedural study prior to potential transcatheter aortic valve replacement (TAVR). EXAM: CT ANGIOGRAPHY CHEST, ABDOMEN AND PELVIS TECHNIQUE: Multidetector CT imaging through the chest, abdomen and pelvis was performed using the standard protocol during bolus administration of intravenous contrast. Multiplanar reconstructed images and MIPs were obtained and reviewed to evaluate the vascular anatomy. CONTRAST:  95 mL of Isovue 370. COMPARISON:  CT the abdomen and pelvis 01/29/2013. FINDINGS: CTA CHEST FINDINGS Cardiovascular: Heart size is mildly enlarged with left atrial dilatation. There is no significant pericardial fluid, thickening or pericardial  calcification. Aortic atherosclerosis. Severe thickening calcification of the aortic valve. Mediastinum/Lymph Nodes: No pathologically enlarged mediastinal or hilar lymph nodes. Esophagus is unremarkable in appearance. No axillary lymphadenopathy. Lungs/Pleura: Small calcified granuloma in the right upper lobe in an area of focal architectural distortion which likely reflects some mild post infectious or inflammatory scarring. No other suspicious appearing pulmonary nodules or masses. No acute consolidative airspace disease. No pleural effusions. Musculoskeletal/Soft Tissues: Chronic compression fracture of T10 with post vertebroplasty changes and approximately 40% loss of anterior vertebral body height. There are no aggressive appearing lytic or blastic lesions noted in the visualized portions of the skeleton. CTA ABDOMEN AND PELVIS FINDINGS Hepatobiliary: No suspicious cystic or solid hepatic lesions. No intra or extrahepatic biliary ductal dilatation. Gallbladder is nearly completely decompressed, but otherwise unremarkable in appearance. Pancreas: No pancreatic mass. No pancreatic ductal dilatation. No pancreatic or peripancreatic fluid or inflammatory changes. Spleen: Unremarkable. Adrenals/Urinary Tract: Bilateral kidneys and left adrenal gland are normal in appearance. 11 x 8 mm right adrenal nodule, similar to prior study, at which point this was low-attenuation, most compatible with an adenoma. No hydroureteronephrosis. Urinary bladder is normal in appearance. Stomach/Bowel: Normal appearance of the stomach. No pathologic dilatation of small bowel or colon. Numerous colonic diverticulae are noted, particularly in the sigmoid colon, without surrounding inflammatory changes to suggest  an acute diverticulitis at this time. The appendix is not confidently identified and may be surgically absent. Regardless, there are no inflammatory changes noted adjacent to the cecum to suggest the presence of an acute  appendicitis at this time. Vascular/Lymphatic: Aortic atherosclerosis, without evidence of aneurysm or dissection in the abdominal or pelvic vasculature. Vascular findings and measurements pertinent to potential TAVR procedure, as detailed below. Celiac axis, superior mesenteric artery, inferior mesenteric artery and single renal arteries bilaterally are all widely patent without hemodynamically significant stenosis. No lymphadenopathy noted in the abdomen or pelvis. Reproductive: Prostate gland and seminal vesicles are unremarkable in appearance. Other: No significant volume of ascites.  No pneumoperitoneum. Musculoskeletal: Chronic compression fractures of L1 and L2 with approximately 30% loss of height at both levels and post vertebroplasty changes at L2. Orthopedic fixation hardware in the proximal femurs bilaterally. There are no aggressive appearing lytic or blastic lesions noted in the visualized portions of the skeleton. VASCULAR MEASUREMENTS PERTINENT TO TAVR: AORTA: Minimal Aortic Diameter-11 x 11 mm Severity of Aortic Calcification-severe RIGHT PELVIS: Right Common Iliac Artery - Minimal Diameter-8.8 x 8.0 mm Tortuosity-mild Calcification-moderate Right External Iliac Artery - Minimal Diameter-6.5 x 6.3 mm Tortuosity-moderate Calcification-mild Right Common Femoral Artery - Minimal Diameter-7.4 x 6.8 mm Tortuosity-mild Calcification-none LEFT PELVIS: Left Common Iliac Artery - Minimal Diameter-8.9 x 8.1 mm Tortuosity-mild Calcification-moderate Left External Iliac Artery - Minimal Diameter-6.7 x 6.5 mm Tortuosity-moderate Calcification-none Left Common Femoral Artery - Minimal Diameter-7.0 x 7.1 mm Tortuosity-mild Calcification-none Review of the MIP images confirms the above findings. IMPRESSION: 1. Vascular findings and measurements pertinent to potential TAVR procedure, as detailed above. 2. Severe thickening and calcification of the aortic valve, compatible with the reported clinical history of severe  aortic stenosis. 3. Mild cardiomegaly with mild left atrial dilatation. 4. Additional incidental findings, as above. Aortic Atherosclerosis (ICD10-I70.0). Electronically Signed   By: Trudie Reedaniel  Entrikin M.D.   On: 01/29/2017 11:49     Diagnostic Studies/Procedures   TAVR OPERATIVE NOTE   Date of Procedure:02/12/2017  Preoperative Diagnosis:Severe Aortic Stenosis   Postoperative Diagnosis:Same   Procedure:   Transcatheter Aortic Valve Replacement - PercutaneousRightTransfemoral Approach Edwards Sapien 3 THV (size 23mm, model # 9600TFX, serial # [id number])  Co-Surgeons:Bryan Jennefer BravoK. Bartle, MD and Verne Carrowhristopher McAlhany, MD  Pre-operative Echo Findings: ? Severe aortic stenosis ? Normalleft ventricular systolic function  Post-operative Echo Findings: ? NOparavalvular leak ? Normalleft ventricular systolic function ? New pericardial effusion 1cm unchanged after continued observation in OR  _____________________   Post op limited echo 02/12/17 Study Conclusions - Left ventricle: The cavity size was normal. There was moderate concentric hypertrophy. Systolic function was vigorous. The estimated ejection fraction was in the range of 65% to 70%. Wall motion was normal; there were no regional wall motion abnormalities. Doppler parameters are consistent with abnormal left ventricular relaxation (grade 1 diastolic dysfunction). - Aortic valve: A Sapien 23mm TAVR bioprosthesis was present. The prosthesis had a normal function. - Mitral valve: Calcified annulus. - Pericardium, extracardiac: A small pericardial effusion was identified circumferential to the heart. There was no evidence of hemodynamic compromise. Impressions: - Effusion is similar when compared to prior.  _____________________  POD #1 echo 02/13/17 Study Conclusion: - Left ventricle: The cavity size was normal. Wall  thickness was increased in a pattern of severe LVH. Systolic function was vigorous. The estimated ejection fraction was in the range of 65% to 70%. Doppler parameters are consistent with abnormal left ventricular relaxation (grade 1 diastolic dysfunction). - Aortic valve: A bioprosthesis was  present. - Left atrium: The atrium was moderately dilated. - Pulmonary arteries: Systolic pressure was moderately increased. PA peak pressure: 51 mm Hg (S). - Pericardium, extracardiac: A small pericardial effusion was identified. Features were not consistent with tamponade physiology.   _____________________   Limited echo 02/15/17: not formally read at the time of discharge by reviewed by Dr. Clifton James and felt to be stable.    Disposition   Pt is being discharged home today in good condition.  Follow-up Plans & Appointments    Follow-up Information    Janetta Hora, PA-C. Go on 02/20/2017.   Specialties:  Cardiology, Radiology Why:  @ 3pm Contact information: [address] [place] [address] [phone]          Discharge Instructions    Amb Referral to Cardiac Rehabilitation   Complete by:  As directed    Diagnosis:  Valve Replacement   Valve:  Aortic Comment - TAVR      Discharge Medications     Medication List    TAKE these medications   acetaminophen 325 MG tablet Commonly known as:  TYLENOL Take 325 mg by mouth every 6 (six) hours as needed for moderate pain or headache.   ALLEGRA ALLERGY CHILDRENS PO Take 1 tablet by mouth daily.   colchicine 0.6 MG tablet Take 1 tablet (0.6 mg total) by mouth daily. Start taking on:  02/16/2017   vitamin B-12 1000 MCG tablet Commonly known as:  CYANOCOBALAMIN Take 1,000 mcg by mouth daily.   VITAMIN D3 PO Take 1 capsule by mouth daily.         Outstanding Labs/Studies   BMET, CBC  Duration of Discharge Encounter   Greater than 30 minutes including physician  time.  Signed, Cline Crock PA-C 02/15/2017, 2:45 PM

## 2017-02-15 NOTE — Progress Notes (Signed)
  Echocardiogram 2D Echocardiogram has been performed.  Celene SkeenVijay  Aleiyah Halpin 02/15/2017, 2:06 PM

## 2017-02-17 ENCOUNTER — Telehealth: Payer: Self-pay | Admitting: Physician Assistant

## 2017-02-17 NOTE — Telephone Encounter (Signed)
  [facility]   MULTIDISCIPLINARY HEART VALVE TEAM   Attempted TOC phone call. No answer, left message.   Cline CrockKathryn Bernie Ransford PA-C  [facility]

## 2017-02-18 ENCOUNTER — Telehealth: Payer: Self-pay | Admitting: Physician Assistant

## 2017-02-18 NOTE — Telephone Encounter (Signed)
  [facility]   MULTIDISCIPLINARY HEART VALVE TEAM   Patient [facility] on [facility].  Patient understands to follow up with provider Carlean JewsKatie Trestan Vahle on 2/20 @ 3pm [address]  Patient understands discharge instructions? yes Patient understands medications and regiment? yes Patient understands to bring all medications to this visit? yes  Cline CrockKathryn Tripp Goins PA-C  [facility]

## 2017-02-18 NOTE — Telephone Encounter (Signed)
The pt's daughter [name] called to report the pt's hemoglobin level from today's check, 9.1. This remains stable from discharge.  Carlean JewsKatie Thompson PA-C aware and no changes recommended.

## 2017-02-20 ENCOUNTER — Encounter: Payer: Self-pay | Admitting: Physician Assistant

## 2017-02-20 ENCOUNTER — Ambulatory Visit (INDEPENDENT_AMBULATORY_CARE_PROVIDER_SITE_OTHER): Payer: Medicare Other | Admitting: Physician Assistant

## 2017-02-20 VITALS — BP 110/44 | HR 83 | Ht 61.0 in | Wt 109.8 lb

## 2017-02-20 DIAGNOSIS — I3139 Other pericardial effusion (noninflammatory): Secondary | ICD-10-CM

## 2017-02-20 DIAGNOSIS — N179 Acute kidney failure, unspecified: Secondary | ICD-10-CM | POA: Diagnosis not present

## 2017-02-20 DIAGNOSIS — Z8719 Personal history of other diseases of the digestive system: Secondary | ICD-10-CM

## 2017-02-20 DIAGNOSIS — I313 Pericardial effusion (noninflammatory): Secondary | ICD-10-CM

## 2017-02-20 DIAGNOSIS — Z952 Presence of prosthetic heart valve: Secondary | ICD-10-CM | POA: Diagnosis not present

## 2017-02-20 DIAGNOSIS — R0602 Shortness of breath: Secondary | ICD-10-CM

## 2017-02-20 NOTE — Patient Instructions (Signed)
Medication Instructions:  Your provider recommends that you continue on your current medications as directed. Please refer to the Current Medication list given to you today.    Labwork: TODAY: CBC, BMET, BNP, TSH  Testing/Procedures: No new orders  Follow-Up: Please keep all your appointments!  Any Other Special Instructions Will Be Listed Below (If Applicable).     If you need a refill on your cardiac medications before your next appointment, please call your pharmacy.

## 2017-02-20 NOTE — Progress Notes (Signed)
[facility]   MULTIDISCIPLINARY HEART VALVE CLINIC                                       Cardiology Office Note    Date:  02/20/2017   ID:  MONE COMMISSO, DOB 1930-05-10, MRN [id number]  PCP:  Marguarite Arbour, MD  Cardiologist: Dr. Kirke Corin / Dr. Clifton James & Dr. Laneta Simmers (TAVR)  CC: [facility] s/p TAVR  History of Present Illness:  Kelsey Le is a 84 y.o. 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   She has been followed by Dr. Kirke Corin for aortic stenosis with periodic echocardiograms. Her echocardiogram on 08/12/2015 showed moderate aortic stenosis with a mean gradient of 28 mmHg and normal left ventricular systolic function. A follow-up echocardiogram on 08/28/2016 showed progression to severe aortic stenosis with a mean gradient of 65 mm peak gradient of 106 mmHg. She was seen in the emergency department at [facility] in September 2018 with headache and dizziness. A CTA of the head showed a small (2 mm) intracranial aneurysm and mild carotid disease. She was seen by neurosurgery and no further workup was recommended. She also was noted to have a 10 mm nodular lesion in the vallecula which was evaluated by ENT and no further workup was recommended. She was having progressive shortness of breath and fatigue with generalized weakness at this time and was referred to Dr. Clifton James for consideration of TAVR. She underwent cardiac catheterization on 10/05/2016 which showed no evidence of coronary artery disease. There was severe aortic stenosis with a mean gradient of 42 mmHg, a peak to peak gradient of 52 mmHg, and a calculated aortic valve area of 0.69 cm. She was started on aspirin 81 mg daily after the catheterization with her history of AVMs and bleeding to see if she would tolerate that. Unfortunately she was admitted to [facility] on  10/12/1998 1:18 week of aspirin therapy with weakness and melena with a drop in her hemoglobin from 11.9-6.1. The aspirin was stopped. She underwent EGD on 10/12/2016 showing gastritis only. Gastroenterology thought that her bleeding was most likely from her AVMs. She was transfused 3 units of PRBCs and her hemoglobin increased to 10 prior to discharge. Her melena resolved off of aspirin but she was readmitted on 10/26/2016 with recurrent GI bleeding with a hemoglobin of 7.0. She was transfused and had upper endoscopy and was subsequently treated for H. pylori. Her bleeding was again felt to be due to AVMs. Then she was readmitted to [facility] on 12/21/2016 with recurrent anemia and GI bleeding with a hemoglobin of 6.9. She was transfused and discharged home. She had had a capsule study in May 2018 that reportedly showed obvious AVMs. Since her admission in December 2018 she has not had any further obvious GI bleeding and no melena. She has a son and daughter who keep a close eye on her and are very devoted. She uses a walker to help her with balance since she has had multiple falls in the past. She has had both hips replaced due to falls. She has had fractures of both wrist due to falls. She has had vertebral compression fractures related to activity.  Her Hg remained stable and she was evaluated by the multidisciplinary  valve team who felt she was a suitable candidate for TAVR, which was set up for 02/12/17. Given history of extensive GI bleeding, plan was to avoid post TAVR antiplatelet therapy. This was discussed with the patient and family at length and they understood and accepted risks of valve replacement with no DAPT afterwards.   She underwent successful TAVR with a 23 mm Edwards Sapien THV via TF approach on 02/12/17. Post valve deployment, she developed a small pericardial effusion, which remained stable on serial echos. POD1 echo showed normally functioning TAVR valve with no PVL (gradients not listed) and  small pericardial effusion with no tampondae. ECG showed a new LBBB but no high grade block. She was not put on ASA or plavix given history of GI bleeds. She had pleuritic chest pain post operatively, which was felt to be related to pericardial inflammation.She improved on PRN ibuprofen and colchcine 0.6mg  daily. She also had an episode of hypotension that was treated with pressors and AKI with creat up to 1.23. This improved to 1.08 at discharge.   Today she presents to clinic for follow up. She had some black stools and Hg was checked on Monday 2/19 and Hg stable in low 9s. She is still having black stools. No longer having chest pain. Still having a lot of shortness of breath, weakness and fatigue. She doesn't feel any better than before her surgery. No orthopnea or PND.   Past Medical History:  Diagnosis Date  . Anxiety   . Asthma   . AVM (arteriovenous malformation)    a. small intestine with recurrent GI bleeding.   . Chronic diastolic CHF (congestive heart failure) (HCC)   . Hip fracture (HCC)    a. in the setting of mechanical fall  . Iron deficiency anemia    a. requiring periodic transfusions  . S/P TAVR (transcatheter aortic valve replacement)    a. 02/12/17: s/p TAVR w/ an Edwards Sapien 3 THV (size 23 mm, model # 9600TFX, serial # [id number])  . Severe aortic stenosis   . Syncope and collapse     Past Surgical History:  Procedure Laterality Date  . APPENDECTOMY    . BACK SURGERY     back fusion  . CARDIAC CATHETERIZATION    . ESOPHAGOGASTRODUODENOSCOPY (EGD) WITH PROPOFOL N/A 11/15/2015   Procedure: ESOPHAGOGASTRODUODENOSCOPY (EGD) WITH PROPOFOL;  Surgeon: [name], MD;  Location: [facility] ENDOSCOPY;  Service: Endoscopy;  Laterality: N/A;  . ESOPHAGOGASTRODUODENOSCOPY (EGD) WITH PROPOFOL N/A 10/12/2016   Procedure: ESOPHAGOGASTRODUODENOSCOPY (EGD) WITH PROPOFOL;  Surgeon: [name], MD;  Location: [facility] ENDOSCOPY;  Service: Endoscopy;  Laterality: N/A;  . FEMUR SURGERY      broken with rod  . FRACTURE SURGERY    . GIVENS CAPSULE STUDY N/A 02/20/2016   Procedure: GIVENS CAPSULE STUDY;  Surgeon: Scot Jun, MD;  Location: [facility] ENDOSCOPY;  Service: Endoscopy;  Laterality: N/A;  . HIP FRACTURE SURGERY    . INTRAMEDULLARY (IM) NAIL INTERTROCHANTERIC Right 11/18/2015   Procedure: INTRAMEDULLARY (IM) NAIL INTERTROCHANTRIC;  Surgeon: Juanell Fairly, MD;  Location: [facility] ORS;  Service: Orthopedics;  Laterality: Right;  . KYPHOPLASTY N/A 08/19/2014   Procedure: KYPHOPLASTY;  Surgeon: Kennedy Bucker, MD;  Location: [facility] ORS;  Service: Orthopedics;  Laterality: N/A;  . PARTIAL HYSTERECTOMY    . RIGHT/LEFT HEART CATH AND CORONARY ANGIOGRAPHY N/A 10/05/2016   Procedure: RIGHT/LEFT HEART CATH AND CORONARY ANGIOGRAPHY;  Surgeon: Kathleene Hazel, MD;  Location: [facility] INVASIVE CV LAB;  Service: Cardiovascular;  Laterality: N/A;  . TEE WITHOUT  CARDIOVERSION N/A 02/12/2017   Procedure: TRANSESOPHAGEAL ECHOCARDIOGRAM (TEE);  Surgeon: Kathleene Hazel, MD;  Location: [facility] OR;  Service: Open Heart Surgery;  Laterality: N/A;  . TRANSCATHETER AORTIC VALVE REPLACEMENT, TRANSFEMORAL N/A 02/12/2017   Procedure: TRANSCATHETER AORTIC VALVE REPLACEMENT, TRANSFEMORAL using a 23mm Edwards Sapien 3 Aortic Valve;  Surgeon: Kathleene Hazel, MD;  Location: [facility] OR;  Service: Open Heart Surgery;  Laterality: N/A;  . TUBAL LIGATION      Current Medications: Outpatient Medications Prior to Visit  Medication Sig Dispense Refill  . acetaminophen (TYLENOL) 325 MG tablet Take 325 mg by mouth every 6 (six) hours as needed for moderate pain or headache.     . Cholecalciferol (VITAMIN D3 PO) Take 1 capsule by mouth daily.    Marland Kitchen Fexofenadine HCl (ALLEGRA ALLERGY CHILDRENS PO) Take 1 tablet by mouth daily.    . vitamin B-12 (CYANOCOBALAMIN) 1000 MCG tablet Take 1,000 mcg by mouth daily.    . colchicine 0.6 MG tablet Take 1 tablet (0.6 mg total) by mouth daily. (Patient not taking: Reported on  02/20/2017) 7 tablet 0   No facility-administered medications prior to visit.      Allergies:   Aspirin and Penicillins   Social History   Socioeconomic History  . Marital status: Divorced    Spouse name: None  . Number of children: None  . Years of education: None  . Highest education level: None  Social Needs  . Financial resource strain: None  . Food insecurity - worry: None  . Food insecurity - inability: None  . Transportation needs - medical: None  . Transportation needs - non-medical: None  Occupational History  . None  Tobacco Use  . Smoking status: Never Smoker  . Smokeless tobacco: Never Used  Substance and Sexual Activity  . Alcohol use: No  . Drug use: No  . Sexual activity: None  Other Topics Concern  . None  Social History Narrative  . None     Family History:  The patient's family history includes Breast cancer in her maternal aunt; Heart block in her mother; Heart disease in her sister; Heart failure in her father.      ROS:   Please see the history of present illness.    ROS All other systems reviewed and are negative.   PHYSICAL EXAM:   VS:  BP (!) 110/44   Pulse 83   Ht 5\' 1"  (1.549 m)   Wt 109 lb 12.8 oz (49.8 kg)   SpO2 95%   BMI 20.75 kg/m    GEN: thin, elderly white female  HEENT: normal  Neck: mildly elevated JVD, No carotid bruits, or masses Cardiac: RRR; soft flow murmur. No rubs, or gallops, 1+ bilateral LE edema  Respiratory:  clear to auscultation bilaterally, normal work of breathing GI: soft, nontender, nondistended, + BS MS: no deformity or atrophy  Skin: warm and dry, no rash Neuro:  Alert and Oriented x 3, Strength and sensation are intact Psych: euthymic mood, full affect   Wt Readings from Last 3 Encounters:  02/20/17 109 lb 12.8 oz (49.8 kg)  02/15/17 111 lb 5.3 oz (50.5 kg)  02/06/17 110 lb (49.9 kg)      Studies/Labs Reviewed:   EKG:  EKG is ordered today.  The ekg ordered today demonstrates NSR, LBBB HR  82  Recent Labs: 02/06/2017: ALT 21; B Natriuretic Peptide 351.7 02/13/2017: Magnesium 1.8 02/15/2017: BUN 23; Creatinine, Ser 1.08; Hemoglobin 9.4; Platelets 121; Potassium 3.5; Sodium 141  Lipid Panel No results found for: CHOL, TRIG, HDL, CHOLHDL, VLDL, LDLCALC, LDLDIRECT  Additional studies/ records that were reviewed today include:  TAVR OPERATIVE NOTE   Date of Procedure:02/12/2017  Preoperative Diagnosis:Severe Aortic Stenosis   Postoperative Diagnosis:Same   Procedure:   Transcatheter Aortic Valve Replacement - PercutaneousRightTransfemoral Approach Edwards Sapien 3 THV (size 23mm, model # 9600TFX, serial # [id number])  Co-Surgeons:Bryan Jennefer Bravo, MD and Verne Carrow, MD  Pre-operative Echo Findings: ? Severe aortic stenosis ? Normalleft ventricular systolic function  Post-operative Echo Findings: ? NOparavalvular leak ? Normalleft ventricular systolic function ? New pericardial effusion 1cm unchanged after continued observation in OR  _____________________   Post op limited echo 02/12/17 Study Conclusions - Left ventricle: The cavity size was normal. There was moderate concentric hypertrophy. Systolic function was vigorous. The estimated ejection fraction was in the range of 65% to 70%. Wall motion was normal; there were no regional wall motion abnormalities. Doppler parameters are consistent with abnormal left ventricular relaxation (grade 1 diastolic dysfunction). - Aortic valve: A Sapien 23mm TAVR bioprosthesis was present. The prosthesis had a normal function. - Mitral valve: Calcified annulus. - Pericardium, extracardiac: A small pericardial effusion was identified circumferential to the heart. There was no evidence of hemodynamic compromise. Impressions: - Effusion is similar when compared to prior.  _____________________  POD #1 echo  02/13/17 Study Conclusion: - Left ventricle: The cavity size was normal. Wall thickness was increased in a pattern of severe LVH. Systolic function was vigorous. The estimated ejection fraction was in the range of 65% to 70%. Doppler parameters are consistent with abnormal left ventricular relaxation (grade 1 diastolic dysfunction). - Aortic valve: A bioprosthesis was present. - Left atrium: The atrium was moderately dilated. - Pulmonary arteries: Systolic pressure was moderately increased. PA peak pressure: 51 mm Hg (S). - Pericardium, extracardiac: A small pericardial effusion was identified. Features were not consistent with tamponade physiology.   _____________________   Limited echo 02/15/17 Study Conclusions - Left ventricle: The cavity size was normal. Wall thickness was   increased in a pattern of moderate LVH. Systolic function was   normal. The estimated ejection fraction was in the range of 60%   to 65%. - Aortic valve: post TAVR with no peri valvular regurgitation and   stable gradients. - Mitral valve: Calcified annulus. There was mild regurgitation. - Pericardium, extracardiac: Limited echo there is a small   posterior and lateral pericardial effusion with no obvious   tamponade similar to size echo 2/12 and 2/13   There is also a 1 cm calcified nodular structure in the RA that   was not well seen in OR due to limited views. May represent a   calcified coumadin ridge   ASSESSMENT & PLAN:   Severe AS s/p TAVR: doing just okay. Groin sites are stable. ECG with NSR, HR 82 with LBBB. She is still having SOB and fatigue. She has mildly elevated neck veins and minor LE edema. I am going to check BMET, BNP, CBC, TSH. If blood counts are stable, I will trial a short dose of lasix 20mg  x 5 days to see if this helps her dyspnea.   Pericardial effusion: this was stable by serial echocardiograms during admission. Will recheck at 1 month appointment on 3/13  with Dr. Clifton James.  AKI: creat rose during admission but trended down 0.84--> 1.23--> 1.08 . Will check BMET to ensure back to baseline.   History of recurrent GI bleeds: Hg stable in low 9s at primary care office  Monday. Will recheck CBC today. Continue iron    Medication Adjustments/Labs and Tests Ordered: Current medicines are reviewed at length with the patient today.  Concerns regarding medicines are outlined above.  Medication changes, Labs and Tests ordered today are listed in the Patient Instructions below. Patient Instructions  Medication Instructions:  Your provider recommends that you continue on your current medications as directed. Please refer to the Current Medication list given to you today.    Labwork: TODAY: CBC, BMET, BNP, TSH  Testing/Procedures: No new orders  Follow-Up: Please keep all your appointments!  Any Other Special Instructions Will Be Listed Below (If Applicable).     If you need a refill on your cardiac medications before your next appointment, please call your pharmacy.      Signed, Cline CrockKathryn Shaelyn Decarli, PA-C  02/20/2017 3:48 PM    [facility] [address], [place], [place]  [address] Phone: [phone]; Fax: [phone]

## 2017-02-21 ENCOUNTER — Ambulatory Visit (INDEPENDENT_AMBULATORY_CARE_PROVIDER_SITE_OTHER): Payer: Medicare Other | Admitting: Gastroenterology

## 2017-02-21 ENCOUNTER — Encounter: Payer: Self-pay | Admitting: Gastroenterology

## 2017-02-21 ENCOUNTER — Other Ambulatory Visit: Payer: Self-pay | Admitting: Physician Assistant

## 2017-02-21 VITALS — BP 145/54 | HR 90 | Ht 61.0 in | Wt 110.8 lb

## 2017-02-21 DIAGNOSIS — D500 Iron deficiency anemia secondary to blood loss (chronic): Secondary | ICD-10-CM

## 2017-02-21 LAB — BASIC METABOLIC PANEL
BUN / CREAT RATIO: 36 — AB (ref 12–28)
BUN: 30 mg/dL — ABNORMAL HIGH (ref 8–27)
CALCIUM: 9 mg/dL (ref 8.7–10.3)
CO2: 21 mmol/L (ref 20–29)
Chloride: 109 mmol/L — ABNORMAL HIGH (ref 96–106)
Creatinine, Ser: 0.83 mg/dL (ref 0.57–1.00)
GFR, EST AFRICAN AMERICAN: 75 mL/min/{1.73_m2} (ref >59)
GFR, EST NON AFRICAN AMERICAN: 65 mL/min/{1.73_m2} (ref >59)
Glucose: 89 mg/dL (ref 65–99)
POTASSIUM: 4 mmol/L (ref 3.5–5.2)
Sodium: 147 mmol/L — ABNORMAL HIGH (ref 134–144)

## 2017-02-21 LAB — CBC WITH DIFFERENTIAL/PLATELET
BASOS: 1 %
Basophils Absolute: 0.1 10*3/uL (ref 0.0–0.2)
EOS (ABSOLUTE): 0.2 10*3/uL (ref 0.0–0.4)
EOS: 3 %
HEMATOCRIT: 26 % — AB (ref 34.0–46.6)
Hemoglobin: 8.4 g/dL — ABNORMAL LOW (ref 11.1–15.9)
IMMATURE GRANULOCYTES: 0 %
Immature Grans (Abs): 0 10*3/uL (ref 0.0–0.1)
LYMPHS ABS: 1 10*3/uL (ref 0.7–3.1)
Lymphs: 22 %
MCH: 30.4 pg (ref 26.6–33.0)
MCHC: 32.3 g/dL (ref 31.5–35.7)
MCV: 94 fL (ref 79–97)
MONOS ABS: 0.5 10*3/uL (ref 0.1–0.9)
Monocytes: 10 %
NEUTROS ABS: 3 10*3/uL (ref 1.4–7.0)
Neutrophils: 64 %
Platelets: 225 10*3/uL (ref 150–379)
RBC: 2.76 x10E6/uL — ABNORMAL LOW (ref 3.77–5.28)
RDW: 15.8 % — AB (ref 12.3–15.4)
WBC: 4.7 10*3/uL (ref 3.4–10.8)

## 2017-02-21 LAB — TSH: TSH: 9.76 u[IU]/mL — ABNORMAL HIGH (ref 0.450–4.500)

## 2017-02-21 LAB — PRO B NATRIURETIC PEPTIDE: NT-Pro BNP: 1703 pg/mL — ABNORMAL HIGH (ref 0–738)

## 2017-02-21 MED ORDER — OMEPRAZOLE 20 MG PO CPDR: 20.0000 mg | DELAYED_RELEASE_CAPSULE | Freq: Every day | ORAL | 3 refills | Status: DC

## 2017-02-21 MED ORDER — FUROSEMIDE 20 MG PO TABS: 20.0000 mg | ORAL_TABLET | Freq: Every day | ORAL | 0 refills | Status: DC

## 2017-02-21 NOTE — Progress Notes (Signed)
Kelsey Mood MD, MRCP(U.K) [address]  [address]  [place], [place] [address]  Main: [phone]  Fax: [phone]   Primary Care Physician: Kelsey Arbour, MD  Primary Gastroenterologist:  Dr. Wyline Le   No chief complaint on file.   HPI: Kelsey Le is a 84 y.o. female   Summary of history :  She is here today to see me for a hospital follow up. I was consulted to see her when she was admitted in 12/2016 . has a history of iron deficiency anemia attributed to chronic GI blood loss.  She does have severe aortic stenosis.  She has had on and off melena for a long time.  She is followed by hematology in terms of her anemia.  She sees Dr. Mechele Le as an outpatient.  Last office visit was in February 2018.  She has had multiple upper endoscopies over the years.  Colonoscopy last performed in 09/20/2013 at [facility] for iron deficiency anemia was normal.  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  She recently underwent an upper endoscopy on 10/12/2016 by Dr. Charolotte Le and she presented with melena and he noted diffuse mild inflammation found in the stomach examined portion of the duodenum was normal.  She underwent a capsule study in May 2018 with Dr. Mechele Le which showed AVMs in the small intestine time the proximal portion of small intestine approximately 51 minutes into the procedure.  Multiple was seen for the next 20-30 minutes it is not clear subsequently what the subsequent plan was in terms of management of these AVMs I do not see any further office notes with [facility] to indicate a clear plan of action with regards to management of the AVMs.  In the interim she was treated for H. pylori by Dr. Maryan Le was admitted on 12/21/2016 when she presented to the emergency room with melena of 4 days duration and generalized weakness.  Dr. Judithann Le ordered a CBC which demonstrated a hemoglobin of 6.7 and was sent to   the ER for further evaluation.She has had on and off melena for "years " per patient . 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  The permanent treatment for this condition would be to treat the valve which has been shown that treatment of aortic stenosis leads  to cessation of bleeding from the AVMs.This condition usually occurs from aortic stenosis due to development of an acquired von Willebrand's factor dysfunction which leads to development of bleeds.   Interval history   12//2018-  02/22/2016  She was subsequently discharged .She was seen by her Cardiologist yesterday , she has undergone a TAVR procedure on 02/12/17  for the severe aortic stenosis . Not on any anticoagulation since. Hb a week back was 9.4 grams and yesterday was 8.4 grams with associated imrovement of her serum creatinine from 1.23 to 0.83.   Feels better since TAVR, still has black stools.   Current Outpatient Medications  Medication Sig Dispense Refill  . acetaminophen (TYLENOL) 325 MG tablet Take 325 mg by mouth every 6 (six) hours as needed for moderate pain or headache.     . Cholecalciferol (VITAMIN D3 PO) Take 1 capsule by mouth daily.    Marland Kitchen Fexofenadine HCl (ALLEGRA ALLERGY CHILDRENS PO) Take 1 tablet by mouth daily.    . furosemide (LASIX)  20 MG tablet Take 1 tablet (20 mg total) by mouth daily. 5 tablet 0  . vitamin B-12 (CYANOCOBALAMIN) 1000 MCG tablet Take 1,000 mcg by mouth daily.     No current facility-administered medications for this visit.     Allergies as of 02/21/2017 - Review Complete 02/20/2017  Allergen Reaction Noted  . Aspirin Other (See Comments) 10/15/2016  . Penicillins Rash and Other (See Comments) 04/24/2013    ROS:  General: Negative for anorexia, weight loss, fever, chills, fatigue, weakness. ENT: Negative for hoarseness, difficulty swallowing , nasal congestion. CV:  Negative for chest pain, angina, palpitations, dyspnea on exertion, peripheral edema.  Respiratory: Negative for dyspnea at rest, dyspnea on exertion, cough, sputum, wheezing.  GI: See history of present illness. GU:  Negative for dysuria, hematuria, urinary incontinence, urinary frequency, nocturnal urination.  Endo: Negative for unusual weight change.    Physical Examination:   There were no vitals taken for this visit.  General: Well-nourished, well-developed in no acute distress.  Eyes: No icterus. Conjunctivae pink. Mouth: Oropharyngeal mucosa moist and pink , no lesions erythema or exudate. Lungs: Clear to auscultation bilaterally. Non-labored. Heart: Regular rate and rhythm, systolic murmur in aortic area . No  rubs or gallops.  Abdomen: Bowel sounds are normal, nontender, nondistended, no hepatosplenomegaly or masses, no abdominal bruits or hernia , no rebound or guarding.   Extremities: No lower extremity edema. No clubbing or deformities. Neuro: Alert and oriented x 3.  Grossly intact. Skin: Warm and dry, no jaundice.   Psych: Alert and cooperative, normal Le and affect.   Imaging Studies: Dg Chest 2 View  Result Date: 02/06/2017 CLINICAL DATA:  Severe aortic stenosis.  Preoperative study. EXAM: CHEST  2 VIEW COMPARISON:  Chest x-ray dated November 17, 2015. FINDINGS: Stable mild cardiomegaly. Normal pulmonary vascularity. Atherosclerotic calcification of the aortic arch. No focal consolidation, pleural effusion, or pneumothorax. Prior T10 and L2 vertebroplasties. Unchanged mild compression deformity of L1. IMPRESSION: No active cardiopulmonary disease. Electronically Signed   By: Kelsey Le M.D.   On: 02/06/2017 17:01   Ct Coronary Morph W/cta Cor W/score W/ca W/cm &/or Wo/cm  Addendum Date: 01/29/2017   ADDENDUM REPORT: 01/29/2017 13:58 CLINICAL DATA:  84 year old female with severe aortic stenosis being evaluated for a TAVR procedure. EXAM: Cardiac TAVR CT TECHNIQUE:  The patient was scanned on a [employer]. A 120 kV retrospective scan was triggered in the descending thoracic aorta at 111 HU's. Gantry rotation speed was 250 msecs and collimation was .6 mm. No beta blockade or nitro were given. The 3D data set was reconstructed in 5% intervals of the R-R cycle. Systolic and diastolic phases were analyzed on a dedicated work station using MPR, MIP and VRT modes. The patient received 80 cc of contrast. FINDINGS: Aortic Valve: Severely thickened and calcified aortic valve with severely restricted leaflet opening and minimal calcifications extending into the LVOT under the non-coronary leaflet. Aorta: Normal size, mild diffuse calcifications and no dissection. Sinotubular Junction: 25 x 23 mm Ascending Thoracic Aorta: 28 x 27 mm Aortic Arch: 26 x 24 mm Descending Thoracic Aorta: 22 x 22 mm Sinus of Valsalva Measurements: Non-coronary: 28 mm Right -coronary: 25 mm Left -coronary: 27 mm Sinus of Valsalva Height: Right -coronary: 28 mm Left -coronary: 19 mm Coronary Artery Height above Annulus: Left Main: 10 mm Right Coronary: 11 mm Virtual Basal Annulus Measurements: Maximum/Minimum Diameter: 24.4 x 19.7 mm Mean Diameter: 22.0 mm Perimeter: 71.4 mm Area: 379 mm2 Optimum Fluoroscopic Angle for Delivery:  LAO 19 CAU 17. IMPRESSION: 1. Severely thickened and calcified aortic valve with severely restricted leaflet opening and minimal calcifications extending into the LVOT under the non-coronary leaflet. Annular measurements suitable for delivery of a 23 mm Edwards-SAPIEN 3 valve and a 26 mm Evolut Pro valve. 2. Sufficient coronary to annulus distance. 3. Optimum Fluoroscopic Angle for Delivery:  LAO 19 CAU 17. 4. No thrombus in the left atrial appendage. Electronically Signed   By: Tobias Alexander   On: 01/29/2017 13:58   Result Date: 01/29/2017 EXAM: OVER-READ INTERPRETATION  CT CHEST The following report is an over-read performed by radiologist Dr. Trudie Reed of  [facility] on 01/29/2017. This over-read does not include interpretation of cardiac or coronary anatomy or pathology. The coronary calcium score/coronary CTA interpretation by the cardiologist is attached. COMPARISON:  None. FINDINGS: Extracardiac findings will be described under dictation for contemporaneously obtained CTA chest, abdomen and pelvis. IMPRESSION: Please see separate dictation for contemporaneously obtained CTA chest, abdomen and pelvis for full description of relevant extracardiac findings. Electronically Signed: By: Trudie Reed M.D. On: 01/29/2017 09:53   Dg Chest Port 1 View  Result Date: 02/12/2017 CLINICAL DATA:  Status post aortic valve replacement. Aortic stenosis. EXAM: PORTABLE CHEST 1 VIEW COMPARISON:  February 07, 2007 FINDINGS: There is an aortic valve replacement. Central catheter tip is in the superior vena cava. No pneumothorax. There is slight scarring in the left base. There is no edema or consolidation. Heart size and pulmonary vascularity are normal. No adenopathy. Patient has undergone previous kyphoplasty procedures at T10 and L2, stable. IMPRESSION: Status post aortic valve replacement. Central catheter tip in superior vena cava. No pneumothorax. Scarring left base. No edema or consolidation. Stable cardiac silhouette. There is aortic atherosclerosis. Aortic Atherosclerosis (ICD10-I70.0). Electronically Signed   By: Bretta Bang III M.D.   On: 02/12/2017 14:15   Ct Angio Chest Aorta W &/or Wo Contrast  Result Date: 01/29/2017 CLINICAL DATA:  84 year old female history of severe aortic valve stenosis. Preprocedural study prior to potential transcatheter aortic valve replacement (TAVR). EXAM: CT ANGIOGRAPHY CHEST, ABDOMEN AND PELVIS TECHNIQUE: Multidetector CT imaging through the chest, abdomen and pelvis was performed using the standard protocol during bolus administration of intravenous contrast. Multiplanar reconstructed images and MIPs were  obtained and reviewed to evaluate the vascular anatomy. CONTRAST:  95 mL of Isovue 370. COMPARISON:  CT the abdomen and pelvis 01/29/2013. FINDINGS: CTA CHEST FINDINGS Cardiovascular: Heart size is mildly enlarged with left atrial dilatation. There is no significant pericardial fluid, thickening or pericardial calcification. Aortic atherosclerosis. Severe thickening calcification of the aortic valve. Mediastinum/Lymph Nodes: No pathologically enlarged mediastinal or hilar lymph nodes. Esophagus is unremarkable in appearance. No axillary lymphadenopathy. Lungs/Pleura: Small calcified granuloma in the right upper lobe in an area of focal architectural distortion which likely reflects some mild post infectious or inflammatory scarring. No other suspicious appearing pulmonary nodules or masses. No acute consolidative airspace disease. No pleural effusions. Musculoskeletal/Soft Tissues: Chronic compression fracture of T10 with post vertebroplasty changes and approximately 40% loss of anterior vertebral body height. There are no aggressive appearing lytic or blastic lesions noted in the visualized portions of the skeleton. CTA ABDOMEN AND PELVIS FINDINGS Hepatobiliary: No suspicious cystic or solid hepatic lesions. No intra or extrahepatic biliary ductal dilatation. Gallbladder is nearly completely decompressed, but otherwise unremarkable in appearance. Pancreas: No pancreatic mass. No pancreatic ductal dilatation. No pancreatic or peripancreatic fluid or inflammatory changes. Spleen: Unremarkable. Adrenals/Urinary Tract: Bilateral kidneys and left adrenal gland are normal  in appearance. 11 x 8 mm right adrenal nodule, similar to prior study, at which point this was low-attenuation, most compatible with an adenoma. No hydroureteronephrosis. Urinary bladder is normal in appearance. Stomach/Bowel: Normal appearance of the stomach. No pathologic dilatation of small bowel or colon. Numerous colonic diverticulae are noted,  particularly in the sigmoid colon, without surrounding inflammatory changes to suggest an acute diverticulitis at this time. The appendix is not confidently identified and may be surgically absent. Regardless, there are no inflammatory changes noted adjacent to the cecum to suggest the presence of an acute appendicitis at this time. Vascular/Lymphatic: Aortic atherosclerosis, without evidence of aneurysm or dissection in the abdominal or pelvic vasculature. Vascular findings and measurements pertinent to potential TAVR procedure, as detailed below. Celiac axis, superior mesenteric artery, inferior mesenteric artery and single renal arteries bilaterally are all widely patent without hemodynamically significant stenosis. No lymphadenopathy noted in the abdomen or pelvis. Reproductive: Prostate gland and seminal vesicles are unremarkable in appearance. Other: No significant volume of ascites.  No pneumoperitoneum. Musculoskeletal: Chronic compression fractures of L1 and L2 with approximately 30% loss of height at both levels and post vertebroplasty changes at L2. Orthopedic fixation hardware in the proximal femurs bilaterally. There are no aggressive appearing lytic or blastic lesions noted in the visualized portions of the skeleton. VASCULAR MEASUREMENTS PERTINENT TO TAVR: AORTA: Minimal Aortic Diameter-11 x 11 mm Severity of Aortic Calcification-severe RIGHT PELVIS: Right Common Iliac Artery - Minimal Diameter-8.8 x 8.0 mm Tortuosity-mild Calcification-moderate Right External Iliac Artery - Minimal Diameter-6.5 x 6.3 mm Tortuosity-moderate Calcification-mild Right Common Femoral Artery - Minimal Diameter-7.4 x 6.8 mm Tortuosity-mild Calcification-none LEFT PELVIS: Left Common Iliac Artery - Minimal Diameter-8.9 x 8.1 mm Tortuosity-mild Calcification-moderate Left External Iliac Artery - Minimal Diameter-6.7 x 6.5 mm Tortuosity-moderate Calcification-none Left Common Femoral Artery - Minimal Diameter-7.0 x 7.1 mm  Tortuosity-mild Calcification-none Review of the MIP images confirms the above findings. IMPRESSION: 1. Vascular findings and measurements pertinent to potential TAVR procedure, as detailed above. 2. Severe thickening and calcification of the aortic valve, compatible with the reported clinical history of severe aortic stenosis. 3. Mild cardiomegaly with mild left atrial dilatation. 4. Additional incidental findings, as above. Aortic Atherosclerosis (ICD10-I70.0). Electronically Signed   By: Trudie Reedaniel  Entrikin M.D.   On: 01/29/2017 11:49   Ct Angio Abd/pel W/ And/or W/o  Result Date: 01/29/2017 CLINICAL DATA:  84 year old female history of severe aortic valve stenosis. Preprocedural study prior to potential transcatheter aortic valve replacement (TAVR). EXAM: CT ANGIOGRAPHY CHEST, ABDOMEN AND PELVIS TECHNIQUE: Multidetector CT imaging through the chest, abdomen and pelvis was performed using the standard protocol during bolus administration of intravenous contrast. Multiplanar reconstructed images and MIPs were obtained and reviewed to evaluate the vascular anatomy. CONTRAST:  95 mL of Isovue 370. COMPARISON:  CT the abdomen and pelvis 01/29/2013. FINDINGS: CTA CHEST FINDINGS Cardiovascular: Heart size is mildly enlarged with left atrial dilatation. There is no significant pericardial fluid, thickening or pericardial calcification. Aortic atherosclerosis. Severe thickening calcification of the aortic valve. Mediastinum/Lymph Nodes: No pathologically enlarged mediastinal or hilar lymph nodes. Esophagus is unremarkable in appearance. No axillary lymphadenopathy. Lungs/Pleura: Small calcified granuloma in the right upper lobe in an area of focal architectural distortion which likely reflects some mild post infectious or inflammatory scarring. No other suspicious appearing pulmonary nodules or masses. No acute consolidative airspace disease. No pleural effusions. Musculoskeletal/Soft Tissues: Chronic compression  fracture of T10 with post vertebroplasty changes and approximately 40% loss of anterior vertebral body height. There are no aggressive  appearing lytic or blastic lesions noted in the visualized portions of the skeleton. CTA ABDOMEN AND PELVIS FINDINGS Hepatobiliary: No suspicious cystic or solid hepatic lesions. No intra or extrahepatic biliary ductal dilatation. Gallbladder is nearly completely decompressed, but otherwise unremarkable in appearance. Pancreas: No pancreatic mass. No pancreatic ductal dilatation. No pancreatic or peripancreatic fluid or inflammatory changes. Spleen: Unremarkable. Adrenals/Urinary Tract: Bilateral kidneys and left adrenal gland are normal in appearance. 11 x 8 mm right adrenal nodule, similar to prior study, at which point this was low-attenuation, most compatible with an adenoma. No hydroureteronephrosis. Urinary bladder is normal in appearance. Stomach/Bowel: Normal appearance of the stomach. No pathologic dilatation of small bowel or colon. Numerous colonic diverticulae are noted, particularly in the sigmoid colon, without surrounding inflammatory changes to suggest an acute diverticulitis at this time. The appendix is not confidently identified and may be surgically absent. Regardless, there are no inflammatory changes noted adjacent to the cecum to suggest the presence of an acute appendicitis at this time. Vascular/Lymphatic: Aortic atherosclerosis, without evidence of aneurysm or dissection in the abdominal or pelvic vasculature. Vascular findings and measurements pertinent to potential TAVR procedure, as detailed below. Celiac axis, superior mesenteric artery, inferior mesenteric artery and single renal arteries bilaterally are all widely patent without hemodynamically significant stenosis. No lymphadenopathy noted in the abdomen or pelvis. Reproductive: Prostate gland and seminal vesicles are unremarkable in appearance. Other: No significant volume of ascites.  No  pneumoperitoneum. Musculoskeletal: Chronic compression fractures of L1 and L2 with approximately 30% loss of height at both levels and post vertebroplasty changes at L2. Orthopedic fixation hardware in the proximal femurs bilaterally. There are no aggressive appearing lytic or blastic lesions noted in the visualized portions of the skeleton. VASCULAR MEASUREMENTS PERTINENT TO TAVR: AORTA: Minimal Aortic Diameter-11 x 11 mm Severity of Aortic Calcification-severe RIGHT PELVIS: Right Common Iliac Artery - Minimal Diameter-8.8 x 8.0 mm Tortuosity-mild Calcification-moderate Right External Iliac Artery - Minimal Diameter-6.5 x 6.3 mm Tortuosity-moderate Calcification-mild Right Common Femoral Artery - Minimal Diameter-7.4 x 6.8 mm Tortuosity-mild Calcification-none LEFT PELVIS: Left Common Iliac Artery - Minimal Diameter-8.9 x 8.1 mm Tortuosity-mild Calcification-moderate Left External Iliac Artery - Minimal Diameter-6.7 x 6.5 mm Tortuosity-moderate Calcification-none Left Common Femoral Artery - Minimal Diameter-7.0 x 7.1 mm Tortuosity-mild Calcification-none Review of the MIP images confirms the above findings. IMPRESSION: 1. Vascular findings and measurements pertinent to potential TAVR procedure, as detailed above. 2. Severe thickening and calcification of the aortic valve, compatible with the reported clinical history of severe aortic stenosis. 3. Mild cardiomegaly with mild left atrial dilatation. 4. Additional incidental findings, as above. Aortic Atherosclerosis (ICD10-I70.0). Electronically Signed   By: Trudie Reed M.D.   On: 01/29/2017 11:49    Assessment and Plan:   FELEICA FULMORE is a 84 y.o. y/o female S/p TAVR on 02/12/17 for severe aortic stenosis, long standing iron deficiency anemia , known to have had small bowel AVm's in the past , not undergone any prior GI therapy such as ablation for unclear reasons. My impression is that the AVM's are secondary to the Aortic stenosis, since she has had  the TAVR there is a good possibility that the bleeding from the AVM's can resolve. I do note that she has had a drop in her Hb from 9.4 grams t0 8.4 grams, I also note that her creatinine has also improved from 1.23 to 0.83. Possible drop in Hb is from dilution. She does have black stools which can be the bleed that is resolving. In either  way I suggest   1. Check iron studies and if low needs IV iron  2. She will need to be seen for [facility] to discuss options of deep enteroscopy, in the meanwhile if the Hb remains stable then can always cancel the procedure , but it is possible better to start the process at this time and in the meanwhile optimize her cardiac function that she can tolerate anesthesia better.  3. No NSAID's 4. Prilosec 20 mg a day   Dr Kelsey Mood  MD,MRCP [facility]) Follow up in 2 weeks

## 2017-02-22 ENCOUNTER — Telehealth: Payer: Self-pay

## 2017-02-22 ENCOUNTER — Telehealth: Payer: Self-pay | Admitting: Gastroenterology

## 2017-02-22 DIAGNOSIS — I350 Nonrheumatic aortic (valve) stenosis: Secondary | ICD-10-CM

## 2017-02-22 DIAGNOSIS — I5033 Acute on chronic diastolic (congestive) heart failure: Secondary | ICD-10-CM

## 2017-02-22 DIAGNOSIS — D500 Iron deficiency anemia secondary to blood loss (chronic): Secondary | ICD-10-CM

## 2017-02-22 DIAGNOSIS — M5416 Radiculopathy, lumbar region: Secondary | ICD-10-CM

## 2017-02-22 LAB — IRON,TIBC AND FERRITIN PANEL
FERRITIN: 209 ng/mL — AB (ref 15–150)
IRON SATURATION: 14 % — AB (ref 15–55)
Iron: 39 ug/dL (ref 27–139)
Total Iron Binding Capacity: 282 ug/dL (ref 250–450)
UIBC: 243 ug/dL (ref 118–369)

## 2017-02-22 NOTE — Telephone Encounter (Signed)
-----   Message from Janetta HoraKathryn R Thompson, PA-C sent at 02/21/2017  8:33 AM EST ----- A few things going on here: 1. Her hg has dropped some: 9.4--> 8.4. Its still in an acceptable range but with ongoing black stools, she needs to get back into see GI. 2. Her BNP is elevated c/w volume overload. Fortunately, her kidney function and electrolytes look good. I am going to call Lasix 20mg  daily into her pharmacy x 5 days. I want a repeat BMET on Monday, which we will get set up in [place]. Since she is getting labs, I would like another CBC as well.  3. Her thyroid function is borderline elevated. We will check a Free T4 with labs next week as well. She may need thyroid supplementation, which can be managed by her primary care Dr. This may be contributing to her fatigue.   Orpha BurKaty, I have discussed the above with her Son, Mr Nelda SevereWaller. I have called in lasix to [facility]. Can you help me get them a lab appointment in [place] with CBC, Free T4 and BMET? Thank you!!

## 2017-02-22 NOTE — Telephone Encounter (Signed)
Spoke with patient's daughter. They will go on Monday to [facility] in [place] for labs. Orders for CBC, BMET, T4 ordered and released for [facility]. She was grateful for call and agrees with treatment plan.

## 2017-02-22 NOTE — Telephone Encounter (Signed)
Daughter [name] calling for lab results.

## 2017-02-26 ENCOUNTER — Telehealth: Payer: Self-pay

## 2017-02-26 LAB — BASIC METABOLIC PANEL
BUN / CREAT RATIO: 32 — AB (ref 12–28)
BUN: 28 mg/dL — ABNORMAL HIGH (ref 8–27)
CALCIUM: 8.9 mg/dL (ref 8.7–10.3)
CHLORIDE: 105 mmol/L (ref 96–106)
CO2: 24 mmol/L (ref 20–29)
Creatinine, Ser: 0.88 mg/dL (ref 0.57–1.00)
GFR calc non Af Amer: 61 mL/min/{1.73_m2} (ref >59)
GFR, EST AFRICAN AMERICAN: 70 mL/min/{1.73_m2} (ref >59)
GLUCOSE: 83 mg/dL (ref 65–99)
POTASSIUM: 4.2 mmol/L (ref 3.5–5.2)
Sodium: 144 mmol/L (ref 134–144)

## 2017-02-26 LAB — CBC WITH DIFFERENTIAL/PLATELET
BASOS ABS: 0.1 10*3/uL (ref 0.0–0.2)
Basos: 1 %
EOS (ABSOLUTE): 0.1 10*3/uL (ref 0.0–0.4)
Eos: 1 %
Hematocrit: 25.7 % — ABNORMAL LOW (ref 34.0–46.6)
Hemoglobin: 8.1 g/dL — ABNORMAL LOW (ref 11.1–15.9)
Immature Grans (Abs): 0 10*3/uL (ref 0.0–0.1)
Immature Granulocytes: 0 %
LYMPHS ABS: 1.3 10*3/uL (ref 0.7–3.1)
LYMPHS: 21 %
MCH: 30.2 pg (ref 26.6–33.0)
MCHC: 31.5 g/dL (ref 31.5–35.7)
MCV: 96 fL (ref 79–97)
Monocytes Absolute: 0.4 10*3/uL (ref 0.1–0.9)
Monocytes: 6 %
Neutrophils Absolute: 4.2 10*3/uL (ref 1.4–7.0)
Neutrophils: 71 %
Platelets: 294 10*3/uL (ref 150–379)
RBC: 2.68 x10E6/uL — CL (ref 3.77–5.28)
RDW: 15.8 % — AB (ref 12.3–15.4)
WBC: 6 10*3/uL (ref 3.4–10.8)

## 2017-02-26 LAB — T4, FREE: FREE T4: 1.13 ng/dL (ref 0.82–1.77)

## 2017-02-26 NOTE — Telephone Encounter (Signed)
Agree thanks. chris 

## 2017-02-26 NOTE — Telephone Encounter (Signed)
The pt's son contacted me in regards to results of lab work. I made him aware of results. He said the pt wanted him to contact us also to discuss her SOB.  He states the pt is SOB and this has not improved since she came home from the hospital. At post hospital follow-up 2/20 the pt was given a 5 day course of lasix 20mg  and she has finished this supply.  The pt has a cough that produces clear phlegm. No fever and no obvious swelling. The pt's weight is down to 106 lbs. The pt's son feels like this is allergy related or a cold.  In reviewing the pt's medication list Fexofenadine is listed.  He is unsure if the pt has taken this medication because it is not unusual for the pt to stop medications when she is given a new medication to take. He will contact the pt to determine if she has been taking this medication.  He plans to contact PCP to see if they need to see her at this time.  I advised him to contact me with any other questions or concerns.

## 2017-03-04 ENCOUNTER — Other Ambulatory Visit: Payer: Self-pay | Admitting: Gastroenterology

## 2017-03-04 LAB — CBC WITH DIFFERENTIAL/PLATELET
BASOS ABS: 0.1 10*3/uL (ref 0.0–0.2)
Basos: 2 %
EOS (ABSOLUTE): 0.1 10*3/uL (ref 0.0–0.4)
EOS: 1 %
HEMATOCRIT: 26.5 % — AB (ref 34.0–46.6)
HEMOGLOBIN: 8.9 g/dL — AB (ref 11.1–15.9)
IMMATURE GRANS (ABS): 0 10*3/uL (ref 0.0–0.1)
Immature Granulocytes: 0 %
LYMPHS ABS: 1.1 10*3/uL (ref 0.7–3.1)
Lymphs: 20 %
MCH: 30.9 pg (ref 26.6–33.0)
MCHC: 33.6 g/dL (ref 31.5–35.7)
MCV: 92 fL (ref 79–97)
MONOCYTES: 7 %
Monocytes Absolute: 0.4 10*3/uL (ref 0.1–0.9)
NEUTROS ABS: 3.6 10*3/uL (ref 1.4–7.0)
Neutrophils: 70 %
Platelets: 244 10*3/uL (ref 150–379)
RBC: 2.88 x10E6/uL — AB (ref 3.77–5.28)
RDW: 15.2 % (ref 12.3–15.4)
WBC: 5.2 10*3/uL (ref 3.4–10.8)

## 2017-03-05 ENCOUNTER — Other Ambulatory Visit: Payer: Self-pay | Admitting: Physician Assistant

## 2017-03-05 ENCOUNTER — Telehealth: Payer: Self-pay | Admitting: Gastroenterology

## 2017-03-05 NOTE — Telephone Encounter (Signed)
PT SON RALPH WOULD LIKE TO KNOW IF ANY RESULTS HAVE COME BACK YET

## 2017-03-06 ENCOUNTER — Other Ambulatory Visit: Payer: Self-pay

## 2017-03-06 ENCOUNTER — Emergency Department
Admission: EM | Admit: 2017-03-06 | Discharge: 2017-03-06 | Disposition: A | Discharge status: Home / Self Care | Payer: Medicare Other | Attending: Emergency Medicine | Admitting: Emergency Medicine

## 2017-03-06 ENCOUNTER — Telehealth: Payer: Self-pay

## 2017-03-06 ENCOUNTER — Encounter: Payer: Self-pay | Admitting: Gastroenterology

## 2017-03-06 ENCOUNTER — Emergency Department: Payer: Medicare Other

## 2017-03-06 DIAGNOSIS — I5032 Chronic diastolic (congestive) heart failure: Secondary | ICD-10-CM | POA: Diagnosis not present

## 2017-03-06 DIAGNOSIS — R4182 Altered mental status, unspecified: Secondary | ICD-10-CM | POA: Diagnosis not present

## 2017-03-06 DIAGNOSIS — I10 Essential (primary) hypertension: Secondary | ICD-10-CM | POA: Insufficient documentation

## 2017-03-06 DIAGNOSIS — J45909 Unspecified asthma, uncomplicated: Secondary | ICD-10-CM | POA: Insufficient documentation

## 2017-03-06 DIAGNOSIS — I110 Hypertensive heart disease with heart failure: Secondary | ICD-10-CM | POA: Diagnosis not present

## 2017-03-06 DIAGNOSIS — F419 Anxiety disorder, unspecified: Secondary | ICD-10-CM | POA: Diagnosis not present

## 2017-03-06 LAB — URINALYSIS, COMPLETE (UACMP) WITH MICROSCOPIC
BILIRUBIN URINE: NEGATIVE
Bacteria, UA: NONE SEEN
Glucose, UA: NEGATIVE mg/dL
Ketones, ur: 5 mg/dL — AB
LEUKOCYTES UA: NEGATIVE
Nitrite: NEGATIVE
Protein, ur: NEGATIVE mg/dL
RBC / HPF: NONE SEEN RBC/hpf (ref 0–5)
SPECIFIC GRAVITY, URINE: 1.011 (ref 1.005–1.030)
pH: 5 (ref 5.0–8.0)

## 2017-03-06 LAB — BASIC METABOLIC PANEL
ANION GAP: 12 (ref 5–15)
BUN: 30 mg/dL — ABNORMAL HIGH (ref 6–20)
CHLORIDE: 105 mmol/L (ref 101–111)
CO2: 20 mmol/L — ABNORMAL LOW (ref 22–32)
Calcium: 9 mg/dL (ref 8.9–10.3)
Creatinine, Ser: 0.73 mg/dL (ref 0.44–1.00)
Glucose, Bld: 106 mg/dL — ABNORMAL HIGH (ref 65–99)
POTASSIUM: 3.5 mmol/L (ref 3.5–5.1)
SODIUM: 137 mmol/L (ref 135–145)

## 2017-03-06 LAB — CBC WITH DIFFERENTIAL/PLATELET
BASOS ABS: 0.1 10*3/uL (ref 0–0.1)
Basophils Relative: 2 %
Eosinophils Absolute: 0 10*3/uL (ref 0–0.7)
Eosinophils Relative: 1 %
HCT: 27.5 % — ABNORMAL LOW (ref 35.0–47.0)
HEMOGLOBIN: 8.8 g/dL — AB (ref 12.0–16.0)
LYMPHS ABS: 0.7 10*3/uL — AB (ref 1.0–3.6)
LYMPHS PCT: 13 %
MCH: 30 pg (ref 26.0–34.0)
MCHC: 32.1 g/dL (ref 32.0–36.0)
MCV: 93.5 fL (ref 80.0–100.0)
Monocytes Absolute: 0.3 10*3/uL (ref 0.2–0.9)
Monocytes Relative: 6 %
NEUTROS ABS: 4.1 10*3/uL (ref 1.4–6.5)
NEUTROS PCT: 78 %
PLATELETS: 219 10*3/uL (ref 150–440)
RBC: 2.95 MIL/uL — AB (ref 3.80–5.20)
RDW: 15.8 % — ABNORMAL HIGH (ref 11.5–14.5)
WBC: 5.3 10*3/uL (ref 3.6–11.0)

## 2017-03-06 LAB — TROPONIN I

## 2017-03-06 MED ORDER — LORAZEPAM 0.5 MG PO TABS: 0.5000 mg | ORAL_TABLET | Freq: Two times a day (BID) | ORAL | 0 refills | Status: DC

## 2017-03-06 MED ORDER — SODIUM CHLORIDE 0.9 % IV SOLN
Freq: Once | INTRAVENOUS | Status: AC
Start: 2017-03-06 — End: 2017-03-06
  Administered 2017-03-06: 08:00:00 via INTRAVENOUS

## 2017-03-06 NOTE — ED Notes (Signed)
ED Provider at bedside. 

## 2017-03-06 NOTE — ED Provider Notes (Signed)
[facility] Emergency Department Provider Note       Time seen: ----------------------------------------- 7:42 AM on 03/06/2017 ----------------------------------------- Level V caveat: History/ROS limited by altered mental status  I have reviewed the triage vital signs and the nursing notes.  HISTORY   Chief Complaint Hypertension and Altered Mental Status    HPI Kelsey Le is a 84 y.o. female with a history of anxiety, asthma, CHF, anemia, aortic stenosis who presents to the ED for hypertension and altered mental status.  According to EMS patient stated her blood pressure would not go down so she called EMS.  She denies being on blood pressure medication.  Past Medical History:  Diagnosis Date  . Anxiety   . Asthma   . AVM (arteriovenous malformation)    a. small intestine with recurrent GI bleeding.   . Chronic diastolic CHF (congestive heart failure) (HCC)   . Hip fracture (HCC)    a. in the setting of mechanical fall  . Iron deficiency anemia    a. requiring periodic transfusions  . S/P TAVR (transcatheter aortic valve replacement)    a. 02/12/17: s/p TAVR w/ an Edwards Sapien 3 THV (size 23 mm, model # 9600TFX, serial # [id number])  . Severe aortic stenosis   . Syncope and collapse     Patient Active Problem List   Diagnosis Date Noted  . Pericardial effusion 02/15/2017  . S/P TAVR (transcatheter aortic valve replacement) 02/12/2017  . Acute on chronic diastolic heart failure (HCC) 02/12/2017  . Arteriovenous malformation (AVM) 02/12/2017  . Iron deficiency anemia 01/15/2016  . Severe aortic stenosis 11/16/2015  . Lichen sclerosus 10/31/2015  . Lumbar radiculitis 12/16/2013  . Osteoarthritis of hip 07/06/2013  . Trochanteric bursitis 07/06/2013    Past Surgical History:  Procedure Laterality Date  . APPENDECTOMY    . BACK SURGERY     back fusion  . CARDIAC CATHETERIZATION    . ESOPHAGOGASTRODUODENOSCOPY (EGD) WITH PROPOFOL N/A  11/15/2015   Procedure: ESOPHAGOGASTRODUODENOSCOPY (EGD) WITH PROPOFOL;  Surgeon: [name], MD;  Location: [facility] ENDOSCOPY;  Service: Endoscopy;  Laterality: N/A;  . ESOPHAGOGASTRODUODENOSCOPY (EGD) WITH PROPOFOL N/A 10/12/2016   Procedure: ESOPHAGOGASTRODUODENOSCOPY (EGD) WITH PROPOFOL;  Surgeon: [name], MD;  Location: [facility] ENDOSCOPY;  Service: Endoscopy;  Laterality: N/A;  . FEMUR SURGERY     broken with rod  . FRACTURE SURGERY    . GIVENS CAPSULE STUDY N/A 02/20/2016   Procedure: GIVENS CAPSULE STUDY;  Surgeon: Scot Jun, MD;  Location: [facility] ENDOSCOPY;  Service: Endoscopy;  Laterality: N/A;  . HIP FRACTURE SURGERY    . INTRAMEDULLARY (IM) NAIL INTERTROCHANTERIC Right 11/18/2015   Procedure: INTRAMEDULLARY (IM) NAIL INTERTROCHANTRIC;  Surgeon: Juanell Fairly, MD;  Location: [facility] ORS;  Service: Orthopedics;  Laterality: Right;  . KYPHOPLASTY N/A 08/19/2014   Procedure: KYPHOPLASTY;  Surgeon: Kennedy Bucker, MD;  Location: [facility] ORS;  Service: Orthopedics;  Laterality: N/A;  . PARTIAL HYSTERECTOMY    . RIGHT/LEFT HEART CATH AND CORONARY ANGIOGRAPHY N/A 10/05/2016   Procedure: RIGHT/LEFT HEART CATH AND CORONARY ANGIOGRAPHY;  Surgeon: Kathleene Hazel, MD;  Location: [facility] INVASIVE CV LAB;  Service: Cardiovascular;  Laterality: N/A;  . TEE WITHOUT CARDIOVERSION N/A 02/12/2017   Procedure: TRANSESOPHAGEAL ECHOCARDIOGRAM (TEE);  Surgeon: Kathleene Hazel, MD;  Location: [facility] OR;  Service: Open Heart Surgery;  Laterality: N/A;  . TRANSCATHETER AORTIC VALVE REPLACEMENT, TRANSFEMORAL N/A 02/12/2017   Procedure: TRANSCATHETER AORTIC VALVE REPLACEMENT, TRANSFEMORAL using a 23mm Edwards Sapien 3 Aortic Valve;  Surgeon: Kathleene Hazel,  MD;  Location: [facility] OR;  Service: Open Heart Surgery;  Laterality: N/A;  . TUBAL LIGATION      Allergies Aspirin and Penicillins  Social History Social History   Tobacco Use  . Smoking status: Never Smoker  . Smokeless tobacco: Never Used   Substance Use Topics  . Alcohol use: No  . Drug use: No    Review of Systems Unknown at this time, patient with altered mental status  ____________________________________________   PHYSICAL EXAM:  VITAL SIGNS: ED Triage Vitals  Enc Vitals Group     BP 03/06/17 0639 (!) 190/69     Pulse Rate 03/06/17 0639 (!) 104     Resp 03/06/17 0639 19     Temp 03/06/17 0639 98 F (36.7 C)     Temp Source 03/06/17 0639 Oral     SpO2 03/06/17 0639 100 %     Weight 03/06/17 0640 108 lb (49 kg)     Height 03/06/17 0640 5\' 2"  (1.575 m)     Head Circumference --      Peak Flow --      Pain Score --      Pain Loc --      Pain Edu? --      Excl. in GC? --    Constitutional: Alert but disoriented, well appearing and in no distress. Eyes: Conjunctivae are normal. Normal extraocular movements. ENT   Head: Normocephalic and atraumatic.   Nose: No congestion/rhinnorhea.   Mouth/Throat: Mucous membranes are moist.   Neck: No stridor. Cardiovascular: Normal rate, regular rhythm. No murmurs, rubs, or gallops. Respiratory: Normal respiratory effort without tachypnea nor retractions. Breath sounds are clear and equal bilaterally. No wheezes/rales/rhonchi. Gastrointestinal: Soft and nontender. Normal bowel sounds Musculoskeletal: Nontender with normal range of motion in extremities. No lower extremity tenderness nor edema. Neurologic:  Normal speech and language. No gross focal neurologic deficits are appreciated.  Skin:  Skin is warm, dry and intact. No rash noted. Psychiatric: Mood and affect are normal. Speech and behavior are normal.  ____________________________________________  EKG: Interpreted by me.  Sinus tachycardia with a rate of 102 bpm, left bundle branch block, normal axis, normal QT  ____________________________________________  ED COURSE:  As part of my medical decision making, I reviewed the following data within the electronic MEDICAL RECORD NUMBER History obtained  from family if available, nursing notes, old chart and ekg, as well as notes from prior ED visits. Patient presented for altered mental status, we will assess with labs and imaging as indicated at this time.   Procedures ____________________________________________   LABS (pertinent positives/negatives)  Labs Reviewed  URINALYSIS, COMPLETE (UACMP) WITH MICROSCOPIC - Abnormal; Notable for the following components:      Result Value   Color, Urine STRAW (*)    APPearance CLEAR (*)    Hgb urine dipstick SMALL (*)    Ketones, ur 5 (*)    Squamous Epithelial / LPF 0-5 (*)    All other components within normal limits  BASIC METABOLIC PANEL - Abnormal; Notable for the following components:   CO2 20 (*)    Glucose, Bld 106 (*)    BUN 30 (*)    All other components within normal limits  CBC WITH DIFFERENTIAL/PLATELET - Abnormal; Notable for the following components:   RBC 2.95 (*)    Hemoglobin 8.8 (*)    HCT 27.5 (*)    RDW 15.8 (*)    Lymphs Abs 0.7 (*)    All other components within normal limits  TROPONIN I    RADIOLOGY Images were viewed by me  CT head, chest x-ray Did not reveal any acute process ____________________________________________  DIFFERENTIAL DIAGNOSIS   Dehydration, electrolyte abnormality, CVA, dementia, occult infection, anxiety and depression  FINAL ASSESSMENT AND PLAN  Altered mental status, anxiety   Plan: The patient had presented for altered mental status and hypertension. Patient's labs are reassuring and her baseline. Patient's imaging reveal any acute process.  I spent an extensive time talking to the family.  Daughter states there have been concerns for depression and/or anxiety.  Her husband recently has been deemed incompetent due to his dementia.  Patient has been having some anxious behavior.  We will try antianxiety medicines but overall she appears to be at her baseline and will follow-up as an outpatient with her doctor tomorrow as  scheduled.   Ulice DashJohnathan E Williams, MD   Note: This note was generated in part or whole with voice recognition software. Voice recognition is usually quite accurate but there are transcription errors that can and very often do occur. I apologize for any typographical errors that were not detected and corrected.     Emily FilbertWilliams, Jonathan E, MD 03/06/17 1021

## 2017-03-06 NOTE — ED Notes (Addendum)
During pt assessment, pt rambling and having flight of ideas. Pt is concerned about her 2 children stating "they haven't seen me and I don't want them to know I am here, don't tell them I am here because I may die." Pt continues to talk about dying and stating she had a heart valve replacement on the 12th of Feb. When pt is asked what brought her in pt states "when he answered the telephone, he thought I was joking, I told him I think they have been waiting for me to die all day." EDP notified, blood work ordered. Pt denies hx of alzheimer's/dementia. Pt notified urine sample is needed, pt states she went to the [facility] prior to arriving here.

## 2017-03-06 NOTE — Telephone Encounter (Signed)
Advised family per Dr. Tobi BastosAnna that Hb has increased to 8.9.

## 2017-03-06 NOTE — ED Notes (Signed)
Patient transported to CT 

## 2017-03-06 NOTE — ED Triage Notes (Signed)
Per EMS, reports pt has high BP, pt reports hx of elevated BP. Pt states BP would not go down and called EMS. Pt denies being on BP medication.

## 2017-03-07 ENCOUNTER — Ambulatory Visit (INDEPENDENT_AMBULATORY_CARE_PROVIDER_SITE_OTHER): Payer: Medicare Other | Admitting: Gastroenterology

## 2017-03-07 DIAGNOSIS — D500 Iron deficiency anemia secondary to blood loss (chronic): Secondary | ICD-10-CM | POA: Diagnosis not present

## 2017-03-07 NOTE — Progress Notes (Signed)
[name] MD, MRCP(U.K) [address]  [address]  [place], [place] [address]  Main: [phone]  Fax: [phone]   Primary Care Physician: Marguarite Arbour, MD  Primary Gastroenterologist:  Dr. [name]   No chief complaint on file.   HPI: Kelsey Le is a 84 y.o. female   Summary of history :  She is here today to see me for a hospital follow up. I was consulted to see her when she was admitted in 12/2016 .has a history of iron deficiency anemia attributed to chronic GI blood loss. She does have severe aortic stenosis. She has had on and off melena for a long time. She is followed by hematology in terms of her anemia. She sees Dr. Mechele Collin as an outpatient. Last office visit was in February 2018. She has had multiple upper endoscopies over the years. Colonoscopy last performed in 09/20/2013 at [facility] for iron deficiency anemia was normal. 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 She recently underwent an upper endoscopy on 10/12/2016 by Dr. Charolotte Capuchin and she presented with melena and he noted diffuse mild inflammation found in the stomach examined portion of the duodenum was normal. She underwent a capsule study in May 2018 with Dr. Mechele Collin which showed AVMs in the small intestine time the proximal portion of small intestine approximately 51 minutes into the procedure. Multiple was seen for the next 20-30 minutes it is not clear subsequently what the subsequent plan was in terms of management of these AVMs I do not see any further office notes with [facility] to indicate a clear plan of action with regards to management of the AVMs. In the interim she was treated for H. pylori by Dr. Maryan Puls was admitted on 12/21/2016 when she presented to the emergency room with melena of 4 days duration and generalized weakness. Dr. Judithann Sheen ordered a CBC which demonstrated a hemoglobin of 6.7 and was sent to   the ER for further evaluation.She has had on and off melena for "years " per patient . 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   Interval history 02/22/2016 -03/07/17  She is presently very confused, brown stools, no bleeding. Being scheduled for MRI .   CBC Latest Ref Rng & Units 03/06/2017 03/04/2017 02/25/2017  WBC 3.6 - 11.0 K/uL 5.3 5.2 6.0  Hemoglobin 12.0 - 16.0 g/dL 1.3(Y) 8.9(L) 8.1(L)  Hematocrit 35.0 - 47.0 % 27.5(L) 26.5(L) 25.7(L)  Platelets 150 - 440 K/uL 219 244 294     Current Outpatient Medications  Medication Sig Dispense Refill  . fluticasone (FLONASE) 50 MCG/ACT nasal spray Place into the nose.    . loratadine (CLARITIN) 10 MG tablet Take by mouth.    Marland Kitchen acetaminophen (TYLENOL) 325 MG tablet Take 325 mg by mouth every 6 (six) hours as needed for moderate pain or headache.     . Cholecalciferol (VITAMIN D3 PO) Take 1 capsule by mouth daily.    Marland Kitchen Fexofenadine HCl (ALLEGRA ALLERGY CHILDRENS PO) Take 1 tablet by mouth daily.    . furosemide (LASIX) 20 MG tablet Take 1 tablet (20 mg total) by mouth daily. (Patient not taking: Reported on 03/06/2017) 5 tablet 0  . LORazepam (ATIVAN) 0.5 MG tablet Take 1  tablet (0.5 mg total) by mouth 2 (two) times daily. 60 tablet 0  . omeprazole (PRILOSEC) 20 MG capsule Take 1 capsule (20 mg total) by mouth daily. (Patient not taking: Reported on 03/06/2017) 90 capsule 3  . vitamin B-12 (CYANOCOBALAMIN) 1000 MCG tablet Take 1,000 mcg by mouth daily.     No current facility-administered medications for this visit.     Allergies as of 03/07/2017 - Review Complete 03/06/2017  Allergen Reaction Noted  . Aspirin Other (See Comments) 10/15/2016  . Penicillins Rash and Other (See  Comments) 04/24/2013    ROS:  General: Negative for anorexia, weight loss, fever, chills, fatigue, weakness. ENT: Negative for hoarseness, difficulty swallowing , nasal congestion. CV: Negative for chest pain, angina, palpitations, dyspnea on exertion, peripheral edema.  Respiratory: Negative for dyspnea at rest, dyspnea on exertion, cough, sputum, wheezing.  GI: See history of present illness. GU:  Negative for dysuria, hematuria, urinary incontinence, urinary frequency, nocturnal urination.  Endo: Negative for unusual weight change.    Physical Examination:   There were no vitals taken for this visit.  General: very thin in no acute distress.  Eyes: No icterus. Conjunctivae pink. Mouth: Oropharyngeal mucosa moist and pink , no lesions erythema or exudate. Lungs: Clear to auscultation bilaterally. Non-labored. Heart: Regular rate and rhythm, no murmurs rubs or gallops.  Abdomen: Bowel sounds are normal, nontender, nondistended, no hepatosplenomegaly or masses, no abdominal bruits or hernia , no rebound or guarding.   Extremities: No lower extremity edema. No clubbing or deformities. Neuro: Alert and oriented x 2.   Skin: Warm and dry, no jaundice.   Psych: Alert and cooperative,confused Axo x 2   Imaging Studies: Dg Chest 2 View  Result Date: 02/06/2017 CLINICAL DATA:  Severe aortic stenosis.  Preoperative study. EXAM: CHEST  2 VIEW COMPARISON:  Chest x-ray dated November 17, 2015. FINDINGS: Stable mild cardiomegaly. Normal pulmonary vascularity. Atherosclerotic calcification of the aortic arch. No focal consolidation, pleural effusion, or pneumothorax. Prior T10 and L2 vertebroplasties. Unchanged mild compression deformity of L1. IMPRESSION: No active cardiopulmonary disease. Electronically Signed   By: Obie Dredge M.D.   On: 02/06/2017 17:01   Ct Head Wo Contrast  Result Date: 03/06/2017 CLINICAL DATA:  Altered level of consciousness.  Hypertension EXAM: CT HEAD WITHOUT  CONTRAST TECHNIQUE: Contiguous axial images were obtained from the base of the skull through the vertex without intravenous contrast. COMPARISON:  09/29/2016 FINDINGS: Brain: No evidence of acute infarction, hemorrhage, hydrocephalus, extra-axial collection or mass lesion/mass effect. Generalized atrophy and mild chronic small vessel ischemia. Vascular: Atherosclerotic calcification.  No hyperdense vessel. Skull: Negative Sinuses/Orbits: Bilateral cataract resection.  No acute finding. IMPRESSION: Senescent changes without new or acute finding when compared to 2018. Electronically Signed   By: Marnee Spring M.D.   On: 03/06/2017 07:37   Dg Chest Port 1 View  Result Date: 03/06/2017 CLINICAL DATA:  Hypertension EXAM: PORTABLE CHEST 1 VIEW COMPARISON:  02/12/2017 FINDINGS: Chronic cardiomegaly. Status post transcatheter aortic valve replacement. Chronic hyperinflation. There is no edema, consolidation, effusion, or pneumothorax. Remote compression fracture of the thoracic spine with vertebroplasty. IMPRESSION: No acute finding when compared to prior. Electronically Signed   By: Marnee Spring M.D.   On: 03/06/2017 07:00   Dg Chest Port 1 View  Result Date: 02/12/2017 CLINICAL DATA:  Status post aortic valve replacement. Aortic stenosis. EXAM: PORTABLE CHEST 1 VIEW COMPARISON:  February 07, 2007 FINDINGS: There is an aortic valve replacement. Central catheter tip is in the superior vena cava.  No pneumothorax. There is slight scarring in the left base. There is no edema or consolidation. Heart size and pulmonary vascularity are normal. No adenopathy. Patient has undergone previous kyphoplasty procedures at T10 and L2, stable. IMPRESSION: Status post aortic valve replacement. Central catheter tip in superior vena cava. No pneumothorax. Scarring left base. No edema or consolidation. Stable cardiac silhouette. There is aortic atherosclerosis. Aortic Atherosclerosis (ICD10-I70.0). Electronically Signed   By:  Bretta BangWilliam  Woodruff III M.D.   On: 02/12/2017 14:15    Assessment and Plan:   [name] Obey is a 84 y.o. y/o female S/p TAVR on 02/12/17 for severe aortic stenosis, long standing iron deficiency anemia , known to have had small bowel AVm's in the past , not undergone any prior GI therapy such as ablation for unclear reasons. My impression is that the AVM's are secondary to the Aortic stenosis,Hb stable since 2 weeks . Brown stool presently. Present issue is acute confusion , she is being evaluated with MRI and neurology soon.    1. [facility], suggest she establish care with them once confusion resolves, she may possibly not require any intervention if the bleeding ceases due to repair of the heart valve. In the menawhile will monitor her CBC. Recheck CBC in 3 weeks   2.  No NSAID's   Dr Wyline MoodKiran Mirtha Jain  MD,MRCP [facility]([place]) Follow up in 8 weeks

## 2017-03-08 ENCOUNTER — Other Ambulatory Visit: Payer: Self-pay | Admitting: Internal Medicine

## 2017-03-08 DIAGNOSIS — R4182 Altered mental status, unspecified: Secondary | ICD-10-CM

## 2017-03-13 ENCOUNTER — Encounter: Payer: Self-pay | Admitting: Cardiovascular Disease

## 2017-03-13 ENCOUNTER — Other Ambulatory Visit: Payer: Self-pay

## 2017-03-13 ENCOUNTER — Ambulatory Visit (HOSPITAL_COMMUNITY): Payer: Medicare Other | Attending: Cardiology

## 2017-03-13 ENCOUNTER — Ambulatory Visit (INDEPENDENT_AMBULATORY_CARE_PROVIDER_SITE_OTHER): Payer: Medicare Other | Admitting: Cardiovascular Disease

## 2017-03-13 VITALS — BP 196/74 | HR 111 | Ht 62.0 in

## 2017-03-13 DIAGNOSIS — I313 Pericardial effusion (noninflammatory): Secondary | ICD-10-CM | POA: Insufficient documentation

## 2017-03-13 DIAGNOSIS — I350 Nonrheumatic aortic (valve) stenosis: Secondary | ICD-10-CM | POA: Insufficient documentation

## 2017-03-13 DIAGNOSIS — I3139 Other pericardial effusion (noninflammatory): Secondary | ICD-10-CM

## 2017-03-13 DIAGNOSIS — Z952 Presence of prosthetic heart valve: Secondary | ICD-10-CM | POA: Diagnosis not present

## 2017-03-13 DIAGNOSIS — Z8249 Family history of ischemic heart disease and other diseases of the circulatory system: Secondary | ICD-10-CM | POA: Insufficient documentation

## 2017-03-13 DIAGNOSIS — J45909 Unspecified asthma, uncomplicated: Secondary | ICD-10-CM | POA: Insufficient documentation

## 2017-03-13 DIAGNOSIS — I509 Heart failure, unspecified: Secondary | ICD-10-CM | POA: Diagnosis not present

## 2017-03-13 NOTE — Progress Notes (Signed)
Multi-Disciplinary Valve Clinic Note  Chief Complaint  Patient presents with  . Follow-up    s/p TAVR    History of Present Illness: 84 yo female with history of iron deficiency anemia, prior GI bleeding due to AVMs, asthma and severe aortic valve stenosis now s/p TAVR who is here today for one month TAVR follow up. I saw her as a new consult 10/01/16 at the request of Dr. Kirke Corin for discussion regarding her aortic valve stenosis and possible TAVR. Due to ongoing GI bleeding, her TAVR was delayed. Her GI bleeding was felt to be due to AVMs. She was started on ASA for her cardiac cath and had severe anemia due to GI bleeding on ASA alone. Ultimately, we decided to proceed with TAVR without the use of ASA or Plavix. She underwent TAVR on 02/12/17 with placement of a 23 mm Edwards Sapien 3 THV via the transfemoral approach. She developed a small pericardial effusion post procedure which remained stable on serial echocardiograms. She was not discharged on ASA or Plavix. She was discharged on colchicine given chest pain that was felt to be pericarditis.    She is here today for follow up. The patient denies any chest pain, dyspnea, palpitations, lower extremity edema, orthopnea, PND, dizziness, near syncope or syncope. She has had issues with confusion over the last few days. She had a normal head CT. Follow up planned with Neurology and an MRI of her brain is planned for next week. Her son and daughter felt that her confusion may be contrived because after a talk with her this am, she returned to baseline. Possibly some depression. She is much better this am.    Primary Care Physician: Marguarite Arbour, MD Primary Cardiologist: Kirke Corin   Past Medical History:  Diagnosis Date  . Anxiety   . Asthma   . AVM (arteriovenous malformation)    a. small intestine with recurrent GI bleeding.   . Chronic diastolic CHF (congestive heart failure) (HCC)   . Hip fracture (HCC)    a. in the setting of mechanical  fall  . Iron deficiency anemia    a. requiring periodic transfusions  . S/P TAVR (transcatheter aortic valve replacement)    a. 02/12/17: s/p TAVR w/ an Edwards Sapien 3 THV (size 23 mm, model # 9600TFX, serial # [id number])  . Severe aortic stenosis   . Syncope and collapse     Past Surgical History:  Procedure Laterality Date  . APPENDECTOMY    . BACK SURGERY     back fusion  . CARDIAC CATHETERIZATION    . ESOPHAGOGASTRODUODENOSCOPY (EGD) WITH PROPOFOL N/A 11/15/2015   Procedure: ESOPHAGOGASTRODUODENOSCOPY (EGD) WITH PROPOFOL;  Surgeon: [name], MD;  Location: [facility] ENDOSCOPY;  Service: Endoscopy;  Laterality: N/A;  . ESOPHAGOGASTRODUODENOSCOPY (EGD) WITH PROPOFOL N/A 10/12/2016   Procedure: ESOPHAGOGASTRODUODENOSCOPY (EGD) WITH PROPOFOL;  Surgeon: [name], MD;  Location: [facility] ENDOSCOPY;  Service: Endoscopy;  Laterality: N/A;  . FEMUR SURGERY     broken with rod  . FRACTURE SURGERY    . GIVENS CAPSULE STUDY N/A 02/20/2016   Procedure: GIVENS CAPSULE STUDY;  Surgeon: Scot Jun, MD;  Location: [facility] ENDOSCOPY;  Service: Endoscopy;  Laterality: N/A;  . HIP FRACTURE SURGERY    . INTRAMEDULLARY (IM) NAIL INTERTROCHANTERIC Right 11/18/2015   Procedure: INTRAMEDULLARY (IM) NAIL INTERTROCHANTRIC;  Surgeon: Juanell Fairly, MD;  Location: [facility] ORS;  Service: Orthopedics;  Laterality: Right;  . KYPHOPLASTY N/A 08/19/2014   Procedure: KYPHOPLASTY;  Surgeon: Kennedy Bucker, MD;  Location: [facility] ORS;  Service: Orthopedics;  Laterality: N/A;  . PARTIAL HYSTERECTOMY    . RIGHT/LEFT HEART CATH AND CORONARY ANGIOGRAPHY N/A 10/05/2016   Procedure: RIGHT/LEFT HEART CATH AND CORONARY ANGIOGRAPHY;  Surgeon: Kathleene HazelMcAlhany, Keshonna Valvo D, MD;  Location: [facility];  Service: Cardiovascular;  Laterality: N/A;  . TEE WITHOUT CARDIOVERSION N/A 02/12/2017   Procedure: TRANSESOPHAGEAL ECHOCARDIOGRAM (TEE);  Surgeon: Kathleene HazelMcAlhany, Jonhatan Hearty D, MD;  [facility];  Service: Open Heart Surgery;   Laterality: N/A;  . TRANSCATHETER AORTIC VALVE REPLACEMENT, TRANSFEMORAL N/A 02/12/2017   Procedure: TRANSCATHETER AORTIC VALVE REPLACEMENT, TRANSFEMORAL using a 23mm Edwards Sapien 3 Aortic Valve;  Surgeon: Kathleene HazelMcAlhany, Terah Robey D, MD;  Location: [facility] OR;  Service: Open Heart Surgery;  Laterality: N/A;  . TUBAL LIGATION      Current Outpatient Medications  Medication Sig Dispense Refill  . acetaminophen (TYLENOL) 325 MG tablet Take 325 mg by mouth every 6 (six) hours as needed for moderate pain or headache.     . Cholecalciferol (VITAMIN D3 PO) Take 1 capsule by mouth daily.    . vitamin B-12 (CYANOCOBALAMIN) 1000 MCG tablet Take 1,000 mcg by mouth daily.    Marland Kitchen. Fexofenadine HCl (ALLEGRA ALLERGY CHILDRENS PO) Take 1 tablet by mouth daily.    . fluticasone (FLONASE) 50 MCG/ACT nasal spray Place into the nose.    . furosemide (LASIX) 20 MG tablet Take 1 tablet (20 mg total) by mouth daily. (Patient not taking: Reported on 03/13/2017) 5 tablet 0  . loratadine (CLARITIN) 10 MG tablet Take by mouth.    Marland Kitchen. LORazepam (ATIVAN) 0.5 MG tablet Take 1 tablet (0.5 mg total) by mouth 2 (two) times daily. (Patient not taking: Reported on 03/13/2017) 60 tablet 0  . omeprazole (PRILOSEC) 20 MG capsule Take 1 capsule (20 mg total) by mouth daily. (Patient not taking: Reported on 03/13/2017) 90 capsule 3   No current facility-administered medications for this visit.     Allergies  Allergen Reactions  . Aspirin Other (See Comments)    GI bleeding   . Penicillins Rash and Other (See Comments)    PATIENT HAS HAD A PCN REACTION WITH IMMEDIATE RASH, FACIAL/TONGUE/THROAT SWELLING, SOB, OR LIGHTHEADEDNESS WITH HYPOTENSION:  #  #  #  YES  #  #  #   Has patient had a PCN reaction causing severe rash involving mucus membranes or skin necrosis: No Has patient had a PCN reaction that required hospitalization: No Has patient had a PCN reaction occurring within the last 10 years: No     Social History   Socioeconomic  History  . Marital status: Divorced    Spouse name: Not on file  . Number of children: Not on file  . Years of education: Not on file  . Highest education level: Not on file  Social Needs  . Financial resource strain: Not on file  . Food insecurity - worry: Not on file  . Food insecurity - inability: Not on file  . Transportation needs - medical: Not on file  . Transportation needs - non-medical: Not on file  Occupational History  . Not on file  Tobacco Use  . Smoking status: Never Smoker  . Smokeless tobacco: Never Used  Substance and Sexual Activity  . Alcohol use: No  . Drug use: No  . Sexual activity: Not on file  Other Topics Concern  . Not on file  Social History Narrative  . Not on file    Family History  Problem Relation Age of Onset  .  Heart block Mother   . Heart failure Father   . Heart disease Sister   . Breast cancer Maternal Aunt     Review of Systems:  As stated in the HPI and otherwise negative.   BP (!) 196/74   Pulse (!) 111   Ht 5\' 2"  (1.575 m)   SpO2 98%   BMI 19.75 kg/m   Physical Examination:  General: Well developed, well nourished, NAD  HEENT: OP clear, mucus membranes moist  SKIN: warm, dry. No rashes. Neuro: No focal deficits  Musculoskeletal: Muscle strength 5/5 all ext  Psychiatric: Mood and affect normal  Neck: No JVD, no carotid bruits, no thyromegaly, no lymphadenopathy.  Lungs:Clear bilaterally, no wheezes, rhonci, crackles Cardiovascular: Regular rate and rhythm.  Soft systolic murmur.  Abdomen:Soft. Bowel sounds present. Non-tender.  Extremities: No lower extremity edema. Pulses are 2 + in the bilateral DP/PT.  Echo 03/13/17: - Left ventricle: The cavity size was normal. Wall thickness was   increased in a pattern of moderate LVH. Systolic function was   vigorous. The estimated ejection fraction was in the range of 65%   to 70%. Wall motion was normal; there were no regional wall   motion abnormalities. Doppler parameters  are consistent with   abnormal left ventricular relaxation (grade 1 diastolic   dysfunction). - Aortic valve: A bioprosthesis was present. Valve area (VTI): 1.86   cm^2. Valve area (Vmax): 2.11 cm^2. Valve area (Vmean): 2.01   cm^2. - Pulmonary arteries: Systolic pressure was mildly to moderately   increased. PA peak pressure: 39 mm Hg (S). - Pericardium, extracardiac: A small pericardial effusion was   identified circumferential to the heart.  EKG:  EKG is not ordered today. The ekg ordered today demonstrates   Recent Labs: 02/06/2017: ALT 21; B Natriuretic Peptide 351.7 02/13/2017: Magnesium 1.8 02/20/2017: NT-Pro BNP 1,703; TSH 9.760 03/06/2017: BUN 30; Creatinine, Ser 0.73; Hemoglobin 8.8; Platelets 219; Potassium 3.5; Sodium 137   Lipid Panel No results found for: CHOL, TRIG, HDL, CHOLHDL, VLDL, LDLCALC, LDLDIRECT   Wt Readings from Last 3 Encounters:  03/06/17 108 lb (49 kg)  02/21/17 110 lb 12.8 oz (50.3 kg)  02/20/17 109 lb 12.8 oz (49.8 kg)    Assessment and Plan:   1. Severe aortic valve stenosis s/p TAVR: She is stable. TAVR performed on 02/12/17. Echo today shows normall LV function, normally functioning bioprosthetic aortic valve with no AI. The mean gradient is 14 mmHg. Will not start ASA or Plavix given her h/o GI bleeding due to AVMs   2. Pericardial effusion: Echo today shows stable small pericardial effusion. No evidence of tamponade  3. GI bleeding: This is being followed in the GI clinic. H/H has been stable on recent checks.   Current medicines are reviewed at length with the patient today.  The patient does not have concerns regarding medicines.  The following changes have been made:  no change  Labs/ tests ordered today include:   No orders of the defined types were placed in this encounter.  Disposition:   FU with the TAVR team in one year. Follow up Dr.Arida in 6 months.    Signed, Verne Carrow, MD 03/13/2017 2:02 PM    [facility] [address] [place], [place], [place]  [address] Phone: [phone]; Fax: [phone]

## 2017-03-13 NOTE — Patient Instructions (Signed)
Medication Instructions:  Your physician recommends that you continue on your current medications as directed. Please refer to the Current Medication list given to you today.   Labwork: none  Testing/Procedures: none  Follow-Up: Your physician recommends that you schedule a follow-up appointment in: 3 months with Dr. Arida   Any Other Special Instructions Will Be Listed Below (If Applicable).     If you need a refill on your cardiac medications before your next appointment, please call your pharmacy.   

## 2017-03-15 ENCOUNTER — Other Ambulatory Visit (HOSPITAL_COMMUNITY): Payer: Self-pay | Admitting: Nurse Practitioner

## 2017-03-15 DIAGNOSIS — R4182 Altered mental status, unspecified: Secondary | ICD-10-CM

## 2017-03-19 ENCOUNTER — Emergency Department
Admission: EM | Admit: 2017-03-19 | Discharge: 2017-03-22 | Disposition: A | Discharge status: Home / Self Care | Payer: Medicare Other | Attending: Emergency Medicine | Admitting: Emergency Medicine

## 2017-03-19 ENCOUNTER — Other Ambulatory Visit: Payer: Self-pay

## 2017-03-19 DIAGNOSIS — D649 Anemia, unspecified: Secondary | ICD-10-CM

## 2017-03-19 DIAGNOSIS — R4182 Altered mental status, unspecified: Secondary | ICD-10-CM | POA: Diagnosis present

## 2017-03-19 DIAGNOSIS — F0391 Unspecified dementia with behavioral disturbance: Secondary | ICD-10-CM | POA: Diagnosis not present

## 2017-03-19 DIAGNOSIS — I5032 Chronic diastolic (congestive) heart failure: Secondary | ICD-10-CM | POA: Insufficient documentation

## 2017-03-19 DIAGNOSIS — J45909 Unspecified asthma, uncomplicated: Secondary | ICD-10-CM | POA: Diagnosis not present

## 2017-03-19 DIAGNOSIS — F23 Brief psychotic disorder: Secondary | ICD-10-CM

## 2017-03-19 DIAGNOSIS — F0392 Unspecified dementia, unspecified severity, with psychotic disturbance: Secondary | ICD-10-CM

## 2017-03-19 DIAGNOSIS — Z79899 Other long term (current) drug therapy: Secondary | ICD-10-CM | POA: Diagnosis not present

## 2017-03-19 LAB — CBC
HCT: 29.8 % — ABNORMAL LOW (ref 35.0–47.0)
HEMOGLOBIN: 9.5 g/dL — AB (ref 12.0–16.0)
MCH: 29 pg (ref 26.0–34.0)
MCHC: 32 g/dL (ref 32.0–36.0)
MCV: 90.6 fL (ref 80.0–100.0)
Platelets: 149 10*3/uL — ABNORMAL LOW (ref 150–440)
RBC: 3.29 MIL/uL — ABNORMAL LOW (ref 3.80–5.20)
RDW: 15 % — ABNORMAL HIGH (ref 11.5–14.5)
WBC: 4.9 10*3/uL (ref 3.6–11.0)

## 2017-03-19 LAB — URINALYSIS, COMPLETE (UACMP) WITH MICROSCOPIC
BACTERIA UA: NONE SEEN
BILIRUBIN URINE: NEGATIVE
GLUCOSE, UA: NEGATIVE mg/dL
HGB URINE DIPSTICK: NEGATIVE
KETONES UR: 5 mg/dL — AB
LEUKOCYTES UA: NEGATIVE
NITRITE: NEGATIVE
Protein, ur: NEGATIVE mg/dL
SPECIFIC GRAVITY, URINE: 1.01 (ref 1.005–1.030)
pH: 7 (ref 5.0–8.0)

## 2017-03-19 LAB — COMPREHENSIVE METABOLIC PANEL
ALT: 18 U/L (ref 14–54)
AST: 34 U/L (ref 15–41)
Albumin: 4.5 g/dL (ref 3.5–5.0)
Alkaline Phosphatase: 58 U/L (ref 38–126)
Anion gap: 12 (ref 5–15)
BUN: 28 mg/dL — AB (ref 6–20)
CHLORIDE: 108 mmol/L (ref 101–111)
CO2: 23 mmol/L (ref 22–32)
Calcium: 9.1 mg/dL (ref 8.9–10.3)
Creatinine, Ser: 0.79 mg/dL (ref 0.44–1.00)
GFR calc Af Amer: >60 mL/min (ref >60)
Glucose, Bld: 92 mg/dL (ref 65–99)
Potassium: 3 mmol/L — ABNORMAL LOW (ref 3.5–5.1)
Sodium: 143 mmol/L (ref 135–145)
Total Bilirubin: 1 mg/dL (ref 0.3–1.2)
Total Protein: 7.7 g/dL (ref 6.5–8.1)

## 2017-03-19 LAB — SALICYLATE LEVEL: Salicylate Lvl: <7 mg/dL (ref 2.8–30.0)

## 2017-03-19 LAB — PROTIME-INR
INR: 1.06
Prothrombin Time: 13.7 seconds (ref 11.4–15.2)

## 2017-03-19 LAB — TSH: TSH: 4.269 u[IU]/mL (ref 0.350–4.500)

## 2017-03-19 LAB — ACETAMINOPHEN LEVEL

## 2017-03-19 LAB — ETHANOL: Alcohol, Ethyl (B): <10 mg/dL (ref <10)

## 2017-03-19 MED ORDER — VITAMIN B-12 1000 MCG PO TABS
1000.0000 ug | ORAL_TABLET | Freq: Every day | ORAL | Status: DC
  Administered 2017-03-19 – 2017-03-22 (×4): 1000 ug via ORAL
  Filled 2017-03-19 (×4): qty 1

## 2017-03-19 MED ORDER — LORATADINE 10 MG PO TABS
10.0000 mg | ORAL_TABLET | Freq: Every day | ORAL | Status: DC
  Administered 2017-03-19 – 2017-03-22 (×4): 10 mg via ORAL
  Filled 2017-03-19 (×4): qty 1

## 2017-03-19 MED ORDER — FUROSEMIDE 40 MG PO TABS
20.0000 mg | ORAL_TABLET | Freq: Every day | ORAL | Status: DC
  Administered 2017-03-19 – 2017-03-22 (×4): 20 mg via ORAL
  Filled 2017-03-19 (×4): qty 1

## 2017-03-19 MED ORDER — PANTOPRAZOLE SODIUM 40 MG PO TBEC
40.0000 mg | DELAYED_RELEASE_TABLET | Freq: Every day | ORAL | Status: DC
  Administered 2017-03-19 – 2017-03-22 (×4): 40 mg via ORAL
  Filled 2017-03-19 (×4): qty 1

## 2017-03-19 MED ORDER — RISPERIDONE 0.5 MG PO TBDP
0.5000 mg | ORAL_TABLET | Freq: Every day | ORAL | Status: DC
  Administered 2017-03-19 – 2017-03-21 (×3): 0.5 mg via ORAL
  Filled 2017-03-19 (×3): qty 1

## 2017-03-19 MED ORDER — RISPERIDONE 0.5 MG PO TBDP
0.5000 mg | ORAL_TABLET | ORAL | Status: AC
  Administered 2017-03-19: 0.5 mg via ORAL
  Filled 2017-03-19: qty 1

## 2017-03-19 MED ORDER — CHOLECALCIFEROL 10 MCG (400 UNIT) PO TABS
400.0000 [IU] | ORAL_TABLET | Freq: Every day | ORAL | Status: DC
  Administered 2017-03-19 – 2017-03-22 (×4): 400 [IU] via ORAL
  Filled 2017-03-19 (×4): qty 1

## 2017-03-19 NOTE — Clinical Social Work Note (Signed)
CSW received consult for "ed." CSW spoke with psychiatrist- Dr. Toni Amendlapacs. Patient recommended for Gero-psych. TTS aware and following patient for placement. Per Dr. Toni Amendlapacs, family has been working on appropriate placement for patient. CSW signing off. Please reconsult if new Social Work needs arise.    Corlis HoveJeneya Genavie Boettger, Theresia MajorsLCSWA, [facility] Clinical Social Worker-ED [phone]

## 2017-03-19 NOTE — ED Notes (Signed)
Pt ambulatory with assistance, steady gait noted

## 2017-03-19 NOTE — Consult Note (Signed)
[facility] Face-to-Face Psychiatry Consult   Reason for Consult: Consult for 84 year old woman with little past psychiatric history.  Patient brought in because of psychosis Referring Physician: McShane Patient Identification: Kelsey Le MRN:  [id number] Principal Diagnosis: Dementia with psychosis Diagnosis:   Patient Active Problem List   Diagnosis Date Noted  . Dementia with psychosis [F03.91] 03/19/2017  . Pericardial effusion [I31.3] 02/15/2017  . 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 Acute on chronic diastolic heart failure ([place]) [I50.33] 02/12/2017  . Arteriovenous malformation (AVM) [Q27.30] 02/12/2017  . Anemia [D64.9] 10/11/2016  . Iron deficiency anemia [D50.9] 01/15/2016  . Severe aortic stenosis [I35.0] 11/16/2015  . Lichen sclerosus [I09.7] 10/31/2015  . Lumbar radiculitis [M54.16] 12/16/2013  . Osteoarthritis of hip [M16.9] 07/06/2013  . Trochanteric bursitis [M70.60] 07/06/2013    Total Time spent with patient: 1 hour  Subjective:   Kelsey Le is a 84 y.o. female patient admitted with "they were trying to kill me".  HPI: Patient seen chart reviewed.  Spoke with TTS nursing and emergency room physician.  Spoke with the patient's son by telephone.  78 year old woman brought by EMS after family have been observing escalating agitated behavior now with psychosis.  Son reports that the patient's dementia has been known for months or longer but in the last few weeks things have progressed.  She has started to make more odd and paranoid statements.  Last night the son and the patient's daughter had to sit up with the patient all night long while the patient harangued to them and accused them of trying to kill her or other nefarious actions.  The patient is verbal but disorganized in her history.  Also talks about her children trying to kill her.  Also rambling only talks about her dog about being dead already.  Patient is not reporting suicidal  ideation and has not done anything to harm herself.  She has been apparently somewhat difficult and not fully compliant with her usual medications.  No specific new medical problems however and the patient is not abusing substances.  On interview the patient is not agitated to the point of being aggressive and has been cooperative for the most part but is certainly confused and paranoid.  Social history: Patient is separated from her husband.  Has at least 2 adult children who live close by.  Up until now had been living in an adult retirement center semi-independently.  Medical history: History of GI bleeding in the past and still has some chronic anemia.  History of a fairly recent transcatheter aortic valve replacement.  Osteoarthritis.  Substance abuse history: Does not drink no history of substance abuse  Past Psychiatric History: Patient has had mild anxiety issues in the past but no major psychiatric problems.  No history of psychiatric hospitalization no history of suicide attempts or violence.  No previously identified psychosis.  Primary care doctor had been prescribing some medicines recently to try and help with some of her sleep and agitation with only minimal success.  Risk to Self: Is patient at risk for suicide?: No Risk to Others:   Prior Inpatient Therapy:   Prior Outpatient Therapy:    Past Medical History:  Past Medical History:  Diagnosis Date  . Anxiety   . Asthma   . AVM (arteriovenous malformation)    a. small intestine with recurrent GI bleeding.   . Chronic diastolic CHF (congestive heart failure) ([place])   . Hip fracture ([place])    a. in  the setting of mechanical fall  . Iron deficiency anemia    a. requiring periodic transfusions  . S/P TAVR (transcatheter aortic valve replacement)    a. 02/12/17: s/p TAVR w/ an Edwards Sapien 3 THV (size 23 mm, model # 9600TFX, serial # [id number])  . Severe aortic stenosis   . Syncope and collapse     Past Surgical History:   Procedure Laterality Date  . APPENDECTOMY    . BACK SURGERY     back fusion  . CARDIAC CATHETERIZATION    . ESOPHAGOGASTRODUODENOSCOPY (EGD) WITH PROPOFOL N/A 11/15/2015   Procedure: ESOPHAGOGASTRODUODENOSCOPY (EGD) WITH PROPOFOL;  Surgeon: Lucilla Lame, MD;  Location: [facility] ENDOSCOPY;  Service: Endoscopy;  Laterality: N/A;  . ESOPHAGOGASTRODUODENOSCOPY (EGD) WITH PROPOFOL N/A 10/12/2016   Procedure: ESOPHAGOGASTRODUODENOSCOPY (EGD) WITH PROPOFOL;  Surgeon: Lucilla Lame, MD;  Location: [facility] ENDOSCOPY;  Service: Endoscopy;  Laterality: N/A;  . FEMUR SURGERY     broken with rod  . FRACTURE SURGERY    . GIVENS CAPSULE STUDY N/A 02/20/2016   Procedure: GIVENS CAPSULE STUDY;  Surgeon: Manya Silvas, MD;  Location: [facility] ENDOSCOPY;  Service: Endoscopy;  Laterality: N/A;  . HIP FRACTURE SURGERY    . INTRAMEDULLARY (IM) NAIL INTERTROCHANTERIC Right 11/18/2015   Procedure: INTRAMEDULLARY (IM) NAIL INTERTROCHANTRIC;  Surgeon: Thornton Park, MD;  Location: [facility] ORS;  Service: Orthopedics;  Laterality: Right;  . KYPHOPLASTY N/A 08/19/2014   Procedure: KYPHOPLASTY;  Surgeon: Hessie Knows, MD;  Location: [facility] ORS;  Service: Orthopedics;  Laterality: N/A;  . PARTIAL HYSTERECTOMY    . RIGHT/LEFT HEART CATH AND CORONARY ANGIOGRAPHY N/A 10/05/2016   Procedure: RIGHT/LEFT HEART CATH AND CORONARY ANGIOGRAPHY;  Surgeon: Burnell Blanks, MD;  Location: [facility];  Service: Cardiovascular;  Laterality: N/A;  . TEE WITHOUT CARDIOVERSION N/A 02/12/2017   Procedure: TRANSESOPHAGEAL ECHOCARDIOGRAM (TEE);  Surgeon: Burnell Blanks, MD;  Location: [facility];  Service: Open Heart Surgery;  Laterality: N/A;  . TRANSCATHETER AORTIC VALVE REPLACEMENT, TRANSFEMORAL N/A 02/12/2017   Procedure: TRANSCATHETER AORTIC VALVE REPLACEMENT, TRANSFEMORAL using a 31m Edwards Sapien 3 Aortic Valve;  Surgeon: MBurnell Blanks MD;  [place]  Service: Open Heart Surgery;  Laterality: N/A;  . TUBAL  LIGATION     Family History:  Family History  Problem Relation Age of Onset  . Heart block Mother   . Heart failure Father   . Heart disease Sister   . Breast cancer Maternal Aunt    Family Psychiatric  History: None known Social History:  Social History   Substance and Sexual Activity  Alcohol Use No     Social History   Substance and Sexual Activity  Drug Use No    Social History   Socioeconomic History  . Marital status: Divorced    Spouse name: Not on file  . Number of children: Not on file  . Years of education: Not on file  . Highest education level: Not on file  Social Needs  . Financial resource strain: Not on file  . Food insecurity - worry: Not on file  . Food insecurity - inability: Not on file  . Transportation needs - medical: Not on file  . Transportation needs - non-medical: Not on file  Occupational History  . Not on file  Tobacco Use  . Smoking status: Never Smoker  . Smokeless tobacco: Never Used  Substance and Sexual Activity  . Alcohol use: No  . Drug use: No  . Sexual activity: Not on file  Other Topics  Concern  . Not on file  Social History Narrative  . Not on file   Additional Social History:    Allergies:   Allergies  Allergen Reactions  . Aspirin Other (See Comments)    GI bleeding   . Penicillins Rash and Other (See Comments)    PATIENT HAS HAD A PCN REACTION WITH IMMEDIATE RASH, FACIAL/TONGUE/THROAT SWELLING, SOB, OR LIGHTHEADEDNESS WITH HYPOTENSION:  #  #  #  YES  #  #  #   Has patient had a PCN reaction causing severe rash involving mucus membranes or skin necrosis: No Has patient had a PCN reaction that required hospitalization: No Has patient had a PCN reaction occurring within the last 10 years: No     Labs:  Results for orders placed or performed during the hospital encounter of 03/19/17 (from the past 48 hour(s))  Comprehensive metabolic panel     Status: Abnormal   Collection Time: 03/19/17 11:35 AM  Result Value  Ref Range   Sodium 143 135 - 145 mmol/L   Potassium 3.0 (L) 3.5 - 5.1 mmol/L   Chloride 108 101 - 111 mmol/L   CO2 23 22 - 32 mmol/L   Glucose, Bld 92 65 - 99 mg/dL   BUN 28 (H) 6 - 20 mg/dL   Creatinine, Ser 0.79 0.44 - 1.00 mg/dL   Calcium 9.1 8.9 - 10.3 mg/dL   Total Protein 7.7 6.5 - 8.1 g/dL   Albumin 4.5 3.5 - 5.0 g/dL   AST 34 15 - 41 U/L   ALT 18 14 - 54 U/L   Alkaline Phosphatase 58 38 - 126 U/L   Total Bilirubin 1.0 0.3 - 1.2 mg/dL   GFR calc non Af Amer >60 >60 mL/min   GFR calc Af Amer >60 >60 mL/min    Comment: (NOTE) The eGFR has been calculated using the CKD EPI equation. This calculation has not been validated in all clinical situations. eGFR's persistently <60 mL/min signify possible Chronic Kidney Disease.    Anion gap 12 5 - 15    Comment: Performed at [facility], [address] [place], [place] [address]  CBC     Status: Abnormal   Collection Time: 03/19/17 11:35 AM  Result Value Ref Range   WBC 4.9 3.6 - 11.0 K/uL   RBC 3.29 (L) 3.80 - 5.20 MIL/uL   Hemoglobin 9.5 (L) 12.0 - 16.0 g/dL   HCT 29.8 (L) 35.0 - 47.0 %   MCV 90.6 80.0 - 100.0 fL   MCH 29.0 26.0 - 34.0 pg   MCHC 32.0 32.0 - 36.0 g/dL   RDW 15.0 (H) 11.5 - 14.5 %   Platelets 149 (L) 150 - 440 K/uL    Comment: Performed at [facility], [address] [place], [place] [address]  Acetaminophen level     Status: Abnormal   Collection Time: 03/19/17 11:35 AM  Result Value Ref Range   Acetaminophen (Tylenol), Serum <10 (L) 10 - 30 ug/mL    Comment:        THERAPEUTIC CONCENTRATIONS VARY SIGNIFICANTLY. A RANGE OF 10-30 ug/mL MAY BE AN EFFECTIVE CONCENTRATION FOR MANY PATIENTS. HOWEVER, SOME ARE BEST TREATED AT CONCENTRATIONS OUTSIDE THIS RANGE. ACETAMINOPHEN CONCENTRATIONS >150 ug/mL AT 4 HOURS AFTER INGESTION AND >50 ug/mL AT 12 HOURS AFTER INGESTION ARE OFTEN ASSOCIATED WITH TOXIC REACTIONS. Performed at [facility], [address] [place],  [place] [address]   Ethanol     Status: None   Collection Time: 03/19/17  11:35 AM  Result Value Ref Range   Alcohol, Ethyl (B) <10 <10 mg/dL    Comment:        LOWEST DETECTABLE LIMIT FOR SERUM ALCOHOL IS 10 mg/dL FOR MEDICAL PURPOSES ONLY Performed at [facility], [address] [place], [place] [address]   Salicylate level     Status: None   Collection Time: 03/19/17 11:35 AM  Result Value Ref Range   Salicylate Lvl <5.7 2.8 - 30.0 mg/dL    Comment: Performed at [facility], [address] [place], [place] [address]  Protime-INR     Status: None   Collection Time: 03/19/17 11:35 AM  Result Value Ref Range   Prothrombin Time 13.7 11.4 - 15.2 seconds   INR 1.06     Comment: Performed at [facility], [address] [place], [place] [address]  TSH     Status: None   Collection Time: 03/19/17 11:35 AM  Result Value Ref Range   TSH 4.269 0.350 - 4.500 uIU/mL    Comment: Performed by a 3rd Generation assay with a functional sensitivity of <=0.01 uIU/mL. Performed at [facility], [address] [place], [place] [address]   Urinalysis, Complete w Microscopic     Status: Abnormal   Collection Time: 03/19/17  5:03 PM  Result Value Ref Range   Color, Urine STRAW (A) YELLOW   APPearance CLEAR (A) CLEAR   Specific Gravity, Urine 1.010 1.005 - 1.030   pH 7.0 5.0 - 8.0   Glucose, UA NEGATIVE NEGATIVE mg/dL   Hgb urine dipstick NEGATIVE NEGATIVE   Bilirubin Urine NEGATIVE NEGATIVE   Ketones, ur 5 (A) NEGATIVE mg/dL   Protein, ur NEGATIVE NEGATIVE mg/dL   Nitrite NEGATIVE NEGATIVE   Leukocytes, UA NEGATIVE NEGATIVE   RBC / HPF 0-5 0 - 5 RBC/hpf   WBC, UA 0-5 0 - 5 WBC/hpf   Bacteria, UA NONE SEEN NONE SEEN   Squamous Epithelial / LPF 0-5 (A) NONE SEEN    Comment: Performed at [facility], [address] [place], [place] [address]    Current Facility-Administered Medications  Medication Dose Route Frequency Provider Last Rate Last  Dose  . cholecalciferol (VITAMIN D) tablet 400 Units  400 Units Oral Daily Tobiah Celestine T, MD      . furosemide (LASIX) tablet 20 mg  20 mg Oral Daily Isai Gottlieb T, MD      . loratadine (CLARITIN) tablet 10 mg  10 mg Oral Daily Necia Kamm T, MD      . pantoprazole (PROTONIX) EC tablet 40 mg  40 mg Oral QAC breakfast Camisha Srey T, MD      . risperiDONE (RISPERDAL M-TABS) disintegrating tablet 0.5 mg  0.5 mg Oral NOW Brendia Dampier T, MD      . risperiDONE (RISPERDAL M-TABS) disintegrating tablet 0.5 mg  0.5 mg Oral QHS Rea Kalama T, MD      . vitamin B-12 (CYANOCOBALAMIN) tablet 1,000 mcg  1,000 mcg Oral Daily Jourdon Zimmerle, Madie Reno, MD       Current Outpatient Medications  Medication Sig Dispense Refill  . acetaminophen (TYLENOL) 325 MG tablet Take 325 mg by mouth every 6 (six) hours as needed for moderate pain or headache.     . Cholecalciferol (VITAMIN D3 PO) Take 1 capsule by mouth daily.    Marland Kitchen Fexofenadine HCl (ALLEGRA ALLERGY CHILDRENS PO) Take 1 tablet by mouth daily.    . fluticasone (FLONASE) 50 MCG/ACT nasal spray Place into the  nose.    . furosemide (LASIX) 20 MG tablet Take 1 tablet (20 mg total) by mouth daily. (Patient not taking: Reported on 03/13/2017) 5 tablet 0  . loratadine (CLARITIN) 10 MG tablet Take by mouth.    Marland Kitchen LORazepam (ATIVAN) 0.5 MG tablet Take 1 tablet (0.5 mg total) by mouth 2 (two) times daily. (Patient not taking: Reported on 03/13/2017) 60 tablet 0  . omeprazole (PRILOSEC) 20 MG capsule Take 1 capsule (20 mg total) by mouth daily. (Patient not taking: Reported on 03/13/2017) 90 capsule 3  . vitamin B-12 (CYANOCOBALAMIN) 1000 MCG tablet Take 1,000 mcg by mouth daily.      Musculoskeletal: Strength & Muscle Tone: within normal limits Gait & Station: normal Patient leans: N/A  Psychiatric Specialty Exam: Physical Exam  Nursing note and vitals reviewed. Constitutional: She appears well-developed and well-nourished.  HENT:  Head: Normocephalic and  atraumatic.  Eyes: Conjunctivae are normal. Pupils are equal, round, and reactive to light.  Neck: Normal range of motion.  Cardiovascular: Regular rhythm and normal heart sounds.  Respiratory: Effort normal. No respiratory distress.  GI: Soft.  Musculoskeletal: Normal range of motion.  Neurological: She is alert.  Skin: Skin is warm and dry.  Psychiatric: Her speech is normal. Her mood appears anxious. Her affect is labile and inappropriate. Thought content is paranoid. Cognition and memory are impaired. She expresses impulsivity. She exhibits abnormal recent memory.    Review of Systems  Constitutional: Negative.   HENT: Negative.   Eyes: Negative.   Respiratory: Negative.   Cardiovascular: Negative.   Gastrointestinal: Negative.   Musculoskeletal: Negative.   Skin: Negative.   Neurological: Negative.   Psychiatric/Behavioral: Positive for hallucinations and memory loss. Negative for depression, substance abuse and suicidal ideas. The patient is nervous/anxious and has insomnia.     Blood pressure (!) 157/79, pulse (!) 103, temperature 98 F (36.7 C), temperature source Oral, resp. rate 18, height _0  (1.575 m), weight 49 kg (108 lb), SpO2 98 %.Body mass index is 19.75 kg/m.  General Appearance: Fairly Groomed  Eye Contact:  Fair  Speech:  Clear and Coherent  Volume:  Increased  Mood:  Anxious, Depressed and Irritable  Affect:  Congruent  Thought Process:  Disorganized  Orientation:  Full (Time, Place, and Person)  Thought Content:  Illogical and Paranoid Ideation  Suicidal Thoughts:  No  Homicidal Thoughts:  No  Memory:  Immediate;   Fair Recent;   Poor Remote;   Fair  Judgement:  Impaired  Insight:  Lacking  Psychomotor Activity:  Normal  Concentration:  Concentration: Poor  Recall:  [employer] of Knowledge:  Fair  Language:  Fair  Akathisia:  No  Handed:  Right  AIMS (if indicated):     Assets:  Desire for Improvement Housing Physical  Health Resilience Social Support  ADL's:  Impaired  Cognition:  Impaired,  Mild  Sleep:        Treatment Plan Summary: Daily contact with patient to assess and evaluate symptoms and progress in treatment, Medication management and Plan 84 year old woman with a history of probably mild dementia up until this point but who over the last few weeks has developed psychotic symptoms with agitation.  Differential diagnosis includes psychosis of dementia versus possibly a psychotic depression.  Possible delirium from medical problems although so far nothing found.  Workup proceeding.  Ultimately I think she will be best served by a geriatric psychiatry hospital.  I have ordered 1/2 mg of Risperdal at night in addition  to her usual medications.  We are still waiting to get a urinalysis back.  I assured the son that we will try to get appropriate treatment before thinking about sending her back home.  Case reviewed with TTS and ER physician.  Disposition: Recommend psychiatric Inpatient admission when medically cleared. Supportive therapy provided about ongoing stressors.  Alethia Berthold, MD 03/19/2017 5:25 PM

## 2017-03-19 NOTE — ED Notes (Signed)
Urine sent at this time.

## 2017-03-19 NOTE — ED Notes (Signed)
Pt attempting to climb out of end of bed. Pt seems agitated and frustrated, states she has to use the bathroom. Pt escorted to restroom and returned to bed. Pt states " I been here for four days, no one has checked on me, or gave me anything to eat. My kids are trying to kill me, not with guns or anything but in their own way. Somebody reported it and now they said I was here to keep me safe, but no one will tell me what is going on."

## 2017-03-19 NOTE — ED Notes (Signed)
Pt son and spouse is in family waiting room and wanted an update. Stated they couldn't visit the pt because of her being confused and thinking they are trying to kill her. This tech explained to family that pt is fine and in view of RN's and we are waiting on psychiatry to see pt and we would go from there. Pt informed this tech that they have been working with Dr. Judithann SheenSparks to try and find help and a place for pt and today Dr. Judithann SheenSparks told them to bring her to the ED. Pt son stated he was not able to take care of pt any longer and needs a place for her to go. Family does not want pt released back in their care.  If they are not here would like to have call when pt is seen. Deveron FurlongRalph Waller [phone]    Annia BeltKathy Brooks [phone]

## 2017-03-19 NOTE — ED Triage Notes (Addendum)
When asked what brings pt up here she states "Do I really have to say?" pt states she had heart valve surgery 1 month ago at [facility]. Pt states someone was trying to kill her last night. Pt states "I don't usually shake like this until I get mad." pt slightly shaking during triage. Pt talking about "walking dog shoes." and points to white shoes she is currently wearing. Pt talks about dog a lot during triage.  Pt is HOH, has hearing aids in both ears.   Denies pain.   Brought up here by [facility]. Does not answer question about where pt lives. Reports hx schizophrenia. "I live in the seniors apartment." states number is 22. States she has lived there 10 years or so.   When asked if pt hearing voices she states "they've been trying to kill me for quite a while now." when asked about hallucinations pt states "I got this notice from the vet ([facility] Dogs name on card is Gracie. Pt states "he didn't take her and get her toenails cut." pt states the "he" she is talking about is her son.   Pt states she is allergic to all antibiotics but only penicillin listed in chart. Pt is not able to tell this RN which antibiotics she is allergic to. Unable to answer what she takes when she gets sick.  Pt goes from talking about heart valve replacement back to dog and back. Pt unsure if bovine or metal valve.   Pt in wheelchair.

## 2017-03-19 NOTE — ED Notes (Signed)
Pt dressed out at this time, jeans, shirt, bra, underwear, white shoes and 3 rings in urine cup all secured at this time. PT has 2 hearing aids and glasses on at this time

## 2017-03-19 NOTE — BH Assessment (Signed)
Patient Referral Information Faxed to:  [facility]   [address] [place] [address] Phone: [phone] Fax: [phone] [facility]    [address] [place] [address] Phone: [phone] Fax: [phone] [facility] [address], [place] [address] Phone: [phone] Fax: [phone] [facility]   [address] [place] [address] Phone: [phone] Fax: [phone] [facility]   [address], [place] [address] phone: [phone] Fax: [phone] [facility]   [address] [place] [address] Phone: [phone] Fax: [phone] [facility] [address] [place] [address] Phone: [phone] Fax: [phone] [facility]   [address] [place] [address] Phone: [phone] Fax: [phone] [facility]   [address] [place] [address] Phone: [phone] Fax: [phone]

## 2017-03-19 NOTE — ED Notes (Addendum)
Pt has black purse, black/blue patterned jacket, blue jeans, white keds. Printed purple shirt, glasses. Rings on L hand.

## 2017-03-19 NOTE — ED Provider Notes (Signed)
-----------------------------------------   6:44 PM on 03/19/2017 -----------------------------------------  I received signout on this patient from Dr. Alphonzo LemmingsMcShane.  The patient was pending UA and TSH, both of which are within normal limits.  She has been evaluated by Dr. Toni Amendlapacs from psychiatry who discussed the case with me.  He believes [facility]  The patient is clinically stable at this time and will board in the ED pending placement.   Dionne BucySiadecki, Peniel Biel, MD 03/19/17 [phone]

## 2017-03-19 NOTE — BH Assessment (Signed)
Pt had altered mental status at time of assessment. Unable to collect information due to many disorganized thoughts, flight of ideas, and persecutory beliefs. Writer asked if she could contact two adult children for collateral information, however pt refused to provide consent.

## 2017-03-19 NOTE — ED Notes (Signed)
Pt given sandwich tray at this time  

## 2017-03-19 NOTE — Care Management (Signed)
This RNCM received call from [facility] care team stating concern that patient may need higher level of care. Patient has presented to hospital multiple times over last 6 months. Psych consult pending.

## 2017-03-19 NOTE — ED Notes (Signed)
MD Clapacs at bedside  
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
## 2017-03-20 ENCOUNTER — Ambulatory Visit: Admission: RE | Admit: 2017-03-20 | Payer: Medicare Other | Source: Ambulatory Visit

## 2017-03-20 DIAGNOSIS — F0391 Unspecified dementia with behavioral disturbance: Secondary | ICD-10-CM | POA: Diagnosis not present

## 2017-03-20 NOTE — BH Assessment (Signed)
This Clinical research associatewriter called patient's daughter and informed her per Dr. Shary Keylapac's, patient should still be in the ED in the next 24 hours.

## 2017-03-20 NOTE — ED Notes (Addendum)
Annia BeltKathy Brooks (daughter) called.  She states that is working on finding placement for her mother.  She can be contacted at [phone].

## 2017-03-20 NOTE — ED Notes (Signed)
She has ambulated to and from the BR with stand by assistance

## 2017-03-20 NOTE — ED Notes (Signed)
BEHAVIORAL HEALTH ROUNDING Patient sleeping: No. Patient alert and oriented: yes Behavior appropriate: Yes.  ; If no, describe:  Nutrition and fluids offered: yes Toileting and hygiene offered: Yes  Sitter present: q15 minute observations and security  monitoring Law enforcement present: Yes  ODS  

## 2017-03-20 NOTE — BH Assessment (Signed)
This patient received called from patient's daughter Annia BeltKathy Brooks at [phone], asking if patient will be [facility] for the next 24 hours due to trying to secure legal guardianship and the location of patient is a requirement for the legal guardianship process. Writer informed [name], patient hasn't secured a bed at another hospital for gero psych, but would confirm with Dr. Toni Amendlapacs on patient's disposition and return call.

## 2017-03-20 NOTE — BH Assessment (Signed)
This Clinical research associatewriter received a call from patients daugher Annia BeltKathy Brooks stating in the next 2 a court appointed attorney will be coming [facility] to present legal guardianship papers for patient. [name] stated she and her brother Deveron FurlongRalph Waller will have joint legal guardianship on patient.

## 2017-03-20 NOTE — ED Notes (Signed)

## 2017-03-20 NOTE — BH Assessment (Signed)
This Clinical research associatewriter returned call to patients daughter Annia BeltKathy Brooks at [phone] and updated her [facility]. Daughter stated they are working on getting legal guardianship of patient and are working on finding memory care placement for patient upon discharge from the hospital. Informed daughter patient will be re-evaluated by psychiatrist and will update her on patient disposition.

## 2017-03-20 NOTE — ED Notes (Signed)
Up and walked to the BR, gait steady. Pt is appropriate

## 2017-03-20 NOTE — BH Assessment (Signed)
Re[facility]ed [facility].

## 2017-03-20 NOTE — ED Notes (Signed)
ED  Is the patient under IVC or is there intent for IVC: Yes.   Is the patient medically cleared: Yes.   Is there vacancy in the ED BHU: Yes.   Is the population mix appropriate for patient: geriatric - high fall risk Is the patient awaiting placement in inpatient or outpatient setting: awaiting geripsych placement  Has the patient had a psychiatric consult: Yes.   Survey of unit performed for contraband, proper placement and condition of furniture, tampering with fixtures in bathroom, shower, and each patient room: Yes.  ; Findings:  APPEARANCE/BEHAVIOR Calm and cooperative NEURO ASSESSMENT Orientation: oriented to self, place  Denies pain Hallucinations: No.None noted (Hallucinations) Speech: Normal Gait: normal - stand by assistance provided  RESPIRATORY ASSESSMENT Even  Unlabored respirations  CARDIOVASCULAR ASSESSMENT Pulses equal   regular rate  Skin warm and dry   GASTROINTESTINAL ASSESSMENT no GI complaint EXTREMITIES Full ROM  PLAN OF CARE Provide calm/safe environment. Vital signs assessed twice daily. ED BHU Assessment once each 12-hour shift. Collaborate with TTS when available or as condition indicates. Assure the ED provider has rounded once each shift. Provide and encourage hygiene. Provide redirection as needed. Assess for escalating behavior; address immediately and inform ED provider.  Assess family dynamic and appropriateness for visitation as needed: Yes.  ; If necessary, describe findings:  Educate the patient/family about BHU procedures/visitation: Yes.  ; If necessary, describe findings:

## 2017-03-20 NOTE — ED Notes (Signed)
BEHAVIORAL HEALTH ROUNDING Patient sleeping: Yes.   Patient alert and oriented: eyes closed  Appears to be asleep Behavior appropriate: Yes.  ; If no, describe:  Nutrition and fluids offered: Yes  Toileting and hygiene offered: sleeping Sitter present: q 15 minute observations and security monitoring Law enforcement present: yes  ODS 

## 2017-03-20 NOTE — ED Notes (Signed)
Patient observed lying in hallway bed with eyes closed  Even, unlabored respirations observed   NAD pt appears to be sleeping  I will continue to monitor along with every 15 minute visual observations and ongoing security monitoring    

## 2017-03-20 NOTE — ED Notes (Signed)
IVC / Consult completed/ Pending Placement 

## 2017-03-20 NOTE — BH Assessment (Signed)
  Patient Referral Information Faxed to: . [facility] - Per Jamesetta SoPhyllis, referral received, and under review.                  [facility] Marrow. [facility]- Per Christiane HaJonathan, patient denied due to dementia.   . [facility]- Per Bonita QuinLinda, no beds available.   Jhonnie Garner. [facility]- Unable to speak with anyone             . [facility]-  Per Cedric, referral not received.          [facility]. [facility]- Per Willis WharfShannon, no bed availability.                Doretha Imus. [facility]- Per Misty StanleyLisa, at capacity.   Mora Appl. [facility]- Unable to speak with anyone.

## 2017-03-21 ENCOUNTER — Encounter: Payer: Self-pay | Admitting: Thoracic Surgery (Cardiothoracic Vascular Surgery)

## 2017-03-21 ENCOUNTER — Emergency Department: Payer: Medicare Other

## 2017-03-21 ENCOUNTER — Other Ambulatory Visit: Payer: Self-pay

## 2017-03-21 DIAGNOSIS — F0391 Unspecified dementia with behavioral disturbance: Secondary | ICD-10-CM | POA: Diagnosis not present

## 2017-03-21 NOTE — ED Provider Notes (Signed)
went to see the patient briefly she is awake alert and happy cheerful putting a puzzle together eyes look normal mouth is normal heart regular rate and rhythm no audible murmurs lungs are clear abdomen soft and nontender she has a small scab on the back of the right leg which she's worried about but it looks like it's healing well and quite old and actually there is no edema in her legs patient looks well physically.   Arnaldo NatalMalinda, Paul F, MD 03/21/17 2016

## 2017-03-21 NOTE — BH Assessment (Signed)
Writer spoke with [facility] and they are reviewing patient. They are requesting EKG and Chest X-Ray. Writer informed patient's nurse Darl Pikes(Susan) and ER MD (Dr. Darnelle CatalanMalinda).

## 2017-03-21 NOTE — ED Notes (Signed)
Pt moved into room 26. Cooperative and calm.

## 2017-03-21 NOTE — ED Provider Notes (Signed)
Vitals:   03/20/17 2254 03/21/17 0600  BP: 135/69 134/80  Pulse: 78 62  Resp:  16  Temp: 98 F (36.7 C) 97.8 F (36.6 C)  SpO2: 99% 99%   No acute events reported to me overnight by nursing or physician report.  Review of TTS documentation, patient is currently under involuntary commitment with paranoia and [facility] for likely [facility] disposition.     Governor RooksLord, Cordell Guercio, MD 03/21/17 [id number]

## 2017-03-21 NOTE — BH Assessment (Signed)
Writer received phone call from patient's daughter Olegario Messier(Kathy [phone]) asking about updates. Writer informed her the patient was still in the ER and currently seeking Geriatric Psych Placement.

## 2017-03-21 NOTE — ED Notes (Signed)
Pt did not eat the hot dog or orange in her lunch - offered a Malawiturkey tray, pt accepted graham crackers and peanut butter.

## 2017-03-21 NOTE — BH Assessment (Signed)
Referral information faxed to;   [facility] [phone])   [facility] [phone])   [facility] [phone])

## 2017-03-21 NOTE — ED Provider Notes (Addendum)
EKG read and interpreted by me shows normal sinus rhythm rate of 96 normal axis left bundle-branch block EKG is similar to one from several months ago. There also are some PVCs.right-sided EKG was done and does not show any MI.   Arnaldo NatalMalinda, Paul F, MD 03/21/17 1752    Arnaldo NatalMalinda, Paul F, MD 03/21/17 1754

## 2017-03-21 NOTE — ED Notes (Signed)
Report received, care assumed , pt resting with eyes closed in a hallway stretcher. Even unlabored respiration, no distressed noted.

## 2017-03-21 NOTE — ED Notes (Signed)
Pt very upset since being served with competency hearing papers from the sheriff. Pt keeps reading it and getting more and more upset. Pt attempted to walk out to go hire a Clinical research associatelawyer. Sitter ordered Printmakerand officer in with pt.

## 2017-03-21 NOTE — ED Notes (Signed)
Pt much calmer after tech Sunny SchleinFelicia was able to take the court papers away from pt and place them in the chart. Sitter no longer needed.

## 2017-03-21 NOTE — ED Notes (Signed)
PT IVC/ PENDING PLACEMENT  

## 2017-03-22 ENCOUNTER — Ambulatory Visit (HOSPITAL_COMMUNITY): Payer: Medicare Other

## 2017-03-22 ENCOUNTER — Encounter (HOSPITAL_COMMUNITY): Payer: Self-pay

## 2017-03-22 DIAGNOSIS — F0391 Unspecified dementia with behavioral disturbance: Secondary | ICD-10-CM | POA: Diagnosis not present

## 2017-03-22 MED ORDER — POTASSIUM CHLORIDE 20 MEQ PO PACK
40.0000 meq | PACK | Freq: Once | ORAL | Status: AC
  Administered 2017-03-22: 40 meq via ORAL
  Filled 2017-03-22: qty 2

## 2017-03-22 NOTE — ED Notes (Signed)
Lunch tray provided. 
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
## 2017-03-22 NOTE — ED Notes (Signed)
[facility]. Pt noted to be in stable condition at this time.

## 2017-03-22 NOTE — ED Notes (Signed)
PT IVC/ACCEPTED TO [place]/TRANSPORTATION CALLED.

## 2017-03-22 NOTE — ED Notes (Signed)
Patient assisted to toilet, steady gait noted.

## 2017-03-22 NOTE — BH Assessment (Signed)
[facility] has been accepted [facility] Patient assigned to Psych Geriatric Unit Accepting physician is Dr. Joseph ArtSubedi. Call report to [phone]. Representative was [employer].   ER Staff is aware of it:  Emilie, ER Sect.;  Dorene SorrowJerry, Patient's Nurse     Patient's Family/Support System Rayna Sexton(Ralph [phone]) have been updated as well.  Bed will be available after 12pm.

## 2017-03-22 NOTE — ED Notes (Signed)
Breakfast meal tray provided.

## 2017-03-28 DIAGNOSIS — I447 Left bundle-branch block, unspecified: Secondary | ICD-10-CM | POA: Insufficient documentation

## 2017-03-28 DIAGNOSIS — R9431 Abnormal electrocardiogram [ECG] [EKG]: Secondary | ICD-10-CM | POA: Insufficient documentation

## 2017-03-31 DIAGNOSIS — E559 Vitamin D deficiency, unspecified: Secondary | ICD-10-CM | POA: Insufficient documentation

## 2017-03-31 DIAGNOSIS — J310 Chronic rhinitis: Secondary | ICD-10-CM | POA: Insufficient documentation

## 2017-03-31 DIAGNOSIS — E538 Deficiency of other specified B group vitamins: Secondary | ICD-10-CM | POA: Insufficient documentation

## 2017-04-05 ENCOUNTER — Telehealth: Payer: Self-pay | Admitting: Gastroenterology

## 2017-04-05 NOTE — Telephone Encounter (Signed)
Pt daughter left vm to speak to Dr. Tobi BastosAnna or nurse pt has been in psyshyatic  clinic and they have been giving her blood she would like to give some information about pt to Dr. Tobi BastosAnna [facility] # [phone]

## 2017-04-05 NOTE — Telephone Encounter (Signed)
Panya please look into it   IranKiran

## 2017-04-08 NOTE — Telephone Encounter (Signed)
Kelsey Le   Lets check on her a week prior to her appointment. If she is in the hospital still then can cancel outpatient. If she is home then all depends on her Hb - if stable can hold off on appointment but if dropping may need to speak with family   Dr Wyline MoodKiran Sahar Ryback MD,MRCP [facility] Gastroenterology/Hepatology Pager: [phone]   C/c Sparks, Duane LopeJeffrey D, MD

## 2017-04-30 ENCOUNTER — Encounter: Payer: Self-pay | Admitting: Gastroenterology

## 2017-04-30 ENCOUNTER — Ambulatory Visit (INDEPENDENT_AMBULATORY_CARE_PROVIDER_SITE_OTHER): Payer: Medicare Other | Admitting: Gastroenterology

## 2017-04-30 VITALS — BP 180/65 | HR 60 | Ht 62.0 in | Wt 113.8 lb

## 2017-04-30 DIAGNOSIS — K767 Hepatorenal syndrome: Secondary | ICD-10-CM | POA: Diagnosis not present

## 2017-04-30 DIAGNOSIS — D500 Iron deficiency anemia secondary to blood loss (chronic): Secondary | ICD-10-CM | POA: Diagnosis not present

## 2017-04-30 NOTE — Progress Notes (Signed)
[name] MD, MRCP(U.K) [address]  [address]  [place], [place] [address]  Main: [phone]  Fax: [phone]   Primary Care Physician: Marguarite Arbour, MD  Primary Gastroenterologist:  Dr. [name]   Chief Complaint  Patient presents with  . Follow-up    HPI: Kelsey Le is a 84 y.o. female    Summary of history :  She is here today to see me for a hospital follow up. I was consulted to see her when she was admitted in 12/2016 .has a history of iron deficiency anemia attributed to chronic GI blood loss. She does have severe aortic stenosis. She has had on and off melena for a long time. She is followed by hematology in terms of her anemia. She sees Dr. Mechele Collin as an outpatient. Last office visit was in February 2018. She has had multiple upper endoscopies over the years. Colonoscopy last performed in 09/20/2013 at [facility] for iron deficiency anemia was normal. 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 She recently underwent an upper endoscopy on 10/12/2016 by Dr. Charolotte Capuchin and she presented with melena and he noted diffuse mild inflammation found in the stomach examined portion of the duodenum was normal. She underwent a capsule study in May 2018 with Dr. Mechele Collin which showed AVMs in the small intestine time the proximal portion of small intestine approximately 51 minutes into the procedure. Multiple was seen for the next 20-30 minutes it is not clear subsequently what the subsequent plan was in terms of management of these AVMs I do not see any further office notes with [facility] to indicate a clear plan of action with regards to management of the AVMs. In the interim shewastreated for H. pylori by Dr. Maryan Puls was admitted on 12/21/2016 when she presented to the emergency room with melena of 4 days duration and generalized weakness. Dr. Judithann Sheen ordered a CBC which demonstrated a  hemoglobin of 6.7 andwas sent tothe ER for further evaluation.Shehas had on and off melena for "years " per patient. 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  Interval history3/7/19 -04/30/17  After last visit was admitted for anxiety and related issues.   CBC Latest Ref Rng & Units 03/19/2017 03/06/2017 03/04/2017  WBC 3.6 - 11.0 K/uL 4.9 5.3 5.2  Hemoglobin 12.0 - 16.0 g/dL 1.3(Y) 8.6(V) 8.9(L)  Hematocrit 35.0 - 47.0 % 29.8(L) 27.5(L) 26.5(L)  Platelets 150 - 440 K/uL 149(L) 219 244     HB 04/04/17 9.7 grams  Hb 04/17/17 9.7 grams   Normal stool color. Has had issues with allergies.    Current Outpatient Medications  Medication Sig Dispense Refill  . acetaminophen (TYLENOL) 325 MG tablet Take 325 mg by mouth every 6 (six) hours as needed for moderate pain or headache.     . Cholecalciferol (VITAMIN D3 PO) Take 1 capsule by mouth daily.    Marland Kitchen donepezil (ARICEPT) 5 MG tablet   0  . Fexofenadine HCl (ALLEGRA ALLERGY CHILDRENS PO) Take 1 tablet by mouth daily.    . fluticasone (FLONASE) 50 MCG/ACT nasal spray Place into the nose.    . iron polysaccharides (NU-IRON) 150 MG capsule Take by mouth.    . loratadine (CLARITIN) 10 MG tablet Take  by mouth.    Marland Kitchen LORazepam (ATIVAN) 0.5 MG tablet Take 1 tablet (0.5 mg total) by mouth 2 (two) times daily. 60 tablet 0  . omeprazole (PRILOSEC) 20 MG capsule Take 1 capsule (20 mg total) by mouth daily. 90 capsule 3  . predniSONE (DELTASONE) 10 MG tablet Taper 6-5-4-3-2-1-off    . risperiDONE (RISPERDAL) 0.5 MG tablet   0  . sertraline (ZOLOFT) 25 MG tablet   0  . vitamin B-12 (CYANOCOBALAMIN) 1000 MCG tablet Take 1,000 mcg by mouth daily.    . furosemide (LASIX) 20 MG tablet  Take 1 tablet (20 mg total) by mouth daily. (Patient not taking: Reported on 04/30/2017) 5 tablet 0   No current facility-administered medications for this visit.     Allergies as of 04/30/2017 - Review Complete 04/30/2017  Allergen Reaction Noted  . Aspirin Other (See Comments) 10/15/2016  . Penicillins Rash and Other (See Comments) 04/24/2013    ROS:  General: Negative for anorexia, weight loss, fever, chills, fatigue, weakness. ENT: Negative for hoarseness, difficulty swallowing , nasal congestion. CV: Negative for chest pain, angina, palpitations, dyspnea on exertion, peripheral edema.  Respiratory: Negative for dyspnea at rest, dyspnea on exertion, cough, sputum, wheezing.  GI: See history of present illness. GU:  Negative for dysuria, hematuria, urinary incontinence, urinary frequency, nocturnal urination.  Endo: Negative for unusual weight change.    Physical Examination:   BP (!) 180/65 (BP Location: Right Arm, Patient Position: Sitting, Cuff Size: Normal)   Pulse 60   Ht  (1.575 m)   Wt 113 lb 12.8 oz (51.6 kg)   BMI 20.81 kg/m   General: Well-nourished, well-developed in no acute distress.  Eyes: No icterus. Conjunctivae pink. Mouth: Oropharyngeal mucosa moist and pink , no lesions erythema or exudate. Lungs: Clear to auscultation bilaterally. Non-labored. Heart: Regular rate and rhythm,systolic murmur in aortic area no rubs or gallops.  Abdomen: Bowel sounds are normal, nontender, nondistended, no hepatosplenomegaly or masses, no abdominal bruits or hernia , no rebound or guarding.   Extremities: No lower extremity edema. No clubbing or deformities. Neuro: Alert and oriented x 3.  Grossly intact. Skin: Warm and dry, no jaundice.   Psych: Alert and cooperative, normal mood and affect.   Imaging Studies: No results found.  Assessment and Plan:   Kelsey Le is a 84 y.o. y/o female S/p TAVR on 02/12/17 for severe aortic stenosis, long standing iron  deficiency anemia , known to have had small bowel AVm's in the past , not undergone any prior GI therapy such as ablation for unclear reasons. My impression has been  that the AVM's are secondary to the Aortic stenosis,Hb stable since TAVR . Brown stool presently.     1. Recheck CBC if stable does not require any further GI evaluation - if has further drop in Hb then would need to consider enteroscopy at [facility] for small bowel AVM ablation.  2. If  Her Hb is stable she can follow up as needed. She has no signs of bleeding and is presently in a nursing home and CBC has been monitored and been stable.    Dr [name]  MD,MRCP [facility]) Follow up PRN

## 2017-06-06 ENCOUNTER — Ambulatory Visit (INDEPENDENT_AMBULATORY_CARE_PROVIDER_SITE_OTHER): Payer: Medicare Other | Admitting: Cardiovascular Disease

## 2017-06-06 ENCOUNTER — Encounter: Payer: Self-pay | Admitting: Cardiovascular Disease

## 2017-06-06 VITALS — BP 142/60 | HR 70 | Ht 63.0 in | Wt 117.0 lb

## 2017-06-06 DIAGNOSIS — Z953 Presence of xenogenic heart valve: Secondary | ICD-10-CM

## 2017-06-06 NOTE — Progress Notes (Signed)
Cardiology Office Note   Date:  06/06/2017   ID:  KARTER HAIRE, DOB [birth year], MRN [id number]  PCP:  Living, [facility]amant[facility] Monks Assisted  Cardiologist:   Lorine Bears, MD   Chief Complaint  Patient presents with  . Other    3 month follow up per dr Clifton James. Patient c/o Swelling in ankles and SOB. Meds reviewed verbally with patient.       History of Present Illness: Kelsey Le is a 84 y.o. female who presents for  a follow-up visit regarding  aortic stenosis. A nuclear stress test in May 2015 showed no evidence of ischemia. The patient has known history of recurrent GI bleeding due to AVMs and iron deficiency anemia. She has chronic back pain and has underwent kyphoplasty in the past.  She also has known history of anxiety with mild memory problems.  She is status post TAVR in February 2019 via the transfemoral approach.  She had small pericardial effusion that did not require intervention.  Postoperative echo showed normal LV systolic function with normal function of bioprosthetic aortic valve with a mean gradient of 14 mmHg.  Pericardial effusion was small and stable.  She had recent problems with psychosis and worsening memory.  However, this improved with medications. She has been doing reasonably well with no recent chest pain or worsening dyspnea.  She continues to have issues with anemia and hemoglobin has been around 9.    Past Medical History:  Diagnosis Date  . Anxiety   . Asthma   . AVM (arteriovenous malformation)    a. small intestine with recurrent GI bleeding.   . Chronic diastolic CHF (congestive heart failure) (HCC)   . Hip fracture (HCC)    a. in the setting of mechanical fall  . Iron deficiency anemia    a. requiring periodic transfusions  . S/P TAVR (transcatheter aortic valve replacement)    a. 02/12/17: s/p TAVR w/ an Edwards Sapien 3 THV (size 23 mm, model # 9600TFX, serial # [id number])  . Severe aortic stenosis   . Syncope and collapse     Past  Surgical History:  Procedure Laterality Date  . APPENDECTOMY    . BACK SURGERY     back fusion  . CARDIAC CATHETERIZATION    . ESOPHAGOGASTRODUODENOSCOPY (EGD) WITH PROPOFOL N/A 11/15/2015   Procedure: ESOPHAGOGASTRODUODENOSCOPY (EGD) WITH PROPOFOL;  Surgeon: [name], MD;  Location: [facility] ENDOSCOPY;  Service: Endoscopy;  Laterality: N/A;  . ESOPHAGOGASTRODUODENOSCOPY (EGD) WITH PROPOFOL N/A 10/12/2016   Procedure: ESOPHAGOGASTRODUODENOSCOPY (EGD) WITH PROPOFOL;  Surgeon: [name], MD;  Location: [facility] ENDOSCOPY;  Service: Endoscopy;  Laterality: N/A;  . FEMUR SURGERY     broken with rod  . FRACTURE SURGERY    . GIVENS CAPSULE STUDY N/A 02/20/2016   Procedure: GIVENS CAPSULE STUDY;  Surgeon: Scot Jun, MD;  Location: [facility] ENDOSCOPY;  Service: Endoscopy;  Laterality: N/A;  . HIP FRACTURE SURGERY    . INTRAMEDULLARY (IM) NAIL INTERTROCHANTERIC Right 11/18/2015   Procedure: INTRAMEDULLARY (IM) NAIL INTERTROCHANTRIC;  Surgeon: Juanell Fairly, MD;  Location: [facility] ORS;  Service: Orthopedics;  Laterality: Right;  . KYPHOPLASTY N/A 08/19/2014   Procedure: KYPHOPLASTY;  Surgeon: Kennedy Bucker, MD;  Location: [facility] ORS;  Service: Orthopedics;  Laterality: N/A;  . PARTIAL HYSTERECTOMY    . RIGHT/LEFT HEART CATH AND CORONARY ANGIOGRAPHY N/A 10/05/2016   Procedure: RIGHT/LEFT HEART CATH AND CORONARY ANGIOGRAPHY;  Surgeon: Kathleene Hazel, MD;  [facility] INVASIVE CV LAB;  Service: Cardiovascular;  Laterality: N/A;  .  TEE WITHOUT CARDIOVERSION N/A 02/12/2017   Procedure: TRANSESOPHAGEAL ECHOCARDIOGRAM (TEE);  Surgeon: Kathleene HazelMcAlhany, Christopher D, MD;  [facility];  Service: Open Heart Surgery;  Laterality: N/A;  . TRANSCATHETER AORTIC VALVE REPLACEMENT, TRANSFEMORAL N/A 02/12/2017   Procedure: TRANSCATHETER AORTIC VALVE REPLACEMENT, TRANSFEMORAL using a 23mm Edwards Sapien 3 Aortic Valve;  Surgeon: Kathleene HazelMcAlhany, Christopher D, MD;  Location: [facility] OR;  Service: Open Heart Surgery;  Laterality:  N/A;  . TUBAL LIGATION       Current Outpatient Medications  Medication Sig Dispense Refill  . acetaminophen (TYLENOL) 325 MG tablet Take 325 mg by mouth every 6 (six) hours as needed for moderate pain or headache.     . Cholecalciferol (VITAMIN D3 PO) Take 1 capsule by mouth daily.    . furosemide (LASIX) 20 MG tablet Take 1 tablet (20 mg total) by mouth daily. 5 tablet 0  . iron polysaccharides (NIFEREX) 150 MG capsule Take 150 mg by mouth daily.    Marland Kitchen. levothyroxine (SYNTHROID, LEVOTHROID) 75 MCG tablet Take 75 mcg by mouth daily before breakfast.    . loratadine (CLARITIN) 10 MG tablet Take by mouth.    Marland Kitchen. omeprazole (PRILOSEC) 20 MG capsule Take 1 capsule (20 mg total) by mouth daily. 90 capsule 3  . risperiDONE (RISPERDAL) 0.5 MG tablet   0  . sertraline (ZOLOFT) 25 MG tablet   0  . vitamin B-12 (CYANOCOBALAMIN) 1000 MCG tablet Take 1,000 mcg by mouth daily.     No current facility-administered medications for this visit.     Allergies:   Aspirin and Penicillins    Social History:  The patient  reports that she has never smoked. She has never used smokeless tobacco. She reports that she does not drink alcohol or use drugs.   Family History:  The patient's family history includes Breast cancer in her maternal aunt; Heart block in her mother; Heart disease in her sister; Heart failure in her father.    ROS:  Please see the history of present illness.   Otherwise, review of systems are positive for none.   All other systems are reviewed and negative.    PHYSICAL EXAM: VS:  BP (!) 142/60 (BP Location: Right Arm, Patient Position: Sitting, Cuff Size: Normal)   Pulse 70   Ht 5\' 3"  (1.6 m)   Wt 117 lb (53.1 kg)   BMI 20.73 kg/m  , BMI Body mass index is 20.73 kg/m. GEN: Well nourished, well developed, in no acute distress  HEENT: normal  Neck: no JVD, or masses.  No carotid bruits Cardiac: RRR;  rubs, or gallops,no edema .  2 out of 6 systolic murmur in the aortic  area Respiratory:  clear to auscultation bilaterally, normal work of breathing GI: soft, nontender, nondistended, + BS MS: no deformity or atrophy  Skin: warm and dry, no rash Neuro:  Strength and sensation are intact Psych: euthymic mood, full affect   EKG:  EKG is  ordered today. EKG showed normal sinus rhythm with PVCs and left bundle branch block.  Recent Labs: 02/06/2017: B Natriuretic Peptide 351.7 02/13/2017: Magnesium 1.8 02/20/2017: NT-Pro BNP 1,703 03/19/2017: ALT 18; BUN 28; Creatinine, Ser 0.79; Hemoglobin 9.5; Platelets 149; Potassium 3.0; Sodium 143; TSH 4.269    Lipid Panel No results found for: CHOL, TRIG, HDL, CHOLHDL, VLDL, LDLCALC, LDLDIRECT    Wt Readings from Last 3 Encounters:  06/06/17 117 lb (53.1 kg)  04/30/17 113 lb 12.8 oz (51.6 kg)  03/19/17 108 lb (49 kg)  ASSESSMENT AND PLAN:   1.  Status post TAVR for severe aortic stenosis: Considering her age and comorbidities, she is doing reasonably well.  Most recent echocardiogram showed normal functioning bioprosthetic valve.    2.  blood loss anemia: Hemoglobin has been stable around 9.   Disposition: Follow-up with me in 3 months.  Signed,  Lorine Bears, MD  06/06/2017 4:34 PM    [facility]

## 2017-06-06 NOTE — Patient Instructions (Addendum)
Medication Instructions:  Your physician recommends that you continue on your current medications as directed. Please refer to the Current Medication list given to you today.   Labwork: None ordered  Testing/Procedures: None oerderd  Follow-Up: Your physician recommends that you schedule a follow-up appointment in: 3 months with Dr.Arida  Any Other Special Instructions Will Be Listed Below (If Applicable).     If you need a refill on your cardiac medications before your next appointment, please call your pharmacy.

## 2017-09-12 ENCOUNTER — Encounter: Payer: Self-pay | Admitting: Cardiovascular Disease

## 2017-09-12 ENCOUNTER — Ambulatory Visit (INDEPENDENT_AMBULATORY_CARE_PROVIDER_SITE_OTHER): Payer: Medicare Other | Admitting: Cardiovascular Disease

## 2017-09-12 VITALS — BP 140/50 | HR 61 | Ht 63.0 in | Wt 125.2 lb

## 2017-09-12 DIAGNOSIS — Z953 Presence of xenogenic heart valve: Secondary | ICD-10-CM

## 2017-09-12 NOTE — Patient Instructions (Signed)
Medication Instructions: Continue same medications.   Labwork: None.   Procedures/Testing: None.   Follow-Up: 4 months with Dr. Arida.   Any Additional Special Instructions Will Be Listed Below (If Applicable).     If you need a refill on your cardiac medications before your next appointment, please call your pharmacy.   

## 2017-09-12 NOTE — Progress Notes (Signed)
Cardiology Office Note   Date:  09/12/2017   ID:  Kelsey Le, DOB 1930-12-12, MRN [id number]  PCP:  Living, [facility] Monks Assisted  Cardiologist:   Lorine Bears, MD   Chief Complaint  Patient presents with  . other    3 month follow up. Meds reviewed by the pt.'s med list. Pt. c/o twinges in chest and occas. shortness of breath with over exertion.       History of Present Illness: Kelsey Le is a 84 y.o. female who presents for  a follow-up visit regarding  aortic stenosis. A nuclear stress test in May 2015 showed no evidence of ischemia. The patient has known history of recurrent GI bleeding due to AVMs and iron deficiency anemia. She has chronic back pain and has underwent kyphoplasty in the past.  She also has known history of anxiety with mild memory problems.  She is status post TAVR in February 2019 via the transfemoral approach.  She had small pericardial effusion that did not require intervention.  Postoperative echo showed normal LV systolic function with normal function of bioprosthetic aortic valve with a mean gradient of 14 mmHg.  Pericardial effusion was small and stable.    She has been staying at [facility] and has been doing very well with mild exertional dyspnea.  She has been participating in the exercise class almost daily.  Her appetite has improved and she has gained some weight.  Past Medical History:  Diagnosis Date  . Anxiety   . Asthma   . AVM (arteriovenous malformation)    a. small intestine with recurrent GI bleeding.   . Chronic diastolic CHF (congestive heart failure) (HCC)   . Hip fracture (HCC)    a. in the setting of mechanical fall  . Iron deficiency anemia    a. requiring periodic transfusions  . S/P TAVR (transcatheter aortic valve replacement)    a. 02/12/17: s/p TAVR w/ an Edwards Sapien 3 THV (size 23 mm, model # 9600TFX, serial # [id number])  . Severe aortic stenosis   . Syncope and collapse   . Thyroid disease     Past  Surgical History:  Procedure Laterality Date  . APPENDECTOMY    . BACK SURGERY     back fusion  . CARDIAC CATHETERIZATION    . ESOPHAGOGASTRODUODENOSCOPY (EGD) WITH PROPOFOL N/A 11/15/2015   Procedure: ESOPHAGOGASTRODUODENOSCOPY (EGD) WITH PROPOFOL;  Surgeon: [name], MD;  Location: [facility] ENDOSCOPY;  Service: Endoscopy;  Laterality: N/A;  . ESOPHAGOGASTRODUODENOSCOPY (EGD) WITH PROPOFOL N/A 10/12/2016   Procedure: ESOPHAGOGASTRODUODENOSCOPY (EGD) WITH PROPOFOL;  Surgeon: [name], MD;  Location: [facility] ENDOSCOPY;  Service: Endoscopy;  Laterality: N/A;  . FEMUR SURGERY     broken with rod  . FRACTURE SURGERY    . GIVENS CAPSULE STUDY N/A 02/20/2016   Procedure: GIVENS CAPSULE STUDY;  Surgeon: Scot Jun, MD;  Location: [facility] ENDOSCOPY;  Service: Endoscopy;  Laterality: N/A;  . HIP FRACTURE SURGERY    . INTRAMEDULLARY (IM) NAIL INTERTROCHANTERIC Right 11/18/2015   Procedure: INTRAMEDULLARY (IM) NAIL INTERTROCHANTRIC;  Surgeon: Juanell Fairly, MD;  Location: [facility] ORS;  Service: Orthopedics;  Laterality: Right;  . KYPHOPLASTY N/A 08/19/2014   Procedure: KYPHOPLASTY;  Surgeon: Kennedy Bucker, MD;  Location: [facility] ORS;  Service: Orthopedics;  Laterality: N/A;  . PARTIAL HYSTERECTOMY    . RIGHT/LEFT HEART CATH AND CORONARY ANGIOGRAPHY N/A 10/05/2016   Procedure: RIGHT/LEFT HEART CATH AND CORONARY ANGIOGRAPHY;  Surgeon: Kathleene Hazel, MD;  Location: [facility] INVASIVE CV LAB;  Service: Cardiovascular;  Laterality: N/A;  . TEE WITHOUT CARDIOVERSION N/A 02/12/2017   Procedure: TRANSESOPHAGEAL ECHOCARDIOGRAM (TEE);  Surgeon: Kathleene Hazel, MD;  Location: [facility] OR;  Service: Open Heart Surgery;  Laterality: N/A;  . TRANSCATHETER AORTIC VALVE REPLACEMENT, TRANSFEMORAL N/A 02/12/2017   Procedure: TRANSCATHETER AORTIC VALVE REPLACEMENT, TRANSFEMORAL using a 23mm Edwards Sapien 3 Aortic Valve;  Surgeon: Kathleene Hazel, MD;  Location: [facility] OR;  Service: Open Heart Surgery;  Laterality:  N/A;  . TUBAL LIGATION       Current Outpatient Medications  Medication Sig Dispense Refill  . acetaminophen (TYLENOL) 325 MG tablet Take 325 mg by mouth every 6 (six) hours as needed for moderate pain or headache.     . Cholecalciferol (VITAMIN D3 PO) Take 1 capsule by mouth daily.    . clindamycin (CLEOCIN) 150 MG capsule TAKE FOUR CAPSULES BY MOUTH ONE HOUR PRIOR TO APPOINTMENT  0  . clobetasol cream (TEMOVATE) 0.05 % Apply 1 application topically 2 (two) times a week.    . diclofenac sodium (VOLTAREN) 1 % GEL Voltaren 1 % topical gel  APPLY 2 GRAM TO THE AFFECTED AREA(S) BY TOPICAL ROUTE 3 TIMES PER DAY    . estradiol (ESTRACE) 0.1 MG/GM vaginal cream Place 1 Applicatorful vaginally at bedtime.    . furosemide (LASIX) 20 MG tablet Take 1 tablet (20 mg total) by mouth daily. 5 tablet 0  . iron polysaccharides (NIFEREX) 150 MG capsule Take 150 mg by mouth daily.    Marland Kitchen levothyroxine (SYNTHROID, LEVOTHROID) 75 MCG tablet Take 75 mcg by mouth daily before breakfast.    . loratadine (CLARITIN) 10 MG tablet Take by mouth.    . memantine (NAMENDA) 10 MG tablet Take 10 mg by mouth 2 (two) times daily.   0  . omeprazole (PRILOSEC) 20 MG capsule Take 1 capsule (20 mg total) by mouth daily. 90 capsule 3  . polyvinyl alcohol (LIQUIFILM TEARS) 1.4 % ophthalmic solution Place 1 drop into both eyes as needed for dry eyes.    Marland Kitchen risperiDONE (RISPERDAL) 0.5 MG tablet Take 0.5 mg by mouth 2 (two) times daily.   0  . sertraline (ZOLOFT) 25 MG tablet Take 25 mg by mouth daily.   0  . vitamin B-12 (CYANOCOBALAMIN) 1000 MCG tablet Take 1,000 mcg by mouth daily.     No current facility-administered medications for this visit.     Allergies:   Aspirin and Penicillins    Social History:  The patient  reports that she has never smoked. She has never used smokeless tobacco. She reports that she does not drink alcohol or use drugs.   Family History:  The patient's family history includes Breast cancer in her  maternal aunt; Heart block in her mother; Heart disease in her sister; Heart failure in her father.    ROS:  Please see the history of present illness.   Otherwise, review of systems are positive for none.   All other systems are reviewed and negative.    PHYSICAL EXAM: VS:  BP (!) 140/50 (BP Location: Left Arm, Patient Position: Sitting, Cuff Size: Normal)   Pulse 61   Ht 5\' 3"  (1.6 m)   Wt 125 lb 4 oz (56.8 kg)   BMI 22.19 kg/m  , BMI Body mass index is 22.19 kg/m. GEN: Well nourished, well developed, in no acute distress  HEENT: normal  Neck: no JVD, or masses.  No carotid bruits Cardiac: RRR;  rubs, or gallops,no edema .  2/ 6 systolic  murmur in the aortic area Respiratory:  clear to auscultation bilaterally, normal work of breathing GI: soft, nontender, nondistended, + BS MS: no deformity or atrophy  Skin: warm and dry, no rash Neuro:  Strength and sensation are intact Psych: euthymic mood, full affect   EKG:  EKG is  ordered today. EKG showed normal sinus rhythm with PVCs and left bundle branch block.  Recent Labs: 02/06/2017: B Natriuretic Peptide 351.7 02/13/2017: Magnesium 1.8 02/20/2017: NT-Pro BNP 1,703 03/19/2017: ALT 18; BUN 28; Creatinine, Ser 0.79; Hemoglobin 9.5; Platelets 149; Potassium 3.0; Sodium 143; TSH 4.269    Lipid Panel No results found for: CHOL, TRIG, HDL, CHOLHDL, VLDL, LDLCALC, LDLDIRECT    Wt Readings from Last 3 Encounters:  09/12/17 125 lb 4 oz (56.8 kg)  06/06/17 117 lb (53.1 kg)  04/30/17 113 lb 12.8 oz (51.6 kg)        ASSESSMENT AND PLAN:   1.  Status post TAVR for severe aortic stenosis: She is doing extremely well and her functional capacity has improved significantly.  I plan a repeat echocardiogram in 4 to 6 months.  2.  blood loss anemia: Most recent hemoglobin was 9.7.   Disposition: Follow-up with me in 4 months.  Signed,  Lorine BearsMuhammad Niala Stcharles, MD  09/12/2017 10:01 AM    [facility]

## 2017-12-06 ENCOUNTER — Emergency Department: Payer: Medicare Other

## 2017-12-06 ENCOUNTER — Emergency Department
Admission: EM | Admit: 2017-12-06 | Discharge: 2017-12-06 | Disposition: A | Discharge status: Home / Self Care | Payer: Medicare Other | Attending: Emergency Medicine | Admitting: Emergency Medicine

## 2017-12-06 ENCOUNTER — Encounter: Payer: Self-pay | Admitting: Emergency Medicine

## 2017-12-06 ENCOUNTER — Other Ambulatory Visit: Payer: Self-pay

## 2017-12-06 DIAGNOSIS — Z0000 Encounter for general adult medical examination without abnormal findings: Secondary | ICD-10-CM

## 2017-12-06 DIAGNOSIS — Z79899 Other long term (current) drug therapy: Secondary | ICD-10-CM | POA: Insufficient documentation

## 2017-12-06 DIAGNOSIS — M25512 Pain in left shoulder: Secondary | ICD-10-CM | POA: Insufficient documentation

## 2017-12-06 DIAGNOSIS — I5032 Chronic diastolic (congestive) heart failure: Secondary | ICD-10-CM | POA: Diagnosis not present

## 2017-12-06 DIAGNOSIS — J45909 Unspecified asthma, uncomplicated: Secondary | ICD-10-CM | POA: Insufficient documentation

## 2017-12-06 HISTORY — DX: Iron deficiency: E61.1

## 2017-12-06 HISTORY — DX: Deficiency of other specified B group vitamins: E53.8

## 2017-12-06 NOTE — ED Provider Notes (Signed)
[facility] Emergency Department Provider Note   ____________________________________________   First MD Initiated Contact with Patient 12/06/17 1104     (approximate)  I have reviewed the triage vital signs and the nursing notes.   HISTORY  Chief Complaint Shoulder Injury    HPI Kelsey Le is a 84 y.o. female who reports she had left shoulder pain for a week or 2.  She had an x-ray done several days ago and family is called today and told that showed a fracture.  Patient now has no pain in the shoulder and completely normal range of motion.  Family brought her in for check.  She has no history of falling to hurt the shoulder.   Past Medical History:  Diagnosis Date  . Anxiety   . Asthma   . AVM (arteriovenous malformation)    a. small intestine with recurrent GI bleeding.   . B12 deficiency   . Chronic diastolic CHF (congestive heart failure) (HCC)   . Hip fracture (HCC)    a. in the setting of mechanical fall  . Iron deficiency   . Iron deficiency anemia    a. requiring periodic transfusions  . S/P TAVR (transcatheter aortic valve replacement)    a. 02/12/17: s/p TAVR w/ an Edwards Sapien 3 THV (size 23 mm, model # 9600TFX, serial # [id number])  . Severe aortic stenosis   . Syncope and collapse   . Thyroid disease     Patient Active Problem List   Diagnosis Date Noted  . Chronic rhinitis 03/31/2017  . Vitamin B12 deficiency 03/31/2017  . Vitamin D deficiency 03/31/2017  . Left bundle branch block 03/28/2017  . Prolonged QT interval 03/28/2017  . Dementia with psychosis (HCC) 03/19/2017  . Pericardial effusion 02/15/2017  . S/P TAVR (transcatheter aortic valve replacement) 02/12/2017  . Acute on chronic diastolic heart failure (HCC) 02/12/2017  . Arteriovenous malformation (AVM) 02/12/2017  . Anemia 10/11/2016  . Iron deficiency anemia 01/15/2016  . Severe aortic stenosis 11/16/2015  . Lichen sclerosus 10/31/2015  . Lumbar  radiculitis 12/16/2013  . Osteoarthritis of hip 07/06/2013  . Trochanteric bursitis 07/06/2013    Past Surgical History:  Procedure Laterality Date  . APPENDECTOMY    . BACK SURGERY     back fusion  . CARDIAC CATHETERIZATION    . ESOPHAGOGASTRODUODENOSCOPY (EGD) WITH PROPOFOL N/A 11/15/2015   Procedure: ESOPHAGOGASTRODUODENOSCOPY (EGD) WITH PROPOFOL;  Surgeon: [name], MD;  Location: [facility] ENDOSCOPY;  Service: Endoscopy;  Laterality: N/A;  . ESOPHAGOGASTRODUODENOSCOPY (EGD) WITH PROPOFOL N/A 10/12/2016   Procedure: ESOPHAGOGASTRODUODENOSCOPY (EGD) WITH PROPOFOL;  Surgeon: [name], MD;  Location: [facility] ENDOSCOPY;  Service: Endoscopy;  Laterality: N/A;  . FEMUR SURGERY     broken with rod  . FRACTURE SURGERY    . GIVENS CAPSULE STUDY N/A 02/20/2016   Procedure: GIVENS CAPSULE STUDY;  Surgeon: Scot Jun, MD;  Location: [facility] ENDOSCOPY;  Service: Endoscopy;  Laterality: N/A;  . HIP FRACTURE SURGERY    . INTRAMEDULLARY (IM) NAIL INTERTROCHANTERIC Right 11/18/2015   Procedure: INTRAMEDULLARY (IM) NAIL INTERTROCHANTRIC;  Surgeon: Juanell Fairly, MD;  Location: [facility] ORS;  Service: Orthopedics;  Laterality: Right;  . KYPHOPLASTY N/A 08/19/2014   Procedure: KYPHOPLASTY;  Surgeon: Kennedy Bucker, MD;  Location: [facility] ORS;  Service: Orthopedics;  Laterality: N/A;  . PARTIAL HYSTERECTOMY    . RIGHT/LEFT HEART CATH AND CORONARY ANGIOGRAPHY N/A 10/05/2016   Procedure: RIGHT/LEFT HEART CATH AND CORONARY ANGIOGRAPHY;  Surgeon: Kathleene Hazel, MD;  Location: [facility] INVASIVE  CV LAB;  Service: Cardiovascular;  Laterality: N/A;  . TEE WITHOUT CARDIOVERSION N/A 02/12/2017   Procedure: TRANSESOPHAGEAL ECHOCARDIOGRAM (TEE);  Surgeon: Kathleene Hazel, MD;  Location: [facility] OR;  Service: Open Heart Surgery;  Laterality: N/A;  . TRANSCATHETER AORTIC VALVE REPLACEMENT, TRANSFEMORAL N/A 02/12/2017   Procedure: TRANSCATHETER AORTIC VALVE REPLACEMENT, TRANSFEMORAL using a 23mm Edwards Sapien 3 Aortic  Valve;  Surgeon: Kathleene Hazel, MD;  Location: [facility] OR;  Service: Open Heart Surgery;  Laterality: N/A;  . TUBAL LIGATION      Prior to Admission medications   Medication Sig Start Date End Date Taking? Authorizing Provider  acetaminophen (TYLENOL) 325 MG tablet Take 650 mg by mouth every 6 (six) hours as needed for moderate pain or headache.    Yes [provider]  cholecalciferol (VITAMIN D) 25 MCG (1000 UT) tablet Take 1,000 Units by mouth daily.    Yes [provider]  clindamycin (CLEOCIN) 150 MG capsule Take 6,000 mg by mouth as directed. 1 hour before dental procedures 09/03/17  Yes [provider]  clobetasol cream (TEMOVATE) 0.05 % Apply 1 application topically See admin instructions. Apply small amount vaginally on Monday and Friday   Yes [provider]  estradiol (ESTRACE) 0.1 MG/GM vaginal cream Place 1 Applicatorful vaginally See admin instructions. Apply 0.5 grams vaginally Tuesday and Thursday morning   Yes [provider]  furosemide (LASIX) 20 MG tablet Take 1 tablet (20 mg total) by mouth daily. 02/21/17 02/21/18 Yes Janetta Hora, PA-C  iron polysaccharides (NIFEREX) 150 MG capsule Take 150 mg by mouth daily.   Yes [provider]  levothyroxine (SYNTHROID, LEVOTHROID) 50 MCG tablet Take 50 mcg by mouth daily before breakfast.    Yes [provider]  loratadine (CLARITIN) 10 MG tablet Take 10 mg by mouth daily.  02/26/17 02/26/18 Yes [provider]  memantine (NAMENDA) 10 MG tablet Take 10 mg by mouth 2 (two) times daily.  07/11/17  Yes [provider]  omeprazole (PRILOSEC) 20 MG capsule Take 1 capsule (20 mg total) by mouth daily. 02/21/17  Yes [name], MD  polyvinyl alcohol (LIQUIFILM TEARS) 1.4 % ophthalmic solution Place 1 drop into both eyes as needed for dry eyes.   Yes [provider]  risperiDONE (RISPERDAL) 0.5 MG tablet Take 0.5 mg by mouth 2 (two) times daily.  04/04/17   Yes [provider]  sertraline (ZOLOFT) 50 MG tablet Take 50 mg by mouth daily.  04/04/17  Yes [provider]  vitamin B-12 (CYANOCOBALAMIN) 1000 MCG tablet Take 1,000 mcg by mouth daily.   Yes [provider]    Allergies Aspirin and Penicillins  Family History  Problem Relation Age of Onset  . Heart block Mother   . Heart failure Father   . Heart disease Sister   . Breast cancer Maternal Aunt     Social History Social History   Tobacco Use  . Smoking status: Never Smoker  . Smokeless tobacco: Never Used  Substance Use Topics  . Alcohol use: No  . Drug use: No    Review of Systems  Constitutional: No fever/chills Eyes: No visual changes. ENT: No sore throat. Cardiovascular: Denies chest pain. Respiratory: Denies shortness of breath. Gastrointestinal: No abdominal pain.  No nausea, no vomiting.. Genitourinary: Negative for dysuria. Musculoskeletal: Negative for back pain. Skin: Negative for rash. Neurological: Negative for headaches, focal weakness   ____________________________________________   PHYSICAL EXAM:  VITAL SIGNS: ED Triage Vitals  Enc Vitals Group  BP 12/06/17 1057 (!) 195/58     Pulse Rate 12/06/17 1057 68     Resp 12/06/17 1057 18     Temp 12/06/17 1057 98 F (36.7 C)     Temp Source 12/06/17 1057 Oral     SpO2 12/06/17 1057 98 %     Weight 12/06/17 1058 129 lb (58.5 kg)     Height 12/06/17 1058 5\' 3"  (1.6 m)     Head Circumference --      Peak Flow --      Pain Score 12/06/17 1058 0     Pain Loc --      Pain Edu? --      Excl. in GC? --     Constitutional: Alert and oriented. Well appearing and in no acute distress. Eyes: Conjunctivae are normal.  Head: Atraumatic. Nose: No congestion/rhinnorhea. Mouth/Throat: Mucous membranes are moist.   Neck: No stridor. Respiratory: Normal respiratory effort.  No retractions. Musculoskeletal: Full normal range of motion of both shoulders and elbows.  There is no  bruising or swelling or tenderness to palpation anywhere in either arm. Neurologic:  Normal speech and language. No gross focal neurologic deficits are appreciated. Skin:  Skin is warm, dry and intact. No rash noted. Psychiatric: Mood and affect are normal. Speech and behavior are normal.  ____________________________________________   LABS (all labs ordered are listed, but only abnormal results are displayed)  Labs Reviewed - No data to display ____________________________________________  EKG   ____________________________________________  RADIOLOGY  ED MD interpretation: X-ray read by radiology reviewed by me shows no sign of fracture  Official radiology report(s): Dg Humerus Left  Result Date: 12/06/2017 CLINICAL DATA:  Left shoulder pain for 2 weeks. EXAM: LEFT HUMERUS - 2+ VIEW COMPARISON:  Radiographs of February 19, 2014. FINDINGS: There is no evidence of fracture or other focal bone lesions. Soft tissues are unremarkable. IMPRESSION: Negative. Electronically Signed   By: Lupita RaiderJames  Green Jr, M.D.   On: 12/06/2017 11:22    ____________________________________________   PROCEDURES  Procedure(s) performed:   Procedures  Critical Care performed:   ____________________________________________   INITIAL IMPRESSION / ASSESSMENT AND PLAN / ED COURSE  No fracture seen.  Patient looks well.  She feels well.  We will let her go home.      ____________________________________________   FINAL CLINICAL IMPRESSION(S) / ED DIAGNOSES  Final diagnoses:  Normal exam     ED Discharge Orders    None       Note:  This document was prepared using Dragon voice recognition software and may include unintentional dictation errors.    Arnaldo NatalMalinda, Paul F, MD 12/06/17 1131

## 2017-12-06 NOTE — Discharge Instructions (Signed)
No fracture is seen on the x-ray by myself or radiology.  Please return as needed.

## 2017-12-06 NOTE — ED Triage Notes (Signed)
Patient from [facility] with her family. Per daughter, patient has been complaining of pain in left shoulder. 2 weeks ago she had an xray done and when she got the results today it is positive for left humeral fracture. Patient denies injury. Had started new exercises prior to pain. Denies any pain currently and good range of motion noted.

## 2017-12-09 ENCOUNTER — Telehealth: Payer: Self-pay | Admitting: Cardiovascular Disease

## 2017-12-09 NOTE — Telephone Encounter (Signed)
Returned the call to the patient's daughter per the dpr. She stated that [facility] where he mother resides, did an EKG that showed right bundle branch block. She wanted to now if the patient needed to be seen earlier than her 01/10/18 appointment with Dr. Kirke CorinArida due to the results. She stated that patient is not having any symptoms that would be alarming such as chest pain or shortness of breath. She has been advised that it should be fine to keep her appointment in January but if Dr. Kirke CorinArida advised otherwise than we would let her know.

## 2017-12-09 NOTE — Telephone Encounter (Signed)
Patient [name] [facility] end of [facility], just got the results from the NP States that they saw a right bundle blockage Patient does not complain of any symptoms or chest pain Would like to know what we suggest, if patient should be seen sooner than 1/10 Please call to discuss

## 2017-12-09 NOTE — Telephone Encounter (Signed)
We are aware that she developed left bundle branch block since her TAVR which is not unusual.  If she is not having any symptoms, January appointment is fine.

## 2017-12-09 NOTE — Telephone Encounter (Signed)
The patient's daughter has been made aware and verbalized her understanding.  

## 2018-01-07 ENCOUNTER — Other Ambulatory Visit: Payer: Self-pay

## 2018-01-07 DIAGNOSIS — I350 Nonrheumatic aortic (valve) stenosis: Secondary | ICD-10-CM

## 2018-01-07 DIAGNOSIS — Z952 Presence of prosthetic heart valve: Secondary | ICD-10-CM

## 2018-01-09 ENCOUNTER — Encounter: Payer: Self-pay | Admitting: Cardiovascular Disease

## 2018-01-09 ENCOUNTER — Ambulatory Visit (INDEPENDENT_AMBULATORY_CARE_PROVIDER_SITE_OTHER): Payer: Medicare Other | Admitting: Cardiovascular Disease

## 2018-01-09 VITALS — BP 158/40 | HR 56 | Ht 64.0 in | Wt 129.5 lb

## 2018-01-09 DIAGNOSIS — Z952 Presence of prosthetic heart valve: Secondary | ICD-10-CM

## 2018-01-09 DIAGNOSIS — I447 Left bundle-branch block, unspecified: Secondary | ICD-10-CM

## 2018-01-09 NOTE — Progress Notes (Signed)
Cardiology Office Note   Date:  01/09/2018   ID:  Kelsey Le, DOB 1930-07-21, MRN [id number]  PCP:  [facility]  Cardiologist:   Lorine Bears, MD   Chief Complaint  Patient presents with  . OTHER    4 month f/u c/o head feeling like its stuffy and spaced out. Meds reviewed verbally with pt.      History of Present Illness: Kelsey Le is a 85 y.o. female who presents for a follow-up visit regarding aortic stenosis status post TAVR in February 2019.. Catheterization before TAVR showed no significant coronary artery disease. The patient has known history of recurrent GI bleeding due to AVMs and iron deficiency anemia. She has chronic back pain and has underwent kyphoplasty in the past.  She also has known history of anxiety with mild memory problems.  She has been staying at [facility] and has been doing very well with mild exertional dyspnea.  She has been participating in the exercise class almost daily and overall stamina improved significantly.  No chest pain.  She has mild dyspnea with overexertion.  No orthopnea or PND.  She continues to wear support stocking for leg edema.  She gets her labs done at [facility] and most recent hemoglobin was above 10.  Past Medical History:  Diagnosis Date  . Anxiety   . Asthma   . AVM (arteriovenous malformation)    a. small intestine with recurrent GI bleeding.   . B12 deficiency   . Chronic diastolic CHF (congestive heart failure) (HCC)   . Hip fracture (HCC)    a. in the setting of mechanical fall  . Iron deficiency   . Iron deficiency anemia    a. requiring periodic transfusions  . Psychosis (HCC)   . S/P TAVR (transcatheter aortic valve replacement)    a. 02/12/17: s/p TAVR w/ an Edwards Sapien 3 THV (size 23 mm, model # 9600TFX, serial # [id number])  . Severe aortic stenosis   . Syncope and collapse   . Thyroid disease     Past Surgical History:  Procedure Laterality Date  . APPENDECTOMY    .  BACK SURGERY     back fusion  . CARDIAC CATHETERIZATION    . ESOPHAGOGASTRODUODENOSCOPY (EGD) WITH PROPOFOL N/A 11/15/2015   Procedure: ESOPHAGOGASTRODUODENOSCOPY (EGD) WITH PROPOFOL;  Surgeon: [name], MD;  Location: [facility] ENDOSCOPY;  Service: Endoscopy;  Laterality: N/A;  . ESOPHAGOGASTRODUODENOSCOPY (EGD) WITH PROPOFOL N/A 10/12/2016   Procedure: ESOPHAGOGASTRODUODENOSCOPY (EGD) WITH PROPOFOL;  Surgeon: [name], MD;  Location: [facility] ENDOSCOPY;  Service: Endoscopy;  Laterality: N/A;  . FEMUR SURGERY     broken with rod  . FRACTURE SURGERY    . GIVENS CAPSULE STUDY N/A 02/20/2016   Procedure: GIVENS CAPSULE STUDY;  Surgeon: Scot Jun, MD;  Location: [facility] ENDOSCOPY;  Service: Endoscopy;  Laterality: N/A;  . HIP FRACTURE SURGERY    . INTRAMEDULLARY (IM) NAIL INTERTROCHANTERIC Right 11/18/2015   Procedure: INTRAMEDULLARY (IM) NAIL INTERTROCHANTRIC;  Surgeon: Juanell Fairly, MD;  Location: [facility] ORS;  Service: Orthopedics;  Laterality: Right;  . KYPHOPLASTY N/A 08/19/2014   Procedure: KYPHOPLASTY;  Surgeon: Kennedy Bucker, MD;  Location: [facility] ORS;  Service: Orthopedics;  Laterality: N/A;  . PARTIAL HYSTERECTOMY    . RIGHT/LEFT HEART CATH AND CORONARY ANGIOGRAPHY N/A 10/05/2016   Procedure: RIGHT/LEFT HEART CATH AND CORONARY ANGIOGRAPHY;  Surgeon: Kathleene Hazel, MD;  Location: [facility] INVASIVE CV LAB;  Service: Cardiovascular;  Laterality: N/A;  . TEE WITHOUT CARDIOVERSION  N/A 02/12/2017   Procedure: TRANSESOPHAGEAL ECHOCARDIOGRAM (TEE);  Surgeon: Kathleene HazelMcAlhany, Christopher D, MD;  [facility];  Service: Open Heart Surgery;  Laterality: N/A;  . TRANSCATHETER AORTIC VALVE REPLACEMENT, TRANSFEMORAL N/A 02/12/2017   Procedure: TRANSCATHETER AORTIC VALVE REPLACEMENT, TRANSFEMORAL using a 23mm Edwards Sapien 3 Aortic Valve;  Surgeon: Kathleene HazelMcAlhany, Christopher D, MD;  Location: [facility] OR;  Service: Open Heart Surgery;  Laterality: N/A;  . TUBAL LIGATION       Current Outpatient Medications    Medication Sig Dispense Refill  . acetaminophen (TYLENOL) 325 MG tablet Take 650 mg by mouth every 6 (six) hours as needed for moderate pain or headache.     . Calcium Carbonate-Vitamin D (CALCIUM-VITAMIN D) 600-125 MG-UNIT TABS Take by mouth daily.    . clindamycin (CLEOCIN) 150 MG capsule Take 6,000 mg by mouth as directed. 1 hour before dental procedures  0  . clobetasol cream (TEMOVATE) 0.05 % Apply 1 application topically See admin instructions. Apply small amount vaginally on Monday and Friday    . estradiol (ESTRACE) 0.1 MG/GM vaginal cream Place 1 Applicatorful vaginally See admin instructions. Apply 0.5 grams vaginally Tuesday and Thursday morning    . furosemide (LASIX) 20 MG tablet Take 1 tablet (20 mg total) by mouth daily. 5 tablet 0  . iron polysaccharides (NIFEREX) 150 MG capsule Take 150 mg by mouth daily.    Marland Kitchen. levothyroxine (SYNTHROID, LEVOTHROID) 50 MCG tablet Take 50 mcg by mouth daily before breakfast.     . loratadine (CLARITIN) 10 MG tablet Take 10 mg by mouth daily.     . memantine (NAMENDA) 10 MG tablet Take 10 mg by mouth 2 (two) times daily.   0  . omeprazole (PRILOSEC) 20 MG capsule Take 1 capsule (20 mg total) by mouth daily. 90 capsule 3  . polyvinyl alcohol (LIQUIFILM TEARS) 1.4 % ophthalmic solution Place 1 drop into both eyes as needed for dry eyes.    Marland Kitchen. risperiDONE (RISPERDAL) 0.5 MG tablet Take 0.5 mg by mouth 2 (two) times daily.   0  . sertraline (ZOLOFT) 50 MG tablet Take 50 mg by mouth daily.   0  . vitamin B-12 (CYANOCOBALAMIN) 1000 MCG tablet Take 1,000 mcg by mouth daily.     No current facility-administered medications for this visit.     Allergies:   Aspirin and Penicillins    Social History:  The patient  reports that she has never smoked. She has never used smokeless tobacco. She reports that she does not drink alcohol or use drugs.   Family History:  The patient's family history includes Breast cancer in her maternal aunt; Heart block in her  mother; Heart disease in her sister; Heart failure in her father.    ROS:  Please see the history of present illness.   Otherwise, review of systems are positive for none.   All other systems are reviewed and negative.    PHYSICAL EXAM: VS:  BP (!) 158/40 (BP Location: Left Arm, Patient Position: Sitting, Cuff Size: Normal)   Pulse (!) 56   Ht 5\' 4"  (1.626 m)   Wt 129 lb 8 oz (58.7 kg)   BMI 22.23 kg/m  , BMI Body mass index is 22.23 kg/m. GEN: Well nourished, well developed, in no acute distress  HEENT: normal  Neck: no JVD, or masses.  No carotid bruits Cardiac: RRR;  rubs, or gallops,no edema .  3/ 6 systolic murmur in the aortic area Respiratory:  clear to auscultation bilaterally, normal work of  breathing GI: soft, nontender, nondistended, + BS MS: no deformity or atrophy  Skin: warm and dry, no rash Neuro:  Strength and sensation are intact Psych: euthymic mood, full affect   EKG:  EKG is  ordered today. EKG showed normal sinus rhythm with  left bundle branch block.  Recent Labs: 02/06/2017: B Natriuretic Peptide 351.7 02/13/2017: Magnesium 1.8 02/20/2017: NT-Pro BNP 1,703 03/19/2017: ALT 18; BUN 28; Creatinine, Ser 0.79; Hemoglobin 9.5; Platelets 149; Potassium 3.0; Sodium 143; TSH 4.269    Lipid Panel No results found for: CHOL, TRIG, HDL, CHOLHDL, VLDL, LDLCALC, LDLDIRECT    Wt Readings from Last 3 Encounters:  01/09/18 129 lb 8 oz (58.7 kg)  12/06/17 129 lb (58.5 kg)  09/12/17 125 lb 4 oz (56.8 kg)        ASSESSMENT AND PLAN:   1.  Status post TAVR for severe aortic stenosis: She is doing extremely well and her functional capacity has improved significantly.  She is currently [place] heart association class II. She is scheduled for a routine echocardiogram later this month.  2.  blood loss anemia: Most recent hemoglobin was above 10.  3.  Left bundle branch block: This developed post TAVR.  Currently she has no symptoms related to this.   Disposition:  Follow-up with me in 6 months.  Signed,  Lorine Bears, MD  01/09/2018 12:07 PM    [facility]

## 2018-01-09 NOTE — Patient Instructions (Signed)

## 2018-01-10 ENCOUNTER — Ambulatory Visit: Payer: Medicare Other | Admitting: Cardiovascular Disease

## 2018-01-14 ENCOUNTER — Encounter: Payer: Self-pay | Admitting: Thoracic Surgery (Cardiothoracic Vascular Surgery)

## 2018-01-31 ENCOUNTER — Ambulatory Visit (INDEPENDENT_AMBULATORY_CARE_PROVIDER_SITE_OTHER): Payer: Medicare Other

## 2018-01-31 DIAGNOSIS — I350 Nonrheumatic aortic (valve) stenosis: Secondary | ICD-10-CM

## 2018-01-31 DIAGNOSIS — Z952 Presence of prosthetic heart valve: Secondary | ICD-10-CM

## 2018-02-03 ENCOUNTER — Telehealth: Payer: Self-pay | Admitting: Cardiovascular Disease

## 2018-02-03 NOTE — Telephone Encounter (Signed)
Patient's daughter is calling for Echo results, will be home today until about 3:20pm.

## 2018-02-03 NOTE — Telephone Encounter (Signed)
Returned call to pt daughter Olegario Messier. Olegario Messier made aware of echo results with verbalized understanding.  Notes recorded by Janetta Hora, PA-C on 02/02/2018 at 9:17 PM EST Valve functioning normally with acceptable gradients. She still has a small pericardial effusion. Continue normal follow up with Dr. Kirke Corin.

## 2018-03-04 ENCOUNTER — Other Ambulatory Visit: Payer: Self-pay | Admitting: Internal Medicine

## 2018-03-04 DIAGNOSIS — R1310 Dysphagia, unspecified: Secondary | ICD-10-CM

## 2018-03-10 ENCOUNTER — Ambulatory Visit
Admission: RE | Admit: 2018-03-10 | Discharge: 2018-03-10 | Disposition: A | Discharge status: Home / Self Care | Payer: Medicare Other | Source: Ambulatory Visit | Attending: Internal Medicine | Admitting: Internal Medicine

## 2018-03-10 DIAGNOSIS — R1310 Dysphagia, unspecified: Secondary | ICD-10-CM | POA: Diagnosis not present

## 2018-03-20 ENCOUNTER — Telehealth (HOSPITAL_COMMUNITY): Payer: Self-pay | Admitting: Psychiatry

## 2018-04-18 ENCOUNTER — Encounter: Payer: Self-pay | Admitting: Intensive Care

## 2018-04-18 ENCOUNTER — Emergency Department: Payer: Medicare Other

## 2018-04-18 ENCOUNTER — Emergency Department
Admission: EM | Admit: 2018-04-18 | Discharge: 2018-04-21 | Disposition: A | Discharge status: Other Acute Inpatient Hospital | Payer: Medicare Other | Attending: Emergency Medicine | Admitting: Emergency Medicine

## 2018-04-18 ENCOUNTER — Other Ambulatory Visit: Payer: Self-pay

## 2018-04-18 DIAGNOSIS — R4182 Altered mental status, unspecified: Secondary | ICD-10-CM | POA: Diagnosis present

## 2018-04-18 DIAGNOSIS — Z79899 Other long term (current) drug therapy: Secondary | ICD-10-CM | POA: Insufficient documentation

## 2018-04-18 DIAGNOSIS — I5032 Chronic diastolic (congestive) heart failure: Secondary | ICD-10-CM | POA: Diagnosis not present

## 2018-04-18 DIAGNOSIS — J45909 Unspecified asthma, uncomplicated: Secondary | ICD-10-CM | POA: Diagnosis not present

## 2018-04-18 DIAGNOSIS — R410 Disorientation, unspecified: Secondary | ICD-10-CM

## 2018-04-18 DIAGNOSIS — F29 Unspecified psychosis not due to a substance or known physiological condition: Secondary | ICD-10-CM | POA: Diagnosis not present

## 2018-04-18 LAB — CBC
HCT: 34.8 % — ABNORMAL LOW (ref 36.0–46.0)
Hemoglobin: 10.7 g/dL — ABNORMAL LOW (ref 12.0–15.0)
MCH: 29.7 pg (ref 26.0–34.0)
MCHC: 30.7 g/dL (ref 30.0–36.0)
MCV: 96.7 fL (ref 80.0–100.0)
Platelets: 153 10*3/uL (ref 150–400)
RBC: 3.6 MIL/uL — ABNORMAL LOW (ref 3.87–5.11)
RDW: 14.5 % (ref 11.5–15.5)
WBC: 5 10*3/uL (ref 4.0–10.5)
nRBC: 0 % (ref 0.0–0.2)

## 2018-04-18 LAB — COMPREHENSIVE METABOLIC PANEL
ALT: 15 U/L (ref 0–44)
AST: 26 U/L (ref 15–41)
Albumin: 4.4 g/dL (ref 3.5–5.0)
Alkaline Phosphatase: 72 U/L (ref 38–126)
Anion gap: 10 (ref 5–15)
BUN: 36 mg/dL — ABNORMAL HIGH (ref 8–23)
CO2: 22 mmol/L (ref 22–32)
Calcium: 9.3 mg/dL (ref 8.9–10.3)
Chloride: 111 mmol/L (ref 98–111)
Creatinine, Ser: 0.72 mg/dL (ref 0.44–1.00)
GFR calc Af Amer: >60 mL/min (ref >60)
GFR calc non Af Amer: >60 mL/min (ref >60)
Glucose, Bld: 94 mg/dL (ref 70–99)
Potassium: 3.4 mmol/L — ABNORMAL LOW (ref 3.5–5.1)
Sodium: 143 mmol/L (ref 135–145)
Total Bilirubin: 0.7 mg/dL (ref 0.3–1.2)
Total Protein: 7.5 g/dL (ref 6.5–8.1)

## 2018-04-18 LAB — URINALYSIS, COMPLETE (UACMP) WITH MICROSCOPIC
Bacteria, UA: NONE SEEN
Bilirubin Urine: NEGATIVE
Glucose, UA: NEGATIVE mg/dL
Ketones, ur: 5 mg/dL — AB
Leukocytes,Ua: NEGATIVE
Nitrite: NEGATIVE
Protein, ur: NEGATIVE mg/dL
Specific Gravity, Urine: 1.025 (ref 1.005–1.030)
pH: 5 (ref 5.0–8.0)

## 2018-04-18 LAB — ACETAMINOPHEN LEVEL: Acetaminophen (Tylenol), Serum: <10 ug/mL — ABNORMAL LOW (ref 10–30)

## 2018-04-18 LAB — GLUCOSE, CAPILLARY: Glucose-Capillary: 91 mg/dL (ref 70–99)

## 2018-04-18 LAB — AMMONIA: Ammonia: 14 umol/L (ref 9–35)

## 2018-04-18 LAB — TSH: TSH: 1.588 u[IU]/mL (ref 0.350–4.500)

## 2018-04-18 LAB — ETHANOL: Alcohol, Ethyl (B): <10 mg/dL (ref <10)

## 2018-04-18 LAB — SALICYLATE LEVEL: Salicylate Lvl: <7 mg/dL (ref 2.8–30.0)

## 2018-04-18 MED ORDER — POLYVINYL ALCOHOL 1.4 % OP SOLN
1.0000 [drp] | OPHTHALMIC | Status: DC | PRN
  Filled 2018-04-18: qty 15

## 2018-04-18 MED ORDER — LEVOTHYROXINE SODIUM 50 MCG PO TABS
50.0000 ug | ORAL_TABLET | Freq: Every day | ORAL | Status: DC
  Administered 2018-04-19 – 2018-04-21 (×3): 50 ug via ORAL
  Filled 2018-04-18 (×3): qty 1

## 2018-04-18 MED ORDER — PAROXETINE HCL 30 MG PO TABS
15.0000 mg | ORAL_TABLET | Freq: Every day | ORAL | Status: DC
  Administered 2018-04-18 – 2018-04-19 (×2): 15 mg via ORAL
  Filled 2018-04-18 (×2): qty 0.5

## 2018-04-18 MED ORDER — FUROSEMIDE 40 MG PO TABS
20.0000 mg | ORAL_TABLET | Freq: Every day | ORAL | Status: DC
  Administered 2018-04-18 – 2018-04-21 (×4): 20 mg via ORAL
  Filled 2018-04-18 (×4): qty 1

## 2018-04-18 MED ORDER — RISPERIDONE 1 MG PO TABS
0.5000 mg | ORAL_TABLET | Freq: Two times a day (BID) | ORAL | Status: DC
  Administered 2018-04-18 – 2018-04-21 (×5): 0.5 mg via ORAL
  Filled 2018-04-18 (×5): qty 1
  Filled 2018-04-18: qty 0.5

## 2018-04-18 MED ORDER — SODIUM CHLORIDE 0.9% FLUSH: 3.0000 mL | Freq: Once | INTRAVENOUS | Status: DC

## 2018-04-18 NOTE — ED Notes (Signed)
NT Kelsey Le assisting pt to/from bed/toilet. Daughter remains at bedside.

## 2018-04-18 NOTE — ED Notes (Signed)
Pt leaving for CT.  

## 2018-04-18 NOTE — ED Notes (Signed)
Assisted nurse with Kelsey Le with putting patient on bedpan and pericare.AS

## 2018-04-18 NOTE — ED Notes (Signed)
Attempted IV x1. Will attempt again soon.

## 2018-04-18 NOTE — ED Notes (Signed)
This RN and 2 NT provided peri care as pt urinated in briefs. Bed linens changed. Bed in lowest position. Repositioned in bed. Rails up. Door open for safety.

## 2018-04-18 NOTE — ED Notes (Signed)
Daughter at bedside with pt. Call bell within reach. Bed in lowest position. Rails up.

## 2018-04-18 NOTE — ED Notes (Signed)
First nurse Lurena Joiner has copy of pt's son/daughter's shared guardianship paperwork and will place copy in chart.

## 2018-04-18 NOTE — ED Notes (Signed)
Pt remains A&Ox1. Self only.

## 2018-04-18 NOTE — ED Triage Notes (Signed)
Patient from [facility] with daughter. Daughter reports hallucinations and hearing voices. Staff reports she hasnt slept in three days. Staff and daughter reports she is not acting herself the past week. Reports "some animals were in her bed (racoon) and someone was outside her window). Diagnosed with psychosis last year and sent her to [facility]. Staff reports she refused medicine on Monday but has been taking since then. Alert to self only.

## 2018-04-18 NOTE — ED Provider Notes (Addendum)
[facility] Emergency Department Provider Note  ____________________________________________   I have reviewed the triage vital signs and the nursing notes. Where available I have reviewed prior notes and, if possible and indicated, outside hospital notes.    HISTORY  Chief Complaint Altered Mental Status    HPI Kelsey Le is a 85 y.o. female with a history of psychosis in the past, TAVR, CHF, anxiety asthma anemia thyroid disease, presents today complaining of feeling anxious.  She is apparently not slept for the last 3 days very much.  According to family, she had several changes in her home medications recently and on Monday they went rapidly down on her SSRI medication and her decompensation began around that time.  They then try to go back up to 15 mg, on her SSRI per day after going down to 10 and having been on 20 before that.  However, she began to hallucinate and became very confused.  No other complaints, seems to have waxing and waning confusion very similar to multiple prior episodes of this for this patient no complaint of fever vomiting diarrhea abdominal pain dysuria cough or any other issues.  No fall. Patient has a history of dementia with this degree of disorder in the past    Past Medical History:  Diagnosis Date  . Anxiety   . Asthma   . AVM (arteriovenous malformation)    a. small intestine with recurrent GI bleeding.   . B12 deficiency   . Chronic diastolic CHF (congestive heart failure) (HCC)   . Hip fracture (HCC)    a. in the setting of mechanical fall  . Iron deficiency   . Iron deficiency anemia    a. requiring periodic transfusions  . Psychosis (HCC)   . S/P TAVR (transcatheter aortic valve replacement)    a. 02/12/17: s/p TAVR w/ an Edwards Sapien 3 THV (size 23 mm, model # 9600TFX, serial # [id number])  . Severe aortic stenosis   . Syncope and collapse   . Thyroid disease     Patient Active Problem List   Diagnosis  Date Noted  . Chronic rhinitis 03/31/2017  . Vitamin B12 deficiency 03/31/2017  . Vitamin D deficiency 03/31/2017  . Left bundle branch block 03/28/2017  . Prolonged QT interval 03/28/2017  . Dementia with psychosis (HCC) 03/19/2017  . Pericardial effusion 02/15/2017  . S/P TAVR (transcatheter aortic valve replacement) 02/12/2017  . Acute on chronic diastolic heart failure (HCC) 02/12/2017  . Arteriovenous malformation (AVM) 02/12/2017  . Anemia 10/11/2016  . Iron deficiency anemia 01/15/2016  . Severe aortic stenosis 11/16/2015  . Lichen sclerosus 10/31/2015  . Lumbar radiculitis 12/16/2013  . Osteoarthritis of hip 07/06/2013  . Trochanteric bursitis 07/06/2013    Past Surgical History:  Procedure Laterality Date  . APPENDECTOMY    . BACK SURGERY     back fusion  . CARDIAC CATHETERIZATION    . ESOPHAGOGASTRODUODENOSCOPY (EGD) WITH PROPOFOL N/A 11/15/2015   Procedure: ESOPHAGOGASTRODUODENOSCOPY (EGD) WITH PROPOFOL;  Surgeon: [name], MD;  Location: [facility] ENDOSCOPY;  Service: Endoscopy;  Laterality: N/A;  . ESOPHAGOGASTRODUODENOSCOPY (EGD) WITH PROPOFOL N/A 10/12/2016   Procedure: ESOPHAGOGASTRODUODENOSCOPY (EGD) WITH PROPOFOL;  Surgeon: [name], MD;  Location: [facility] ENDOSCOPY;  Service: Endoscopy;  Laterality: N/A;  . FEMUR SURGERY     broken with rod  . FRACTURE SURGERY    . GIVENS CAPSULE STUDY N/A 02/20/2016   Procedure: GIVENS CAPSULE STUDY;  Surgeon: Scot Jun, MD;  Location: [facility] ENDOSCOPY;  Service:  Endoscopy;  Laterality: N/A;  . HIP FRACTURE SURGERY    . INTRAMEDULLARY (IM) NAIL INTERTROCHANTERIC Right 11/18/2015   Procedure: INTRAMEDULLARY (IM) NAIL INTERTROCHANTRIC;  Surgeon: Juanell Fairly, MD;  Location: [facility] ORS;  Service: Orthopedics;  Laterality: Right;  . KYPHOPLASTY N/A 08/19/2014   Procedure: KYPHOPLASTY;  Surgeon: Kennedy Bucker, MD;  Location: [facility] ORS;  Service: Orthopedics;  Laterality: N/A;  . PARTIAL HYSTERECTOMY    . RIGHT/LEFT HEART CATH  AND CORONARY ANGIOGRAPHY N/A 10/05/2016   Procedure: RIGHT/LEFT HEART CATH AND CORONARY ANGIOGRAPHY;  Surgeon: Kathleene Hazel, MD;  Location: [facility]ASIVE CV LAB;  Service: Cardiovascular;  Laterality: N/A;  . TEE WITHOUT CARDIOVERSION N/A 02/12/2017   Procedure: TRANSESOPHAGEAL ECHOCARDIOGRAM (TEE);  Surgeon: Kathleene Hazel, MD;  Location: [facility] OR;  Service: Open Heart Surgery;  Laterality: N/A;  . TRANSCATHETER AORTIC VALVE REPLACEMENT, TRANSFEMORAL N/A 02/12/2017   Procedure: TRANSCATHETER AORTIC VALVE REPLACEMENT, TRANSFEMORAL using a 23mm Edwards Sapien 3 Aortic Valve;  Surgeon: Kathleene Hazel, MD;  Location: [facility] OR;  Service: Open Heart Surgery;  Laterality: N/A;  . TUBAL LIGATION      Prior to Admission medications   Medication Sig Start Date End Date Taking? Authorizing Provider  acetaminophen (TYLENOL) 325 MG tablet Take 650 mg by mouth every 6 (six) hours as needed for moderate pain or headache.     [provider]  Calcium Carbonate-Vitamin D (CALCIUM-VITAMIN D) 600-125 MG-UNIT TABS Take by mouth daily.    [provider]  clindamycin (CLEOCIN) 150 MG capsule Take 6,000 mg by mouth as directed. 1 hour before dental procedures 09/03/17   [provider]  clobetasol cream (TEMOVATE) 0.05 % Apply 1 application topically See admin instructions. Apply small amount vaginally on Monday and Friday    [provider]  estradiol (ESTRACE) 0.1 MG/GM vaginal cream Place 1 Applicatorful vaginally See admin instructions. Apply 0.5 grams vaginally Tuesday and Thursday morning    [provider]  furosemide (LASIX) 20 MG tablet Take 1 tablet (20 mg total) by mouth daily. 02/21/17 02/21/18  Janetta Hora, PA-C  iron polysaccharides (NIFEREX) 150 MG capsule Take 150 mg by mouth daily.    [provider]  levothyroxine (SYNTHROID, LEVOTHROID) 50 MCG tablet Take 50 mcg by mouth daily before breakfast.     [provider]   loratadine (CLARITIN) 10 MG tablet Take 10 mg by mouth daily.  02/26/17 02/26/18  [provider]  memantine (NAMENDA) 10 MG tablet Take 10 mg by mouth 2 (two) times daily.  07/11/17   [provider]  omeprazole (PRILOSEC) 20 MG capsule Take 1 capsule (20 mg total) by mouth daily. 02/21/17   [name], MD  polyvinyl alcohol (LIQUIFILM TEARS) 1.4 % ophthalmic solution Place 1 drop into both eyes as needed for dry eyes.    [provider]  risperiDONE (RISPERDAL) 0.5 MG tablet Take 0.5 mg by mouth 2 (two) times daily.  04/04/17   [provider]  sertraline (ZOLOFT) 50 MG tablet Take 50 mg by mouth daily.  04/04/17   [provider]  vitamin B-12 (CYANOCOBALAMIN) 1000 MCG tablet Take 1,000 mcg by mouth daily.    [provider]    Allergies Aspirin and Penicillins  Family History  Problem Relation Age of Onset  . Heart block Mother   . Heart failure Father   . Heart disease Sister   . Breast cancer Maternal Aunt     Social History Social History   Tobacco Use  .  Smoking status: Never Smoker  . Smokeless tobacco: Never Used  Substance Use Topics  . Alcohol use: No  . Drug use: No    Review of Systems Constitutional: No fever/chills Eyes: No visual changes. ENT: No sore throat. No stiff neck no neck pain Cardiovascular: Denies chest pain. Respiratory: Denies shortness of breath. Gastrointestinal:   no vomiting.  No diarrhea.  No constipation. Genitourinary: Negative for dysuria. Musculoskeletal: Negative lower extremity swelling Skin: Negative for rash. Neurological: Negative for severe headaches, focal weakness or numbness.   ____________________________________________   PHYSICAL EXAM:  VITAL SIGNS: ED Triage Vitals  Enc Vitals Group     BP 04/18/18 1626 (!) 183/64     Pulse Rate 04/18/18 1626 84     Resp 04/18/18 1626 16     Temp 04/18/18 1626 98.6 F (37 C)     Temp Source 04/18/18 1626 Oral     SpO2 04/18/18  1626 97 %     Weight 04/18/18 1628 135 lb (61.2 kg)     Height 04/18/18 1628 5\' 3"  (1.6 m)     Head Circumference --      Peak Flow --      Pain Score 04/18/18 1627 0     Pain Loc --      Pain Edu? --      Excl. in GC? --     Constitutional: Alert and oriented name place and date at this time. Well appearing and in no acute distress.  Somewhat anxious Eyes: Conjunctivae are normal Head: Atraumatic HEENT: No congestion/rhinnorhea. Mucous membranes are moist.  Oropharynx non-erythematous Neck:   Nontender with no meningismus, no masses, no stridor Cardiovascular: Normal rate, regular rhythm.  Holosystolic chronic murmur good peripheral circulation. Respiratory: Normal respiratory effort.  No retractions. Lungs CTAB. Abdominal: Soft and nontender. No distention. No guarding no rebound Back:  There is no focal tenderness or step off.  there is no midline tenderness there are no lesions noted. there is no CVA tenderness Musculoskeletal: No lower extremity tenderness, no upper extremity tenderness. No joint effusions, no DVT signs strong distal pulses no edema Neurologic:  Normal speech and language. No gross focal neurologic deficits are appreciated.  Skin:  Skin is warm, dry and intact. No rash noted. Psychiatric: Mood and affect are mood anxious. Speech and behavior are normal.  ____________________________________________   LABS (all labs ordered are listed, but only abnormal results are displayed)  Labs Reviewed  COMPREHENSIVE METABOLIC PANEL - Abnormal; Notable for the following components:      Result Value   Potassium 3.4 (*)    BUN 36 (*)    All other components within normal limits  CBC - Abnormal; Notable for the following components:   RBC 3.60 (*)    Hemoglobin 10.7 (*)    HCT 34.8 (*)    All other components within normal limits  URINALYSIS, COMPLETE (UACMP) WITH MICROSCOPIC - Abnormal; Notable for the following components:   Color, Urine YELLOW (*)    APPearance  HAZY (*)    Hgb urine dipstick SMALL (*)    Ketones, ur 5 (*)    All other components within normal limits  ACETAMINOPHEN LEVEL - Abnormal; Notable for the following components:   Acetaminophen (Tylenol), Serum <10 (*)    All other components within normal limits  URINE CULTURE  GLUCOSE, CAPILLARY  AMMONIA  ETHANOL  SALICYLATE LEVEL  TSH  CBG MONITORING, ED    Pertinent labs  results that were available during my care of  the patient were reviewed by me and considered in my medical decision making (see chart for details). ____________________________________________  EKG  I personally interpreted any EKGs ordered by me or triage  ____________________________________________  RADIOLOGY  Pertinent labs & imaging results that were available during my care of the patient were reviewed by me and considered in my medical decision making (see chart for details). If possible, patient and/or family made aware of any abnormal findings.  Ct Head Wo Contrast  Result Date: 04/18/2018 CLINICAL DATA:  Confusion. Hallucinations. EXAM: CT HEAD WITHOUT CONTRAST TECHNIQUE: Contiguous axial images were obtained from the base of the skull through the vertex without intravenous contrast. COMPARISON:  03/06/2017 FINDINGS: Brain: There is no evidence of acute infarct, intracranial hemorrhage, mass, midline shift, or extra-axial fluid collection. Mild cerebral atrophy is unchanged and not greater than expected for age. Mild cerebral white matter hypoattenuation is unchanged and nonspecific but compatible with chronic small vessel ischemic disease, also not greater than expected for age. Vascular: Calcified atherosclerosis at the skull base. No hyperdense vessel. Skull: No fracture or focal osseous lesion. Sinuses/Orbits: Bilateral cataract extraction. Clear paranasal sinuses. Chronic small left mastoid effusion. Other: None. IMPRESSION: No evidence of acute intracranial abnormality. Electronically Signed   By:  Sebastian Ache M.D.   On: 04/18/2018 18:49   Dg Chest Port 1 View  Result Date: 04/18/2018 CLINICAL DATA:  Mental status changes. EXAM: PORTABLE CHEST 1 VIEW COMPARISON:  03/21/2017 FINDINGS: 1808 hours. The lungs are clear without focal pneumonia, edema, pneumothorax or pleural effusion. Interstitial markings are diffusely coarsened with chronic features. The cardio pericardial silhouette is enlarged. Status post cardiac valve replacement. The visualized bony structures of the thorax are intact. Telemetry leads overlie the chest. IMPRESSION: Cardiomegaly with chronic interstitial coarsening. No acute findings. Electronically Signed   By: Kennith Center M.D.   On: 04/18/2018 18:42   ____________________________________________    PROCEDURES  Procedure(s) performed: None  Procedures  Critical Care performed: None  ____________________________________________   INITIAL IMPRESSION / ASSESSMENT AND PLAN / ED COURSE  Pertinent labs & imaging results that were available during my care of the patient were reviewed by me and considered in my medical decision making (see chart for details).  The history of these kind of events in the past this seems to the family be chronic I personally have seen her before when she was hallucinating attacks upon her.  Possible organic pathologies do exist for this but most likely this has to do with rapid changing of her medication.  Her blood pressure somewhat elevated but she is very anxious here.  We will watch her and see if it comes down I do not see any evidence that there is any decompensation or secondary symptoms from that.  However because of her hypertension I am scanning her head.  I will obtain chest x-ray urinalysis basic blood work and reassess closely.  ----------------------------------------- 7:50 PM on 04/18/2018 -----------------------------------------  Patient was on Zoloft until early March, then went on Paxil, was on 20 mg of Paxil until  early this week, and then it was determined that was too much because it was making her sleepy then she quit Paxil cold [place] for couple days then went back on 10 mg of Paxil and then went up to 15 of Paxil and now, on Friday, she has been having some decompensated rate but not on her to symptoms for her.  She is doing well on this facility at this time.  I think we will  keep her to the morning till he can have a psychiatrist see her and readjust her meds as needed.  She is asymptomatic we are watching her blood pressure, I think part of this is because of her anxiety about being in a different place, I am reluctant to be aggressive in bringing down by my give her something if it remains significantly elevated.  Patient home medications have been ordered by me and we are in close occasion with family who agree with all this plan.  Given how well she looks it is my hope that after a psychiatric evaluation she can go back to the facility.  ----------------------------------------- 11:31 PM on 04/18/2018 -----------------------------------------  Signed out to Dr. Manson Passey at the end of my shift    ____________________________________________   FINAL CLINICAL IMPRESSION(S) / ED DIAGNOSES  Final diagnoses:  Confusion      This chart was dictated using voice recognition software.  Despite best efforts to proofread,  errors can occur which can change meaning.      Jeanmarie Plant, MD 04/18/18 Jerene Bears    Jeanmarie Plant, MD 04/18/18 Nolen Mu    Jeanmarie Plant, MD 04/18/18 [phone]

## 2018-04-18 NOTE — ED Notes (Signed)
Pt placed on bed pan by this Clinical research associate, this tech stepped out for pt privacy and advised pt to use the call bell when finished

## 2018-04-18 NOTE — BH Assessment (Signed)
Assessment Note  Kelsey Le is an 85 y.o. female who presents to the ER due to her daughter ([phone]) and facility had concerns about current mental state and her behaviors. They noticed the changes this past Monday (04/14/2018), the patient reported she was sleeping too much. Her PCP changed her medications from Zoloft to Paxil and reduced the mg. Since the medication changes the patient sleep has decreased and today (04/18/2018) she was having AV/H of different animals in her room. Staff at the facility was also concerned about her spending more time in the bed. Patient usually get up early in the morning, make her bed, clean her room and walking around. However, the last several days she's laying in the bed and when she get out of the bed she didn't make it up.  During the interview, the patient was calm, cooperative and pleasant. At times writer had to repeat the questions because she had difficulty hearing. Patient have no history of violence or aggression. Approximately, a year ago she was [facility] for similar presentation.  Diagnosis: Unspecified Psychosis  Past Medical History:  Past Medical History:  Diagnosis Date  . Anxiety   . Asthma   . AVM (arteriovenous malformation)    a. small intestine with recurrent GI bleeding.   . B12 deficiency   . Chronic diastolic CHF (congestive heart failure) (HCC)   . Hip fracture (HCC)    a. in the setting of mechanical fall  . Iron deficiency   . Iron deficiency anemia    a. requiring periodic transfusions  . Psychosis (HCC)   . S/P TAVR (transcatheter aortic valve replacement)    a. 02/12/17: s/p TAVR w/ an Edwards Sapien 3 THV (size 23 mm, model # 9600TFX, serial # [id number])  . Severe aortic stenosis   . Syncope and collapse   . Thyroid disease     Past Surgical History:  Procedure Laterality Date  . APPENDECTOMY    . BACK SURGERY     back fusion  . CARDIAC CATHETERIZATION    .  ESOPHAGOGASTRODUODENOSCOPY (EGD) WITH PROPOFOL N/A 11/15/2015   Procedure: ESOPHAGOGASTRODUODENOSCOPY (EGD) WITH PROPOFOL;  Surgeon: [name], MD;  Location: [facility] ENDOSCOPY;  Service: Endoscopy;  Laterality: N/A;  . ESOPHAGOGASTRODUODENOSCOPY (EGD) WITH PROPOFOL N/A 10/12/2016   Procedure: ESOPHAGOGASTRODUODENOSCOPY (EGD) WITH PROPOFOL;  Surgeon: [name], MD;  Location: [facility] ENDOSCOPY;  Service: Endoscopy;  Laterality: N/A;  . FEMUR SURGERY     broken with rod  . FRACTURE SURGERY    . GIVENS CAPSULE STUDY N/A 02/20/2016   Procedure: GIVENS CAPSULE STUDY;  Surgeon: Scot Jun, MD;  Location: [facility] ENDOSCOPY;  Service: Endoscopy;  Laterality: N/A;  . HIP FRACTURE SURGERY    . INTRAMEDULLARY (IM) NAIL INTERTROCHANTERIC Right 11/18/2015   Procedure: INTRAMEDULLARY (IM) NAIL INTERTROCHANTRIC;  Surgeon: Juanell Fairly, MD;  Location: [facility] ORS;  Service: Orthopedics;  Laterality: Right;  . KYPHOPLASTY N/A 08/19/2014   Procedure: KYPHOPLASTY;  Surgeon: Kennedy Bucker, MD;  Location: [facility] ORS;  Service: Orthopedics;  Laterality: N/A;  . PARTIAL HYSTERECTOMY    . RIGHT/LEFT HEART CATH AND CORONARY ANGIOGRAPHY N/A 10/05/2016   Procedure: RIGHT/LEFT HEART CATH AND CORONARY ANGIOGRAPHY;  Surgeon: Kathleene Hazel, MD;  Location: [facility];  Service: Cardiovascular;  Laterality: N/A;  . TEE WITHOUT CARDIOVERSION N/A 02/12/2017   Procedure: TRANSESOPHAGEAL ECHOCARDIOGRAM (TEE);  Surgeon: Kathleene Hazel, MD;  Location: [facility] OR;  Service: Open Heart Surgery;  Laterality: N/A;  . TRANSCATHETER AORTIC VALVE REPLACEMENT,  TRANSFEMORAL N/A 02/12/2017   Procedure: TRANSCATHETER AORTIC VALVE REPLACEMENT, TRANSFEMORAL using a 23mm Edwards Sapien 3 Aortic Valve;  Surgeon: Kathleene HazelMcAlhany, Christopher D, MD;  Location: [facility] OR;  Service: Open Heart Surgery;  Laterality: N/A;  . TUBAL LIGATION      Family History:  Family History  Problem Relation Age of Onset  . Heart block Mother   . Heart  failure Father   . Heart disease Sister   . Breast cancer Maternal Aunt     Social History:  reports that she has never smoked. She has never used smokeless tobacco. She reports that she does not drink alcohol or use drugs.  Additional Social History:  Alcohol / Drug Use Pain Medications: See PTA Prescriptions: See PTA Over the Counter: See PTA History of alcohol / drug use?: No history of alcohol / drug abuse Longest period of sobriety (when/how long): Reports of none Negative Consequences of Use: (n/a) Withdrawal Symptoms: (n/a)  CIWA: CIWA-Ar BP: (!) 197/61 Pulse Rate: 64 COWS:    Allergies:  Allergies  Allergen Reactions  . Aspirin Other (See Comments)    GI bleeding   . Penicillins Rash and Other (See Comments)    PATIENT HAS HAD A PCN REACTION WITH IMMEDIATE RASH, FACIAL/TONGUE/THROAT SWELLING, SOB, OR LIGHTHEADEDNESS WITH HYPOTENSION:  #  #  #  YES  #  #  #   Has patient had a PCN reaction causing severe rash involving mucus membranes or skin necrosis: No Has patient had a PCN reaction that required hospitalization: No Has patient had a PCN reaction occurring within the last 10 years: No     Home Medications: (Not in a hospital admission)   OB/GYN Status:  No LMP recorded. Patient has had a hysterectomy.  General Assessment Data [facility] Assessment: In system Is this a Tele or Face-to-Face Assessment?: Face-to-Face Is this an Initial Assessment or a Re-assessment for this encounter?: Initial Assessment Language Other than English: No Living Arrangements: Other (Comment) What gender do you identify as?: Female Pregnancy Status: No Living Arrangements: Other (Comment)(Facility ) Can pt return to current living arrangement?: Yes Admission Status: Voluntary Is patient capable of signing voluntary admission?: Yes Referral Source: Self/Family/Friend Insurance type: MCR A&B  Medical Screening Exam [facility]-in ONLY) Medical Exam  completed: No Reason for MSE not completed: Other:(Pending labs)  Crisis Care Plan Living Arrangements: Other (Comment)(Facility ) Legal Guardian: Other:(None) Name of Psychiatrist: Reports of none Name of Therapist: Reports of none  Education Status Is patient currently in school?: No Is the patient employed, unemployed or receiving disability?: Unemployed  Risk to self with the past 6 months Suicidal Ideation: No Has patient been a risk to self within the past 6 months prior to admission? : No Suicidal Intent: No Has patient had any suicidal intent within the past 6 months prior to admission? : No Is patient at risk for suicide?: No Suicidal Plan?: No Has patient had any suicidal plan within the past 6 months prior to admission? : No Access to Means: No What has been your use of drugs/alcohol within the last 12 months?: Reports of none Previous Attempts/Gestures: No How many times?: 0 Other Self Harm Risks: n/a Triggers for Past Attempts: None known Intentional Self Injurious Behavior: None Family Suicide History: No Recent stressful life event(s): Other (Comment)(Med changes) Persecutory voices/beliefs?: No Depression: No Depression Symptoms: Insomnia Substance abuse history and/or treatment for substance abuse?: No Suicide prevention information given to non-admitted patients: Not applicable  Risk  to Others within the past 6 months Homicidal Ideation: No Does patient have any lifetime risk of violence toward others beyond the six months prior to admission? : No Thoughts of Harm to Others: No Current Homicidal Intent: No Current Homicidal Plan: No Access to Homicidal Means: No Identified Victim: Reports of none History of harm to others?: No Assessment of Violence: None Noted Violent Behavior Description: Reports of none Does patient have access to weapons?: No Criminal Charges Pending?: No Does patient have a court date: No Is patient on probation?:  No  Psychosis Hallucinations: Auditory, Visual(Per daughter) Delusions: None noted  Mental Status Report Appearance/Hygiene: Unremarkable, In scrubs Eye Contact: Fair Motor Activity: Unable to assess(Patient laying in the bed) Speech: Soft, Logical/coherent, Unremarkable Level of Consciousness: Alert Mood: Anxious, Pleasant Affect: Appropriate to circumstance Anxiety Level: Minimal Thought Processes: Coherent, Relevant Judgement: Partial Orientation: Person, Place, Time, Situation, Appropriate for developmental age Obsessive Compulsive Thoughts/Behaviors: None  Cognitive Functioning Concentration: Normal Memory: Recent Intact, Remote Intact Is patient IDD: No Insight: Fair Impulse Control: Fair Appetite: Fair Have you had any weight changes? : No Change Sleep: Decreased(Per the daughter) Total Hours of Sleep: 4 Vegetative Symptoms: None  ADLScreening [facility]) Patient's cognitive ability adequate to safely complete daily activities?: Yes Patient able to express need for assistance with ADLs?: Yes Independently performs ADLs?: Yes (appropriate for developmental age)  Prior Inpatient Therapy Prior Inpatient Therapy: Yes Prior Therapy Dates: 2019 Prior Therapy Facilty/Provider(s): [facility]  Prior Outpatient Therapy Prior Outpatient Therapy: No(Receiving med management from PCP) Does patient have an ACCT team?: No Does patient have Intensive In-House Services?  : No Does patient have [facility] services? : No Does patient have P4CC services?: No  ADL Screening (condition at time of admission) Patient's cognitive ability adequate to safely complete daily activities?: Yes Is the patient deaf or have difficulty hearing?: No Does the patient have difficulty seeing, even when wearing glasses/contacts?: No Does the patient have difficulty concentrating, remembering, or making decisions?: No Patient able to express need for assistance with ADLs?:  Yes Does the patient have difficulty dressing or bathing?: No Independently performs ADLs?: Yes (appropriate for developmental age) Does the patient have difficulty walking or climbing stairs?: No Weakness of Legs: None Weakness of Arms/Hands: None  Home Assistive Devices/Equipment Home Assistive Devices/Equipment: None  Therapy Consults (therapy consults require a physician order) PT Evaluation Needed: No OT Evalulation Needed: No SLP Evaluation Needed: No Abuse/Neglect Assessment (Assessment to be complete while patient is alone) Abuse/Neglect Assessment Can Be Completed: Yes Physical Abuse: Denies Verbal Abuse: Denies Sexual Abuse: Denies Exploitation of patient/patient's resources: Denies Self-Neglect: Denies Values / Beliefs Cultural Requests During Hospitalization: None Spiritual Requests During Hospitalization: None Consults Spiritual Care Consult Needed: No Social Work Consult Needed: No Merchant navy officer (For Healthcare) Does Patient Have a Medical Advance Directive?: Yes Type of Advance Directive: Living will Copy of Living Will in Chart?: No - copy requested Would patient like information on creating a medical advance directive?: No - Guardian declined       Child/Adolescent Assessment Running Away Risk: Denies(Patient is an adult)  Disposition:  Disposition Initial Assessment Completed for this Encounter: Yes  On Site Evaluation by:   Reviewed with Physician:    Lilyan Gilford MS, LCAS, LPMHCA, NCC, CCSI Therapeutic Triage Specialist 04/18/2018 6:17 PM

## 2018-04-18 NOTE — ED Notes (Signed)
EDP [place] verb to give pt food tray/drink. Pt given food & drink.

## 2018-04-19 DIAGNOSIS — F29 Unspecified psychosis not due to a substance or known physiological condition: Secondary | ICD-10-CM | POA: Diagnosis not present

## 2018-04-19 DIAGNOSIS — F0281 Dementia in other diseases classified elsewhere with behavioral disturbance: Secondary | ICD-10-CM | POA: Diagnosis not present

## 2018-04-19 MED ORDER — PAROXETINE HCL 10 MG PO TABS: 10.0000 mg | ORAL_TABLET | Freq: Every evening | ORAL | Status: DC

## 2018-04-19 MED ORDER — RISPERIDONE 0.25 MG PO TABS
0.2500 mg | ORAL_TABLET | Freq: Three times a day (TID) | ORAL | Status: DC | PRN
  Filled 2018-04-19: qty 1

## 2018-04-19 MED ORDER — SERTRALINE HCL 50 MG PO TABS
50.0000 mg | ORAL_TABLET | Freq: Every day | ORAL | Status: DC
  Administered 2018-04-20 – 2018-04-21 (×2): 50 mg via ORAL
  Filled 2018-04-19 (×2): qty 1

## 2018-04-19 MED ORDER — LORAZEPAM 2 MG/ML IJ SOLN
1.0000 mg | Freq: Once | INTRAMUSCULAR | Status: AC
  Administered 2018-04-19: 1 mg via INTRAVENOUS
  Filled 2018-04-19: qty 1

## 2018-04-19 MED ORDER — PAROXETINE HCL 30 MG PO TABS: 15.0000 mg | ORAL_TABLET | Freq: Every day | ORAL | Status: DC

## 2018-04-19 NOTE — ED Notes (Signed)
Pt continues to sleep.

## 2018-04-19 NOTE — Consult Note (Addendum)
[facility] Psych ED Progress Note  04/19/2018 11:22 AM Kelsey Le  MRN:  [id number]   Reason for consultation: Psychiatric evaluation and recommendations regarding medications  Subjective  Chief complaint:"I've been talking so much"  HPI: Patient is a 85 year old Caucasian female resented to the emergency department with several days of disturbed behavior, poor sleep, reported visual and auditory hallucinations while at her nursing facility.  He is accompanied by her daughter.  Her daughter reports that the patient was recently changed from Zoloft to Paxil, reportedly for depression.  Since her medication change the patient has been reportedly somnolent, and when she started refusing her medications she became somewhat agitated, increasingly confused, and developed sleep disturbance.  The daughter is currently upset that the patient was given 2 doses of Paxil within a 24-hour period in the emergency department.  She noted the patient was misinterpreting items in her room as "bugs" last night.   Reviewing her native history with her daughter detailed that the patient had mild cognitive issues prior to her cardiac surgery last February, with first presentation of behavioral disturbances (paranoia, auditory hallucinations) secondary to dementia in March 2019.  Difficult to separate if the patient was delirious at that time, or may have an underlying Lewy body etiology to her dementia.  On interview the patient was alert and oriented to herself, place and somewhat to her situation, along with month and year.  She had difficulty with sustained attention, and was unable to complete marching through the months backwards.  Distally she became distracted when naming categorical items ( five, 4-legged animals in 60 seconds). No difficulty in naming, recall was intact.  She then spent the majority of her interview derailing into content related to her family and her childhood experiences.  Otherwise she appeared to  be pleasantly confused, majority of her HPI had to be gathered from her daughter.  Principal Problem: Delirium vs Behavioral Disturbances related to Dementia  Diagnosis: Delirium Major neurocognitive disorder, mixed etiology CHF Aortic stenosis status post TAVR Thyroid disease   Total Time spent with patient: 1 hour  Past Psychiatric History: Past history of inpatient geriatric admission in 03/2017, at [facility]  outpatient medications of Risperdal and Paxil, previously on Zoloft and Risperdal.  Outpatient provider is Dr. Judithann Sheen?  Past Medical History:  Past Medical History:  Diagnosis Date  . Anxiety   . Asthma   . AVM (arteriovenous malformation)    a. small intestine with recurrent GI bleeding.   . B12 deficiency   . Chronic diastolic CHF (congestive heart failure) (HCC)   . Hip fracture (HCC)    a. in the setting of mechanical fall  . Iron deficiency   . Iron deficiency anemia    a. requiring periodic transfusions  . Psychosis (HCC)   . S/P TAVR (transcatheter aortic valve replacement)    a. 02/12/17: s/p TAVR w/ an Edwards Sapien 3 THV (size 23 mm, model # 9600TFX, serial # [id number])  . Severe aortic stenosis   . Syncope and collapse   . Thyroid disease     Past Surgical History:  Procedure Laterality Date  . APPENDECTOMY    . BACK SURGERY     back fusion  . CARDIAC CATHETERIZATION    . ESOPHAGOGASTRODUODENOSCOPY (EGD) WITH PROPOFOL N/A 11/15/2015   Procedure: ESOPHAGOGASTRODUODENOSCOPY (EGD) WITH PROPOFOL;  Surgeon: [name], MD;  Location: [facility] ENDOSCOPY;  Service: Endoscopy;  Laterality: N/A;  . ESOPHAGOGASTRODUODENOSCOPY (EGD) WITH PROPOFOL N/A 10/12/2016   Procedure: ESOPHAGOGASTRODUODENOSCOPY (EGD)  WITH PROPOFOL;  Surgeon: [name], MD;  Location[facility] ENDOSCOPY;  Service: Endoscopy;  Laterality: N/A;  . FEMUR SURGERY     broken with rod  . FRACTURE SURGERY    . GIVENS CAPSULE STUDY N/A 02/20/2016   Procedure: GIVENS  CAPSULE STUDY;  Surgeon: Scot Jun, MD;  Location: [facility] ENDOSCOPY;  Service: Endoscopy;  Laterality: N/A;  . HIP FRACTURE SURGERY    . INTRAMEDULLARY (IM) NAIL INTERTROCHANTERIC Right 11/18/2015   Procedure: INTRAMEDULLARY (IM) NAIL INTERTROCHANTRIC;  Surgeon: Juanell Fairly, MD;  Location: [facility] ORS;  Service: Orthopedics;  Laterality: Right;  . KYPHOPLASTY N/A 08/19/2014   Procedure: KYPHOPLASTY;  Surgeon: Kennedy Bucker, MD;  Location: [facility] ORS;  Service: Orthopedics;  Laterality: N/A;  . PARTIAL HYSTERECTOMY    . RIGHT/LEFT HEART CATH AND CORONARY ANGIOGRAPHY N/A 10/05/2016   Procedure: RIGHT/LEFT HEART CATH AND CORONARY ANGIOGRAPHY;  Surgeon: Kathleene Hazel, MD;  Location: [facility] INVASIVE CV LAB;  Service: Cardiovascular;  Laterality: N/A;  . TEE WITHOUT CARDIOVERSION N/A 02/12/2017   Procedure: TRANSESOPHAGEAL ECHOCARDIOGRAM (TEE);  Surgeon: Kathleene Hazel, MD;  Location: [facility] OR;  Service: Open Heart Surgery;  Laterality: N/A;  . TRANSCATHETER AORTIC VALVE REPLACEMENT, TRANSFEMORAL N/A 02/12/2017   Procedure: TRANSCATHETER AORTIC VALVE REPLACEMENT, TRANSFEMORAL using a 23mm Edwards Sapien 3 Aortic Valve;  Surgeon: Kathleene Hazel, MD;  Location: [facility] OR;  Service: Open Heart Surgery;  Laterality: N/A;  . TUBAL LIGATION     Family History:  Family History  Problem Relation Age of Onset  . Heart block Mother   . Heart failure Father   . Heart disease Sister   . Breast cancer Maternal Aunt    Family Psychiatric  History: Mother has history of schizophrenia Social History:  Social History   Substance and Sexual Activity  Alcohol Use No     Social History   Substance and Sexual Activity  Drug Use No    Social History   Socioeconomic History  . Marital status: Married    Spouse name: Not on file  . Number of children: Not on file  . Years of education: Not on file  . Highest education level: Not on file  Occupational History  . Not on file  Social  Needs  . Financial resource strain: Not on file  . Food insecurity:    Worry: Not on file    Inability: Not on file  . Transportation needs:    Medical: Not on file    Non-medical: Not on file  Tobacco Use  . Smoking status: Never Smoker  . Smokeless tobacco: Never Used  Substance and Sexual Activity  . Alcohol use: No  . Drug use: No  . Sexual activity: Not Currently  Lifestyle  . Physical activity:    Days per week: Not on file    Minutes per session: Not on file  . Stress: Not on file  Relationships  . Social connections:    Talks on phone: Not on file    Gets together: Not on file    Attends religious service: Not on file    Active member of club or organization: Not on file    Attends meetings of clubs or organizations: Not on file    Relationship status: Not on file  Other Topics Concern  . Not on file  Social History Narrative  . Not on file    Sleep: Poor  Appetite:  Fair  Current Medications: Current Facility-Administered Medications  Medication Dose Route Frequency Provider Last  Rate Last Dose  . furosemide (LASIX) tablet 20 mg  20 mg Oral Daily Jeanmarie Plant, MD   20 mg at 04/19/18 1010  . levothyroxine (SYNTHROID) tablet 50 mcg  50 mcg Oral QAC breakfast Jeanmarie Plant, MD   50 mcg at 04/19/18 0825  . [START ON 04/20/2018] PARoxetine (PAXIL) tablet 10 mg  10 mg Oral QPM Dionne Bucy, MD      . polyvinyl alcohol (LIQUIFILM TEARS) 1.4 % ophthalmic solution 1 drop  1 drop Both Eyes PRN Jeanmarie Plant, MD      . risperiDONE (RISPERDAL) tablet 0.5 mg  0.5 mg Oral BID Jeanmarie Plant, MD   0.5 mg at 04/19/18 1010  . sodium chloride flush (NS) 0.9 % injection 3 mL  3 mL Intravenous Once Jeanmarie Plant, MD       Current Outpatient Medications  Medication Sig Dispense Refill  . Calcium Carbonate-Vitamin D (CALCIUM-VITAMIN D) 600-125 MG-UNIT TABS Take by mouth daily.    . iron polysaccharides (NIFEREX) 150 MG capsule Take 150 mg by mouth daily.     Marland Kitchen levothyroxine (SYNTHROID, LEVOTHROID) 50 MCG tablet Take 50 mcg by mouth daily before breakfast.     . loratadine (CLARITIN) 10 MG tablet Take 10 mg by mouth daily.     . memantine (NAMENDA) 10 MG tablet Take 10 mg by mouth 2 (two) times daily.   0  . omeprazole (PRILOSEC) 20 MG capsule Take 1 capsule (20 mg total) by mouth daily. 90 capsule 3  . PARoxetine (PAXIL) 10 MG tablet Take 15 mg by mouth every evening.     . risperiDONE (RISPERDAL) 0.25 MG tablet Take 0.25 mg by mouth 2 (two) times daily.    . vitamin B-12 (CYANOCOBALAMIN) 1000 MCG tablet Take 1,000 mcg by mouth daily.    Marland Kitchen acetaminophen (TYLENOL) 325 MG tablet Take 650 mg by mouth every 6 (six) hours as needed for moderate pain or headache.     . clindamycin (CLEOCIN) 150 MG capsule Take 6,000 mg by mouth as directed. 1 hour before dental procedures  0  . clobetasol cream (TEMOVATE) 0.05 % Apply 1 application topically See admin instructions. Apply small amount vaginally on Monday and Friday    . estradiol (ESTRACE) 0.1 MG/GM vaginal cream Place 1 Applicatorful vaginally See admin instructions. Apply 0.5 grams vaginally Tuesday and Thursday morning    . furosemide (LASIX) 20 MG tablet Take 1 tablet (20 mg total) by mouth daily. 5 tablet 0  . polyvinyl alcohol (LIQUIFILM TEARS) 1.4 % ophthalmic solution Place 1 drop into both eyes as needed for dry eyes.      Lab Results:  Results for orders placed or performed during the hospital encounter of 04/18/18 (from the past 48 hour(s))  Glucose, capillary     Status: None   Collection Time: 04/18/18  4:44 PM  Result Value Ref Range   Glucose-Capillary 91 70 - 99 mg/dL  Comprehensive metabolic panel     Status: Abnormal   Collection Time: 04/18/18  5:43 PM  Result Value Ref Range   Sodium 143 135 - 145 mmol/L   Potassium 3.4 (L) 3.5 - 5.1 mmol/L   Chloride 111 98 - 111 mmol/L   CO2 22 22 - 32 mmol/L   Glucose, Bld 94 70 - 99 mg/dL   BUN 36 (H) 8 - 23 mg/dL   Creatinine, Ser 2.13  0.44 - 1.00 mg/dL   Calcium 9.3 8.9 - 08.6 mg/dL   Total Protein 7.5  6.5 - 8.1 g/dL   Albumin 4.4 3.5 - 5.0 g/dL   AST 26 15 - 41 U/L   ALT 15 0 - 44 U/L   Alkaline Phosphatase 72 38 - 126 U/L   Total Bilirubin 0.7 0.3 - 1.2 mg/dL   GFR calc non Af Amer >60 >60 mL/min   GFR calc Af Amer >60 >60 mL/min   Anion gap 10 5 - 15    Comment: Performed at [facility], [address] [place], [place] [address]  CBC     Status: Abnormal   Collection Time: 04/18/18  5:43 PM  Result Value Ref Range   WBC 5.0 4.0 - 10.5 K/uL   RBC 3.60 (L) 3.87 - 5.11 MIL/uL   Hemoglobin 10.7 (L) 12.0 - 15.0 g/dL   HCT 11.934.8 (L) 14.736.0 - 82.946.0 %   MCV 96.7 80.0 - 100.0 fL   MCH 29.7 26.0 - 34.0 pg   MCHC 30.7 30.0 - 36.0 g/dL   RDW 56.214.5 13.011.5 - 86.515.5 %   Platelets 153 150 - 400 K/uL   nRBC 0.0 0.0 - 0.2 %    Comment: Performed at [facility], [address] [place], [place] [address]  Urinalysis, Complete w Microscopic     Status: Abnormal   Collection Time: 04/18/18  5:43 PM  Result Value Ref Range   Color, Urine YELLOW (A) YELLOW   APPearance HAZY (A) CLEAR   Specific Gravity, Urine 1.025 1.005 - 1.030   pH 5.0 5.0 - 8.0   Glucose, UA NEGATIVE NEGATIVE mg/dL   Hgb urine dipstick SMALL (A) NEGATIVE   Bilirubin Urine NEGATIVE NEGATIVE   Ketones, ur 5 (A) NEGATIVE mg/dL   Protein, ur NEGATIVE NEGATIVE mg/dL   Nitrite NEGATIVE NEGATIVE   Leukocytes,Ua NEGATIVE NEGATIVE   RBC / HPF 0-5 0 - 5 RBC/hpf   WBC, UA 0-5 0 - 5 WBC/hpf   Bacteria, UA NONE SEEN NONE SEEN   Squamous Epithelial / LPF 0-5 0 - 5    Comment: Performed at [facility], [address] [place], [place] [address]  Ammonia     Status: None   Collection Time: 04/18/18  5:43 PM  Result Value Ref Range   Ammonia 14 9 - 35 umol/L    Comment: Performed at [facility], [address] [place], [place] [address]  Ethanol     Status: None   Collection Time: 04/18/18  5:43 PM  Result Value Ref Range    Alcohol, Ethyl (B) <10 <10 mg/dL    Comment: (NOTE) Lowest detectable limit for serum alcohol is 10 mg/dL. For medical purposes only. Performed at [facility], [address] [place], [place] [address]   Acetaminophen level     Status: Abnormal   Collection Time: 04/18/18  5:43 PM  Result Value Ref Range   Acetaminophen (Tylenol), Serum <10 (L) 10 - 30 ug/mL    Comment: (NOTE) Therapeutic concentrations vary significantly. A range of 10-30 ug/mL  may be an effective concentration for many patients. However, some  are best treated at concentrations outside of this range. Acetaminophen concentrations >150 ug/mL at 4 hours after ingestion  and >50 ug/mL at 12 hours after ingestion are often associated with  toxic reactions. Performed at [facility], [address] [place], [place] [address]   Salicylate level     Status: None   Collection Time: 04/18/18  5:43 PM  Result Value Ref Range   Salicylate Lvl <7.0  2.8 - 30.0 mg/dL    Comment: Performed at [facility], [address] [place], [place] [address]  TSH     Status: None   Collection Time: 04/18/18  6:14 PM  Result Value Ref Range   TSH 1.588 0.350 - 4.500 uIU/mL    Comment: Performed by a 3rd Generation assay with a functional sensitivity of <=0.01 uIU/mL. Performed at [facility], [address] [place], [place] [address]     Blood Alcohol level:  Lab Results  Component Value Date   [facility] <10 04/18/2018   ETH <10 03/19/2017    Physical Findings: AIMS:  , ,  ,  ,    CIWA:    COWS:     Musculoskeletal: Strength & Muscle Tone: decreased Gait & Station: unable to assess Patient leans: N/A  Psychiatric Specialty Exam: Physical Exam  Constitutional: She appears well-developed.  HENT:  Head: Normocephalic and atraumatic.  Respiratory: Effort normal.    ROS  Blood pressure (!) 164/61, pulse 61, temperature 98.6 F (37 C), temperature source Oral, resp. rate 16,  height 5\' 3"  (1.6 m), weight 61.2 kg, SpO2 94 %.Body mass index is 23.91 kg/m.  General Appearance: Fairly Groomed  Eye Contact:  Fair  Speech:  rambling, clear  Volume:  Decreased  Mood:  "Just fine"  Affect:  Pleasant, confused  Thought Process:  Descriptions of Associations: Tangential  Orientation:  Other:  Person place  Thought Content:  Noted some paranoia, and recent visual and  Suicidal Thoughts:  No  Homicidal Thoughts:  No  Memory:  Immediate;   Poor Recent;   Poor Remote;   Fair  Judgement:  Impaired  Insight:  Lacking  Psychomotor Activity:  Normal  Concentration:  Concentration: Poor  Recall:  [employer] of Knowledge:  Fair  Language:  Fair  Akathisia:  No  Handed:  Right  AIMS (if indicated):     Assets:  Desire for Improvement Housing Physical Health Social Support  ADL's:  Impaired  Cognition:  Impaired,  Mild and Moderate  Sleep:      Assessment:  Considering the patient's recent history involving acute changes to her mental status is most likely delirium on top of underlying.  Major Neurocognitive disorder, mixed etiology (vascular? LBD versus Alzheimer's).  She was recently changed onto Paxil which she appears to be tolerating poorly, potentially secondary to the anticholinergic properties related to Paxil.  Would recommend that we change her back to Zoloft, but would need to wait till tomorrow morning.  Would also recommend starting melatonin to aid with sleep initiation while in the hospital, would utilize low-dose Risperdal as needed for paranoia/agitation related to her dementia while she is in the ED.  Should she tolerate her medication changes and return to her baseline within the next day or two she may return to her previous nursing home.   Treatment Plan Summary: Daily contact with patient to assess and evaluate symptoms and progress in treatment, Medication management and Plan As follows   Delirium/ MNCD: Would recommend observation for 24  hours as she received multiple doses of paxil, which may be related to her behavioral disturbances prior to presentation at the ED  Zoloft 50mg  po Q daily starting tomorrow (04/20/18)     Would recommend melatonin 6 mg p.o. nightly unable to find it in the order set  Will add Risperdal 0.25 mg p.o. 3 times daily as needed for agitation  Please avoid benzodiazepines, anticholinergic, high dose antipsychotic medications in  this patient   Luciano Cutter, DO 04/19/2018, 11:22 AM

## 2018-04-19 NOTE — ED Notes (Signed)
Pt resting in bed watching hockey; no complaints or requests

## 2018-04-19 NOTE — ED Notes (Signed)
In to talk with pt, she is trying to find something to watch on tv; assisted with remote; found a hockey game for pt; volume down at her request, says she doesn't want to disturb anyone; no complaints or requests

## 2018-04-19 NOTE — ED Notes (Signed)
Family no longer at bedside. Pt resting comfortably.

## 2018-04-19 NOTE — ED Notes (Signed)
Daughter at bedside.

## 2018-04-19 NOTE — ED Notes (Signed)
Pt put on bed pan by this tech, Alyssa EDT and [employer]. Pericare provided and pt repositioned in bed. Warm blanket given

## 2018-04-19 NOTE — ED Notes (Signed)
Both hearing aids at bedside when this RN left. One hearing aid in ear for TV and the other in the cup at bedside.

## 2018-04-19 NOTE — ED Notes (Addendum)
Upon administration of medication, daughter informed this RN that paxil is only to be given at night time. Charge RN and Dr. Marisa Severin informed of change in medication. Psychiatrist updated and says pt to be monitored through this evening for any changes. Orders changed to match [facility] orders. Daughter agreeable to pt being watched through tonight.

## 2018-04-19 NOTE — ED Notes (Signed)
Pt denies any needs. Calm. Lights dim. Door remains open. Rails up. Bed in lowest position. Pt keeps removing sat monitor by accident while asleep.

## 2018-04-19 NOTE — ED Notes (Signed)
Pt still resting in bed, has switched the channel; no complaints or requests

## 2018-04-19 NOTE — ED Notes (Signed)
Pt becoming combative towards staff, swinging her fists in the direction of this RN and ED tech Alyssa; pt believes she is being recorded through her hearing aid and bit it in half as we were trying to get it from her to turn it off; broken hearing aid placed in sterile container and placed on bedside table with other hearing aid in separate container

## 2018-04-19 NOTE — ED Notes (Signed)
Pt sleeping comfortably.

## 2018-04-19 NOTE — ED Notes (Signed)
Pt resting in bed; daughter left prior to my arrival and pt is alone; side rails up for safety;

## 2018-04-19 NOTE — ED Notes (Signed)
Psychiatrist at bedside

## 2018-04-19 NOTE — ED Notes (Signed)
In to give pt her Risperidone; pt is angry; talking about her daughter and son, and people talking about her; says she isn't going to say anything to the police, she hasn't killed anyone or done anything illegal; accuses this RN of talking to her son and daughter and telling them thinks that aren't true; this RN has never talked to pt's family members; pt refuses to take any medication

## 2018-04-19 NOTE — ED Notes (Signed)
Pt given meal tray.

## 2018-04-19 NOTE — ED Notes (Signed)
Meal tray given to pt.

## 2018-04-20 ENCOUNTER — Emergency Department: Payer: Medicare Other

## 2018-04-20 DIAGNOSIS — F0281 Dementia in other diseases classified elsewhere with behavioral disturbance: Secondary | ICD-10-CM | POA: Diagnosis not present

## 2018-04-20 DIAGNOSIS — F29 Unspecified psychosis not due to a substance or known physiological condition: Secondary | ICD-10-CM | POA: Diagnosis not present

## 2018-04-20 MED ORDER — ACETAMINOPHEN 325 MG PO TABS
650.0000 mg | ORAL_TABLET | Freq: Once | ORAL | Status: AC
  Administered 2018-04-20: 650 mg via ORAL
  Filled 2018-04-20: qty 2

## 2018-04-20 NOTE — ED Notes (Signed)
Pt resting quietly with eyes closed

## 2018-04-20 NOTE — ED Notes (Signed)
Pt reports that she feels "like I don't have any strength today." Pt sipping coffee, eyes close periodically during conversation but open easily when asked a question (lthough pt is [facility] and as soon as she is touched, she is alert). Pt took medications with no difficulty or resistance. P

## 2018-04-20 NOTE — ED Notes (Signed)
Pt continues to rest quietly with eyes closed and resp even/unlabored

## 2018-04-20 NOTE — ED Notes (Signed)
Patient's oxygen saturation increased to 4L [place]

## 2018-04-20 NOTE — BH Assessment (Signed)
Chest X-Ray and EKG faxed to [facility] per their request.

## 2018-04-20 NOTE — ED Notes (Signed)
Pt given ginger ale; hearing aid screwed back in and both inserted in pt's ear. She verbalized that they are working and comfortable.

## 2018-04-20 NOTE — ED Notes (Signed)
Eyes closed, resp even and unlabored 

## 2018-04-20 NOTE — Consult Note (Signed)
[facility] Psych ED Progress Note  04/20/2018 12:54 PM ELISABETH STROM  MRN:  [id number]   Reason for consultation: Psychiatric evaluation and recommendations regarding medications  Subjective  Chief complaint:" I am feeling so tired"  HPI: The patient is a 85 year old Caucasian female who presented to the emergency department with several days of disturbed behavior, poor sleep, reported visual and auditory hallucinations while at her nursing facility. Yesterday afternoon she became paranoid and argumentative towards her daughter (per daughters report). She did not receive her nighttime dose of Risperdal.  She is no PRN antipsychotics.  He received as needed Ativan 1 mg, last night after becoming agitated and attempting to chew apart her hearing aid and climb out of the bed.   On interview the patient was alert and oriented only to herself. She demonstrated great difficulty with sustained attention and was unable to complete cognitive testing and would perseverate on feeling tired. She appears to have no awareness of her behaviors from last night or yesterday afternoon. Otherwise she appeared to be pleasantly confused during the interview, she denied symptoms of serotonin withdrawal, but is unclear that she understood the questions being asked.  Her daughter, Annia Belt, was contacted at [phone] and informed that the patient had a difficult night.  She was in agreement that the patient would benefit from a inpatient psychiatric admission for the adjustment of her medications related to her recent behavioral disturbances, which appear to be tied to her major neurocognitive disorder.  Considering the patient does not have a capacity to consent to voluntary admission, will need to IVC the patient.  She would be most appropriate for geriatric psychiatric inpatient unit  Principal Problem: Behavioral Disturbances related to Dementia  Diagnosis: Major neurocognitive disorder, mixed etiology with  behavioral disturbances CHF Aortic stenosis status post TAVR Thyroid disease R/O delirium  Total Time spent with patient: 30 minutes  Past Psychiatric History: Past history of inpatient geriatric admission in 03/2017, at [facility].  outpatient medications of Risperdal and Paxil, previously on Zoloft and Risperdal.  Outpatient provider is Dr. Judithann Sheen?  Past Medical History:  Past Medical History:  Diagnosis Date  . Anxiety   . Asthma   . AVM (arteriovenous malformation)    a. small intestine with recurrent GI bleeding.   . B12 deficiency   . Chronic diastolic CHF (congestive heart failure) (HCC)   . Hip fracture (HCC)    a. in the setting of mechanical fall  . Iron deficiency   . Iron deficiency anemia    a. requiring periodic transfusions  . Psychosis (HCC)   . S/P TAVR (transcatheter aortic valve replacement)    a. 02/12/17: s/p TAVR w/ an Edwards Sapien 3 THV (size 23 mm, model # 9600TFX, serial # [id number])  . Severe aortic stenosis   . Syncope and collapse   . Thyroid disease     Past Surgical History:  Procedure Laterality Date  . APPENDECTOMY    . BACK SURGERY     back fusion  . CARDIAC CATHETERIZATION    . ESOPHAGOGASTRODUODENOSCOPY (EGD) WITH PROPOFOL N/A 11/15/2015   Procedure: ESOPHAGOGASTRODUODENOSCOPY (EGD) WITH PROPOFOL;  Surgeon: [name], MD;  Location: [facility] ENDOSCOPY;  Service: Endoscopy;  Laterality: N/A;  . ESOPHAGOGASTRODUODENOSCOPY (EGD) WITH PROPOFOL N/A 10/12/2016   Procedure: ESOPHAGOGASTRODUODENOSCOPY (EGD) WITH PROPOFOL;  Surgeon: [name], MD;  Location: [facility] ENDOSCOPY;  Service: Endoscopy;  Laterality: N/A;  . FEMUR SURGERY     broken with rod  . FRACTURE SURGERY    .  GIVENS CAPSULE STUDY N/A 02/20/2016   Procedure: GIVENS CAPSULE STUDY;  Surgeon: Scot Jun, MD;  Location: [facility] ENDOSCOPY;  Service: Endoscopy;  Laterality: N/A;  . HIP FRACTURE SURGERY    . INTRAMEDULLARY (IM) NAIL INTERTROCHANTERIC Right  11/18/2015   Procedure: INTRAMEDULLARY (IM) NAIL INTERTROCHANTRIC;  Surgeon: Juanell Fairly, MD;  Location: [facility] ORS;  Service: Orthopedics;  Laterality: Right;  . KYPHOPLASTY N/A 08/19/2014   Procedure: KYPHOPLASTY;  Surgeon: Kennedy Bucker, MD;  Location: [facility] ORS;  Service: Orthopedics;  Laterality: N/A;  . PARTIAL HYSTERECTOMY    . RIGHT/LEFT HEART CATH AND CORONARY ANGIOGRAPHY N/A 10/05/2016   Procedure: RIGHT/LEFT HEART CATH AND CORONARY ANGIOGRAPHY;  Surgeon: Kathleene Hazel, MD;  [facility] INVASIVE CV LAB;  Service: Cardiovascular;  Laterality: N/A;  . TEE WITHOUT CARDIOVERSION N/A 02/12/2017   Procedure: TRANSESOPHAGEAL ECHOCARDIOGRAM (TEE);  Surgeon: Kathleene Hazel, MD;  Location: [facility];  Service: Open Heart Surgery;  Laterality: N/A;  . TRANSCATHETER AORTIC VALVE REPLACEMENT, TRANSFEMORAL N/A 02/12/2017   Procedure: TRANSCATHETER AORTIC VALVE REPLACEMENT, TRANSFEMORAL using a 23mm Edwards Sapien 3 Aortic Valve;  Surgeon: Kathleene Hazel, MD;  Location: [facility] OR;  Service: Open Heart Surgery;  Laterality: N/A;  . TUBAL LIGATION     Family History:  Family History  Problem Relation Age of Onset  . Heart block Mother   . Heart failure Father   . Heart disease Sister   . Breast cancer Maternal Aunt    Family Psychiatric  History: Mother has history of schizophrenia Social History:  Social History   Substance and Sexual Activity  Alcohol Use No     Social History   Substance and Sexual Activity  Drug Use No    Social History   Socioeconomic History  . Marital status: Married    Spouse name: Not on file  . Number of children: Not on file  . Years of education: Not on file  . Highest education level: Not on file  Occupational History  . Not on file  Social Needs  . Financial resource strain: Not on file  . Food insecurity:    Worry: Not on file    Inability: Not on file  . Transportation needs:    Medical: Not on file    Non-medical: Not on  file  Tobacco Use  . Smoking status: Never Smoker  . Smokeless tobacco: Never Used  Substance and Sexual Activity  . Alcohol use: No  . Drug use: No  . Sexual activity: Not Currently  Lifestyle  . Physical activity:    Days per week: Not on file    Minutes per session: Not on file  . Stress: Not on file  Relationships  . Social connections:    Talks on phone: Not on file    Gets together: Not on file    Attends religious service: Not on file    Active member of club or organization: Not on file    Attends meetings of clubs or organizations: Not on file    Relationship status: Not on file  Other Topics Concern  . Not on file  Social History Narrative  . Not on file    Sleep: Poor  Appetite:  Fair  Current Medications: Current Facility-Administered Medications  Medication Dose Route Frequency Provider Last Rate Last Dose  . furosemide (LASIX) tablet 20 mg  20 mg Oral Daily Jeanmarie Plant, MD   20 mg at 04/20/18 1131  . levothyroxine (SYNTHROID) tablet 50 mcg  50 mcg  Oral QAC breakfast Jeanmarie Plant, MD   50 mcg at 04/20/18 1132  . polyvinyl alcohol (LIQUIFILM TEARS) 1.4 % ophthalmic solution 1 drop  1 drop Both Eyes PRN Jeanmarie Plant, MD      . risperiDONE (RISPERDAL) tablet 0.25 mg  0.25 mg Oral TID PRN Luciano Cutter, DO      . risperiDONE (RISPERDAL) tablet 0.5 mg  0.5 mg Oral BID Jeanmarie Plant, MD   0.5 mg at 04/20/18 1133  . sertraline (ZOLOFT) tablet 50 mg  50 mg Oral Daily Donata Clay R, DO   50 mg at 04/20/18 1132  . sodium chloride flush (NS) 0.9 % injection 3 mL  3 mL Intravenous Once Jeanmarie Plant, MD       Current Outpatient Medications  Medication Sig Dispense Refill  . Calcium Carbonate-Vitamin D (CALCIUM-VITAMIN D) 600-125 MG-UNIT TABS Take by mouth daily.    . iron polysaccharides (NIFEREX) 150 MG capsule Take 150 mg by mouth daily.    Marland Kitchen levothyroxine (SYNTHROID, LEVOTHROID) 50 MCG tablet Take 50 mcg by mouth daily before breakfast.     .  loratadine (CLARITIN) 10 MG tablet Take 10 mg by mouth daily.     . memantine (NAMENDA) 10 MG tablet Take 10 mg by mouth 2 (two) times daily.   0  . omeprazole (PRILOSEC) 20 MG capsule Take 1 capsule (20 mg total) by mouth daily. 90 capsule 3  . PARoxetine (PAXIL) 10 MG tablet Take 15 mg by mouth every evening.     . risperiDONE (RISPERDAL) 0.25 MG tablet Take 0.25 mg by mouth 2 (two) times daily.    . vitamin B-12 (CYANOCOBALAMIN) 1000 MCG tablet Take 1,000 mcg by mouth daily.    Marland Kitchen acetaminophen (TYLENOL) 325 MG tablet Take 650 mg by mouth every 6 (six) hours as needed for moderate pain or headache.     . clindamycin (CLEOCIN) 150 MG capsule Take 6,000 mg by mouth as directed. 1 hour before dental procedures  0  . clobetasol cream (TEMOVATE) 0.05 % Apply 1 application topically See admin instructions. Apply small amount vaginally on Monday and Friday    . estradiol (ESTRACE) 0.1 MG/GM vaginal cream Place 1 Applicatorful vaginally See admin instructions. Apply 0.5 grams vaginally Tuesday and Thursday morning    . furosemide (LASIX) 20 MG tablet Take 1 tablet (20 mg total) by mouth daily. 5 tablet 0  . polyvinyl alcohol (LIQUIFILM TEARS) 1.4 % ophthalmic solution Place 1 drop into both eyes as needed for dry eyes.      Lab Results:  Results for orders placed or performed during the hospital encounter of 04/18/18 (from the past 48 hour(s))  Glucose, capillary     Status: None   Collection Time: 04/18/18  4:44 PM  Result Value Ref Range   Glucose-Capillary 91 70 - 99 mg/dL  Comprehensive metabolic panel     Status: Abnormal   Collection Time: 04/18/18  5:43 PM  Result Value Ref Range   Sodium 143 135 - 145 mmol/L   Potassium 3.4 (L) 3.5 - 5.1 mmol/L   Chloride 111 98 - 111 mmol/L   CO2 22 22 - 32 mmol/L   Glucose, Bld 94 70 - 99 mg/dL   BUN 36 (H) 8 - 23 mg/dL   Creatinine, Ser 4.70 0.44 - 1.00 mg/dL   Calcium 9.3 8.9 - 96.2 mg/dL   Total Protein 7.5 6.5 - 8.1 g/dL   Albumin 4.4 3.5 -  5.0 g/dL  AST 26 15 - 41 U/L   ALT 15 0 - 44 U/L   Alkaline Phosphatase 72 38 - 126 U/L   Total Bilirubin 0.7 0.3 - 1.2 mg/dL   GFR calc non Af Amer >60 >60 mL/min   GFR calc Af Amer >60 >60 mL/min   Anion gap 10 5 - 15    Comment: Performed at [facility], [address] [place], [place] [address]  CBC     Status: Abnormal   Collection Time: 04/18/18  5:43 PM  Result Value Ref Range   WBC 5.0 4.0 - 10.5 K/uL   RBC 3.60 (L) 3.87 - 5.11 MIL/uL   Hemoglobin 10.7 (L) 12.0 - 15.0 g/dL   HCT 52.8 (L) 41.3 - 24.4 %   MCV 96.7 80.0 - 100.0 fL   MCH 29.7 26.0 - 34.0 pg   MCHC 30.7 30.0 - 36.0 g/dL   RDW 01.0 27.2 - 53.6 %   Platelets 153 150 - 400 K/uL   nRBC 0.0 0.0 - 0.2 %    Comment: Performed at [facility], [address] [place], [place] [address]  Urinalysis, Complete w Microscopic     Status: Abnormal   Collection Time: 04/18/18  5:43 PM  Result Value Ref Range   Color, Urine YELLOW (A) YELLOW   APPearance HAZY (A) CLEAR   Specific Gravity, Urine 1.025 1.005 - 1.030   pH 5.0 5.0 - 8.0   Glucose, UA NEGATIVE NEGATIVE mg/dL   Hgb urine dipstick SMALL (A) NEGATIVE   Bilirubin Urine NEGATIVE NEGATIVE   Ketones, ur 5 (A) NEGATIVE mg/dL   Protein, ur NEGATIVE NEGATIVE mg/dL   Nitrite NEGATIVE NEGATIVE   Leukocytes,Ua NEGATIVE NEGATIVE   RBC / HPF 0-5 0 - 5 RBC/hpf   WBC, UA 0-5 0 - 5 WBC/hpf   Bacteria, UA NONE SEEN NONE SEEN   Squamous Epithelial / LPF 0-5 0 - 5    Comment: Performed at [facility], [address], [place], [place] [address]  Urine culture     Status: None (Preliminary result)   Collection Time: 04/18/18  5:43 PM  Result Value Ref Range   Specimen Description      URINE, RANDOM Performed at [facility], [address], [place], [place] [address]    Special Requests      NONE Performed at [facility], [address], [place], [place] [address]    Culture      CULTURE REINCUBATED FOR BETTER  GROWTH Performed at [facility], [address] [place], [place] [address]    Report Status PENDING   Ammonia     Status: None   Collection Time: 04/18/18  5:43 PM  Result Value Ref Range   Ammonia 14 9 - 35 umol/L    Comment: Performed at [facility], [address] [place], [place] [address]  Ethanol     Status: None   Collection Time: 04/18/18  5:43 PM  Result Value Ref Range   Alcohol, Ethyl (B) <10 <10 mg/dL    Comment: (NOTE) Lowest detectable limit for serum alcohol is 10 mg/dL. For medical purposes only. Performed at [facility], [address] [place], [place] [address]   Acetaminophen level     Status: Abnormal   Collection Time: 04/18/18  5:43 PM  Result Value Ref Range   Acetaminophen (Tylenol), Serum <10 (L) 10 - 30 ug/mL    Comment: (NOTE) Therapeutic concentrations vary significantly. A  range of 10-30 ug/mL  may be an effective concentration for many patients. However, some  are best treated at concentrations outside of this range. Acetaminophen concentrations >150 ug/mL at 4 hours after ingestion  and >50 ug/mL at 12 hours after ingestion are often associated with  toxic reactions. Performed at [facility], [address] [place], [place] [address]   Salicylate level     Status: None   Collection Time: 04/18/18  5:43 PM  Result Value Ref Range   Salicylate Lvl <7.0 2.8 - 30.0 mg/dL    Comment: Performed at [facility], [address] [place], [place] [address]  TSH     Status: None   Collection Time: 04/18/18  6:14 PM  Result Value Ref Range   TSH 1.588 0.350 - 4.500 uIU/mL    Comment: Performed by a 3rd Generation assay with a functional sensitivity of <=0.01 uIU/mL. Performed at [facility], [address] [place], [place] [address]     Blood Alcohol level:  Lab Results  Component Value Date   [facility] 04[facility]   ETH <10 03/19/2017    Physical Findings: AIMS:  , ,  ,  ,     CIWA:    COWS:     Musculoskeletal: Strength & Muscle Tone: decreased Gait & Station: unable to assess Patient leans: N/A  Psychiatric Specialty Exam: Physical Exam  Constitutional: She appears well-developed.  HENT:  Head: Normocephalic and atraumatic.  Respiratory: Effort normal.    Review of Systems  Constitutional: Positive for malaise/fatigue. Negative for chills.  Gastrointestinal: Negative for constipation.  Genitourinary: Negative for dysuria.  Neurological: Positive for headaches.    Blood pressure (!) 172/67, pulse 77, temperature 98.3 F (36.8 C), temperature source Oral, resp. rate 18, height 5\' 3"  (1.6 m), weight 61.2 kg, SpO2 96 %.Body mass index is 23.91 kg/m.  General Appearance: Fairly Groomed  Eye Contact:  Fair  Speech:  rambling, clear  Volume:  Decreased  Mood:  "Just tired"  Affect:  Pleasant, confused  Thought Process:  Descriptions of Associations: Tangential  Orientation:  Other:  Person place  Thought Content:  Noted some paranoia, and recent visual and auditory hallucinations  Suicidal Thoughts:  No  Homicidal Thoughts:  No  Memory:  Immediate;   Poor Recent;   Poor Remote;   Fair  Judgement:  Impaired  Insight:  Lacking  Psychomotor Activity:  Normal  Concentration:  Concentration: Poor  Recall:  Fis[employer]Fair  Fund of Knowledge:  Fair  Language:  Fair  Akathisia:  No  Handed:  Right  AIMS (if indicated):     Assets:  Desire for Improvement Housing Physical Health Social Support  ADL's:  Impaired  Cognition:  Impaired,  Moderate  Sleep:      Assessment:  Considering the patient's current presentation, this appears to be her behavioral disturbances related major Neurocognitive disorder, mixed etiology (vascular? LBD versus Alzheimer's) there is not an obvious source for delirium other than recent medication adjustments. She is now back on zoloft.  Would recommend that she requires further evaluation and treatment for the her behavioral  disturbances, have not improved during her time in the emergency department, she would fare better in a geriatric psychiatry inpatient unit.     Treatment Plan Summary: Daily contact with patient to assess and evaluate symptoms and progress in treatment, Medication management and Plan As follows   Delirium/ MNCD: recommending IVC to inpatient Geri psych  Zoloft 50mg  po Q daily  Risperdal  0.5mg  PO BID   Would recommend melatonin 6 mg p.o. at bedtime QHS if available in the hospital  Risperdal 0.25 mg p.o. 3 times daily as needed for agitation  Please avoid benzodiazepines, anticholinergic, high dose antipsychotic medications in this patient   Luciano Cutter, DO 04/20/2018, 12:54 PM

## 2018-04-20 NOTE — ED Notes (Signed)
In to check on pt; restless; voices need to void; used purewick and nodded back off to sleep

## 2018-04-20 NOTE — ED Notes (Signed)
Pt awake, had kicked off covers; calm and pleasant at this time; recovered with blanket and heat adjusted in room; no complaints or requests;

## 2018-04-20 NOTE — ED Notes (Signed)
Patient asked to sit straight up and cough.   Oxygen sensor replaced.   Patient taken off of oxygen via [place]. Patient's oxygen 99% on RA.

## 2018-04-20 NOTE — ED Notes (Signed)
Patient's oxygen saturation increased to 3L [place]. MD informed of patient's oxygen saturation level. See orders. Patient's oxygen saturation currently 92% on 3L.

## 2018-04-20 NOTE — ED Notes (Signed)
Patient's oxygen saturation level 99%. Oxygen decreaed back to 3L [place]. RN will continue to monitor.

## 2018-04-20 NOTE — ED Notes (Signed)
Patient has been accepted to [facility].  Patient assigned to room 403A Accepting physician is Dr. Yetta Barre.  Call report to [phone].  Representative was [employer].   ER Staff is aware of it:  [facility] ER Secretary  Dr. Don Perking, ER MD  Eileen Stanford Patient's Nurse     Patient's Family/Support System Annia Belt (daughter/legal guardian [phone]) has been updated as well.

## 2018-04-20 NOTE — BH Assessment (Addendum)
Referral information for Psychiatric Hospitalization faxed to;   [facility] [phone] -or- [phone]), Wait List  . [facility] [phone]---[phone]),  . [facility] Lan [phone], [phone], [phone] or [phone]),   . [facility] [phone]),   . [facility] [phone]),   . [place] [phone]),   . [address] [place] [phone]),   . [facility] [phone] or [phone])  . [place] ([phone] or [phone]), Pending Review.  [facility] [phone]).

## 2018-04-20 NOTE — ED Provider Notes (Signed)
I was told by the nurse the patient was hypoxic with oxygenation in 80%.  I went in the room and asked patient to cough a few times and take several deep breaths.  That normalized her sats to 98% on room air.  Most likely due to atelectasis since patient has been in bed for over 24 hours.  Chest x-ray was done which does not show any evidence of pneumonia or pulmonary edema.  I repeated an EKG which is unchanged from prior with a known left bundle branch block, normal sinus rhythm and no ischemic changes.  Patient will be placed tomorrow.   Don Perking, [place], MD 04/20/18 2017

## 2018-04-20 NOTE — ED Provider Notes (Signed)
-----------------------------------------   6:17 AM on 04/20/2018 -----------------------------------------   Blood pressure (!) 165/75, pulse 74, temperature 98.6 F (37 C), temperature source Oral, resp. rate 16, height 5\' 3"  (1.6 m), weight 61.2 kg, SpO2 100 %.  The patient is calm and cooperative at this time.  There have been no acute events since the last update.  Awaiting disposition plan from Behavioral Medicine team.   Irean Hong, MD 04/20/18 [phone]

## 2018-04-20 NOTE — ED Notes (Signed)
Called to room by officer on duty in quad; pt sitting on end of bed, straddling the railings; had removed her gown and only had on an attends; pt had a purewick in place that she had been using since I arrived last night, but pt was a little confused this am and purewick had been removed; incontinent of small amount of urine; assisted back to bed by ed techs alyssa and sarah; pt cleaned well; purewick and clean attends in place; fresh gown; warm blankets; bed alarm placed for safety; pt calm and present;

## 2018-04-20 NOTE — ED Notes (Signed)
Pt requested tylenol for a headache; could not give a number but states it hurt "a little bit." asked for and received order for tylenol 650 from Dr. Fanny Bien.

## 2018-04-20 NOTE — ED Notes (Signed)
Updated daughter Olegario Messier. Pt did not want to speak with her, but she said she might try tomorrow. Pt seemed lucid during the conversation, telling this nurse about her complicated relationship with her daughter.

## 2018-04-20 NOTE — ED Notes (Signed)
Patient pulse oxygen dropped to 88% on RA while resting. Oxygen sensors changed and replaced. Patient's oxygen level ranges from 94-96% while awake, but continues to drop to 88% on RA while sleeping/resting. Patient placed on 2L [place]. Patient's oxygen saturation increased to 94% on 2L [place] while resting.  RN will continue to monitor. MD informed

## 2018-04-20 NOTE — ED Provider Notes (Signed)
EKG reviewed and interpreted by me at 1435 Heart rate 90 QRS 130 QTc 520 Normal sinus rhythm, left bundle branch block.   Sharyn Creamer, MD 04/20/18 1436

## 2018-04-20 NOTE — ED Notes (Signed)
Pt resting with eyes closed and resp even/unlabored

## 2018-04-21 DIAGNOSIS — F29 Unspecified psychosis not due to a substance or known physiological condition: Secondary | ICD-10-CM | POA: Diagnosis not present

## 2018-04-21 LAB — URINE CULTURE: Culture: >=100000 — AB

## 2018-04-21 MED ORDER — ACETAMINOPHEN 325 MG PO TABS
650.0000 mg | ORAL_TABLET | Freq: Once | ORAL | Status: AC
  Administered 2018-04-21: 650 mg via ORAL
  Filled 2018-04-21: qty 2

## 2018-04-21 NOTE — ED Notes (Signed)
Warm wash cloth was given to patient to wash face and hands before breakfast.

## 2018-04-21 NOTE — ED Notes (Signed)
EMTALA reviewed by charge RN 

## 2018-04-21 NOTE — ED Provider Notes (Signed)
-----------------------------------------   5:53 AM on 04/21/2018 -----------------------------------------   Blood pressure (!) 170/62, pulse 73, temperature 98.4 F (36.9 C), temperature source Oral, resp. rate 18, height 5\' 3"  (1.6 m), weight 61.2 kg, SpO2 97 %.  The patient is calm and cooperative at this time.  There have been no acute events since the last update.  Probable transfer to [facility] today.   Irean Hong, MD 04/21/18 [phone]

## 2018-04-21 NOTE — ED Notes (Signed)
RN to room for rounding. Patient's hearing aids making loud ringing noise. Patient had removed both hearing aids. Left hearing aid found on floor. Right hearing aid found in bed. Hearing aids placed on bedside table.

## 2018-04-21 NOTE — ED Notes (Signed)
Pt calling out stating she has a headache. Asking for tylenol. Dr Dolores Frame informed new orders received.

## 2018-04-21 NOTE — ED Notes (Signed)
[place]  [employer]  called

## 2018-04-21 NOTE — ED Notes (Signed)
Patient called out. RN to bedside. Patient pulling batteries out of hearing aids. Patient asking for long batteries to place into hearing aids. Patient informed that there were no batteries at the hospital, and that her family would have to be called at a more appropriate hour to bring them later. Patient informed RN that her sister would be the best person to call. Hearing aids placed into clear bag, and back onto bedside table.

## 2018-04-21 NOTE — ED Notes (Signed)
RN to bedside for rounding. Patient had removed external foley catheter. Patient's brief wet. Patient cleaned and changed into clean/dry brief. Bed protector and sheet changed.

## 2018-04-22 NOTE — Hospital Discharge Follow-Up (Signed)
This pharmacist called to give report at [facility] [phone]) for patient transferred to room 403 - 04/21/18.    Called Ed Culture Report results to Joycelyn Man, RN  ED Antimicrobial Stewardship Positive Culture Follow Up   MACALA PROKOSCH is an 85 y.o. female who presented to [facility] on 04/18/2018 with a chief complaint of  Chief Complaint  Patient presents with  . Altered Mental Status    Recent Results (from the past 720 hour(s))  Urine culture     Status: Abnormal   Collection Time: 04/18/18  5:43 PM  Result Value Ref Range Status   Specimen Description   Final    URINE, RANDOM Performed at [facility], [address] [place], [place] [address]    Special Requests   Final    NONE Performed at [facility], [address] [place], [place] [address]    Culture >=100,000 COLONIES/mL ENTEROCOCCUS FAECALIS (A)  Final   Report Status 04/21/2018 FINAL  Final   Organism ID, Bacteria ENTEROCOCCUS FAECALIS (A)  Final      Susceptibility   Enterococcus faecalis - MIC*    AMPICILLIN <=2 SENSITIVE Sensitive     LEVOFLOXACIN 4 INTERMEDIATE Intermediate     NITROFURANTOIN <=16 SENSITIVE Sensitive     VANCOMYCIN 1 SENSITIVE Sensitive     * >=100,000 COLONIES/mL ENTEROCOCCUS FAECALIS     Albina Billet, PharmD, BCPS Clinical Pharmacist 04/22/2018 2:51 PM

## 2018-05-07 ENCOUNTER — Telehealth: Payer: Self-pay | Admitting: Cardiovascular Disease

## 2018-05-07 MED ORDER — SUREPRESS HI COMPRESS BANDAGE MISC
2.00 | Status: DC
Start: ? — End: 2018-05-07

## 2018-05-07 MED ORDER — MEMANTINE HCL 10 MG PO TABS
10.00 | ORAL_TABLET | ORAL | Status: DC
Start: 2018-05-07 — End: 2018-05-07

## 2018-05-07 MED ORDER — LEVOTHYROXINE SODIUM 50 MCG PO TABS
50.00 | ORAL_TABLET | ORAL | Status: DC
Start: 2018-05-08 — End: 2018-05-07

## 2018-05-07 MED ORDER — PANTOPRAZOLE SODIUM 20 MG PO TBEC
20.00 | DELAYED_RELEASE_TABLET | ORAL | Status: DC
Start: 2018-05-08 — End: 2018-05-07

## 2018-05-07 MED ORDER — VICON FORTE PO CAPS
17.00 | ORAL_CAPSULE | ORAL | Status: DC
Start: 2018-05-08 — End: 2018-05-07

## 2018-05-07 MED ORDER — OLANZAPINE 10 MG PO TABS
10.00 | ORAL_TABLET | ORAL | Status: DC
Start: 2018-05-07 — End: 2018-05-07

## 2018-05-07 MED ORDER — APIXABAN 5 MG PO TABS
5.00 | ORAL_TABLET | ORAL | Status: DC
Start: 2018-05-08 — End: 2018-05-07

## 2018-05-07 MED ORDER — RESCON-ED 8-120 MG PO CPCR
5.00 | ORAL_CAPSULE | ORAL | Status: DC
Start: ? — End: 2018-05-07

## 2018-05-07 MED ORDER — DOCUSATE SODIUM 100 MG PO CAPS
100.00 | ORAL_CAPSULE | ORAL | Status: DC
Start: 2018-05-07 — End: 2018-05-07

## 2018-05-07 MED ORDER — CALCIUM CARBONATE-VITAMIN D 600-400 MG-UNIT PO TABS
1.00 | ORAL_TABLET | ORAL | Status: DC
Start: 2018-05-08 — End: 2018-05-07

## 2018-05-07 MED ORDER — APIXABAN 5 MG PO TABS
10.00 | ORAL_TABLET | ORAL | Status: DC
Start: 2018-05-07 — End: 2018-05-07

## 2018-05-07 MED ORDER — VITAMIN D3 25 MCG (1000 UT) PO TABS
1000.00 | ORAL_TABLET | ORAL | Status: DC
Start: 2018-05-08 — End: 2018-05-07

## 2018-05-07 MED ORDER — LORATADINE 10 MG PO TABS
10.00 | ORAL_TABLET | ORAL | Status: DC
Start: 2018-05-08 — End: 2018-05-07

## 2018-05-07 MED ORDER — SERTRALINE HCL 25 MG PO TABS
25.00 | ORAL_TABLET | ORAL | Status: DC
Start: 2018-05-08 — End: 2018-05-07

## 2018-05-07 MED ORDER — POLYSACCHARIDE IRON COMPLEX 150 MG PO CAPS
150.00 | ORAL_CAPSULE | ORAL | Status: DC
Start: 2018-05-07 — End: 2018-05-07

## 2018-05-07 MED ORDER — KETOTIFEN FUMARATE 0.025 % OP SOLN
1.00 | OPHTHALMIC | Status: DC
Start: 2018-05-07 — End: 2018-05-07

## 2018-05-07 MED ORDER — QUINERVA 260 MG PO TABS
650.00 | ORAL_TABLET | ORAL | Status: DC
Start: ? — End: 2018-05-07

## 2018-05-07 NOTE — Telephone Encounter (Signed)
Attempted to call patient. LMTCB 05/07/2018.  

## 2018-05-07 NOTE — Telephone Encounter (Signed)
On review of the patient's chart, we are not currently consulted on a hospital admission. Is the admission at [facility]? Is it current? I don't see any record of anticoagulation or PE. Please keep me updated with any additional information.

## 2018-05-07 NOTE — Telephone Encounter (Signed)
Returned call to daughter Olegario Messier.   She reports that she is on the way to pick their mother up after hospitalization for UTI.   She developed bilateral PE and DVT in leg (unsure which one) while admitted.   She was on blood thinner while admitted. Being d/c on Eliquis (unsure of dose, will call back).   Per daughter, she has bovine valve and was not previously prescribed anticoag d/t hx of anemia and GI bleeding.   Daughter wants to know when blood thinner can be d/c.   Routed to provider to advise. Will update as more information comes in.

## 2018-05-07 NOTE — Telephone Encounter (Signed)
Pt daughter is calling, states patient has been in [facility], behavior health hospitlal. Pt is currently on a blood  thinner. States patient ususally wears compression stockings, but at the [facility] did not wear them for at least a week. Patient developed a blood clot. Pt is currently on Xarelto. In 9 days, patient has been on 4 blood thinners. Please call to discuss.

## 2018-05-07 NOTE — Telephone Encounter (Signed)
Admit at [facility] in [place]. From Epic i can see original ED encounter but it sounds like they transferred her to psych hospital (in original triage call note). They admitted her originally for psych but found that she had UTI. Per daughter she then developed blood clots. Original admit 4/17. Pt is being d/c today to family/[facility].

## 2018-05-07 NOTE — Telephone Encounter (Signed)
Returned call to daughter, Olegario Messier.   We reviewed advice from provider. She verbalized understanding.   She confirmed, anticoagulant prescribed is Eliquis 5 mg BID. DVT was in R leg (posterior knee).   I made appt for f/u based on last OV AVS, June 2nd.

## 2018-05-28 ENCOUNTER — Telehealth: Payer: Self-pay

## 2018-05-28 NOTE — Telephone Encounter (Signed)
Call attempted to switch face to face visit on 06/03/2018 with Dr. Kirke Corin to a telehealth visit due to current clinic policies related to COVID 9 precautions. Left v/m message requesting for patient to call back to discuss.

## 2018-05-30 ENCOUNTER — Telehealth: Payer: Self-pay | Admitting: Cardiovascular Disease

## 2018-05-30 NOTE — Telephone Encounter (Signed)
Virtual Visit Pre-Appointment Phone Call  "(Name), I am calling you today to discuss your upcoming appointment. We are currently trying to limit exposure to the virus that causes COVID-19 by seeing patients at home rather than in the office."  1. "What is the BEST phone number to call the day of the visit?" - include this in appointment notes  2. Do you have or have access to (through a family member/friend) a smartphone with video capability that we can use for your visit?" a. If yes - list this number in appt notes as cell (if different from BEST phone #) and list the appointment type as a VIDEO visit in appointment notes b. If no - list the appointment type as a PHONE visit in appointment notes  3. Confirm consent - "In the setting of the current Covid19 crisis, you are scheduled for a (phone or video) visit with your provider on (date) at (time).  Just as we do with many in-office visits, in order for you to participate in this visit, we must obtain consent.  If you'd like, I can send this to your mychart (if signed up) or email for you to review.  Otherwise, I can obtain your verbal consent now.  All virtual visits are billed to your insurance company just like a normal visit would be.  By agreeing to a virtual visit, we'd like you to understand that the technology does not allow for your provider to perform an examination, and thus may limit your provider's ability to fully assess your condition. If your provider identifies any concerns that need to be evaluated in person, we will make arrangements to do so.  Finally, though the technology is pretty good, we cannot assure that it will always work on either your or our end, and in the setting of a video visit, we may have to convert it to a phone-only visit.  In either situation, we cannot ensure that we have a secure connection.  Are you willing to proceed?" STAFF: Did the patient verbally acknowledge consent to telehealth visit? Document  YES/NO here: YES  4. Advise patient to be prepared - "Two hours prior to your appointment, go ahead and check your blood pressure, pulse, oxygen saturation, and your weight (if you have the equipment to check those) and write them all down. When your visit starts, your provider will ask you for this information. If you have an Apple Watch or Kardia device, please plan to have heart rate information ready on the day of your appointment. Please have a pen and paper handy nearby the day of the visit as well."  5. Give patient instructions for MyChart download to smartphone OR Doximity/Doxy.me as below if video visit (depending on what platform provider is using)  6. Inform patient they will receive a phone call 15 minutes prior to their appointment time (may be from unknown caller ID) so they should be prepared to answer    TELEPHONE CALL NOTE  SELEAH RUBEN has been deemed a candidate for a follow-up tele-health visit to limit community exposure during the Covid-19 pandemic. I spoke with the patient via phone to ensure availability of phone/video source, confirm preferred email & phone number, and discuss instructions and expectations.  I reminded Kelsey Le to be prepared with any vital sign and/or heart rhythm information that could potentially be obtained via home monitoring, at the time of her visit. I reminded EMONNIE BETZOLD to expect a phone call prior to  her visit.  Norman Herrlichshley Gerringer 05/30/2018 3:25 PM   INSTRUCTIONS FOR DOWNLOADING THE MYCHART APP TO SMARTPHONE  - The patient must first make sure to have activated MyChart and know their login information - If Apple, go to [employer] Store and type in MyChart in the search bar and download the app. If Android, ask patient to go to [employer] Play Store and type in PinecraftMyChart in the search bar and download the app. The app is free but as with any other app downloads, their phone may require them to verify saved payment information or Apple/Android  password.  - The patient will need to then log into the app with their MyChart username and password, and select [facility] as their healthcare provider to link the account. When it is time for your visit, go to the MyChart app, find appointments, and click Begin Video Visit. Be sure to Select Allow for your device to access the Microphone and Camera for your visit. You will then be connected, and your provider will be with you shortly.  **If they have any issues connecting, or need assistance please contact MyChart service desk [phone])**  **If using a computer, in order to ensure the best quality for their visit they will need to use either of the following Internet Browsers: [employer] Edge, or Google Chrome**  IF USING DOXIMITY or DOXY.ME - The patient will receive a link just prior to their visit by text.     FULL LENGTH CONSENT FOR TELE-HEALTH VISIT   I hereby voluntarily request, consent and authorize [facility] and its employed or contracted physicians, physician assistants, nurse practitioners or other licensed health care professionals (the Practitioner), to provide me with telemedicine health care services (the Services") as deemed necessary by the treating Practitioner. I acknowledge and consent to receive the Services by the Practitioner via telemedicine. I understand that the telemedicine visit will involve communicating with the Practitioner through live audiovisual communication technology and the disclosure of certain medical information by electronic transmission. I acknowledge that I have been given the opportunity to request an in-person assessment or other available alternative prior to the telemedicine visit and am voluntarily participating in the telemedicine visit.  I understand that I have the right to withhold or withdraw my consent to the use of telemedicine in the course of my care at any time, without affecting my right to future care or treatment,  and that the Practitioner or I may terminate the telemedicine visit at any time. I understand that I have the right to inspect all information obtained and/or recorded in the course of the telemedicine visit and may receive copies of available information for a reasonable fee.  I understand that some of the potential risks of receiving the Services via telemedicine include:   Delay or interruption in medical evaluation due to technological equipment failure or disruption;  Information transmitted may not be sufficient (e.g. poor resolution of images) to allow for appropriate medical decision making by the Practitioner; and/or   In rare instances, security protocols could fail, causing a breach of personal health information.  Furthermore, I acknowledge that it is my responsibility to provide information about my medical history, conditions and care that is complete and accurate to the best of my ability. I acknowledge that Practitioner's advice, recommendations, and/or decision may be based on factors not within their control, such as incomplete or inaccurate data provided by me or distortions of diagnostic images or specimens that may result from electronic transmissions. I understand  that the practice of medicine is not an exact science and that Practitioner makes no warranties or guarantees regarding treatment outcomes. I acknowledge that I will receive a copy of this consent concurrently upon execution via email to the email address I last provided but may also request a printed copy by calling the office of [facility].    I understand that my insurance will be billed for this visit.   I have read or had this consent read to me.  I understand the contents of this consent, which adequately explains the benefits and risks of the Services being provided via telemedicine.   I have been provided ample opportunity to ask questions regarding this consent and the Services and have had my questions  answered to my satisfaction.  I give my informed consent for the services to be provided through the use of telemedicine in my medical care  By participating in this telemedicine visit I agree to the above.

## 2018-06-03 ENCOUNTER — Other Ambulatory Visit: Payer: Self-pay

## 2018-06-03 ENCOUNTER — Encounter: Payer: Medicare Other | Admitting: Cardiovascular Disease

## 2018-06-03 NOTE — Progress Notes (Signed)
Rescheduled This encounter was created in error - please disregard. 

## 2018-06-19 ENCOUNTER — Emergency Department
Admission: EM | Admit: 2018-06-19 | Discharge: 2018-06-19 | Disposition: A | Discharge status: Skilled Nursing Facility | Payer: Medicare Other | Attending: Emergency Medicine | Admitting: Emergency Medicine

## 2018-06-19 ENCOUNTER — Emergency Department: Payer: Medicare Other

## 2018-06-19 ENCOUNTER — Other Ambulatory Visit: Payer: Self-pay

## 2018-06-19 ENCOUNTER — Encounter: Payer: Self-pay | Admitting: Emergency Medicine

## 2018-06-19 DIAGNOSIS — M7060 Trochanteric bursitis, unspecified hip: Secondary | ICD-10-CM | POA: Diagnosis present

## 2018-06-19 DIAGNOSIS — F0391 Unspecified dementia with behavioral disturbance: Secondary | ICD-10-CM | POA: Diagnosis not present

## 2018-06-19 DIAGNOSIS — D500 Iron deficiency anemia secondary to blood loss (chronic): Secondary | ICD-10-CM

## 2018-06-19 DIAGNOSIS — Z79899 Other long term (current) drug therapy: Secondary | ICD-10-CM | POA: Diagnosis not present

## 2018-06-19 DIAGNOSIS — N3000 Acute cystitis without hematuria: Secondary | ICD-10-CM | POA: Diagnosis not present

## 2018-06-19 DIAGNOSIS — R9431 Abnormal electrocardiogram [ECG] [EKG]: Secondary | ICD-10-CM | POA: Diagnosis present

## 2018-06-19 DIAGNOSIS — I5032 Chronic diastolic (congestive) heart failure: Secondary | ICD-10-CM | POA: Diagnosis not present

## 2018-06-19 DIAGNOSIS — R410 Disorientation, unspecified: Secondary | ICD-10-CM

## 2018-06-19 DIAGNOSIS — R4182 Altered mental status, unspecified: Secondary | ICD-10-CM

## 2018-06-19 DIAGNOSIS — M5416 Radiculopathy, lumbar region: Secondary | ICD-10-CM | POA: Diagnosis present

## 2018-06-19 DIAGNOSIS — I350 Nonrheumatic aortic (valve) stenosis: Secondary | ICD-10-CM

## 2018-06-19 DIAGNOSIS — L900 Lichen sclerosus et atrophicus: Secondary | ICD-10-CM | POA: Diagnosis present

## 2018-06-19 DIAGNOSIS — Z20828 Contact with and (suspected) exposure to other viral communicable diseases: Secondary | ICD-10-CM | POA: Diagnosis not present

## 2018-06-19 DIAGNOSIS — M169 Osteoarthritis of hip, unspecified: Secondary | ICD-10-CM | POA: Diagnosis present

## 2018-06-19 DIAGNOSIS — D509 Iron deficiency anemia, unspecified: Secondary | ICD-10-CM | POA: Diagnosis present

## 2018-06-19 DIAGNOSIS — Z046 Encounter for general psychiatric examination, requested by authority: Secondary | ICD-10-CM | POA: Diagnosis present

## 2018-06-19 DIAGNOSIS — F05 Delirium due to known physiological condition: Secondary | ICD-10-CM

## 2018-06-19 LAB — COMPREHENSIVE METABOLIC PANEL
ALT: 16 U/L (ref 0–44)
AST: 25 U/L (ref 15–41)
Albumin: 4.3 g/dL (ref 3.5–5.0)
Alkaline Phosphatase: 80 U/L (ref 38–126)
Anion gap: 12 (ref 5–15)
BUN: 38 mg/dL — ABNORMAL HIGH (ref 8–23)
CO2: 24 mmol/L (ref 22–32)
Calcium: 9.2 mg/dL (ref 8.9–10.3)
Chloride: 110 mmol/L (ref 98–111)
Creatinine, Ser: 0.91 mg/dL (ref 0.44–1.00)
GFR calc Af Amer: >60 mL/min (ref >60)
GFR calc non Af Amer: 58 mL/min — ABNORMAL LOW (ref >60)
Glucose, Bld: 109 mg/dL — ABNORMAL HIGH (ref 70–99)
Potassium: 3.4 mmol/L — ABNORMAL LOW (ref 3.5–5.1)
Sodium: 146 mmol/L — ABNORMAL HIGH (ref 135–145)
Total Bilirubin: 0.6 mg/dL (ref 0.3–1.2)
Total Protein: 7.1 g/dL (ref 6.5–8.1)

## 2018-06-19 LAB — CBC
HCT: 32.8 % — ABNORMAL LOW (ref 36.0–46.0)
Hemoglobin: 10.2 g/dL — ABNORMAL LOW (ref 12.0–15.0)
MCH: 29.1 pg (ref 26.0–34.0)
MCHC: 31.1 g/dL (ref 30.0–36.0)
MCV: 93.4 fL (ref 80.0–100.0)
Platelets: 172 10*3/uL (ref 150–400)
RBC: 3.51 MIL/uL — ABNORMAL LOW (ref 3.87–5.11)
RDW: 14.6 % (ref 11.5–15.5)
WBC: 7.1 10*3/uL (ref 4.0–10.5)
nRBC: 0 % (ref 0.0–0.2)

## 2018-06-19 LAB — URINALYSIS, COMPLETE (UACMP) WITH MICROSCOPIC
Bilirubin Urine: NEGATIVE
Glucose, UA: NEGATIVE mg/dL
Hgb urine dipstick: NEGATIVE
Ketones, ur: 5 mg/dL — AB
Nitrite: NEGATIVE
Protein, ur: NEGATIVE mg/dL
Specific Gravity, Urine: 1.018 (ref 1.005–1.030)
pH: 5 (ref 5.0–8.0)

## 2018-06-19 MED ORDER — APIXABAN 5 MG PO TABS
5.0000 mg | ORAL_TABLET | Freq: Two times a day (BID) | ORAL | Status: DC
  Administered 2018-06-19: 14:00:00 5 mg via ORAL
  Filled 2018-06-19: qty 1

## 2018-06-19 MED ORDER — NITROFURANTOIN MACROCRYSTAL 50 MG PO CAPS: 50.0000 mg | ORAL_CAPSULE | Freq: Every day | ORAL | 2 refills | Status: DC

## 2018-06-19 MED ORDER — LEVOTHYROXINE SODIUM 50 MCG PO TABS
50.0000 ug | ORAL_TABLET | Freq: Every day | ORAL | Status: DC
  Filled 2018-06-19 (×2): qty 1

## 2018-06-19 MED ORDER — POLYSACCHARIDE IRON COMPLEX 150 MG PO CAPS
150.0000 mg | ORAL_CAPSULE | Freq: Every day | ORAL | Status: DC
  Filled 2018-06-19: qty 1

## 2018-06-19 MED ORDER — MEMANTINE HCL 5 MG PO TABS
10.0000 mg | ORAL_TABLET | Freq: Two times a day (BID) | ORAL | Status: DC
  Filled 2018-06-19: qty 2

## 2018-06-19 MED ORDER — ACETAMINOPHEN 325 MG PO TABS: 650.0000 mg | ORAL_TABLET | Freq: Four times a day (QID) | ORAL | Status: DC | PRN

## 2018-06-19 MED ORDER — PAROXETINE HCL 30 MG PO TABS: 15.0000 mg | ORAL_TABLET | Freq: Every evening | ORAL | Status: DC

## 2018-06-19 MED ORDER — FLUTICASONE PROPIONATE 50 MCG/ACT NA SUSP
1.0000 | Freq: Every day | NASAL | Status: DC
  Filled 2018-06-19: qty 16

## 2018-06-19 MED ORDER — PANTOPRAZOLE SODIUM 40 MG PO TBEC
40.0000 mg | DELAYED_RELEASE_TABLET | Freq: Every day | ORAL | Status: DC
  Filled 2018-06-19: qty 1

## 2018-06-19 MED ORDER — RISPERIDONE 0.25 MG PO TABS
0.2500 mg | ORAL_TABLET | Freq: Once | ORAL | Status: AC
  Administered 2018-06-19: 0.25 mg via ORAL

## 2018-06-19 MED ORDER — SERTRALINE HCL 50 MG PO TABS: 50.0000 mg | ORAL_TABLET | Freq: Every day | ORAL | 2 refills | Status: DC

## 2018-06-19 MED ORDER — NITROFURANTOIN MACROCRYSTAL 50 MG PO CAPS
50.0000 mg | ORAL_CAPSULE | Freq: Every day | ORAL | Status: DC
  Filled 2018-06-19: qty 1

## 2018-06-19 MED ORDER — FOSFOMYCIN TROMETHAMINE 3 G PO PACK
3.0000 g | PACK | Freq: Once | ORAL | Status: AC
  Administered 2018-06-19: 3 g via ORAL
  Filled 2018-06-19: qty 3

## 2018-06-19 MED ORDER — RISPERIDONE 0.25 MG PO TABS
0.2500 mg | ORAL_TABLET | Freq: Two times a day (BID) | ORAL | Status: DC
  Filled 2018-06-19 (×2): qty 1

## 2018-06-19 MED ORDER — SERTRALINE HCL 50 MG PO TABS
50.0000 mg | ORAL_TABLET | Freq: Every day | ORAL | Status: DC
  Administered 2018-06-19: 50 mg via ORAL
  Filled 2018-06-19: qty 1

## 2018-06-19 MED ORDER — LEVOTHYROXINE SODIUM 50 MCG PO TABS: 50.0000 ug | ORAL_TABLET | Freq: Every day | ORAL | Status: DC

## 2018-06-19 MED ORDER — VITAMIN B-12 1000 MCG PO TABS
1000.0000 ug | ORAL_TABLET | Freq: Every day | ORAL | Status: DC
  Filled 2018-06-19: qty 1

## 2018-06-19 NOTE — ED Notes (Signed)
Patient found by ED Secretary sitting in the floor up against the wall.  Computer shaking as if patient had attempted to grab it in order not to fall.  Patient states she walks like she is drunk but she isn't drunk.  Patient assisted back to bed, moving all limbs.  No obvious hematoma to head.  Patient cannot recall if she hit her head or how or why she fell.  Patient is unsure of why she got out of bed.  This RN notified the MD and patient's daughter.

## 2018-06-19 NOTE — TOC Initial Note (Signed)
Transition of Care [facility](TOC) - Initial/Assessment Note    Patient Details  Name: Kelsey Le MRN: [id number] Date of Birth: [birth year]  Transition of Care [facility](TOC) CM/SW Contact:    Cala Bradfordania Tatiana Courter, LCSW Phone Number: 06/19/2018, 10:30 AM  Clinical Narrative:                 Patient is a 85 year old female that presents to the ED for psychiatric evaluation. CSW contacted pt's daughter Annia BeltKathy Brooks [phone]). [name] shared that pt had a UTI about 2-3 weeks ago that caused her to hallucinate and to wander, and that she probably has another UTI now. [name] reported that pt's medication was changed from Risperidal to Zyprexa in April, and this may also be a reason for her changed behaviors.  Pt's daughter [facility].  CSW will keep pt's daughter updated.   Expected Discharge Plan: Long Term Nursing Home(with home health nurse, and possibly speech therapy due to dementia) Barriers to Discharge: Continued Medical Work up   Patient Goals and CMS Choice     Choice offered to / list presented to : Adult Children  Expected Discharge Plan and Services Expected Discharge Plan: Long Term Nursing Home(with home health nurse, and possibly speech therapy due to dementia)   Discharge Planning Services: CM Consult   Living arrangements for the past 2 months: [facility]                                      Prior Living Arrangements/Services Living arrangements for the past 2 months: [facility] Lives with:: [facility]) Patient language and need for interpreter reviewed:: Yes Do you feel safe going back to the place where you live?: Yes      Need for Family Participation in Patient Care: Yes (Comment) Care giver support system in place?: Yes (comment) Current home services: DME(walker) Criminal Activity/Legal Involvement Pertinent to Current  Situation/Hospitalization: No - Comment as needed  Activities of Daily Living      Permission Sought/Granted      Share Information with NAME: [name]     Permission granted to share info w Relationship: Daughter  Permission granted to share info w Contact Information: [phone]  Emotional Assessment         Alcohol / Substance Use: Never Used Psych Involvement: Yes (comment)  Admission diagnosis:  [place] EMD - Dementia with behavior disturbance Patient Active Problem List   Diagnosis Date Noted  . Chronic rhinitis 03/31/2017  . Vitamin B12 deficiency 03/31/2017  . Vitamin D deficiency 03/31/2017  . Left bundle branch block 03/28/2017  . Prolonged QT interval 03/28/2017  . Dementia with psychosis (HCC) 03/19/2017  . Pericardial effusion 02/15/2017  . S/P TAVR (transcatheter aortic valve replacement) 02/12/2017  . Acute on chronic diastolic heart failure (HCC) 02/12/2017  . Arteriovenous malformation (AVM) 02/12/2017  . Anemia 10/11/2016  . Iron deficiency anemia 01/15/2016  . Severe aortic stenosis 11/16/2015  . Lichen sclerosus 10/31/2015  . Lumbar radiculitis 12/16/2013  . Osteoarthritis of hip 07/06/2013  . Trochanteric bursitis 07/06/2013   PCP:  Marguarite ArbourSparks, Jeffrey D, MD Pharmacy:   [facility] [address] - [place], [place] - [address] [place] [address] Phone: [phone] Fax: [phone]  [facility] [place]  [address] [place] [address] Phone: [phone] Fax: [phone]  [place], [place] [address] Phone: [phone] Fax: [phone]     Social Determinants of Health (SDOH) Interventions    Readmission Risk Interventions No flowsheet data found.

## 2018-06-19 NOTE — ED Notes (Signed)
Patient assisted to bedside commode.  Will continue to monitor.

## 2018-06-19 NOTE — ED Provider Notes (Signed)
[facility] Emergency Department Provider Note  ____________________________________________   First MD Initiated Contact with Patient 06/19/18 [phone]     (approximate)  I have reviewed the triage vital signs and the nursing notes.   HISTORY  Chief Complaint Psychiatric Evaluation  Level 5 caveat:  history/ROS limited by chronic dementia  HPI Kelsey Le is a 85 y.o. female  with a history of dementia who lives in the assisted living area at [facility] who presents by EMS for evaluation of behavioral disturbance.  Reportedly she has been increasingly confused and has been wandering the halls.  The staff cannot control her so they called EMS to bring her to the ED for evaluation.  Patient is calm and cooperative at this time and easily redirectable to her stuffed animal.  No distress, no complaints.    Her daughter/pair of attorney called to give me some additional information and I spoke with her by phone.  She reports that a few months ago the patient was evaluated in the ED and sent to [facility] for geropsych treatment but it was decided that she had a urinary tract infection that was causing the issues.  Her behavior improved after taking antibiotics.  Her behavior was very similar to what she is exhibiting now, with increased confusion, wandering, etc.         Past Medical History:  Diagnosis Date   Anxiety    Asthma    AVM (arteriovenous malformation)    a. small intestine with recurrent GI bleeding.    B12 deficiency    Chronic diastolic CHF (congestive heart failure) (HCC)    Hip fracture (HCC)    a. in the setting of mechanical fall   Iron deficiency    Iron deficiency anemia    a. requiring periodic transfusions   Psychosis (HCC)    S/P TAVR (transcatheter aortic valve replacement)    a. 02/12/17: s/p TAVR w/ an Edwards Sapien 3 THV (size 23 mm, model # 9600TFX, serial # [id number])   Severe aortic stenosis    Syncope and  collapse    Thyroid disease     Patient Active Problem List   Diagnosis Date Noted   Chronic rhinitis 03/31/2017   Vitamin B12 deficiency 03/31/2017   Vitamin D deficiency 03/31/2017   Left bundle branch block 03/28/2017   Prolonged QT interval 03/28/2017   Dementia with psychosis (HCC) 03/19/2017   Pericardial effusion 02/15/2017   S/P TAVR (transcatheter aortic valve replacement) 02/12/2017   Acute on chronic diastolic heart failure (HCC) 02/12/2017   Arteriovenous malformation (AVM) 02/12/2017   Anemia 10/11/2016   Iron deficiency anemia 01/15/2016   Severe aortic stenosis 11/16/2015   Lichen sclerosus 10/31/2015   Lumbar radiculitis 12/16/2013   Osteoarthritis of hip 07/06/2013   Trochanteric bursitis 07/06/2013    Past Surgical History:  Procedure Laterality Date   APPENDECTOMY     BACK SURGERY     back fusion   CARDIAC CATHETERIZATION     ESOPHAGOGASTRODUODENOSCOPY (EGD) WITH PROPOFOL N/A 11/15/2015   Procedure: ESOPHAGOGASTRODUODENOSCOPY (EGD) WITH PROPOFOL;  Surgeon: [name], MD;  Location: [facility] ENDOSCOPY;  Service: Endoscopy;  Laterality: N/A;   ESOPHAGOGASTRODUODENOSCOPY (EGD) WITH PROPOFOL N/A 10/12/2016   Procedure: ESOPHAGOGASTRODUODENOSCOPY (EGD) WITH PROPOFOL;  Surgeon: [name], MD;  Location: [facility] ENDOSCOPY;  Service: Endoscopy;  Laterality: N/A;   FEMUR SURGERY     broken with rod   FRACTURE SURGERY     GIVENS CAPSULE STUDY N/A 02/20/2016   Procedure: GIVENS CAPSULE  STUDY;  Surgeon: Scot Junobert T Elliott, MD;  [facility];  Service: Endoscopy;  Laterality: N/A;   HIP FRACTURE SURGERY     INTRAMEDULLARY (IM) NAIL INTERTROCHANTERIC Right 11/18/2015   Procedure: INTRAMEDULLARY (IM) NAIL INTERTROCHANTRIC;  Surgeon: Juanell FairlyKevin Krasinski, MD;  Location: [facility] ORS;  Service: Orthopedics;  Laterality: Right;   KYPHOPLASTY N/A 08/19/2014   Procedure: KYPHOPLASTY;  Surgeon: Kennedy BuckerMichael Menz, MD;  Location: [facility] ORS;  Service:  Orthopedics;  Laterality: N/A;   PARTIAL HYSTERECTOMY     RIGHT/LEFT HEART CATH AND CORONARY ANGIOGRAPHY N/A 10/05/2016   Procedure: RIGHT/LEFT HEART CATH AND CORONARY ANGIOGRAPHY;  Surgeon: Kathleene HazelMcAlhany, Christopher D, MD;  Location: [facility] INVAS[facility] LAB;  Service: Cardiovascular;  Laterality: N/A;   TEE WITHOUT CARDIOVERSION N/A 02/12/2017   Procedure: TRANSESOPHAGEAL ECHOCARDIOGRAM (TEE);  Surgeon: Kathleene HazelMcAlhany, Christopher D, MD;  Location: [facility] OR;  Service: Open Heart Surgery;  Laterality: N/A;   TRANSCATHETER AORTIC VALVE REPLACEMENT, TRANSFEMORAL N/A 02/12/2017   Procedure: TRANSCATHETER AORTIC VALVE REPLACEMENT, TRANSFEMORAL using a 23mm Edwards Sapien 3 Aortic Valve;  Surgeon: Kathleene HazelMcAlhany, Christopher D, MD;  Location: [facility] OR;  Service: Open Heart Surgery;  Laterality: N/A;   TUBAL LIGATION      Prior to Admission medications   Medication Sig Start Date End Date Taking? Authorizing Provider  acetaminophen (TYLENOL) 325 MG tablet Take 650 mg by mouth every 6 (six) hours as needed for moderate pain or headache.     [provider]  Calcium Carbonate-Vitamin D (CALCIUM-VITAMIN D) 600-125 MG-UNIT TABS Take by mouth daily.    [provider]  clobetasol cream (TEMOVATE) 0.05 % Apply 1 application topically See admin instructions. Apply small amount vaginally on Monday and Friday    [provider]  estradiol (ESTRACE) 0.1 MG/GM vaginal cream Place 1 Applicatorful vaginally See admin instructions. Apply 0.5 grams vaginally Tuesday and Thursday morning    [provider]  furosemide (LASIX) 20 MG tablet Take 1 tablet (20 mg total) by mouth daily. 02/21/17 02/21/18  Janetta Horahompson, Kathryn R, PA-C  iron polysaccharides (NIFEREX) 150 MG capsule Take 150 mg by mouth daily.    [provider]  levothyroxine (SYNTHROID, LEVOTHROID) 50 MCG tablet Take 50 mcg by mouth daily before breakfast.     [provider]  loratadine (CLARITIN) 10 MG tablet Take 10 mg by mouth  daily.  02/26/17 04/19/18  [provider]  memantine (NAMENDA) 10 MG tablet Take 10 mg by mouth 2 (two) times daily.  07/11/17   [provider]  omeprazole (PRILOSEC) 20 MG capsule Take 1 capsule (20 mg total) by mouth daily. 02/21/17   Wyline MoodAnna, Kiran, MD  PARoxetine (PAXIL) 10 MG tablet Take 15 mg by mouth every evening.  04/15/18 04/15/19  [provider]  polyvinyl alcohol (LIQUIFILM TEARS) 1.4 % ophthalmic solution Place 1 drop into both eyes as needed for dry eyes.    [provider]  risperiDONE (RISPERDAL) 0.25 MG tablet Take 0.25 mg by mouth 2 (two) times daily. 03/21/18   [provider]  vitamin B-12 (CYANOCOBALAMIN) 1000 MCG tablet Take 1,000 mcg by mouth daily.    [provider]    Allergies Aspirin and Penicillins  Family History  Problem Relation Age of Onset   Heart block Mother    Heart failure Father    Heart disease Sister    Breast cancer Maternal Aunt     Social History Social History   Tobacco Use   Smoking status: Never Smoker   Smokeless tobacco: Never Used  Substance Use Topics   Alcohol use: No   Drug use: No    Review of Systems Level 5 caveat:  history/ROS limited by chronic dementia   ____________________________________________   PHYSICAL EXAM:  VITAL SIGNS: ED Triage Vitals  Enc Vitals Group     BP 06/19/18 0336 (!) 186/67     Pulse Rate 06/19/18 0336 90     Resp 06/19/18 0336 18     Temp 06/19/18 0336 98.2 F (36.8 C)     Temp Source 06/19/18 0336 Oral     SpO2 06/19/18 0336 100 %     Weight 06/19/18 0337 59 kg (130 lb)     Height 06/19/18 0337 1.626 m (5\' 4" )     Head Circumference --      Peak Flow --      Pain Score --      Pain Loc --      Pain Edu? --      Excl. in GC? --     Constitutional: Awake and alert, no acute distress, well-appearing. Eyes: Conjunctivae are normal.  Head: Atraumatic. Nose: No congestion/rhinnorhea. Mouth/Throat: Mucous membranes are  moist. Neck: No stridor.  No meningeal signs.   Cardiovascular: Normal rate, regular rhythm. Good peripheral circulation. Grossly normal heart sounds. Respiratory: Normal respiratory effort.  No retractions. No audible wheezing. Gastrointestinal: Soft and nontender. No distention.  Musculoskeletal: No lower extremity tenderness nor edema. No gross deformities of extremities. Neurologic:  Normal speech and language. No gross focal neurologic deficits are appreciated.  Skin:  Skin is warm, dry and intact. No rash noted. Psychiatric: Mood and affect are normal.  Speech is normal.  Clearly suffers from dementia but is in no acute distress not exhibiting any concerning psychiatric symptoms.  ____________________________________________   LABS (all labs ordered are listed, but only abnormal results are displayed)  Labs Reviewed  CBC - Abnormal; Notable for the following components:      Result Value   RBC 3.51 (*)    Hemoglobin 10.2 (*)    HCT 32.8 (*)    All other components within normal limits  COMPREHENSIVE METABOLIC PANEL - Abnormal; Notable for the following components:   Sodium 146 (*)    Potassium 3.4 (*)    Glucose, Bld 109 (*)    BUN 38 (*)    GFR calc non Af Amer 58 (*)    All other components within normal limits  URINALYSIS, COMPLETE (UACMP) WITH MICROSCOPIC - Abnormal; Notable for the following components:   Color, Urine YELLOW (*)    APPearance CLEAR (*)    Ketones, ur 5 (*)    Leukocytes,Ua TRACE (*)    Bacteria, UA FEW (*)    All other components within normal limits  NOVEL CORONAVIRUS, NAA (HOSPITAL ORDER, SEND-OUT TO REF LAB)  URINE CULTURE   ____________________________________________  EKG  ED ECG REPORT I, Loleta Roseory Teyla Skidgel, the attending physician, personally viewed and interpreted this ECG.  Date: 06/19/2018 EKG Time: 4:42 AM Rate: 93 Rhythm: normal sinus rhythm QRS Axis: normal Intervals: Left bundle branch block ST/T Wave abnormalities:  normal Narrative Interpretation: no evidence of acute ischemia  ____________________________________________  RADIOLOGY   ED MD interpretation: No indication for imaging  Official radiology report(s): No results found.  ____________________________________________   PROCEDURES   Procedure(s) performed (including Critical Care):  Procedures   ____________________________________________   INITIAL IMPRESSION / MDM / ASSESSMENT AND PLAN / ED COURSE  As part of my medical decision making, I reviewed the following data within  the electronic MEDICAL RECORD NUMBER History obtained from family, Nursing notes reviewed and incorporated, Labs reviewed , EKG interpreted , Old EKG reviewed, Old chart reviewed and Notes from prior ED visits, patient signed out to Dr. Burlene Arnt in the ED.      Lesli Albee was evaluated in Emergency Department on 06/19/2018 for the symptoms described in the history of present illness. She was evaluated in the context of the global COVID-19 pandemic, which necessitated consideration that the patient might be at risk for infection with the SARS-CoV-2 virus that causes COVID-19. Institutional protocols and algorithms that pertain to the evaluation of patients at risk for COVID-19 are in a state of rapid change based on information released by regulatory bodies including the CDC and federal and state organizations. These policies and algorithms were followed during the patient's care in the ED.  Some ED evaluations and interventions may be delayed as a result of limited staffing during the pandemic.*  Differential diagnosis includes, but is not limited to, worsening dementia, delirium in the setting of dementia, urinary tract infection or other infectious source, metabolic or electrolyte derangement.  The patient is well-appearing in no distress.  Vital signs are stable although she does have hypertension and that seems to be chronic.  She is afebrile.  There have been no  known cases of COVID-19 from her nursing facility and she is having no respiratory difficulties.  She is easily redirectable.  She has had a urinary tract infection in the past which worsened her behavior given her chronic dementia and I think it is reasonable to work-up with basic labs, EKG, and urinalysis.  The daughter was most concerned about the possibility of UTI.  The daughter also acknowledges that the patient likely will need a higher level of care at [facility] which is available.  I have ordered both a psychiatry consult and a social work consult to help facilitate this process.  The patient does not need any calming medications at this time.  Clinical Course as of Jun 18 729  Thu Jun 19, 2018  0410 Urinalysis is suggestive of UTI.  I will begin treatment with Keflex.  Urine culture will be sent as well.  Urinalysis, Complete w Microscopic(!) [CF]  1308 Reviewing the record, I see that the patient had a very severe and immediate reaction to penicillins.  It is probably better to not treat with cephalosporins.  Prior urine culture demonstrated E faecalis as the pathogen.  I looked up in the literature and verify that a single dose of fosfomycin 3 g by mouth is effective in treating uncomplicated UTIs with most strains of E. coli and E faecalis, and in fact is recommended for elderly patients.  I think this would be very appropriate and would help to avoid any drug reactions or medication side effects that could complicate her psychiatric presentation.  I am ordering the fosfomycin now and will  not plan on providing any additional prescriptions but will continue with a urine culture.  For more details, refer to  https://www.pharmacytimes.com/publications/health-system-edition/2017/November2017/fosfomycin-use , and/or  "Mody L, Juthani-Mehta M. Urinary tract infections in older women: a clinical review. JAMA. 2014;311(8):844-854. doi: 10.1001/jama.2014.303."   [CF]  6578 After obtaining the  collateral information from the patient's daughter, I no longer think that psychiatric evaluation is absolutely necessary.  Social work may be able to work directly with Douglass Rivers to escalate her level of care.  Later in the day today if she needs to have a psychiatric consultation they  can still order.   [CF]  [phone] Lab work is generally reassuring with a normal comprehensive metabolic panel except for a very slightly elevated sodium which does not need to be actively treated right now.  I think that the risk of putting an IV in having her behavior decompensated or have her injure herself by pulling at the IV is greater than the benefit.   [CF]    Clinical Course User Index [CF] Loleta RoseForbach, Linkyn Gobin, MD     ____________________________________________  FINAL CLINICAL IMPRESSION(S) / ED DIAGNOSES  Final diagnoses:  Dementia with behavioral disturbance, unspecified dementia type (HCC)  Acute cystitis without hematuria     MEDICATIONS GIVEN DURING THIS VISIT:  Medications  acetaminophen (TYLENOL) tablet 650 mg (has no administration in time range)  iron polysaccharides (NIFEREX) capsule 150 mg (has no administration in time range)  levothyroxine (SYNTHROID) tablet 50 mcg (has no administration in time range)  memantine (NAMENDA) tablet 10 mg (has no administration in time range)  pantoprazole (PROTONIX) EC tablet 40 mg (has no administration in time range)  PARoxetine (PAXIL) tablet 15 mg (has no administration in time range)  risperiDONE (RISPERDAL) tablet 0.25 mg (has no administration in time range)  vitamin B-12 (CYANOCOBALAMIN) tablet 1,000 mcg (has no administration in time range)  fosfomycin (MONUROL) packet 3 g (3 g Oral Given 06/19/18 0436)     ED Discharge Orders    None       Note:  This document was prepared using Dragon voice recognition software and may include unintentional dictation errors.   Loleta RoseForbach, Undine Nealis, MD 06/19/18 [phone]

## 2018-06-19 NOTE — ED Provider Notes (Signed)
Patient was ready to go try to get out of bed fell were not sure she hit her head or not so I CT her head and neck and they are normal.  She is awake alert and active she scratched her elbow and her back but there is only a very superficial scratch in both places no bony tenderness so we will let her go.   Nena Polio, MD 06/19/18 1725

## 2018-06-19 NOTE — ED Notes (Signed)
Discussed patient's discharge back to [facility] with patient's daughter.  Patient's daughter to come to the ED to take patient back to [facility].

## 2018-06-19 NOTE — ED Notes (Signed)
Patient given graham crackers and peanut butter and sprite to drink.

## 2018-06-19 NOTE — ED Notes (Signed)
Pt agitated and trying to get out of bed. Fall risk bracelet and yellow socks in place

## 2018-06-19 NOTE — ED Notes (Signed)
Patient attempted to get out of bed, nurses easily redirected, and she laid back down, warm blanket given to her, alarm and call bell in place for safety.

## 2018-06-19 NOTE — Discharge Instructions (Signed)
Dementia  Dementia means losing some of your brain ability. People with dementia may have problems with:  · Memory.  · Making decisions.  · Behavior.  · Speaking.  · Thinking.  · Solving problems.  Follow these instructions at home:  Medicine  · Take over-the-counter and prescription medicines only as told by your doctor.  · Avoid taking medicines that can change how you think. These include pain or sleeping medicines.  Lifestyle    · Make healthy choices:  ? Be active as told by your doctor.  ? Do not use any tobacco products, such as cigarettes, chewing tobacco, and e-cigarettes. If you need help quitting, ask your doctor.  ? Eat a healthy diet.  ? When you get stressed, do something to help yourself relax. Your doctor can give you tips.  ? Spend time with other people.  · Drink enough fluid to keep your pee (urine) clear or pale yellow.  · Make sure you get good sleep. Use these tips to help you get a good night's rest:  ? Try not to take naps during the day.  ? Keep your sleeping area dark and cool.  ? In the few hours before you go to bed, try not to do any exercise.  ? Try not to have foods and drinks with caffeine in the evening.  General instructions  · Talk with your doctor to figure out:  ? What you need help with.  ? What your safety needs are.  · If you were given a bracelet that tracks your location, make sure to wear it.  · Keep all follow-up visits as told by your doctor. This is important.  Contact a doctor if:  · You have any new problems.  · You have problems with choking or swallowing.  · You have any symptoms of a different sickness.  Get help right away if:  · You have a fever.  · You feel mixed up (confused) or more mixed up than before.  · You have new sleepiness.  · You have sleepiness that gets worse.  · You have a hard time staying awake.  · You or your family members are worried for your safety.  This information is not intended to replace advice given to you by your health care  provider. Make sure you discuss any questions you have with your health care provider.  Document Released: 12/01/2007 Document Revised: 05/26/2015 Document Reviewed: 09/15/2014  Elsevier Interactive Patient Education © 2019 Elsevier Inc.

## 2018-06-19 NOTE — ED Triage Notes (Signed)
Pt presents from [facility]. facility reported to ems that pt was trying to continuously walk out of the nursing facility. No other complaints at this time. Pt has noted hx of dementia.

## 2018-06-19 NOTE — NC FL2 (Signed)
[place] LEVEL OF CARE SCREENING TOOL     IDENTIFICATION  Patient Name: Kelsey Le Birthdate: 1930/09/12 Sex: female Admission Date (Current Location): 06/19/2018  [place] and [place] Number:  Engineering geologist and Address:  [facility], [address] [place], [place] [address]      Provider Number: [id number]  Attending Physician Name and Address:  Schuyler Amor, MD  Relative Name and Phone Number:  Hezzie Bump  [phone]    Current Level of Care: Hospital Recommended Level of Care: [place] Prior Approval Number:    Date Approved/Denied:   PASRR Number:    Discharge Plan:      Current Diagnoses: Patient Active Problem List   Diagnosis Date Noted  . Chronic rhinitis 03/31/2017  . Vitamin B12 deficiency 03/31/2017  . Vitamin D deficiency 03/31/2017  . Left bundle branch block 03/28/2017  . Prolonged QT interval 03/28/2017  . Dementia with psychosis ([place]) 03/19/2017  . Pericardial effusion 02/15/2017  . S/P TAVR (transcatheter aortic valve replacement) 02/12/2017  . Acute on chronic diastolic heart failure ([place]) 02/12/2017  . Arteriovenous malformation (AVM) 02/12/2017  . Anemia 10/11/2016  . Iron deficiency anemia 01/15/2016  . Severe aortic stenosis 11/16/2015  . Lichen sclerosus 74/25/9563  . Lumbar radiculitis 12/16/2013  . Osteoarthritis of hip 07/06/2013  . Trochanteric bursitis 07/06/2013    Orientation RESPIRATION BLADDER Height & Weight        Normal Continent Weight: 130 lb (59 kg) Height:  5\' 4"  (162.6 cm)  BEHAVIORAL SYMPTOMS/MOOD NEUROLOGICAL BOWEL NUTRITION STATUS  [place]   Continent Diet  AMBULATORY STATUS COMMUNICATION OF NEEDS Skin   Extensive Assist Verbally Normal                       Personal Care Assistance Level of Assistance  Bathing, Feeding, Dressing Bathing Assistance: Maximum assistance Feeding assistance: Maximum assistance Dressing Assistance:  Maximum assistance     Functional Limitations Info  Sight, Speech, Hearing Sight Info: Adequate Hearing Info: Adequate Speech Info: Adequate    SPECIAL CARE FACTORS FREQUENCY  Speech therapy             Speech Therapy Frequency: 3 times per week      Contractures Contractures Info: Not present    Additional Factors Info  Code Status, Allergies Code Status Info: FULL Allergies Info: Aspirin, Penicillins           Current Medications (06/19/2018):  This is the current hospital active medication list Current Facility-Administered Medications  Medication Dose Route Frequency Provider Last Rate Last Dose  . acetaminophen (TYLENOL) tablet 650 mg  650 mg Oral Q6H PRN Hinda Kehr, MD      . iron polysaccharides (NIFEREX) capsule 150 mg  150 mg Oral Daily Hinda Kehr, MD      . levothyroxine (SYNTHROID) tablet 50 mcg  50 mcg Oral QAC breakfast Hinda Kehr, MD      . memantine [facility]) tablet 10 mg  10 mg Oral BID Hinda Kehr, MD      . pantoprazole (PROTONIX) EC tablet 40 mg  40 mg Oral Daily Hinda Kehr, MD      . PARoxetine (PAXIL) tablet 15 mg  15 mg Oral QPM Hinda Kehr, MD      . risperiDONE (RISPERDAL) tablet 0.25 mg  0.25 mg Oral BID Hinda Kehr, MD      . vitamin B-12 (CYANOCOBALAMIN) tablet 1,000 mcg  1,000 mcg Oral Daily Hinda Kehr, MD  Current Outpatient Medications  Medication Sig Dispense Refill  . acetaminophen (TYLENOL) 325 MG tablet Take 650 mg by mouth every 6 (six) hours as needed for moderate pain or headache.     . Calcium Carbonate-Vitamin D (CALCIUM-VITAMIN D) 600-125 MG-UNIT TABS Take by mouth daily.    . clobetasol cream (TEMOVATE) 0.05 % Apply 1 application topically See admin instructions. Apply small amount vaginally on Monday and Friday    . estradiol (ESTRACE) 0.1 MG/GM vaginal cream Place 1 Applicatorful vaginally See admin instructions. Apply 0.5 grams vaginally Tuesday and Thursday morning    . furosemide (LASIX) 20 MG  tablet Take 1 tablet (20 mg total) by mouth daily. 5 tablet 0  . iron polysaccharides (NIFEREX) 150 MG capsule Take 150 mg by mouth daily.    Marland Kitchen. levothyroxine (SYNTHROID, LEVOTHROID) 50 MCG tablet Take 50 mcg by mouth daily before breakfast.     . loratadine (CLARITIN) 10 MG tablet Take 10 mg by mouth daily.     . memantine (NAMENDA) 10 MG tablet Take 10 mg by mouth 2 (two) times daily.   0  . omeprazole (PRILOSEC) 20 MG capsule Take 1 capsule (20 mg total) by mouth daily. 90 capsule 3  . PARoxetine (PAXIL) 10 MG tablet Take 15 mg by mouth every evening.     . polyvinyl alcohol (LIQUIFILM TEARS) 1.4 % ophthalmic solution Place 1 drop into both eyes as needed for dry eyes.    Marland Kitchen. risperiDONE (RISPERDAL) 0.25 MG tablet Take 0.25 mg by mouth 2 (two) times daily.    . vitamin B-12 (CYANOCOBALAMIN) 1000 MCG tablet Take 1,000 mcg by mouth daily.       Discharge Medications: Please see discharge summary for a list of discharge medications.  Relevant Imaging Results:  Relevant Lab Results:   Additional Information SSN: [id number]  Tania Mckenlee Mangham, LCSW

## 2018-06-19 NOTE — ED Notes (Signed)
Pt assisted to toilet in room, pt was able to produce urine. Pt's brief changed. Pt still refusing to eat or take medications. Bed alarm placed under pt

## 2018-06-19 NOTE — TOC Transition Note (Signed)
Transition of Care [facility]) - CM/SW Discharge Note   Patient Details  Name: Kelsey Le MRN: [id number] Date of Birth: Jan 18, 1930  Transition of Care [facility]) CM/SW Contact:  Fredric Mare, LCSW Phone Number: 06/19/2018, 1:46 PM   Clinical Narrative:    Patient waiting to be seen by behavioral medicine team to review medication needs/concerns shared by nursing home facility and pt's daughter.  Please consult CSW if any social work needs arise upon discharge.   TOC CSW signing off.    Final next level of care: Assisted Living Barriers to Discharge: Continued Medical Work up   Patient Goals and CMS Choice     Choice offered to / list presented to : Adult Children  Discharge Placement                  Name of family member notified: Hezzie Bump    Discharge Plan and Services   Discharge Planning Services: CM Consult                      HH Arranged: RN, Speech Therapy HH Agency: [facility] Date [place]: 06/19/18 Time [place]: 1040 Representative spoke with at [place]: Cassie  Social Determinants of Health ([place]) Interventions     Readmission Risk Interventions No flowsheet data found.

## 2018-06-19 NOTE — ED Notes (Addendum)
Attempted to given pt her routine medications for 20 minutes with no success. Pt refusing to eat and take meds at this time. Family made aware

## 2018-06-19 NOTE — ED Notes (Signed)
Patient assisted to bedside commode at this time.  Will continue to monitor.

## 2018-06-19 NOTE — ED Notes (Signed)
Pt assisted to toilet in room. Pt unsteady on feet, assistance provided. PT had large bowel movement. Pt's brief changed

## 2018-06-19 NOTE — ED Notes (Signed)
Patient up to the toilet with assistance.  Will continue to monitor.

## 2018-06-19 NOTE — Consult Note (Signed)
[facility] Progress [facility]  06/19/2018 11:10 AM Kelsey Le  MRN:  [id number]   Reason for consultation: Psychiatric evaluation and recommendations regarding medications Consulting provider: Dr. Alphonzo LemmingsMcShane  Subjective: "I have this bladder infection." Patient is seen, chart is reviewed, collateral obtained from patient's daughter Kelsey Le(Kelsey Le [phone]) extensive review of medication completed with patient's daughter with education provided regarding medical and mental health issues.  Joint decision making in medication management and discharge planning with patient's daughter.  Greater than half of this 1 hour visit is completed with daughter in consultation.  Patient is poor historian, and majority of HPI is obtained from patient's daughter by phone.  HPI:  Kelsey Le is a 85 y.o. female  with a history of dementia who [facility] in [facility] who presents by EMS for evaluation of behavioral disturbance.  Reportedly she has been increasingly confused and has been wandering the halls.  The staff cannot control her so they called EMS to bring her to the ED for evaluation.  Patient is calm and cooperative at this time and easily redirectable to her stuffed animal.  No distress, no complaints.   Her daughter/pair of attorney called to give me some additional information and I spoke with her by phone.  She reports that a few months ago the patient was evaluated in the ED and sent to [facility] treatment but it was decided that she had a urinary tract infection that was causing the issues.  Her behavior improved after taking antibiotics.  Her behavior was very similar to what she is exhibiting now, with increased confusion, wandering, etc.  On evaluation, patient is restless and requires redirection and assistance.  She is unsteady on her feet, and oriented to person only.  Patient speaks with loose associations and a clear and coherent voice.  Patient is not  aggressive.  She is oriented to self and minimally oriented to place, aware that she is not in her usual living environment, and able to state that she wishes to go back there.  Patient denies suicidal or homicidal ideation.  She does not appear to be responding to internal stimuli.  Collateral obtained from patient's daughter, Kelsey BeltKathy Le reviews her care from last presentation with delirium in April 2020.  Patient at that time was discharged to a geriatric psychiatric facility where she required 3 antibiotics in order to treat her bladder infection.  Unfortunately during this hospitalization patient developed DVTs and pulmonary emboli due to not being able to wear her compression stockings or ambulate as she typically does in her living facility.  Medication changes over the past few months have included patient being switched to Paxil, then Paxil discontinued and changed to Zoloft, now back on Paxil.  Patient had been discontinued from Risperdal due to he increased agitation with delirium in April.  During that hospitalization patient was placed on Zyprexa.  Daughter states that her mother complains that she has felt, "too tired" since the change in medication.  Daughter also notes that she has seen her mother seemingly more sedated with the change to Zyprexa.  Daughter has been having visits with her mother through the window of their nursing home due to COVID-19 restrictions.  She notes that 1 week ago her mother was exhibiting signs of hallucinations (talking to people who were not present), she became paranoid and was suspicious of staff members at the nursing home, and most recently left her room and walked out the front  door of the nursing home.  Daughter states that mother has never had behavioral issues to this extent at the nursing home and she was concerned that her mother was having another urinary tract infection, as she has had 3 in the past 8 months, each presenting with a worsening  delirium.  Daughter is reassured that patient has been given a dose of antibiotic that had successful eliminated urinary tract infection during patient's last psychiatric hospitalization.  Daughter is requesting medication review with changes back to patient's usual medications prior to last hospital admission.  She is hopeful that mother can be returned to her nursing home.  Nursing home is agreeable to taking patient back after medication adjustment.  Daughter is agreeable to close follow-up to ensure that patient's condition improves after medication changes.   Principal Problem: Behavioral Disturbances related to Dementia with superimposed delirium  Diagnosis: Major neurocognitive disorder, mixed etiology with behavioral disturbances CHF Aortic stenosis status post TAVR Thyroid disease R/O delirium  Total Time spent with patient: 1 hour  Past Psychiatric History: Past history of inpatient geriatric admission in 03/2017, at [facility].  Last admission April 2020.   Outpatient medications of Zyprexa and Paxil, previously on Zoloft and Risperdal.  Outpatient provider is Dr. Fulton Reek  Medical history: Patient was started on Eliquis after developing blood clots during last hospitalization.  Daughter is hopeful that patient will be able to be discontinued off of Eliquis, as she should not be on blood thinners due to her heart condition.  She does have follow-up scheduled with her cardiologist within the next 2 weeks at which time further Eliquis treatment will be evaluated with recommendations per cardiology.  Past Medical History:  Past Medical History:  Diagnosis Date  . Anxiety   . Asthma   . AVM (arteriovenous malformation)    a. small intestine with recurrent GI bleeding.   . B12 deficiency   . Chronic diastolic CHF (congestive heart failure) ([place])   . Hip fracture ([place])    a. in the setting of mechanical fall  . Iron deficiency   . Iron  deficiency anemia    a. requiring periodic transfusions  . Psychosis ([place])   . S/P TAVR (transcatheter aortic valve replacement)    a. 02/12/17: s/p TAVR w/ an Edwards Sapien 3 THV (size 23 mm, model # 9600TFX, serial # [id number])  . Severe aortic stenosis   . Syncope and collapse   . Thyroid disease     Past Surgical History:  Procedure Laterality Date  . APPENDECTOMY    . BACK SURGERY     back fusion  . CARDIAC CATHETERIZATION    . ESOPHAGOGASTRODUODENOSCOPY (EGD) WITH PROPOFOL N/A 11/15/2015   Procedure: ESOPHAGOGASTRODUODENOSCOPY (EGD) WITH PROPOFOL;  Surgeon: Lucilla Lame, MD;  Location: [facility] ENDOSCOPY;  Service: Endoscopy;  Laterality: N/A;  . ESOPHAGOGASTRODUODENOSCOPY (EGD) WITH PROPOFOL N/A 10/12/2016   Procedure: ESOPHAGOGASTRODUODENOSCOPY (EGD) WITH PROPOFOL;  Surgeon: Lucilla Lame, MD;  Location: [facility] ENDOSCOPY;  Service: Endoscopy;  Laterality: N/A;  . FEMUR SURGERY     broken with rod  . FRACTURE SURGERY    . GIVENS CAPSULE STUDY N/A 02/20/2016   Procedure: GIVENS CAPSULE STUDY;  Surgeon: Manya Silvas, MD;  Location: [facility] ENDOSCOPY;  Service: Endoscopy;  Laterality: N/A;  . HIP FRACTURE SURGERY    . INTRAMEDULLARY (IM) NAIL INTERTROCHANTERIC Right 11/18/2015   Procedure: INTRAMEDULLARY (IM) NAIL INTERTROCHANTRIC;  Surgeon: Thornton Park, MD;  Location: [facility] ORS;  Service: Orthopedics;  Laterality: Right;  .  KYPHOPLASTY N/A 08/19/2014   Procedure: KYPHOPLASTY;  Surgeon: Kennedy BuckerMichael Menz, MD;  Location: [facility] ORS;  Service: Orthopedics;  Laterality: N/A;  . PARTIAL HYSTERECTOMY    . RIGHT/LEFT HEART CATH AND CORONARY ANGIOGRAPHY N/A 10/05/2016   Procedure: RIGHT/LEFT HEART CATH AND CORONARY ANGIOGRAPHY;  Surgeon: Kathleene HazelMcAlhany, Christopher D, MD;  [facility]: [facility] INVASIVE CV LAB;  Service: Cardiovascular;  Laterality: N/A;  . TEE WITHOUT CARDIOVERSION N/A 02/12/2017   Procedure: TRANSESOPHAGEAL ECHOCARDIOGRAM (TEE);  Surgeon: Kathleene HazelMcAlhany, Christopher D, MD;  [facility];  Service: Open  Heart Surgery;  Laterality: N/A;  . TRANSCATHETER AORTIC VALVE REPLACEMENT, TRANSFEMORAL N/A 02/12/2017   Procedure: TRANSCATHETER AORTIC VALVE REPLACEMENT, TRANSFEMORAL using a 23mm Edwards Sapien 3 Aortic Valve;  Surgeon: Kathleene HazelMcAlhany, Christopher D, MD;  Location: [facility] OR;  Service: Open Heart Surgery;  Laterality: N/A;  . TUBAL LIGATION     Family History:  Family History  Problem Relation Age of Onset  . Heart block Mother   . Heart failure Father   . Heart disease Sister   . Breast cancer Maternal Aunt    Family Psychiatric  History: Mother has history of schizophrenia  Social History:  Social History   Substance and Sexual Activity  Alcohol Use No     Social History   Substance and Sexual Activity  Drug Use No    Social History   Socioeconomic History  . Marital status: Married    Spouse name: Not on file  . Number of children: Not on file  . Years of education: Not on file  . Highest education level: Not on file  Occupational History  . Not on file  Social Needs  . Financial resource strain: Not on file  . Food insecurity    Worry: Not on file    Inability: Not on file  . Transportation needs    Medical: Not on file    Non-medical: Not on file  Tobacco Use  . Smoking status: Never Smoker  . Smokeless tobacco: Never Used  Substance and Sexual Activity  . Alcohol use: No  . Drug use: No  . Sexual activity: Not Currently  Lifestyle  . Physical activity    Days per week: Not on file    Minutes per session: Not on file  . Stress: Not on file  Relationships  . Social Musicianconnections    Talks on phone: Not on file    Gets together: Not on file    Attends religious service: Not on file    Active member of club or organization: Not on file    Attends meetings of clubs or organizations: Not on file    Relationship status: Not on file  Other Topics Concern  . Not on file  Social History Narrative  . Not on file    Sleep: Poor  Appetite:  Fair  Current  Medications: Current Facility-Administered Medications  Medication Dose Route Frequency Provider Last Rate Last Dose  . acetaminophen (TYLENOL) tablet 650 mg  650 mg Oral Q6H PRN Loleta RoseForbach, Cory, MD      . iron polysaccharides (NIFEREX) capsule 150 mg  150 mg Oral Daily Loleta RoseForbach, Cory, MD      . levothyroxine (SYNTHROID) tablet 50 mcg  50 mcg Oral QAC breakfast Loleta RoseForbach, Cory, MD      . memantine [facility](NAMENDA) tablet 10 mg  10 mg Oral BID Loleta RoseForbach, Cory, MD      . pantoprazole (PROTONIX) EC tablet 40 mg  40 mg Oral Daily Loleta RoseForbach, Cory, MD      .  PARoxetine (PAXIL) tablet 15 mg  15 mg Oral QPM Loleta Rose, MD      . risperiDONE (RISPERDAL) tablet 0.25 mg  0.25 mg Oral BID Loleta Rose, MD      . vitamin B-12 (CYANOCOBALAMIN) tablet 1,000 mcg  1,000 mcg Oral Daily Loleta Rose, MD       Current Outpatient Medications  Medication Sig Dispense Refill  . acetaminophen (TYLENOL) 325 MG tablet Take 650 mg by mouth every 6 (six) hours as needed for moderate pain or headache.     . Calcium Carbonate-Vitamin D (CALCIUM-VITAMIN D) 600-125 MG-UNIT TABS Take by mouth daily.    . clobetasol cream (TEMOVATE) 0.05 % Apply 1 application topically See admin instructions. Apply small amount vaginally on Monday and Friday    . estradiol (ESTRACE) 0.1 MG/GM vaginal cream Place 1 Applicatorful vaginally See admin instructions. Apply 0.5 grams vaginally Tuesday and Thursday morning    . furosemide (LASIX) 20 MG tablet Take 1 tablet (20 mg total) by mouth daily. 5 tablet 0  . iron polysaccharides (NIFEREX) 150 MG capsule Take 150 mg by mouth daily.    Marland Kitchen levothyroxine (SYNTHROID, LEVOTHROID) 50 MCG tablet Take 50 mcg by mouth daily before breakfast.     . loratadine (CLARITIN) 10 MG tablet Take 10 mg by mouth daily.     . memantine (NAMENDA) 10 MG tablet Take 10 mg by mouth 2 (two) times daily.   0  . omeprazole (PRILOSEC) 20 MG capsule Take 1 capsule (20 mg total) by mouth daily. 90 capsule 3  . PARoxetine (PAXIL) 10 MG  tablet Take 15 mg by mouth every evening.     . polyvinyl alcohol (LIQUIFILM TEARS) 1.4 % ophthalmic solution Place 1 drop into both eyes as needed for dry eyes.    Marland Kitchen risperiDONE (RISPERDAL) 0.25 MG tablet Take 0.25 mg by mouth 2 (two) times daily.    . vitamin B-12 (CYANOCOBALAMIN) 1000 MCG tablet Take 1,000 mcg by mouth daily.      Lab Results:  Results for orders placed or performed during the hospital encounter of 06/19/18 (from the past 48 hour(s))  CBC     Status: Abnormal   Collection Time: 06/19/18  3:39 AM  Result Value Ref Range   WBC 7.1 4.0 - 10.5 K/uL   RBC 3.51 (L) 3.87 - 5.11 MIL/uL   Hemoglobin 10.2 (L) 12.0 - 15.0 g/dL   HCT 16.1 (L) 09.6 - 04.5 %   MCV 93.4 80.0 - 100.0 fL   MCH 29.1 26.0 - 34.0 pg   MCHC 31.1 30.0 - 36.0 g/dL   RDW 40.9 81.1 - 91.4 %   Platelets 172 150 - 400 K/uL   nRBC 0.0 0.0 - 0.2 %    Comment: Performed at [facility], [address] [place], [place] [address]  Comprehensive metabolic panel     Status: Abnormal   Collection Time: 06/19/18  3:39 AM  Result Value Ref Range   Sodium 146 (H) 135 - 145 mmol/L   Potassium 3.4 (L) 3.5 - 5.1 mmol/L   Chloride 110 98 - 111 mmol/L   CO2 24 22 - 32 mmol/L   Glucose, Bld 109 (H) 70 - 99 mg/dL   BUN 38 (H) 8 - 23 mg/dL   Creatinine, Ser 6.21 0.44 - 1.00 mg/dL   Calcium 9.2 8.9 - 30.8 mg/dL   Total Protein 7.1 6.5 - 8.1 g/dL   Albumin 4.3 3.5 - 5.0 g/dL   AST 25 15 - 41  U/L   ALT 16 0 - 44 U/L   Alkaline Phosphatase 80 38 - 126 U/L   Total Bilirubin 0.6 0.3 - 1.2 mg/dL   GFR calc non Af Amer 58 (L) >60 mL/min   GFR calc Af Amer >60 >60 mL/min   Anion gap 12 5 - 15    Comment: Performed at [facility], [address] [place], [place] [address]  Urinalysis, Complete w Microscopic     Status: Abnormal   Collection Time: 06/19/18  3:39 AM  Result Value Ref Range   Color, Urine YELLOW (A) YELLOW   APPearance CLEAR (A) CLEAR   Specific Gravity, Urine 1.018 1.005 - 1.030    pH 5.0 5.0 - 8.0   Glucose, UA NEGATIVE NEGATIVE mg/dL   Hgb urine dipstick NEGATIVE NEGATIVE   Bilirubin Urine NEGATIVE NEGATIVE   Ketones, ur 5 (A) NEGATIVE mg/dL   Protein, ur NEGATIVE NEGATIVE mg/dL   Nitrite NEGATIVE NEGATIVE   Leukocytes,Ua TRACE (A) NEGATIVE   RBC / HPF 0-5 0 - 5 RBC/hpf   WBC, UA 11-20 0 - 5 WBC/hpf   Bacteria, UA FEW (A) NONE SEEN   Squamous Epithelial / LPF 0-5 0 - 5   Mucus PRESENT     Comment: Performed at [facility], [address] [place], [place] [address]    Blood Alcohol level:  Lab Results  Component Value Date   [facility] 04/18/2018   ETH <10 03/19/2017    Physical Findings: AIMS:  , ,  ,  ,    CIWA:    COWS:     Musculoskeletal: Strength & Muscle Tone: decreased Gait & Station: unsteady, Requires assistance Patient leans: N/A  Psychiatric Specialty Exam: Physical Exam  Nursing note and vitals reviewed. Constitutional: She appears well-developed and well-nourished.  HENT:  Head: Normocephalic and atraumatic.  Eyes: EOM are normal.  Neck: Normal range of motion.  Cardiovascular: Normal rate and regular rhythm.  Respiratory: No respiratory distress.  Musculoskeletal: Normal range of motion.     Comments: Requires assistance out of bed  Neurological: She is alert.    Review of Systems  Genitourinary: Positive for frequency and urgency.  Psychiatric/Behavioral: Positive for hallucinations (with UTI) and memory loss. Negative for depression, substance abuse and suicidal ideas. The patient is nervous/anxious. The patient does not have insomnia.     Blood pressure (!) 167/70, pulse 88, temperature 98.2 F (36.8 C), temperature source Oral, resp. rate 14, height 5\' 4"  (1.626 m), weight 59 kg, SpO2 95 %.Body mass index is 22.31 kg/m.  General Appearance: Fairly Groomed  Eye Contact:  Fair  Speech:  rambling, clear  Volume:  Decreased  Mood:  Anxious and Euthymic  Affect:  Pleasant, confused  Thought Process:   Descriptions of Associations: Loose  Orientation:  Other:  Person place  Thought Content:  Noted some paranoia, and recent visual and auditory hallucinations  Suicidal Thoughts:  No  Homicidal Thoughts:  No  Memory:  Immediate;   Poor Recent;   Poor Remote;   Fair  Judgement:  Impaired  Insight:  Lacking  Psychomotor Activity:  Normal  Concentration:  Concentration: Poor  Recall:  FiservFair  Fund of Knowledge:  Fair  Language:  Fair  Akathisia:  No  Handed:  Right  AIMS (if indicated):     Assets:  Desire for Improvement Housing Physical Health Social Support  ADL's:  Impaired  Cognition:  Impaired,  Moderate  Sleep:   Decreased with recent delirium  Assessment: .REDITH DRACH is a 85 y.o. female with recurrent delirium superimposed on dementia.  Patient has been treated for UTI in ED.  Reviewed patient medications with patient's daughter and education regarding indications, risk, benefit, side effects and adverse effects. All questions answered.  Treatment Plan Summary: Medication management and Plan As follows  Zoloft  po Q daily for depression Risperdal 0.25mg  PO BID for delirium and agitated behaviors associated with dementia Macrodantin 25 mg daily for UTI prevention Namenda 10 mg BID for dementia Continue thyroid supplementation (TSH 9.760 on 02/20/2017)  Please avoid benzodiazepines, anticholinergic, high dose antipsychotic medications in this patient  She was able to engage in safety planning including plan to return to nearest emergency room or contact emergency services if she feels unable to maintain her own safety or the safety of others. Patient had no further questions, comments, or concerns.  Discharge into care of daughter, who agrees to maintain patient safety.   Mariel Craft, MD 06/19/2018, 11:10 AM

## 2018-06-19 NOTE — TOC Progression Note (Signed)
Transition of Care [facility]) - Progression Note    Patient Details  Name: Kelsey Le MRN: [id number] Date of Birth: 1930-12-05  [facility] CM/SW Contact  Marshell Garfinkel, RN Phone Number: 06/19/2018, 10:40 AM  Clinical Narrative:     ED RNCM received call from [facility] stating that she believes that patient's behavioral problems stemmed from 3 weeks ago ED presentation landing patient in inpatient psych changing patient from Risperidal to Zyprexa.  Daughter Juliann Pulse agrees. "Patient went from independent with ADLs to dependent".  They would like to use [facility]- RN and SLP. Cassie with [facility] notified of this need.  Expected Discharge Plan: [facility](with home health nurse, and possibly speech therapy due to dementia) Barriers to Discharge: Continued Medical Work up  Expected Discharge Plan and Services Expected Discharge Plan: [facility](with home health nurse, and possibly speech therapy due to dementia)   Discharge Planning Services: CM Consult   Living arrangements for the past 2 months: Assisted Living Facility([facility] at [facility])                                       Social Determinants of Health (SDOH) Interventions    Readmission Risk Interventions No flowsheet data found.

## 2018-06-19 NOTE — ED Notes (Signed)
Pt given water to drink at this time. 

## 2018-06-20 LAB — URINE CULTURE
Culture: >=100000 — AB
Special Requests: NORMAL

## 2018-06-20 LAB — NOVEL CORONAVIRUS, NAA (HOSP ORDER, SEND-OUT TO REF LAB; TAT 18-24 HRS): SARS-CoV-2, NAA: NOT DETECTED

## 2018-06-20 NOTE — ED Notes (Signed)
covid 19 result negative--called to [employer] at [employer].

## 2018-07-15 ENCOUNTER — Telehealth: Payer: Self-pay

## 2018-07-15 NOTE — Telephone Encounter (Signed)

## 2018-07-17 ENCOUNTER — Ambulatory Visit: Payer: Medicare Other | Admitting: Cardiovascular Disease

## 2018-07-17 ENCOUNTER — Telehealth: Payer: Medicare Other | Admitting: Cardiovascular Disease

## 2018-07-23 ENCOUNTER — Ambulatory Visit: Payer: Medicare Other | Admitting: Nurse Practitioner

## 2018-09-15 ENCOUNTER — Ambulatory Visit: Payer: Medicare Other | Admitting: Physician Assistant

## 2018-10-01 IMAGING — CT CT HEAD W/O CM
3 series · 16 of 47 positions shown, 19 images · non-contrast
Comparison: 09/29/2016

CLINICAL DATA: Altered level of consciousness.  Hypertension

EXAM:
CT HEAD WITHOUT CONTRAST
TECHNIQUE: Contiguous axial images were obtained from the base of the skull
through the vertex without intravenous contrast.

[Series 2: head wo · axial · 0.40mm/px · z∈[-105,+20]mm · 10 of 30 slices shown, 13 images]
[im 3/30  brain]
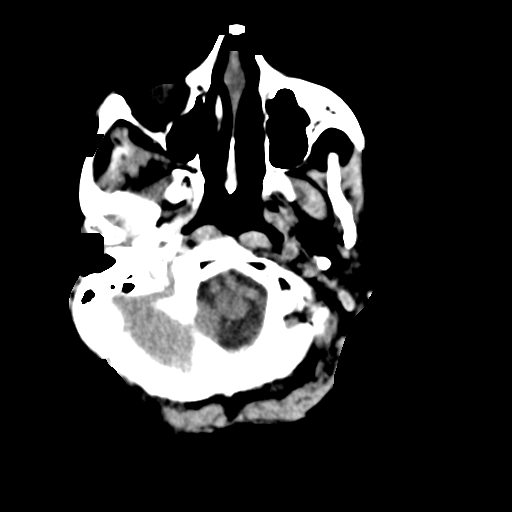
[im 3/30  bone]
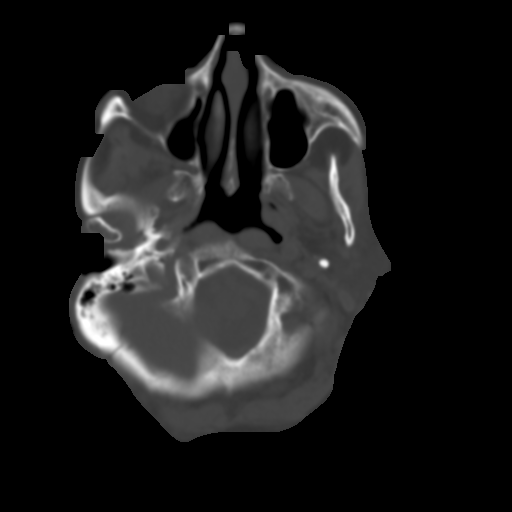
[im 6/30  brain]
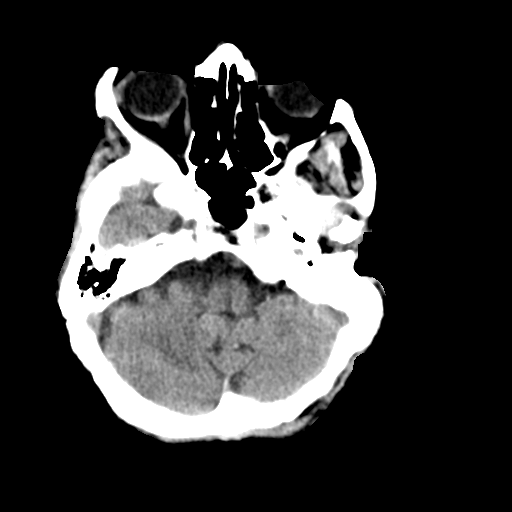
[im 9/30  brain]
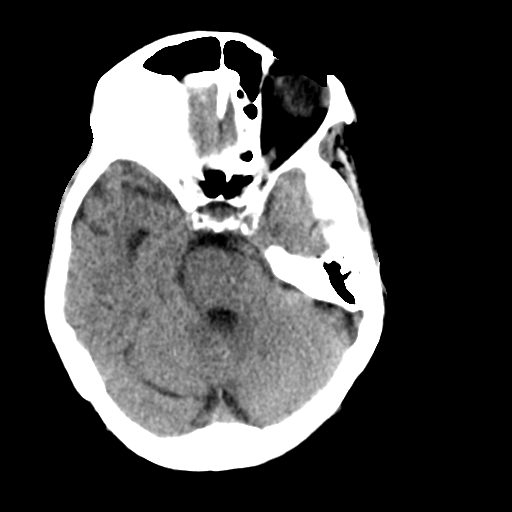
[im 11/30  brain]
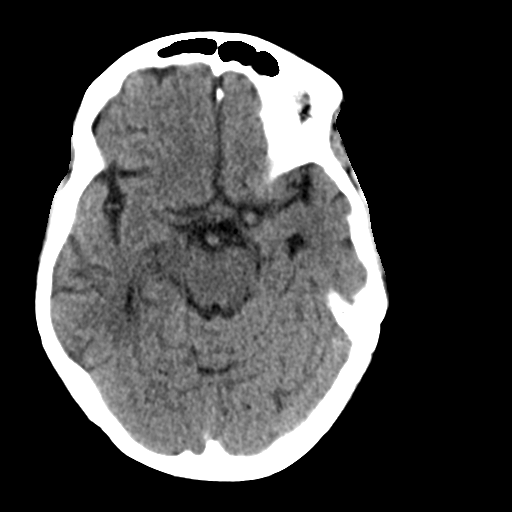
[im 14/30  brain]
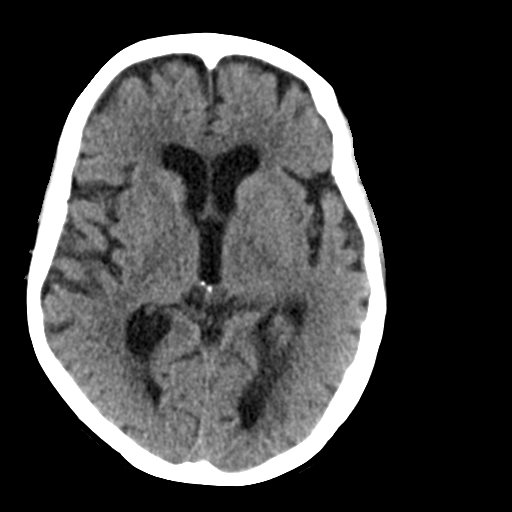
[im 14/30  bone]
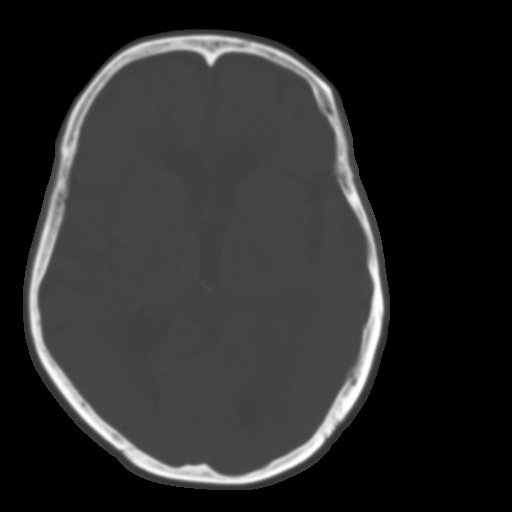
[im 17/30  brain]
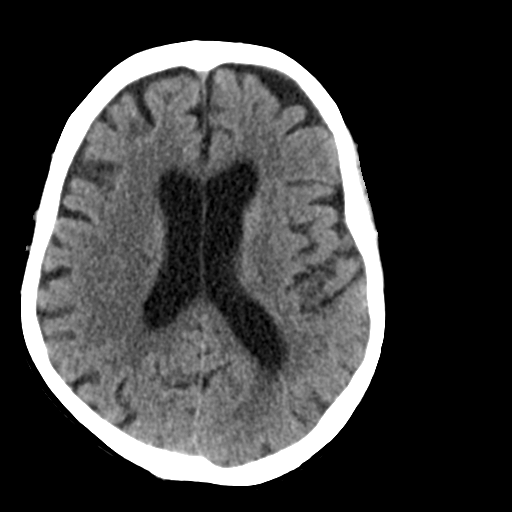
[im 20/30  brain]
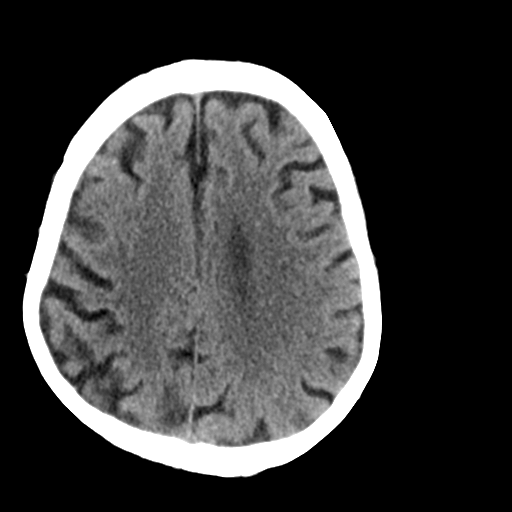
[im 23/30  brain]
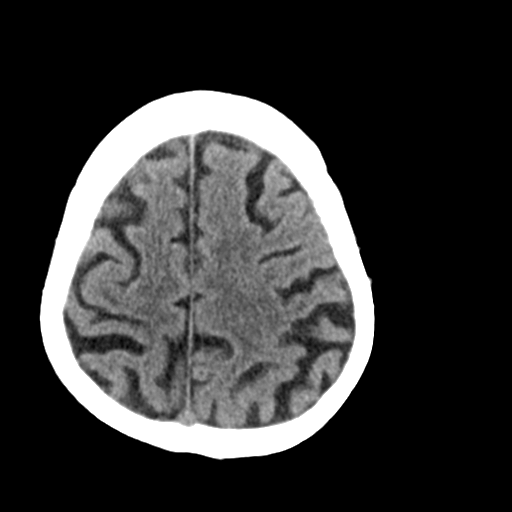
[im 25/30  brain]
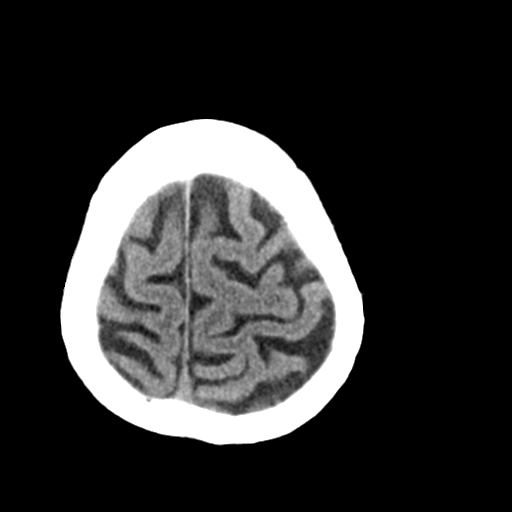
[im 25/30  bone]
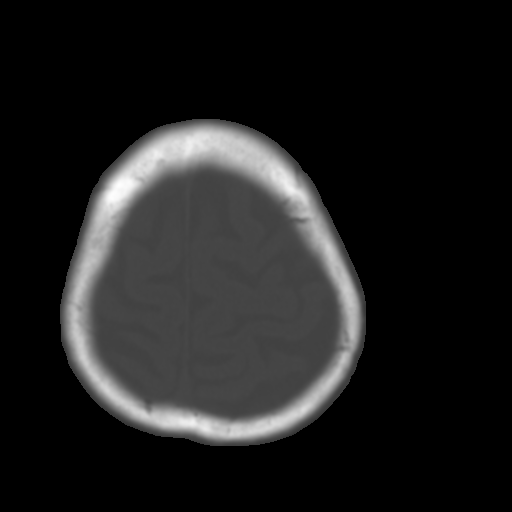
[im 28/30  brain]
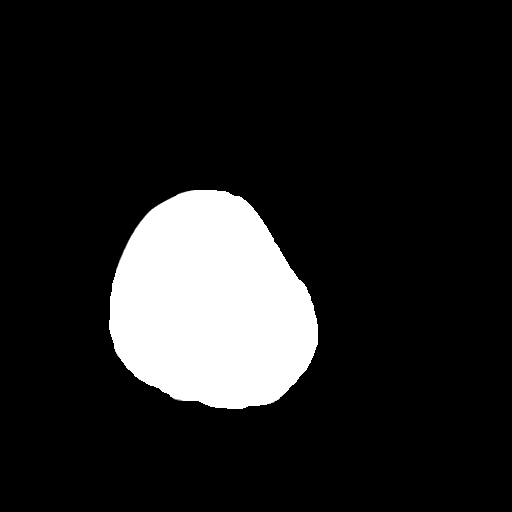

[Series 4: coronal soft tissue · coronal · 0.31mm/px · 3 of 64 slices shown]
[im 22/64  brain]
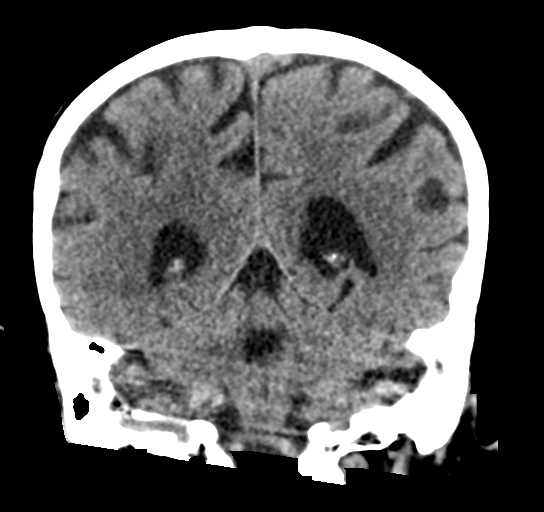
[im 29/64  brain]
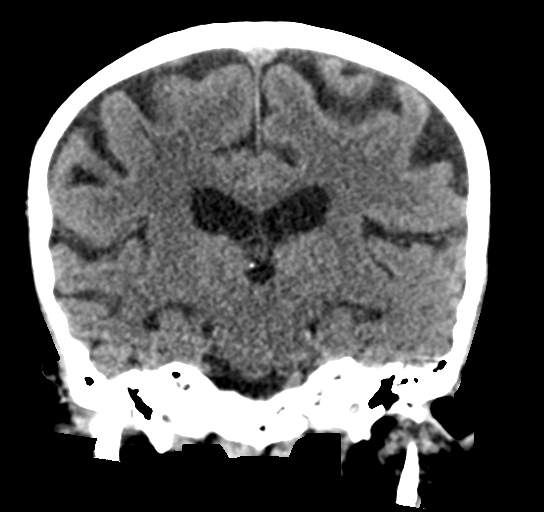
[im 36/64  brain]
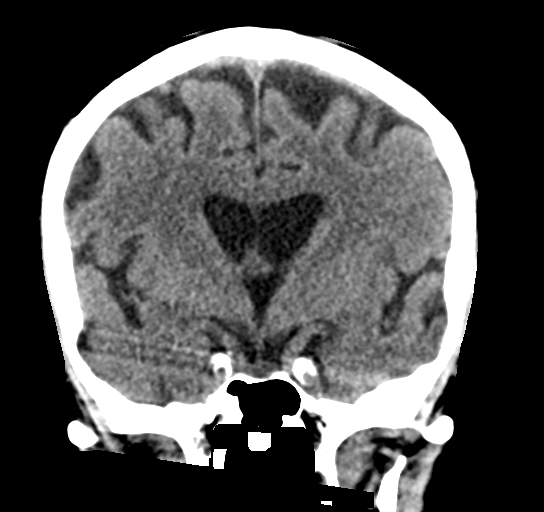

[Series 5: sagittal soft tissue · sagittal · 0.31mm/px · 3 of 58 slices shown]
[im 23/58  brain]
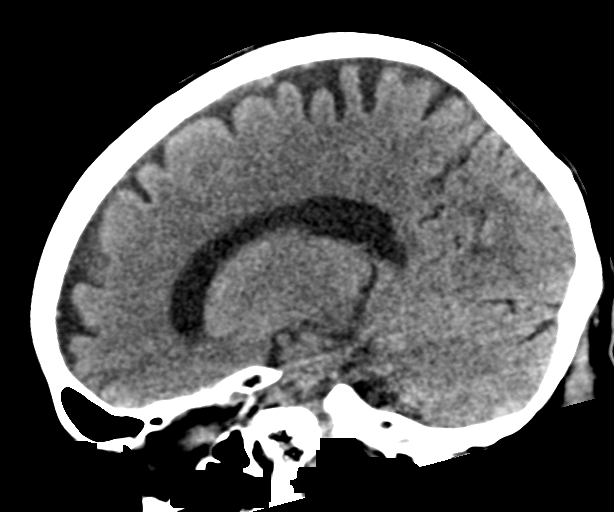
[im 29/58  brain]
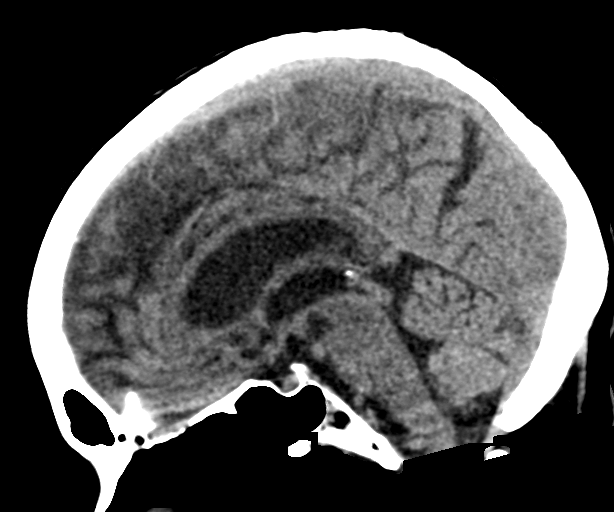
[im 35/58  brain]
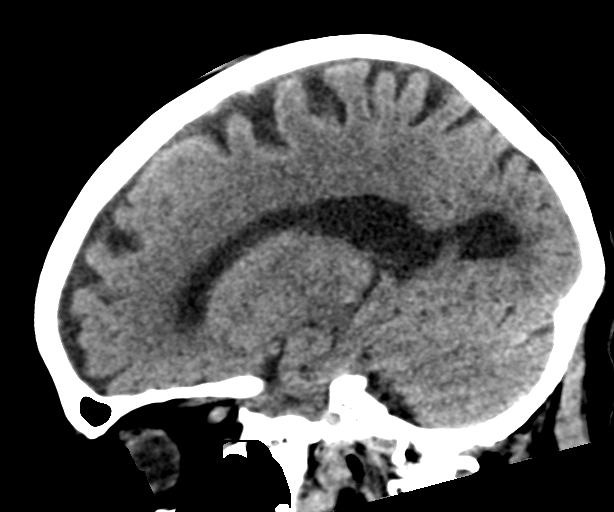

[16 of 47 positions shown; findings below may reference images not displayed]

FINDINGS: Brain: No evidence of acute infarction, hemorrhage, hydrocephalus,
extra-axial collection or mass lesion/mass effect. Generalized
atrophy and mild chronic small vessel ischemia.

Vascular: Atherosclerotic calcification.  No hyperdense vessel.

Skull: Negative

Sinuses/Orbits: Bilateral cataract resection.  No acute finding.
IMPRESSION: Senescent changes without new or acute finding when compared to

## 2018-10-02 ENCOUNTER — Ambulatory Visit (INDEPENDENT_AMBULATORY_CARE_PROVIDER_SITE_OTHER): Payer: Medicare Other | Admitting: Cardiovascular Disease

## 2018-10-02 ENCOUNTER — Other Ambulatory Visit: Payer: Self-pay

## 2018-10-02 ENCOUNTER — Encounter: Payer: Self-pay | Admitting: Cardiovascular Disease

## 2018-10-02 VITALS — BP 120/50 | HR 77 | Ht 65.0 in | Wt 127.0 lb

## 2018-10-02 DIAGNOSIS — I447 Left bundle-branch block, unspecified: Secondary | ICD-10-CM

## 2018-10-02 DIAGNOSIS — Z953 Presence of xenogenic heart valve: Secondary | ICD-10-CM | POA: Diagnosis not present

## 2018-10-02 MED ORDER — APIXABAN 2.5 MG PO TABS: 2.5000 mg | ORAL_TABLET | Freq: Two times a day (BID) | ORAL | 2 refills | Status: DC

## 2018-10-02 NOTE — Progress Notes (Signed)
Cardiology Office Note   Date:  10/02/2018   ID:  Kelsey Le, DOB [birth year], MRN [id number]  PCP:  Marguarite Arbour, MD  Cardiologist:   Lorine Bears, MD   Chief Complaint  Patient presents with  . other    6 month follow up. Meds reviewed by the pt. verbally. "doing well."       History of Present Illness: Kelsey Le is a 85 y.o. female who presents for a follow-up visit regarding aortic stenosis status post TAVR in February 2019.. Catheterization before TAVR showed no significant coronary artery disease. The patient has known history of recurrent GI bleeding due to AVMs and iron deficiency anemia. She has chronic back pain and has underwent kyphoplasty in the past.   The patient lives at [facility] assisted living.  She had multiple hospitalizations and emergency room visits since April.  She was hospitalized in April with DVT and pulmonary embolism and was started on anticoagulation.  In addition, she had recurrent UTIs which correlated with worsening memory and behavior disturbances.  This has improved with antibiotics.  She has been doing reasonably well and denies any chest pain but does complain of exertional dyspnea and she is not as active as before.  Past Medical History:  Diagnosis Date  . Anxiety   . Asthma   . AVM (arteriovenous malformation)    a. small intestine with recurrent GI bleeding.   . B12 deficiency   . Chronic diastolic CHF (congestive heart failure) (HCC)   . Hip fracture (HCC)    a. in the setting of mechanical fall  . Iron deficiency   . Iron deficiency anemia    a. requiring periodic transfusions  . Psychosis (HCC)   . S/P TAVR (transcatheter aortic valve replacement)    a. 02/12/17: s/p TAVR w/ an Edwards Sapien 3 THV (size 23 mm, model # 9600TFX, serial # [id number])  . Severe aortic stenosis   . Syncope and collapse   . Thyroid disease     Past Surgical History:  Procedure Laterality Date  . APPENDECTOMY    . BACK SURGERY      back fusion  . CARDIAC CATHETERIZATION    . ESOPHAGOGASTRODUODENOSCOPY (EGD) WITH PROPOFOL N/A 11/15/2015   Procedure: ESOPHAGOGASTRODUODENOSCOPY (EGD) WITH PROPOFOL;  Surgeon: [name], MD;  Location: [facility] ENDOSCOPY;  Service: Endoscopy;  Laterality: N/A;  . ESOPHAGOGASTRODUODENOSCOPY (EGD) WITH PROPOFOL N/A 10/12/2016   Procedure: ESOPHAGOGASTRODUODENOSCOPY (EGD) WITH PROPOFOL;  Surgeon: [name], MD;  Location: [facility] ENDOSCOPY;  Service: Endoscopy;  Laterality: N/A;  . FEMUR SURGERY     broken with rod  . FRACTURE SURGERY    . GIVENS CAPSULE STUDY N/A 02/20/2016   Procedure: GIVENS CAPSULE STUDY;  Surgeon: Scot Jun, MD;  Location: [facility] ENDOSCOPY;  Service: Endoscopy;  Laterality: N/A;  . HIP FRACTURE SURGERY    . INTRAMEDULLARY (IM) NAIL INTERTROCHANTERIC Right 11/18/2015   Procedure: INTRAMEDULLARY (IM) NAIL INTERTROCHANTRIC;  Surgeon: Juanell Fairly, MD;  Location: [facility] ORS;  Service: Orthopedics;  Laterality: Right;  . KYPHOPLASTY N/A 08/19/2014   Procedure: KYPHOPLASTY;  Surgeon: Kennedy Bucker, MD;  Location: [facility] ORS;  Service: Orthopedics;  Laterality: N/A;  . PARTIAL HYSTERECTOMY    . RIGHT/LEFT HEART CATH AND CORONARY ANGIOGRAPHY N/A 10/05/2016   Procedure: RIGHT/LEFT HEART CATH AND CORONARY ANGIOGRAPHY;  Surgeon: Kathleene Hazel, MD;  [facility]: [facility] INVASIVE [facility] LAB;  Service: Cardiovascular;  Laterality: N/A;  . TEE WITHOUT CARDIOVERSION N/A 02/12/2017   Procedure: TRANSESOPHAGEAL ECHOCARDIOGRAM (TEE);  Surgeon: Kathleene HazelMcAlhany, Christopher D, MD;  [facility];  Service: Open Heart Surgery;  Laterality: N/A;  . TRANSCATHETER AORTIC VALVE REPLACEMENT, TRANSFEMORAL N/A 02/12/2017   Procedure: TRANSCATHETER AORTIC VALVE REPLACEMENT, TRANSFEMORAL using a 23mm Edwards Sapien 3 Aortic Valve;  Surgeon: Kathleene HazelMcAlhany, Christopher D, MD;  Location: [facility] OR;  Service: Open Heart Surgery;  Laterality: N/A;  . TUBAL LIGATION       Current Outpatient Medications  Medication Sig  Dispense Refill  . acetaminophen (TYLENOL) 325 MG tablet Take 650 mg by mouth every 6 (six) hours as needed for moderate pain or headache.     Marland Kitchen. apixaban (ELIQUIS) 5 MG TABS tablet Take 5 mg by mouth 2 (two) times a day.    . Calcium Carbonate-Vitamin D (CALCIUM-VITAMIN D) 600-125 MG-UNIT TABS Take by mouth daily.    . Cholecalciferol (VITAMIN D-1000 MAX ST) 25 MCG (1000 UT) tablet TAKE (1) TABLET BY MOUTH ONCE A DAY    . clindamycin (CLEOCIN) 150 MG capsule Take 150 mg by mouth 3 (three) times daily. As needed for dental procedures    . clobetasol cream (TEMOVATE) 0.05 % Apply 1 application topically See admin instructions. Apply small amount vaginally on Monday and Friday    . estradiol (ESTRACE) 0.1 MG/GM vaginal cream Place 1 Applicatorful vaginally See admin instructions. Apply 0.5 grams vaginally Tuesday and Thursday morning    . furosemide (LASIX) 20 MG tablet Take 1 tablet (20 mg total) by mouth daily. 5 tablet 0  . iron polysaccharides (NIFEREX) 150 MG capsule Take 150 mg by mouth daily.    Marland Kitchen. levothyroxine (SYNTHROID, LEVOTHROID) 50 MCG tablet Take 50 mcg by mouth daily before breakfast.     . loratadine (CLARITIN) 10 MG tablet Take 10 mg by mouth daily.     . memantine (NAMENDA) 10 MG tablet Take 10 mg by mouth 2 (two) times daily.   0  . nitrofurantoin (MACRODANTIN) 50 MG capsule Take 1 capsule (50 mg total) by mouth at bedtime. 30 capsule 2  . omeprazole (PRILOSEC) 20 MG capsule Take 1 capsule (20 mg total) by mouth daily. 90 capsule 3  . polyvinyl alcohol (LIQUIFILM TEARS) 1.4 % ophthalmic solution Place 1 drop into both eyes as needed for dry eyes.    Marland Kitchen. sertraline (ZOLOFT) 50 MG tablet Take 1 tablet (50 mg total) by mouth daily. 30 tablet 2  . vitamin B-12 (CYANOCOBALAMIN) 1000 MCG tablet Take 1,000 mcg by mouth daily.     No current facility-administered medications for this visit.     Allergies:   Aspirin and Penicillins    Social History:  The patient  reports that she has  never smoked. She has never used smokeless tobacco. She reports that she does not drink alcohol or use drugs.   Family History:  The patient's family history includes Breast cancer in her maternal aunt; Heart block in her mother; Heart disease in her sister; Heart failure in her father.    ROS:  Please see the history of present illness.   Otherwise, review of systems are positive for none.   All other systems are reviewed and negative.    PHYSICAL EXAM: VS:  BP (!) 120/50 (BP Location: Left Arm, Patient Position: Sitting, Cuff Size: Normal)   Pulse 77   Ht 5\' 5"  (1.651 m)   Wt 127 lb (57.6 kg)   BMI 21.13 kg/m  , BMI Body mass index is 21.13 kg/m. GEN: Well nourished, well developed, in no acute distress  HEENT: normal  Neck: no JVD, or masses.  No carotid bruits Cardiac: RRR;  rubs, or gallops,no edema .  3/ 6 systolic murmur in the aortic area Respiratory:  clear to auscultation bilaterally, normal work of breathing GI: soft, nontender, nondistended, + BS MS: no deformity or atrophy  Skin: warm and dry, no rash Neuro:  Strength and sensation are intact Psych: euthymic mood, full affect   EKG:  EKG is  ordered today. EKG showed normal sinus rhythm with  left bundle branch block.  Recent Labs: 04/18/2018: TSH 1.588 06/19/2018: ALT 16; BUN 38; Creatinine, Ser 0.91; Hemoglobin 10.2; Platelets 172; Potassium 3.4; Sodium 146    Lipid Panel No results found for: CHOL, TRIG, HDL, CHOLHDL, VLDL, LDLCALC, LDLDIRECT    Wt Readings from Last 3 Encounters:  10/02/18 127 lb (57.6 kg)  06/19/18 130 lb (59 kg)  04/18/18 135 lb (61.2 kg)        ASSESSMENT AND PLAN:   1.  Status post TAVR for severe aortic stenosis: She is doing reasonably well overall.  I suspect that worsening exertional dyspnea is likely due to physical deconditioning given multiple hospitalizations and emergency room visits over the last 6 months.  In addition, COVID has restricted her activities at the  assisted living facility.  No plans to recheck her aortic valve given that she is not going to be a candidate for any further intervention.  2.  Left bundle branch block: This developed post TAVR.  Currently she has no symptoms related to this.  3.  DVT/pulmonary embolism: Appears that this was diagnosed in April and it was not unprovoked.  She finished 6 months.  However, I think she benefits from long-term low-dose anticoagulation and considering her age and weight, I elected to decrease Eliquis to 2.5 mg twice daily.  She is at high risk for bleeding given recurrent GI bleeds due to AVMs.   Disposition: Follow-up with me in 6 months.  Signed,  Kathlyn Sacramento, MD  10/02/2018 4:00 PM    [place]

## 2018-10-02 NOTE — Patient Instructions (Signed)
Medication Instructions:  Your physician has recommended you make the following change in your medication:  1- DECREASE Eliquis to 2.5 mg by mouth two times a day.  If you need a refill on your cardiac medications before your next appointment, please call your pharmacy.   Lab work: NONE If you have labs (blood work) drawn today and your tests are completely normal, you will receive your results only by: Marland Kitchen MyChart Message (if you have MyChart) OR . A paper copy in the mail If you have any lab test that is abnormal or we need to change your treatment, we will call you to review the results.  Testing/Procedures: NONE  Follow-Up: At [facility], you and your health needs are our priority.  As part of our continuing mission to provide you with exceptional heart care, we have created designated Provider Care Teams.  These Care Teams include your primary Cardiologist (physician) and Advanced Practice Providers (APPs -  Physician Assistants and Nurse Practitioners) who all work together to provide you with the care you need, when you need it. You will need a follow up appointment in 6 months.  Please call our office 2 months in advance to schedule this appointment.  You may see Kathlyn Sacramento, MD or one of the following Advanced Practice Providers on your designated Care Team:   Murray Hodgkins, NP Christell Faith, PA-C . Marrianne Mood, PA-C

## 2018-12-09 ENCOUNTER — Other Ambulatory Visit: Payer: Self-pay

## 2018-12-09 ENCOUNTER — Emergency Department
Admission: EM | Admit: 2018-12-09 | Discharge: 2018-12-09 | Disposition: A | Discharge status: Other Acute Inpatient Hospital | Payer: Medicare Other | Attending: Emergency Medicine | Admitting: Emergency Medicine

## 2018-12-09 ENCOUNTER — Emergency Department: Payer: Medicare Other

## 2018-12-09 DIAGNOSIS — I5032 Chronic diastolic (congestive) heart failure: Secondary | ICD-10-CM | POA: Insufficient documentation

## 2018-12-09 DIAGNOSIS — Y9389 Activity, other specified: Secondary | ICD-10-CM | POA: Diagnosis not present

## 2018-12-09 DIAGNOSIS — F0390 Unspecified dementia without behavioral disturbance: Secondary | ICD-10-CM | POA: Diagnosis not present

## 2018-12-09 DIAGNOSIS — W19XXXA Unspecified fall, initial encounter: Secondary | ICD-10-CM

## 2018-12-09 DIAGNOSIS — Y999 Unspecified external cause status: Secondary | ICD-10-CM | POA: Insufficient documentation

## 2018-12-09 DIAGNOSIS — S42201A Unspecified fracture of upper end of right humerus, initial encounter for closed fracture: Secondary | ICD-10-CM | POA: Insufficient documentation

## 2018-12-09 DIAGNOSIS — J45909 Unspecified asthma, uncomplicated: Secondary | ICD-10-CM | POA: Diagnosis not present

## 2018-12-09 DIAGNOSIS — S4991XA Unspecified injury of right shoulder and upper arm, initial encounter: Secondary | ICD-10-CM | POA: Diagnosis present

## 2018-12-09 DIAGNOSIS — Y92193 Bedroom in other specified residential institution as the place of occurrence of the external cause: Secondary | ICD-10-CM | POA: Diagnosis not present

## 2018-12-09 DIAGNOSIS — U071 COVID-19: Secondary | ICD-10-CM | POA: Diagnosis not present

## 2018-12-09 DIAGNOSIS — S0990XA Unspecified injury of head, initial encounter: Secondary | ICD-10-CM | POA: Insufficient documentation

## 2018-12-09 DIAGNOSIS — W06XXXA Fall from bed, initial encounter: Secondary | ICD-10-CM | POA: Insufficient documentation

## 2018-12-09 NOTE — ED Notes (Signed)
This RN spoke with daughter about transportation for pt back to [facility]. Facility contacted to ensure this was ok.

## 2018-12-09 NOTE — ED Provider Notes (Signed)
CT head is negative, she is cleared for outpatient follow-up with orthopedics.   Earleen Newport, MD 12/09/18 2202

## 2018-12-09 NOTE — ED Notes (Signed)
This RN spoke with pt's daughter, Kelsey Le who is pt's legal guardian. Pt's daughter was updated on POC.

## 2018-12-09 NOTE — ED Triage Notes (Addendum)
Per ems pt fell off bed at SNF and injured right shoulder. Pt c/o pain in right shoulder. Pt reportedly covid positive.  Pt is aox4. Pt hard of hearing. Nad noted. Right shoulder is bruised and appears swollen.

## 2018-12-09 NOTE — ED Provider Notes (Signed)
[facility] Emergency Department Provider Note       Time seen: ----------------------------------------- 6:55 PM on 12/09/2018 -----------------------------------------   I have reviewed the triage vital signs and the nursing notes.  HISTORY   Chief Complaint Fall    HPI Kelsey Le is a 85 y.o. female with a history of anxiety, asthma, CHF, iron deficiency anemia, aortic stenosis, syncope who presents to the ED for an unwitnessed fall.  Patient fell off her bed at a assisted living facility.  She injured her right shoulder.  She is complaining of pain into the right shoulder.  She is reportedly Covid positive, very hard of hearing.  Unclear if she has other complaints.  Past Medical History:  Diagnosis Date  . Anxiety   . Asthma   . AVM (arteriovenous malformation)    a. small intestine with recurrent GI bleeding.   . B12 deficiency   . Chronic diastolic CHF (congestive heart failure) (HCC)   . Hip fracture (HCC)    a. in the setting of mechanical fall  . Iron deficiency   . Iron deficiency anemia    a. requiring periodic transfusions  . Psychosis (HCC)   . S/P TAVR (transcatheter aortic valve replacement)    a. 02/12/17: s/p TAVR w/ an Edwards Sapien 3 THV (size 23 mm, model # 9600TFX, serial # [id number])  . Severe aortic stenosis   . Syncope and collapse   . Thyroid disease     Patient Active Problem List   Diagnosis Date Noted  . Altered mental state 06/19/2018  . Chronic rhinitis 03/31/2017  . Vitamin B12 deficiency 03/31/2017  . Vitamin D deficiency 03/31/2017  . Left bundle branch block 03/28/2017  . Prolonged QT interval 03/28/2017  . Dementia with psychosis (HCC) 03/19/2017  . Pericardial effusion 02/15/2017  . S/P TAVR (transcatheter aortic valve replacement) 02/12/2017  . Acute on chronic diastolic heart failure (HCC) 02/12/2017  . Arteriovenous malformation (AVM) 02/12/2017  . Anemia 10/11/2016  . Iron deficiency anemia  01/15/2016  . Severe aortic stenosis 11/16/2015  . Lichen sclerosus 10/31/2015  . Lumbar radiculitis 12/16/2013  . Osteoarthritis of hip 07/06/2013  . Trochanteric bursitis 07/06/2013    Past Surgical History:  Procedure Laterality Date  . APPENDECTOMY    . BACK SURGERY     back fusion  . CARDIAC CATHETERIZATION    . ESOPHAGOGASTRODUODENOSCOPY (EGD) WITH PROPOFOL N/A 11/15/2015   Procedure: ESOPHAGOGASTRODUODENOSCOPY (EGD) WITH PROPOFOL;  Surgeon: [name], MD;  Location: [facility] ENDOSCOPY;  Service: Endoscopy;  Laterality: N/A;  . ESOPHAGOGASTRODUODENOSCOPY (EGD) WITH PROPOFOL N/A 10/12/2016   Procedure: ESOPHAGOGASTRODUODENOSCOPY (EGD) WITH PROPOFOL;  Surgeon: [name], MD;  Location: [facility] ENDOSCOPY;  Service: Endoscopy;  Laterality: N/A;  . FEMUR SURGERY     broken with rod  . FRACTURE SURGERY    . GIVENS CAPSULE STUDY N/A 02/20/2016   Procedure: GIVENS CAPSULE STUDY;  Surgeon: Scot Jun, MD;  Location: [facility] ENDOSCOPY;  Service: Endoscopy;  Laterality: N/A;  . HIP FRACTURE SURGERY    . INTRAMEDULLARY (IM) NAIL INTERTROCHANTERIC Right 11/18/2015   Procedure: INTRAMEDULLARY (IM) NAIL INTERTROCHANTRIC;  Surgeon: Juanell Fairly, MD;  Location: [facility] ORS;  Service: Orthopedics;  Laterality: Right;  . KYPHOPLASTY N/A 08/19/2014   Procedure: KYPHOPLASTY;  Surgeon: Kennedy Bucker, MD;  Location: [facility] ORS;  Service: Orthopedics;  Laterality: N/A;  . PARTIAL HYSTERECTOMY    . RIGHT/LEFT HEART CATH AND CORONARY ANGIOGRAPHY N/A 10/05/2016   Procedure: RIGHT/LEFT HEART CATH AND CORONARY ANGIOGRAPHY;  Surgeon: Verne Carrow  D, MD;  Location: [place] CV LAB;  Service: Cardiovascular;  Laterality: N/A;  . TEE WITHOUT CARDIOVERSION N/A 02/12/2017   Procedure: TRANSESOPHAGEAL ECHOCARDIOGRAM (TEE);  Surgeon: Burnell Blanks, MD;  Location: [place];  Service: Open Heart Surgery;  Laterality: N/A;  . TRANSCATHETER AORTIC VALVE REPLACEMENT, TRANSFEMORAL N/A 02/12/2017    Procedure: TRANSCATHETER AORTIC VALVE REPLACEMENT, TRANSFEMORAL using a 70mm Edwards Sapien 3 Aortic Valve;  Surgeon: Burnell Blanks, MD;  Location: [place];  Service: Open Heart Surgery;  Laterality: N/A;  . TUBAL LIGATION      Allergies Aspirin and Penicillins  Social History Social History   Tobacco Use  . Smoking status: Never Smoker  . Smokeless tobacco: Never Used  Substance Use Topics  . Alcohol use: No  . Drug use: No    Review of Systems Unknown, positive for right shoulder pain, recent Covid positivity  All systems negative/normal/unremarkable except as stated in the HPI  ____________________________________________   PHYSICAL EXAM:  VITAL SIGNS: ED Triage Vitals  Enc Vitals Group     BP      Pulse      Resp      Temp      Temp src      SpO2      Weight      Height      Head Circumference      Peak Flow      Pain Score      Pain Loc      Pain Edu?      Excl. in [place]?     Constitutional: Alert, mild distress Eyes: Conjunctivae are normal. Normal extraocular movements. Cardiovascular: Normal rate, regular rhythm. No murmurs, rubs, or gallops. Respiratory: Normal respiratory effort without tachypnea nor retractions. Breath sounds are clear and equal bilaterally. No wheezes/rales/rhonchi. Gastrointestinal: Soft and nontender. Normal bowel sounds Musculoskeletal: Right shoulder swelling and ecchymosis is noted.  Severe pain with range of motion of  the right shoulder Neurologic:  Normal speech and language. No gross focal neurologic deficits are appreciated.  Skin:  Skin is warm, dry and intact. No rash noted. Psychiatric: Mood and affect are normal. Speech and behavior are normal.  ____________________________________________  EKG: Interpreted by me.  Sinus rhythm with baseline artifact, rate is 87 bpm, left bundle branch block, long QT  ____________________________________________  ED COURSE:  As part of my medical decision making, I  reviewed the following data within the [place] History obtained from family if available, nursing notes, old chart and ekg, as well as notes from prior ED visits. Patient presented for a fall with right shoulder injury, we will assess with labs and imaging as indicated at this time.   Procedures  [place] PAONE was evaluated in Emergency Department on 12/09/2018 for the symptoms described in the history of present illness. She was evaluated in the context of the global COVID-19 pandemic, which necessitated consideration that the patient might be at risk for infection with the SARS-CoV-2 virus that causes COVID-19. Institutional protocols and algorithms that pertain to the evaluation of patients at risk for COVID-19 are in a state of rapid change based on information released by regulatory bodies including the CDC and federal and state organizations. These policies and algorithms were followed during the patient's care in the ED.   RADIOLOGY Images were viewed by me  Right shoulder x-ray, chest x-ray, pelvis x-ray IMPRESSION: Comminuted fracture of the surgical neck of the right humerus with extension to the humeral head.  No glenohumeral dislocation. IMPRESSION: 1. Right midlung airspace disease may indicate developing consolidation, including the possibility of pneumonia. 2. Small left pleural effusion with basilar atelectasis. 3. Aortic atherosclerosis. ____________________________________________   DIFFERENTIAL DIAGNOSIS   Fall, contusion, fracture, dislocation, pneumonia  FINAL ASSESSMENT AND PLAN  Fall, proximal right humerus fracture, COVID-19   Plan: The patient had presented for a fall with resulting right shoulder injury. Patient's imaging revealed a comminuted fracture of the surgical neck of the right humerus for which she was placed in a shoulder immobilizer.  She does not have any respiratory symptoms right now but is COVID-19 positive.  X-ray was  reflective of airspace disease which is expected.  Overall she is cleared for outpatient orthopedics follow-up.   Johnathan E WUlice Dashilliams, MD    Note: This note was generated in part or whole with voice recognition software. Voice recognition is usually quite accurate but there are transcription errors that can and very often do occur. I apologize for any typographical errors that were not detected and corrected.     Emily FilbertWilliams, Jonathan E, MD 12/09/18 2016

## 2018-12-19 ENCOUNTER — Emergency Department: Payer: Medicare Other

## 2018-12-19 ENCOUNTER — Encounter: Payer: Self-pay | Admitting: Emergency Medicine

## 2018-12-19 ENCOUNTER — Other Ambulatory Visit: Payer: Self-pay

## 2018-12-19 ENCOUNTER — Inpatient Hospital Stay
Admission: EM | Admit: 2018-12-19 | Discharge: 2018-12-25 | DRG: 064 | Disposition: A | Discharge status: Hospice | Payer: Medicare Other | Attending: Internal Medicine | Admitting: Internal Medicine

## 2018-12-19 DIAGNOSIS — E039 Hypothyroidism, unspecified: Secondary | ICD-10-CM | POA: Diagnosis present

## 2018-12-19 DIAGNOSIS — J45909 Unspecified asthma, uncomplicated: Secondary | ICD-10-CM | POA: Diagnosis present

## 2018-12-19 DIAGNOSIS — S42301D Unspecified fracture of shaft of humerus, right arm, subsequent encounter for fracture with routine healing: Secondary | ICD-10-CM

## 2018-12-19 DIAGNOSIS — Z7901 Long term (current) use of anticoagulants: Secondary | ICD-10-CM

## 2018-12-19 DIAGNOSIS — I447 Left bundle-branch block, unspecified: Secondary | ICD-10-CM | POA: Diagnosis present

## 2018-12-19 DIAGNOSIS — E860 Dehydration: Secondary | ICD-10-CM | POA: Diagnosis present

## 2018-12-19 DIAGNOSIS — Z952 Presence of prosthetic heart valve: Secondary | ICD-10-CM

## 2018-12-19 DIAGNOSIS — Z981 Arthrodesis status: Secondary | ICD-10-CM | POA: Diagnosis not present

## 2018-12-19 DIAGNOSIS — G9341 Metabolic encephalopathy: Secondary | ICD-10-CM | POA: Diagnosis present

## 2018-12-19 DIAGNOSIS — D500 Iron deficiency anemia secondary to blood loss (chronic): Secondary | ICD-10-CM | POA: Diagnosis not present

## 2018-12-19 DIAGNOSIS — F0391 Unspecified dementia with behavioral disturbance: Secondary | ICD-10-CM | POA: Diagnosis not present

## 2018-12-19 DIAGNOSIS — N390 Urinary tract infection, site not specified: Secondary | ICD-10-CM | POA: Diagnosis present

## 2018-12-19 DIAGNOSIS — N179 Acute kidney failure, unspecified: Secondary | ICD-10-CM | POA: Diagnosis present

## 2018-12-19 DIAGNOSIS — Z7902 Long term (current) use of antithrombotics/antiplatelets: Secondary | ICD-10-CM | POA: Diagnosis not present

## 2018-12-19 DIAGNOSIS — S42301A Unspecified fracture of shaft of humerus, right arm, initial encounter for closed fracture: Secondary | ICD-10-CM | POA: Diagnosis present

## 2018-12-19 DIAGNOSIS — Z803 Family history of malignant neoplasm of breast: Secondary | ICD-10-CM | POA: Diagnosis not present

## 2018-12-19 DIAGNOSIS — Z86711 Personal history of pulmonary embolism: Secondary | ICD-10-CM

## 2018-12-19 DIAGNOSIS — Z515 Encounter for palliative care: Secondary | ICD-10-CM | POA: Diagnosis not present

## 2018-12-19 DIAGNOSIS — W19XXXD Unspecified fall, subsequent encounter: Secondary | ICD-10-CM | POA: Diagnosis present

## 2018-12-19 DIAGNOSIS — Z66 Do not resuscitate: Secondary | ICD-10-CM | POA: Diagnosis present

## 2018-12-19 DIAGNOSIS — Z8619 Personal history of other infectious and parasitic diseases: Secondary | ICD-10-CM | POA: Diagnosis not present

## 2018-12-19 DIAGNOSIS — I639 Cerebral infarction, unspecified: Secondary | ICD-10-CM | POA: Diagnosis not present

## 2018-12-19 DIAGNOSIS — F419 Anxiety disorder, unspecified: Secondary | ICD-10-CM | POA: Diagnosis present

## 2018-12-19 DIAGNOSIS — I5032 Chronic diastolic (congestive) heart failure: Secondary | ICD-10-CM | POA: Diagnosis present

## 2018-12-19 DIAGNOSIS — U071 COVID-19: Secondary | ICD-10-CM | POA: Diagnosis not present

## 2018-12-19 DIAGNOSIS — R4182 Altered mental status, unspecified: Secondary | ICD-10-CM | POA: Diagnosis present

## 2018-12-19 DIAGNOSIS — I6340 Cerebral infarction due to embolism of unspecified cerebral artery: Principal | ICD-10-CM | POA: Diagnosis present

## 2018-12-19 DIAGNOSIS — Z8249 Family history of ischemic heart disease and other diseases of the circulatory system: Secondary | ICD-10-CM | POA: Diagnosis not present

## 2018-12-19 DIAGNOSIS — F0150 Vascular dementia without behavioral disturbance: Secondary | ICD-10-CM | POA: Diagnosis present

## 2018-12-19 DIAGNOSIS — I6389 Other cerebral infarction: Secondary | ICD-10-CM | POA: Diagnosis not present

## 2018-12-19 DIAGNOSIS — E870 Hyperosmolality and hypernatremia: Secondary | ICD-10-CM | POA: Diagnosis not present

## 2018-12-19 DIAGNOSIS — Z953 Presence of xenogenic heart valve: Secondary | ICD-10-CM | POA: Diagnosis not present

## 2018-12-19 DIAGNOSIS — I4891 Unspecified atrial fibrillation: Secondary | ICD-10-CM | POA: Diagnosis present

## 2018-12-19 DIAGNOSIS — K5520 Angiodysplasia of colon without hemorrhage: Secondary | ICD-10-CM | POA: Diagnosis present

## 2018-12-19 DIAGNOSIS — Z7189 Other specified counseling: Secondary | ICD-10-CM | POA: Diagnosis not present

## 2018-12-19 DIAGNOSIS — D509 Iron deficiency anemia, unspecified: Secondary | ICD-10-CM | POA: Diagnosis present

## 2018-12-19 DIAGNOSIS — F0392 Unspecified dementia, unspecified severity, with psychotic disturbance: Secondary | ICD-10-CM | POA: Diagnosis present

## 2018-12-19 LAB — COMPREHENSIVE METABOLIC PANEL
ALT: 17 U/L (ref 0–44)
AST: 28 U/L (ref 15–41)
Albumin: 3.8 g/dL (ref 3.5–5.0)
Alkaline Phosphatase: 82 U/L (ref 38–126)
Anion gap: 15 (ref 5–15)
BUN: UNDETERMINED mg/dL (ref 8–23)
CO2: 18 mmol/L — ABNORMAL LOW (ref 22–32)
Calcium: 9.4 mg/dL (ref 8.9–10.3)
Chloride: 109 mmol/L (ref 98–111)
Creatinine, Ser: UNDETERMINED mg/dL (ref 0.44–1.00)
Glucose, Bld: 88 mg/dL (ref 70–99)
Potassium: 4.4 mmol/L (ref 3.5–5.1)
Sodium: 142 mmol/L (ref 135–145)
Total Bilirubin: 1.2 mg/dL (ref 0.3–1.2)
Total Protein: 7.5 g/dL (ref 6.5–8.1)

## 2018-12-19 LAB — BUN: BUN: 57 mg/dL — ABNORMAL HIGH (ref 8–23)

## 2018-12-19 LAB — URINALYSIS, COMPLETE (UACMP) WITH MICROSCOPIC
Bacteria, UA: NONE SEEN
Bilirubin Urine: NEGATIVE
Glucose, UA: NEGATIVE mg/dL
Hgb urine dipstick: NEGATIVE
Ketones, ur: 5 mg/dL — AB
Leukocytes,Ua: NEGATIVE
Nitrite: NEGATIVE
Protein, ur: NEGATIVE mg/dL
Specific Gravity, Urine: 1.017 (ref 1.005–1.030)
pH: 5 (ref 5.0–8.0)

## 2018-12-19 LAB — CBC
HCT: 28.5 % — ABNORMAL LOW (ref 36.0–46.0)
Hemoglobin: 8.9 g/dL — ABNORMAL LOW (ref 12.0–15.0)
MCH: 25.7 pg — ABNORMAL LOW (ref 26.0–34.0)
MCHC: 31.2 g/dL (ref 30.0–36.0)
MCV: 82.4 fL (ref 80.0–100.0)
Platelets: 300 10*3/uL (ref 150–400)
RBC: 3.46 MIL/uL — ABNORMAL LOW (ref 3.87–5.11)
RDW: 21.6 % — ABNORMAL HIGH (ref 11.5–15.5)
WBC: 8.7 10*3/uL (ref 4.0–10.5)
nRBC: 0 % (ref 0.0–0.2)

## 2018-12-19 LAB — CREATININE, SERUM
Creatinine, Ser: 1.32 mg/dL — ABNORMAL HIGH (ref 0.44–1.00)
GFR calc Af Amer: 42 mL/min — ABNORMAL LOW (ref >60)
GFR calc non Af Amer: 36 mL/min — ABNORMAL LOW (ref >60)

## 2018-12-19 MED ORDER — SODIUM CHLORIDE 0.9 % IV SOLN: Freq: Once | INTRAVENOUS | Status: DC

## 2018-12-19 MED ORDER — ACETAMINOPHEN 325 MG PO TABS: 650.0000 mg | ORAL_TABLET | Freq: Four times a day (QID) | ORAL | Status: DC | PRN

## 2018-12-19 MED ORDER — ACETAMINOPHEN 325 MG PO TABS
ORAL_TABLET | ORAL | Status: AC
  Filled 2018-12-19: qty 2

## 2018-12-19 MED ORDER — SODIUM CHLORIDE 0.9 % IV SOLN: Freq: Once | INTRAVENOUS | Status: AC

## 2018-12-19 MED ORDER — SODIUM CHLORIDE 0.9 % IV SOLN: INTRAVENOUS | Status: DC

## 2018-12-19 MED ORDER — ACETAMINOPHEN 650 MG RE SUPP: 650.0000 mg | Freq: Four times a day (QID) | RECTAL | Status: DC | PRN

## 2018-12-19 MED ORDER — APIXABAN 2.5 MG PO TABS
2.5000 mg | ORAL_TABLET | Freq: Two times a day (BID) | ORAL | Status: DC
  Filled 2018-12-19: qty 1

## 2018-12-19 NOTE — ED Notes (Signed)
Admitting Provider at bedside. 

## 2018-12-19 NOTE — H&P (Signed)
History and Physical   Kelsey Le JXB:[id number] DOB: 1930/03/02 DOA: 12/19/2018  Referring MD/NP/PA: Dr. Derrill Kay  PCP: Marguarite Arbour, MD   Outpatient Specialists: None  Patient coming from: Assisted living facility  Chief Complaint: Altered mental status  HPI: Kelsey Le is a 85 y.o. female with medical history significant of atrial fibrillation, diastolic dysfunction CHF, dementia, recurrent AVMs, asthma, iron deficiency anemia, who has had recent falls at the facility including right humeral shaft fracture currently in immobilizer.  Daughter visited today and found patient to have had worsening mental status.  She was able to talk to the mom yesterday but today she was unable to do so.  Patient apparently has recurrent UTIs.  They thought she had a UTI and started her on Bactrim and patient continues to be more confused.  She is unable to give any history.  Work-up in the ER so far showed no evidence of infection including UTI.  Significant finding is evidence of acute kidney injury and dehydration.  Patient is being admitted to the hospital with metabolic encephalopathy probably due to poor oral intake with acute kidney injury.  ED Course: Temperature 98.1 blood pressure 161/64 pulse 85 respiratory rate of 14 oxygen sat 97% on room air.  White count is 8.7 hemoglobin 8.9 and platelets 300.  Sodium 142 potassium 4.4 chloride 109 CO2 of 18 BUN 57 creatinine 1.32 and calcium 9.4 glucose 88.  Urinalysis negative chest x-ray showed no acute findings a head CT without contrast showed no acute findings.  Patient initiated on IV fluids and being admitted for treatment  Review of Systems: As per HPI otherwise 10 point review of systems negative.    Past Medical History:  Diagnosis Date  . Anxiety   . Asthma   . AVM (arteriovenous malformation)    a. small intestine with recurrent GI bleeding.   . B12 deficiency   . Chronic diastolic CHF (congestive heart failure) (HCC)   . Hip  fracture (HCC)    a. in the setting of mechanical fall  . Iron deficiency   . Iron deficiency anemia    a. requiring periodic transfusions  . Psychosis (HCC)   . S/P TAVR (transcatheter aortic valve replacement)    a. 02/12/17: s/p TAVR w/ an Edwards Sapien 3 THV (size 23 mm, model # 9600TFX, serial # [id number])  . Severe aortic stenosis   . Syncope and collapse   . Thyroid disease     Past Surgical History:  Procedure Laterality Date  . APPENDECTOMY    . BACK SURGERY     back fusion  . CARDIAC CATHETERIZATION    . ESOPHAGOGASTRODUODENOSCOPY (EGD) WITH PROPOFOL N/A 11/15/2015   Procedure: ESOPHAGOGASTRODUODENOSCOPY (EGD) WITH PROPOFOL;  Surgeon: [name], MD;  Location: [facility] ENDOSCOPY;  Service: Endoscopy;  Laterality: N/A;  . ESOPHAGOGASTRODUODENOSCOPY (EGD) WITH PROPOFOL N/A 10/12/2016   Procedure: ESOPHAGOGASTRODUODENOSCOPY (EGD) WITH PROPOFOL;  Surgeon: [name], MD;  Location: [facility] ENDOSCOPY;  Service: Endoscopy;  Laterality: N/A;  . FEMUR SURGERY     broken with rod  . FRACTURE SURGERY    . GIVENS CAPSULE STUDY N/A 02/20/2016   Procedure: GIVENS CAPSULE STUDY;  Surgeon: Scot Jun, MD;  Location: [facility] ENDOSCOPY;  Service: Endoscopy;  Laterality: N/A;  . HIP FRACTURE SURGERY    . INTRAMEDULLARY (IM) NAIL INTERTROCHANTERIC Right 11/18/2015   Procedure: INTRAMEDULLARY (IM) NAIL INTERTROCHANTRIC;  Surgeon: Juanell Fairly, MD;  Location: [facility] ORS;  Service: Orthopedics;  Laterality: Right;  . KYPHOPLASTY N/A 08/19/2014  Procedure: KYPHOPLASTY;  Surgeon: Kennedy Bucker, MD;  Location: [facility] ORS;  Service: Orthopedics;  Laterality: N/A;  . PARTIAL HYSTERECTOMY    . RIGHT/LEFT HEART CATH AND CORONARY ANGIOGRAPHY N/A 10/05/2016   Procedure: RIGHT/LEFT HEART CATH AND CORONARY ANGIOGRAPHY;  Surgeon: Kathleene Hazel, MD;  Location: [facility] INVASIVE CV LAB;  Service: Cardiovascular;  Laterality: N/A;  . TEE WITHOUT CARDIOVERSION N/A 02/12/2017   Procedure: TRANSESOPHAGEAL  ECHOCARDIOGRAM (TEE);  Surgeon: Kathleene Hazel, MD;  Location: [facility] OR;  Service: Open Heart Surgery;  Laterality: N/A;  . TRANSCATHETER AORTIC VALVE REPLACEMENT, TRANSFEMORAL N/A 02/12/2017   Procedure: TRANSCATHETER AORTIC VALVE REPLACEMENT, TRANSFEMORAL using a 23mm Edwards Sapien 3 Aortic Valve;  Surgeon: Kathleene Hazel, MD;  Location: [facility] OR;  Service: Open Heart Surgery;  Laterality: N/A;  . TUBAL LIGATION       reports that she has never smoked. She has never used smokeless tobacco. She reports that she does not drink alcohol or use drugs.  Allergies  Allergen Reactions  . Aspirin Other (See Comments)    GI bleeding   . Penicillins Rash and Other (See Comments)    PATIENT HAS HAD A PCN REACTION WITH IMMEDIATE RASH, FACIAL/TONGUE/THROAT SWELLING, SOB, OR LIGHTHEADEDNESS WITH HYPOTENSION:  #  #  #  YES  #  #  #   Has patient had a PCN reaction causing severe rash involving mucus membranes or skin necrosis: No Has patient had a PCN reaction that required hospitalization: No Has patient had a PCN reaction occurring within the last 10 years: No     Family History  Problem Relation Age of Onset  . Heart block Mother   . Heart failure Father   . Heart disease Sister   . Breast cancer Maternal Aunt      Prior to Admission medications   Medication Sig Start Date End Date Taking? Authorizing Provider  acetaminophen (TYLENOL) 325 MG tablet Take 650 mg by mouth every 6 (six) hours as needed for moderate pain or headache.     [provider]  apixaban (ELIQUIS) 2.5 MG TABS tablet Take 1 tablet (2.5 mg total) by mouth 2 (two) times daily. 10/02/18   Iran Ouch, MD  Calcium Carbonate-Vitamin D (CALCIUM-VITAMIN D) 600-125 MG-UNIT TABS Take by mouth daily.    [provider]  Cholecalciferol (VITAMIN D-1000 MAX ST) 25 MCG (1000 UT) tablet TAKE (1) TABLET BY MOUTH ONCE A DAY 07/17/18   [provider]  clindamycin (CLEOCIN) 150 MG capsule Take 150  mg by mouth 3 (three) times daily. As needed for dental procedures 09/25/18   [provider]  clobetasol cream (TEMOVATE) 0.05 % Apply 1 application topically See admin instructions. Apply small amount vaginally on Monday and Friday    [provider]  estradiol (ESTRACE) 0.1 MG/GM vaginal cream Place 1 Applicatorful vaginally See admin instructions. Apply 0.5 grams vaginally Tuesday and Thursday morning    [provider]  furosemide (LASIX) 20 MG tablet Take 1 tablet (20 mg total) by mouth daily. 02/21/17 10/02/18  Janetta Hora, PA-C  iron polysaccharides (NIFEREX) 150 MG capsule Take 150 mg by mouth daily.    [provider]  levothyroxine (SYNTHROID, LEVOTHROID) 50 MCG tablet Take 50 mcg by mouth daily before breakfast.     [provider]  loratadine (CLARITIN) 10 MG tablet Take 10 mg by mouth daily.  02/26/17 10/02/18  [provider]  memantine (NAMENDA) 10 MG tablet Take 10 mg by mouth 2 (two) times  daily.  07/11/17   [provider]  nitrofurantoin (MACRODANTIN) 50 MG capsule Take 1 capsule (50 mg total) by mouth at bedtime. 06/19/18   Mariel Craft, MD  omeprazole (PRILOSEC) 20 MG capsule Take 1 capsule (20 mg total) by mouth daily. 02/21/17   [name], MD  polyvinyl alcohol (LIQUIFILM TEARS) 1.4 % ophthalmic solution Place 1 drop into both eyes as needed for dry eyes.    [provider]  sertraline (ZOLOFT) 50 MG tablet Take 1 tablet (50 mg total) by mouth daily. 06/19/18   Mariel Craft, MD  vitamin B-12 (CYANOCOBALAMIN) 1000 MCG tablet Take 1,000 mcg by mouth daily.    [provider]    Physical Exam: Vitals:   12/19/18 1311 12/19/18 1312 12/19/18 1547  BP: 130/63  (!) 161/64  Pulse: 85  82  Resp: 14  18  Temp: 98.1 F (36.7 C)    TempSrc: Oral    SpO2: 97%  98%  Weight:  56.7 kg   Height:  5\' 3"  (1.6 m)       Constitutional: NAD, confused, chronically ill looking Vitals:   12/19/18  1311 12/19/18 1312 12/19/18 1547  BP: 130/63  (!) 161/64  Pulse: 85  82  Resp: 14  18  Temp: 98.1 F (36.7 C)    TempSrc: Oral    SpO2: 97%  98%  Weight:  56.7 kg   Height:  5\' 3"  (1.6 m)    Eyes: PERRL, lids and conjunctivae normal ENMT: Mucous membranes are dry posterior pharynx clear of any exudate or lesions.Normal dentition.  Neck: normal, supple, no masses, no thyromegaly Respiratory: clear to auscultation bilaterally, no wheezing, no crackles. Normal respiratory effort. No accessory muscle use.  Cardiovascular: Irregularly irregular rate and rhythm, no murmurs / rubs / gallops. No extremity edema. 2+ pedal pulses. No carotid bruits.  Abdomen: no tenderness, no masses palpated. No hepatosplenomegaly. Bowel sounds positive.  Musculoskeletal: no clubbing / cyanosis. No joint deformity upper and lower extremities.  Right humeral fracture in the sling, good ROM, no contractures. Normal muscle tone.  Skin: no rashes, lesions, ulcers. No induration Neurologic: CN 2-12 grossly intact. Sensation intact, DTR normal. Strength 5/5 in all 4.  Psychiatric: Confused and disoriented in person place and time    Labs on Admission: I have personally reviewed following labs and imaging studies  CBC: Recent Labs  Lab 12/19/18 1316  WBC 8.7  HGB 8.9*  HCT 28.5*  MCV 82.4  PLT 300   Basic Metabolic Panel: Recent Labs  Lab 12/19/18 1316 12/19/18 1750  NA 142  --   K 4.4  --   CL 109  --   CO2 18*  --   GLUCOSE 88  --   BUN QUANTITY NOT SUFFICIENT, UNABLE TO PERFORM TEST 57*  CREATININE QUANTITY NOT SUFFICIENT, UNABLE TO PERFORM TEST 1.32*  CALCIUM 9.4  --    GFR: Estimated Creatinine Clearance: 25.3 mL/min (A) (by C-G formula based on SCr of 1.32 mg/dL (H)). Liver Function Tests: Recent Labs  Lab 12/19/18 1316  AST 28  ALT 17  ALKPHOS 82  BILITOT 1.2  PROT 7.5  ALBUMIN 3.8   No results for input(s): LIPASE, AMYLASE in the last 168 hours. No results for input(s): AMMONIA  in the last 168 hours. Coagulation Profile: No results for input(s): INR, PROTIME in the last 168 hours. Cardiac Enzymes: No results for input(s): CKTOTAL, CKMB, CKMBINDEX, TROPONINI in the last 168 hours. BNP (last 3 results) No results  for input(s): PROBNP in the last 8760 hours. HbA1C: No results for input(s): HGBA1C in the last 72 hours. CBG: No results for input(s): GLUCAP in the last 168 hours. Lipid Profile: No results for input(s): CHOL, HDL, LDLCALC, TRIG, CHOLHDL, LDLDIRECT in the last 72 hours. Thyroid Function Tests: No results for input(s): TSH, T4TOTAL, FREET4, T3FREE, THYROIDAB in the last 72 hours. Anemia Panel: No results for input(s): VITAMINB12, FOLATE, FERRITIN, TIBC, IRON, RETICCTPCT in the last 72 hours. Urine analysis:    Component Value Date/Time   COLORURINE YELLOW (A) 12/19/2018 1739   APPEARANCEUR HAZY (A) 12/19/2018 1739   APPEARANCEUR Clear 01/29/2013 1042   LABSPEC 1.017 12/19/2018 1739   LABSPEC 1.019 01/29/2013 1042   PHURINE 5.0 12/19/2018 1739   GLUCOSEU NEGATIVE 12/19/2018 1739   GLUCOSEU Negative 01/29/2013 1042   HGBUR NEGATIVE 12/19/2018 1739   BILIRUBINUR NEGATIVE 12/19/2018 1739   BILIRUBINUR Negative 01/29/2013 1042   KETONESUR 5 (A) 12/19/2018 1739   PROTEINUR NEGATIVE 12/19/2018 1739   NITRITE NEGATIVE 12/19/2018 1739   LEUKOCYTESUR NEGATIVE 12/19/2018 1739   LEUKOCYTESUR Trace 01/29/2013 1042   Sepsis Labs: @LABRCNTIP (procalcitonin:4,lacticidven:4) )No results found for this or any previous visit (from the past 240 hour(s)).   Radiological Exams on Admission: CT Head Wo Contrast  Result Date: 12/19/2018 CLINICAL DATA:  Altered mental status. EXAM: CT HEAD WITHOUT CONTRAST TECHNIQUE: Contiguous axial images were obtained from the base of the skull through the vertex without intravenous contrast. COMPARISON:  December 09, 2018. FINDINGS: Brain: Mild diffuse cortical atrophy is noted. Mild chronic ischemic white matter disease is  noted. No mass effect or midline shift is noted. Ventricular size is within normal limits. There is no evidence of mass lesion, hemorrhage or acute infarction. Vascular: No hyperdense vessel or unexpected calcification. Skull: Normal. Negative for fracture or focal lesion. Sinuses/Orbits: No acute finding. Other: None. IMPRESSION: Mild diffuse cortical atrophy. Mild chronic ischemic white matter disease. No acute intracranial abnormality seen. Electronically Signed   By: Lupita RaiderJames  Green Jr M.D.   On: 12/19/2018 17:03   DG Chest Port 1 View  Result Date: 12/19/2018 CLINICAL DATA:  Altered mental status. COVID. Recent RIGHT humeral fracture. EXAM: PORTABLE CHEST 1 VIEW COMPARISON:  12/09/2018 FINDINGS: The patient is rotated and leaning towards the RIGHT and this is a mildly low volume study. Mild RIGHT mid and lower lung opacities have slightly improved. Improved aeration in the LEFT lung base noted. No definite pleural effusion or pneumothorax. The cardiomediastinal silhouette is unchanged given technique. Aortic valve replacement again noted. Proximal RIGHT humeral fracture again noted. IMPRESSION: Improved RIGHT lung opacities and improved LEFT basilar aeration without other significant change. Electronically Signed   By: Harmon PierJeffrey  Hu M.D.   On: 12/19/2018 19:12    EKG: Independently reviewed.  It shows sinus rhythm with left bundle branch block and wide complex QRS  Assessment/Plan Principal Problem:   AMS (altered mental status) Active Problems:   Iron deficiency anemia   S/P TAVR (transcatheter aortic valve replacement)   Dementia with psychosis (HCC)   Dehydration   Right humeral fracture   Acute renal failure (ARF) (HCC)     #1 acute metabolic encephalopathy: Secondary to urinary tract infection probably now leading to dehydration and acute kidney injury.  Patient will be admitted and initiated on IV fluids.  Close monitoring of renal function.  #2 acute kidney injury: Most likely  prerenal from poor oral intake.  Initiate IV fluids and aggressive hydration.  Baseline creatinine was 0.7.  Continue to monitor  #  3 iron deficiency anemia: Continue close monitoring.  Iron therapy.  No evidence of GI bleed.  #3 dementia: Patient still confused and above her baseline.  We will continue to monitor closely.  #4 dehydration: Continue to hydrate as above.  #5 right humeral shaft fracture: Currently in a sling pain immobilized.  Continue per orthopedic recommendations  #6 history of aortic valve replacement: Stable.  Continue treatment   DVT prophylaxis: Apixaban Code Status: Full code Family Communication: Daughter at bedside Disposition Plan: To be determined Consults called: None Admission status: Inpatient  Severity of Illness: The appropriate patient status for this patient is INPATIENT. Inpatient status is judged to be reasonable and necessary in order to provide the required intensity of service to ensure the patient's safety. The patient's presenting symptoms, physical exam findings, and initial radiographic and laboratory data in the context of their chronic comorbidities is felt to place them at high risk for further clinical deterioration. Furthermore, it is not anticipated that the patient will be medically stable for discharge from the hospital within 2 midnights of admission. The following factors support the patient status of inpatient.   " The patient's presenting symptoms include recurrent fall and altered mental status. " The worrisome physical exam findings include dry mucous membranes. " The initial radiographic and laboratory data are worrisome because of creatinine of 1.32. " The chronic co-morbidities include dementia.   * I certify that at the point of admission it is my clinical judgment that the patient will require inpatient hospital care spanning beyond 2 midnights from the point of admission due to high intensity of service, high risk for further  deterioration and high frequency of surveillance required.Barbette Merino MD [facility] Pager [phone]  If 7PM-7AM, please contact night-coverage [email or web address] Password [email or web address]  12/19/2018, 9:55 PM

## 2018-12-19 NOTE — ED Triage Notes (Signed)
Pt from [facility]. Had [place] and cleared by state on 12/17/18.  Pt also fell last week and has fx humerus.  Hx of dementia.  Daughter is with pt, she is legal guardian. Was treated for UTI and finished abx but daughter reports still having some AMS like UTI did not go away.  Culture did not get run by PCP office.    Pt alert. Does not have hearing aid in.  NAD at this time. Unlabored. VSS.  Is incontinent, will need I&O urine once in room.  Fallen 4 times in less than 2 weeks per family.

## 2018-12-19 NOTE — ED Notes (Addendum)
Pt presented to the ED from [facility] with daughter at bedside. Per daughter, pt recently had a UTI and has been prescribed antibiotics. Per daughter, pt has not gotten any better. She is still very altered and agitation.   Pt is recovering from [place] which she was clear 2 days ago. Also has a fractured humerus from a fall and has a shoulder immobilizer placed.

## 2018-12-19 NOTE — ED Notes (Signed)
Several attempts by this RN and daughter to get pt to drink unsuccessful, will not attempt PO meds, pt not alert  MAR in pt's chart

## 2018-12-19 NOTE — ED Notes (Addendum)
[phone], MOBILE NUMBER PREFERRED<, daughter leaving bedside, has no ride home, brother coming to get daughter  Daughter with be taking pt's glasses, shoes and pull ups home, pt has on compression stocking

## 2018-12-19 NOTE — ED Provider Notes (Signed)
[facility] Emergency Department Provider Note   ____________________________________________   I have reviewed the triage vital signs and the nursing notes.   HISTORY  Chief Complaint Altered Mental Status   History limited by and level 5 caveat due to: AMS, history obtained from daughter at bedisde  HPI Kelsey Le is a 85 y.o. female who presents to the emergency department today because of concern for continued altered mental status thought secondary to UTI. Daughter states that the patient has had similar changes in mentation with UTIs in the past. She was given treatment for UTI through her PCP. Initially started on bactrim then switched to fosfomycin. Daughter has not noticed any improvement. Talked to PCP again today who recommened ER evaluation. They also had concern for dehydration.  Records reviewed. Per medical record review patient has a history of working with PCP for UA recently.   Past Medical History:  Diagnosis Date  . Anxiety   . Asthma   . AVM (arteriovenous malformation)    a. small intestine with recurrent GI bleeding.   . B12 deficiency   . Chronic diastolic CHF (congestive heart failure) ([place])   . Hip fracture ([place])    a. in the setting of mechanical fall  . Iron deficiency   . Iron deficiency anemia    a. requiring periodic transfusions  . Psychosis ([place])   . S/P TAVR (transcatheter aortic valve replacement)    a. 02/12/17: s/p TAVR w/ an Edwards Sapien 3 THV (size 23 mm, model # 9600TFX, serial # [id number])  . Severe aortic stenosis   . Syncope and collapse   . Thyroid disease     Patient Active Problem List   Diagnosis Date Noted  . Altered mental state 06/19/2018  . Chronic rhinitis 03/31/2017  . Vitamin B12 deficiency 03/31/2017  . Vitamin D deficiency 03/31/2017  . Left bundle branch block 03/28/2017  . Prolonged QT interval 03/28/2017  . Dementia with psychosis ([place]) 03/19/2017  . Pericardial effusion 02/15/2017   . S/P TAVR (transcatheter aortic valve replacement) 02/12/2017  . Acute on chronic diastolic heart failure ([place]) 02/12/2017  . Arteriovenous malformation (AVM) 02/12/2017  . Anemia 10/11/2016  . Iron deficiency anemia 01/15/2016  . Severe aortic stenosis 11/16/2015  . Lichen sclerosus 16/10/9602  . Lumbar radiculitis 12/16/2013  . Osteoarthritis of hip 07/06/2013  . Trochanteric bursitis 07/06/2013    Past Surgical History:  Procedure Laterality Date  . APPENDECTOMY    . BACK SURGERY     back fusion  . CARDIAC CATHETERIZATION    . ESOPHAGOGASTRODUODENOSCOPY (EGD) WITH PROPOFOL N/A 11/15/2015   Procedure: ESOPHAGOGASTRODUODENOSCOPY (EGD) WITH PROPOFOL;  Surgeon: Lucilla Lame, MD;  Location: [facility] ENDOSCOPY;  Service: Endoscopy;  Laterality: N/A;  . ESOPHAGOGASTRODUODENOSCOPY (EGD) WITH PROPOFOL N/A 10/12/2016   Procedure: ESOPHAGOGASTRODUODENOSCOPY (EGD) WITH PROPOFOL;  Surgeon: Lucilla Lame, MD;  Location: [facility] ENDOSCOPY;  Service: Endoscopy;  Laterality: N/A;  . FEMUR SURGERY     broken with rod  . FRACTURE SURGERY    . GIVENS CAPSULE STUDY N/A 02/20/2016   Procedure: GIVENS CAPSULE STUDY;  Surgeon: Manya Silvas, MD;  Location[facility] ENDOSCOPY;  Service: Endoscopy;  Laterality: N/A;  . HIP FRACTURE SURGERY    . INTRAMEDULLARY (IM) NAIL INTERTROCHANTERIC Right 11/18/2015   Procedure: INTRAMEDULLARY (IM) NAIL INTERTROCHANTRIC;  Surgeon: Thornton Park, MD;  Location: [facility] ORS;  Service: Orthopedics;  Laterality: Right;  . KYPHOPLASTY N/A 08/19/2014   Procedure: KYPHOPLASTY;  Surgeon: Hessie Knows, MD;  Location: [facility] ORS;  Service:  Orthopedics;  Laterality: N/A;  . PARTIAL HYSTERECTOMY    . RIGHT/LEFT HEART CATH AND CORONARY ANGIOGRAPHY N/A 10/05/2016   Procedure: RIGHT/LEFT HEART CATH AND CORONARY ANGIOGRAPHY;  Surgeon: Kathleene Hazel, MD;  [facility] INVASIVE CV LAB;  Service: Cardiovascular;  Laterality: N/A;  . TEE WITHOUT CARDIOVERSION N/A 02/12/2017   Procedure:  TRANSESOPHAGEAL ECHOCARDIOGRAM (TEE);  Surgeon: Kathleene Hazel, MD;  Location: [facility] OR;  Service: Open Heart Surgery;  Laterality: N/A;  . TRANSCATHETER AORTIC VALVE REPLACEMENT, TRANSFEMORAL N/A 02/12/2017   Procedure: TRANSCATHETER AORTIC VALVE REPLACEMENT, TRANSFEMORAL using a 35mm Edwards Sapien 3 Aortic Valve;  Surgeon: Kathleene Hazel, MD;  Location: [facility] OR;  Service: Open Heart Surgery;  Laterality: N/A;  . TUBAL LIGATION      Prior to Admission medications   Medication Sig Start Date End Date Taking? Authorizing Provider  acetaminophen (TYLENOL) 325 MG tablet Take 650 mg by mouth every 6 (six) hours as needed for moderate pain or headache.     [provider]  apixaban (ELIQUIS) 2.5 MG TABS tablet Take 1 tablet (2.5 mg total) by mouth 2 (two) times daily. 10/02/18   Iran Ouch, MD  Calcium Carbonate-Vitamin D (CALCIUM-VITAMIN D) 600-125 MG-UNIT TABS Take by mouth daily.    [provider]  Cholecalciferol (VITAMIN D-1000 MAX ST) 25 MCG (1000 UT) tablet TAKE (1) TABLET BY MOUTH ONCE A DAY 07/17/18   [provider]  clindamycin (CLEOCIN) 150 MG capsule Take 150 mg by mouth 3 (three) times daily. As needed for dental procedures 09/25/18   [provider]  clobetasol cream (TEMOVATE) 0.05 % Apply 1 application topically See admin instructions. Apply small amount vaginally on Monday and Friday    [provider]  estradiol (ESTRACE) 0.1 MG/GM vaginal cream Place 1 Applicatorful vaginally See admin instructions. Apply 0.5 grams vaginally Tuesday and Thursday morning    [provider]  furosemide (LASIX) 20 MG tablet Take 1 tablet (20 mg total) by mouth daily. 02/21/17 10/02/18  Janetta Hora, PA-C  iron polysaccharides (NIFEREX) 150 MG capsule Take 150 mg by mouth daily.    [provider]  levothyroxine (SYNTHROID, LEVOTHROID) 50 MCG tablet Take 50 mcg by mouth daily before breakfast.     [provider]  loratadine (CLARITIN) 10 MG tablet Take 10 mg by mouth daily.  02/26/17 10/02/18  [provider]  memantine (NAMENDA) 10 MG tablet Take 10 mg by mouth 2 (two) times daily.  07/11/17   [provider]  nitrofurantoin (MACRODANTIN) 50 MG capsule Take 1 capsule (50 mg total) by mouth at bedtime. 06/19/18   Mariel Craft, MD  omeprazole (PRILOSEC) 20 MG capsule Take 1 capsule (20 mg total) by mouth daily. 02/21/17   [name], MD  polyvinyl alcohol (LIQUIFILM TEARS) 1.4 % ophthalmic solution Place 1 drop into both eyes as needed for dry eyes.    [provider]  sertraline (ZOLOFT) 50 MG tablet Take 1 tablet (50 mg total) by mouth daily. 06/19/18   Mariel Craft, MD  vitamin B-12 (CYANOCOBALAMIN) 1000 MCG tablet Take 1,000 mcg by mouth daily.    [provider]    Allergies Aspirin and Penicillins  Family History  Problem Relation Age of Onset  . Heart block Mother   . Heart failure Father   . Heart disease Sister   . Breast cancer Maternal Aunt     Social History Social History   Tobacco Use  . Smoking status: Never Smoker  .  Smokeless tobacco: Never Used  Substance Use Topics  . Alcohol use: No  . Drug use: No    Review of Systems Unable to obtain reliable ROS from patient secondary to medical condition.  ____________________________________________   PHYSICAL EXAM:  VITAL SIGNS: ED Triage Vitals  Enc Vitals Group     BP 12/19/18 1311 130/63     Pulse Rate 12/19/18 1311 85     Resp 12/19/18 1311 14     Temp 12/19/18 1311 98.1 F (36.7 C)     Temp Source 12/19/18 1311 Oral     SpO2 12/19/18 1311 97 %     Weight 12/19/18 1312 125 lb (56.7 kg)     Height 12/19/18 1312 5\' 3"  (1.6 m)   Constitutional: Awake and alert. Not oriented.  Eyes: Conjunctivae are normal.  ENT      Head: Normocephalic and atraumatic.      Nose: No congestion/rhinnorhea.      Mouth/Throat: Mucous membranes are moist.      Neck: No  stridor. Hematological/Lymphatic/Immunilogical: No cervical lymphadenopathy. Cardiovascular: Normal rate, regular rhythm.  No murmurs, rubs, or gallops.  Respiratory: Normal respiratory effort without tachypnea nor retractions. Breath sounds are clear and equal bilaterally. No wheezes/rales/rhonchi. Gastrointestinal: Soft and non tender. No rebound. No guarding.  Genitourinary: Deferred Musculoskeletal: Normal range of motion in all extremities. No lower extremity edema. Neurologic: Not oriented to events. Moving all extremities Skin:  Skin is warm, dry and intact. No rash noted. Psychiatric: Mood and affect are normal. Speech and behavior are normal. Patient exhibits appropriate insight and judgment.  ____________________________________________    LABS (pertinent positives/negatives)  CBC wbc 8.7, hgb 8.9, plt 300 Creatinine 1.32 UA hazy, ketones 5, wbc 0-5 ____________________________________________   EKG  I, Phineas SemenGraydon Shenandoah Yeats, attending physician, personally viewed and interpreted this EKG  EKG Time: 1317 Rate: 88 Rhythm: normal sinus rhythm Axis: left axis deviation Intervals: qtc 503 QRS: LBBB ST changes: no st elevation Impression: abnormal ekg  ____________________________________________    RADIOLOGY  CT head Mild diffuse atrophy. No acute abnormality  CXR Improving lung opacities  ____________________________________________   PROCEDURES  Procedures  ____________________________________________   INITIAL IMPRESSION / ASSESSMENT AND PLAN / ED COURSE  Pertinent labs & imaging results that were available during my care of the patient were reviewed by me and considered in my medical decision making (see chart for details).   Patient presented to the emergency department today with concerns for continued UTI, haziness and dehydration.  Patient's urine here without findings consistent with urinary tract infection.  Creatinine was slightly elevated  concerning for acute kidney injury.  She was given fluids here however would not tolerate any oral intake.  At this point given concerns for continued kidney injury will plan admission.  Discussed plan with patient and family.  ____________________________________________   FINAL CLINICAL IMPRESSION(S) / ED DIAGNOSES  Final diagnoses:  Altered mental status     Note: This dictation was prepared with Dragon dictation. Any transcriptional errors that result from this process are unintentional     Phineas SemenGoodman, Hasson Gaspard, MD 12/20/18 0020

## 2018-12-20 ENCOUNTER — Inpatient Hospital Stay: Payer: Medicare Other

## 2018-12-20 LAB — MRSA PCR SCREENING: MRSA by PCR: NEGATIVE

## 2018-12-20 LAB — COMPREHENSIVE METABOLIC PANEL
ALT: 13 U/L (ref 0–44)
AST: 21 U/L (ref 15–41)
Albumin: 3.4 g/dL — ABNORMAL LOW (ref 3.5–5.0)
Alkaline Phosphatase: 76 U/L (ref 38–126)
Anion gap: 12 (ref 5–15)
BUN: 45 mg/dL — ABNORMAL HIGH (ref 8–23)
CO2: 19 mmol/L — ABNORMAL LOW (ref 22–32)
Calcium: 8.8 mg/dL — ABNORMAL LOW (ref 8.9–10.3)
Chloride: 115 mmol/L — ABNORMAL HIGH (ref 98–111)
Creatinine, Ser: 0.92 mg/dL (ref 0.44–1.00)
GFR calc Af Amer: >60 mL/min (ref >60)
GFR calc non Af Amer: 56 mL/min — ABNORMAL LOW (ref >60)
Glucose, Bld: 72 mg/dL (ref 70–99)
Potassium: 4 mmol/L (ref 3.5–5.1)
Sodium: 146 mmol/L — ABNORMAL HIGH (ref 135–145)
Total Bilirubin: 1.2 mg/dL (ref 0.3–1.2)
Total Protein: 6.7 g/dL (ref 6.5–8.1)

## 2018-12-20 LAB — BASIC METABOLIC PANEL
Anion gap: 13 (ref 5–15)
BUN: 36 mg/dL — ABNORMAL HIGH (ref 8–23)
CO2: 19 mmol/L — ABNORMAL LOW (ref 22–32)
Calcium: 8.9 mg/dL (ref 8.9–10.3)
Chloride: 115 mmol/L — ABNORMAL HIGH (ref 98–111)
Creatinine, Ser: 0.74 mg/dL (ref 0.44–1.00)
GFR calc Af Amer: >60 mL/min (ref >60)
GFR calc non Af Amer: >60 mL/min (ref >60)
Glucose, Bld: 75 mg/dL (ref 70–99)
Potassium: 3.9 mmol/L (ref 3.5–5.1)
Sodium: 147 mmol/L — ABNORMAL HIGH (ref 135–145)

## 2018-12-20 LAB — CBC
HCT: 28.1 % — ABNORMAL LOW (ref 36.0–46.0)
Hemoglobin: 8.2 g/dL — ABNORMAL LOW (ref 12.0–15.0)
MCH: 25.6 pg — ABNORMAL LOW (ref 26.0–34.0)
MCHC: 29.2 g/dL — ABNORMAL LOW (ref 30.0–36.0)
MCV: 87.8 fL (ref 80.0–100.0)
Platelets: 337 10*3/uL (ref 150–400)
RBC: 3.2 MIL/uL — ABNORMAL LOW (ref 3.87–5.11)
RDW: 21.8 % — ABNORMAL HIGH (ref 11.5–15.5)
WBC: 6.8 10*3/uL (ref 4.0–10.5)
nRBC: 0 % (ref 0.0–0.2)

## 2018-12-20 LAB — TSH: TSH: 3.607 u[IU]/mL (ref 0.350–4.500)

## 2018-12-20 LAB — T4, FREE: Free T4: 1.12 ng/dL (ref 0.61–1.12)

## 2018-12-20 LAB — VITAMIN B12: Vitamin B-12: 1266 pg/mL — ABNORMAL HIGH (ref 180–914)

## 2018-12-20 LAB — AMMONIA: Ammonia: 27 umol/L (ref 9–35)

## 2018-12-20 LAB — SARS CORONAVIRUS 2 (TAT 6-24 HRS): SARS Coronavirus 2: POSITIVE — AB

## 2018-12-20 MED ORDER — PANTOPRAZOLE SODIUM 40 MG PO TBEC: 40.0000 mg | DELAYED_RELEASE_TABLET | Freq: Every day | ORAL | Status: DC

## 2018-12-20 MED ORDER — MEMANTINE HCL 5 MG PO TABS: 10.0000 mg | ORAL_TABLET | Freq: Two times a day (BID) | ORAL | Status: DC

## 2018-12-20 MED ORDER — POLYSACCHARIDE IRON COMPLEX 150 MG PO CAPS
150.0000 mg | ORAL_CAPSULE | Freq: Every day | ORAL | Status: DC
  Filled 2018-12-20: qty 1

## 2018-12-20 MED ORDER — OLANZAPINE 5 MG PO TABS: 10.0000 mg | ORAL_TABLET | Freq: Every day | ORAL | Status: DC

## 2018-12-20 MED ORDER — FAMOTIDINE IN NACL 20-0.9 MG/50ML-% IV SOLN
20.0000 mg | Freq: Every day | INTRAVENOUS | Status: DC
  Administered 2018-12-21 – 2018-12-24 (×4): 20 mg via INTRAVENOUS
  Filled 2018-12-20 (×4): qty 50

## 2018-12-20 MED ORDER — LEVOTHYROXINE SODIUM 100 MCG/5ML IV SOLN
25.0000 ug | Freq: Every day | INTRAVENOUS | Status: DC
  Administered 2018-12-21 – 2018-12-24 (×4): 25 ug via INTRAVENOUS
  Filled 2018-12-20 (×6): qty 5

## 2018-12-20 MED ORDER — LEVOTHYROXINE SODIUM 50 MCG PO TABS: 50.0000 ug | ORAL_TABLET | Freq: Every day | ORAL | Status: DC

## 2018-12-20 MED ORDER — THIAMINE HCL 100 MG/ML IJ SOLN
500.0000 mg | Freq: Once | INTRAVENOUS | Status: AC
  Administered 2018-12-20: 21:00:00 500 mg via INTRAVENOUS
  Filled 2018-12-20: qty 5

## 2018-12-20 MED ORDER — HEPARIN SODIUM (PORCINE) 5000 UNIT/ML IJ SOLN
5000.0000 [IU] | Freq: Three times a day (TID) | INTRAMUSCULAR | Status: DC
  Administered 2018-12-20 – 2018-12-21 (×3): 5000 [IU] via SUBCUTANEOUS
  Filled 2018-12-20 (×3): qty 1

## 2018-12-20 MED ORDER — SERTRALINE HCL 50 MG PO TABS: 50.0000 mg | ORAL_TABLET | Freq: Every day | ORAL | Status: DC

## 2018-12-20 MED ORDER — DEXTROSE IN LACTATED RINGERS 5 % IV SOLN
INTRAVENOUS | Status: DC
  Filled 2018-12-20: qty 1000

## 2018-12-20 NOTE — Progress Notes (Signed)
PT Cancellation Note  Patient Details Name: Kelsey Le MRN: [id number] DOB: [birth year]   Cancelled Treatment:    Reason Eval/Treat Not Completed: Patient's level of consciousness.  Order received and chart reviewed.  Pt currently in a state of somnolence and has not awakened for medication, etc.  Unable to be aroused by verbal or physical stimuli at this time.  Pt's daughter at bedside and stated that she has "been like this since she got here".  Will continue to follow and see pt when appropriate.  Roxanne Gates, PT, DPT  Roxanne Gates 12/20/2018, 3:02 PM

## 2018-12-20 NOTE — ED Notes (Signed)
ED TO INPATIENT HANDOFF REPORT  ED Nurse Name and Phone #: Torrie Mayers, RN   S Name/Age/Gender Kelsey Le 85 y.o. female Room/Bed: ED33A/ED33A  Code Status   Code Status: Full Code  Home/SNF/Other Nursing Home Patient oriented to: self Is this baseline? No   Triage Complete: Triage complete  Chief Complaint AMS (altered mental status) [R41.82]  Triage Note [facility]. Had [place] and cleared by state on 12/17/18.  Pt also fell last week and has fx humerus.  Hx of dementia.  Daughter is with pt, she is legal guardian. Was treated for UTI and finished abx but daughter reports still having some AMS like UTI did not go away.  Culture did not get run by PCP office.    Pt alert. Does not have hearing aid in.  NAD at this time. Unlabored. VSS.  Is incontinent, will need I&O urine once in room.  Fallen 4 times in less than 2 weeks per family.    Allergies Allergies  Allergen Reactions  . Aspirin Other (See Comments)    GI bleeding   . Penicillins Rash and Other (See Comments)    PATIENT HAS HAD A PCN REACTION WITH IMMEDIATE RASH, FACIAL/TONGUE/THROAT SWELLING, SOB, OR LIGHTHEADEDNESS WITH HYPOTENSION:  #  #  #  YES  #  #  #   Has patient had a PCN reaction causing severe rash involving mucus membranes or skin necrosis: No Has patient had a PCN reaction that required hospitalization: No Has patient had a PCN reaction occurring within the last 10 years: No     Level of Care/Admitting Diagnosis ED Disposition    ED Disposition Condition [facility] Area: [facility] [[id number]]  Level of Care: Med-Surg [16]  Covid Evaluation: Asymptomatic Screening Protocol (No Symptoms)  Diagnosis: AMS (altered mental status) [[id number]]  Admitting Physician: Elwyn Reach [[id number]]  Attending Physician: Elwyn Reach [[id number]]  Estimated length of stay: past midnight tomorrow  Certification:: I certify this patient will need inpatient services for  at least 2 midnights       B Medical/Surgery History Past Medical History:  Diagnosis Date  . Anxiety   . Asthma   . AVM (arteriovenous malformation)    a. small intestine with recurrent GI bleeding.   . B12 deficiency   . Chronic diastolic CHF (congestive heart failure) ([place])   . Hip fracture ([place])    a. in the setting of mechanical fall  . Iron deficiency   . Iron deficiency anemia    a. requiring periodic transfusions  . Psychosis ([place])   . S/P TAVR (transcatheter aortic valve replacement)    a. 02/12/17: s/p TAVR w/ an Edwards Sapien 3 THV (size 23 mm, model # 9600TFX, serial # [id number])  . Severe aortic stenosis   . Syncope and collapse   . Thyroid disease    Past Surgical History:  Procedure Laterality Date  . APPENDECTOMY    . BACK SURGERY     back fusion  . CARDIAC CATHETERIZATION    . ESOPHAGOGASTRODUODENOSCOPY (EGD) WITH PROPOFOL N/A 11/15/2015   Procedure: ESOPHAGOGASTRODUODENOSCOPY (EGD) WITH PROPOFOL;  Surgeon: Lucilla Lame, MD;  Location: [facility] ENDOSCOPY;  Service: Endoscopy;  Laterality: N/A;  . ESOPHAGOGASTRODUODENOSCOPY (EGD) WITH PROPOFOL N/A 10/12/2016   Procedure: ESOPHAGOGASTRODUODENOSCOPY (EGD) WITH PROPOFOL;  Surgeon: Lucilla Lame, MD;  Location: [facility] ENDOSCOPY;  Service: Endoscopy;  Laterality: N/A;  . FEMUR SURGERY     broken with rod  . FRACTURE SURGERY    .  GIVENS CAPSULE STUDY N/A 02/20/2016   Procedure: GIVENS CAPSULE STUDY;  Surgeon: Scot Junobert T Elliott, MD;  [facility];  Service: Endoscopy;  Laterality: N/A;  . HIP FRACTURE SURGERY    . INTRAMEDULLARY (IM) NAIL INTERTROCHANTERIC Right 11/18/2015   Procedure: INTRAMEDULLARY (IM) NAIL INTERTROCHANTRIC;  Surgeon: Juanell FairlyKevin Krasinski, MD;  Location: [facility] ORS;  Service: Orthopedics;  Laterality: Right;  . KYPHOPLASTY N/A 08/19/2014   Procedure: KYPHOPLASTY;  Surgeon: Kennedy BuckerMichael Menz, MD;  Location: [facility] ORS;  Service: Orthopedics;  Laterality: N/A;  . PARTIAL HYSTERECTOMY    . RIGHT/LEFT HEART  CATH AND CORONARY ANGIOGRAPHY N/A 10/05/2016   Procedure: RIGHT/LEFT HEART CATH AND CORONARY ANGIOGRAPHY;  Surgeon: Kathleene HazelMcAlhany, Christopher D, MD;  [facility] INVASIVE CV LAB;  Service: Cardiovascular;  Laterality: N/A;  . TEE WITHOUT CARDIOVERSION N/A 02/12/2017   Procedure: TRANSESOPHAGEAL ECHOCARDIOGRAM (TEE);  Surgeon: Kathleene HazelMcAlhany, Christopher D, MD;  [facility];  Service: Open Heart Surgery;  Laterality: N/A;  . TRANSCATHETER AORTIC VALVE REPLACEMENT, TRANSFEMORAL N/A 02/12/2017   Procedure: TRANSCATHETER AORTIC VALVE REPLACEMENT, TRANSFEMORAL using a 23mm Edwards Sapien 3 Aortic Valve;  Surgeon: Kathleene HazelMcAlhany, Christopher D, MD;  Location: [facility] OR;  Service: Open Heart Surgery;  Laterality: N/A;  . TUBAL LIGATION       A IV Location/Drains/Wounds Patient Lines/Drains/Airways Status   Active Line/Drains/Airways    Name:   Placement date:   Placement time:   Site:   Days:   Peripheral IV 12/19/18 Left Forearm   12/19/18    1751    Forearm   1          Intake/Output Last 24 hours  Intake/Output Summary (Last 24 hours) at 12/20/2018 0016 Last data filed at 12/19/2018 1950 Gross per 24 hour  Intake 1000 ml  Output --  Net 1000 ml    Labs/Imaging Results for orders placed or performed during the hospital encounter of 12/19/18 (from the past 48 hour(s))  Comprehensive metabolic panel     Status: Abnormal   Collection Time: 12/19/18  1:16 PM  Result Value Ref Range   Sodium 142 135 - 145 mmol/L   Potassium 4.4 3.5 - 5.1 mmol/L   Chloride 109 98 - 111 mmol/L   CO2 18 (L) 22 - 32 mmol/L   Glucose, Bld 88 70 - 99 mg/dL   BUN QUANTITY NOT SUFFICIENT, UNABLE TO PERFORM TEST 8 - 23 mg/dL    Comment: C/COLLYN GILLISPIE AT 1415 12/19/2018.PMF   Creatinine, Ser QUANTITY NOT SUFFICIENT, UNABLE TO PERFORM TEST 0.44 - 1.00 mg/dL    Comment: C/COLLYN GILLISPIE AT 1415 12/19/2018.PMF   Calcium 9.4 8.9 - 10.3 mg/dL   Total Protein 7.5 6.5 - 8.1 g/dL   Albumin 3.8 3.5 - 5.0 g/dL   AST 28 15 - 41  U/L   ALT 17 0 - 44 U/L   Alkaline Phosphatase 82 38 - 126 U/L   Total Bilirubin 1.2 0.3 - 1.2 mg/dL   GFR calc non Af Amer NOT CALCULATED >60 mL/min   GFR calc Af Amer NOT CALCULATED >60 mL/min   Anion gap 15 5 - 15    Comment: Performed at [facility], [address] [place], [place] [address]  CBC     Status: Abnormal   Collection Time: 12/19/18  1:16 PM  Result Value Ref Range   WBC 8.7 4.0 - 10.5 K/uL   RBC 3.46 (L) 3.87 - 5.11 MIL/uL   Hemoglobin 8.9 (L) 12.0 - 15.0 g/dL   HCT 60.428.5 (L) 54.036.0 -  46.0 %   MCV 82.4 80.0 - 100.0 fL   MCH 25.7 (L) 26.0 - 34.0 pg   MCHC 31.2 30.0 - 36.0 g/dL   RDW 16.1 (H) 09.6 - 04.5 %   Platelets 300 150 - 400 K/uL   nRBC 0.0 0.0 - 0.2 %    Comment: Performed at [facility], [address] [place], [place] [address]  Urinalysis, Complete w Microscopic     Status: Abnormal   Collection Time: 12/19/18  5:39 PM  Result Value Ref Range   Color, Urine YELLOW (A) YELLOW   APPearance HAZY (A) CLEAR   Specific Gravity, Urine 1.017 1.005 - 1.030   pH 5.0 5.0 - 8.0   Glucose, UA NEGATIVE NEGATIVE mg/dL   Hgb urine dipstick NEGATIVE NEGATIVE   Bilirubin Urine NEGATIVE NEGATIVE   Ketones, ur 5 (A) NEGATIVE mg/dL   Protein, ur NEGATIVE NEGATIVE mg/dL   Nitrite NEGATIVE NEGATIVE   Leukocytes,Ua NEGATIVE NEGATIVE   WBC, UA 0-5 0 - 5 WBC/hpf   Bacteria, UA NONE SEEN NONE SEEN   Squamous Epithelial / LPF 0-5 0 - 5   Mucus PRESENT    Hyaline Casts, UA PRESENT     Comment: Performed at [facility], [address] [place], [place] [address]  BUN     Status: Abnormal   Collection Time: 12/19/18  5:50 PM  Result Value Ref Range   BUN 57 (H) 8 - 23 mg/dL    Comment: Performed at [facility], [address] [place], [place] [address]  Creatinine, serum     Status: Abnormal   Collection Time: 12/19/18  5:50 PM  Result Value Ref Range   Creatinine, Ser 1.32 (H) 0.44 - 1.00 mg/dL   GFR calc non Af Amer 36 (L)  >60 mL/min   GFR calc Af Amer 42 (L) >60 mL/min    Comment: Performed at [facility], [address] [place], [place] [address]   CT Head Wo Contrast  Result Date: 12/19/2018 CLINICAL DATA:  Altered mental status. EXAM: CT HEAD WITHOUT CONTRAST TECHNIQUE: Contiguous axial images were obtained from the base of the skull through the vertex without intravenous contrast. COMPARISON:  December 09, 2018. FINDINGS: Brain: Mild diffuse cortical atrophy is noted. Mild chronic ischemic white matter disease is noted. No mass effect or midline shift is noted. Ventricular size is within normal limits. There is no evidence of mass lesion, hemorrhage or acute infarction. Vascular: No hyperdense vessel or unexpected calcification. Skull: Normal. Negative for fracture or focal lesion. Sinuses/Orbits: No acute finding. Other: None. IMPRESSION: Mild diffuse cortical atrophy. Mild chronic ischemic white matter disease. No acute intracranial abnormality seen. Electronically Signed   By: Lupita Raider M.D.   On: 12/19/2018 17:03   DG Chest Port 1 View  Result Date: 12/19/2018 CLINICAL DATA:  Altered mental status. COVID. Recent RIGHT humeral fracture. EXAM: PORTABLE CHEST 1 VIEW COMPARISON:  12/09/2018 FINDINGS: The patient is rotated and leaning towards the RIGHT and this is a mildly low volume study. Mild RIGHT mid and lower lung opacities have slightly improved. Improved aeration in the LEFT lung base noted. No definite pleural effusion or pneumothorax. The cardiomediastinal silhouette is unchanged given technique. Aortic valve replacement again noted. Proximal RIGHT humeral fracture again noted. IMPRESSION: Improved RIGHT lung opacities and improved LEFT basilar aeration without other significant change. Electronically Signed   By: Harmon Pier M.D.   On: 12/19/2018 19:12    Pending Labs [employer] (From  admission, onward)    Start     Ordered   12/20/18 0500  Comprehensive metabolic panel  Tomorrow  morning,   STAT     12/19/18 2257   12/20/18 0500  CBC  Tomorrow morning,   STAT     12/19/18 2257   12/19/18 2241  SARS CORONAVIRUS 2 (TAT 6-24 HRS) Nasopharyngeal Nasopharyngeal Swab  (Tier 3 (TAT 6-24 hrs))  Once,   STAT    Question Answer Comment  Is this test for diagnosis or screening Screening   Symptomatic for COVID-19 as defined by CDC No   Hospitalized for COVID-19 No   Admitted to ICU for COVID-19 No   Previously tested for COVID-19 Yes   Resident in a congregate (group) care setting No   Employed in healthcare setting No   Pregnant No      12/19/18 2240   12/19/18 1813  Urine culture  Add-on,   AD     12/19/18 1812          Vitals/Pain Today's Vitals   12/19/18 1311 12/19/18 1312 12/19/18 1547 12/20/18 0006  BP: 130/63  (!) 161/64 (!) 154/59  Pulse: 85  82 94  Resp: 14  18 20   Temp: 98.1 F (36.7 C)   98.8 F (37.1 C)  TempSrc: Oral   Axillary  SpO2: 97%  98% 99%  Weight:  56.7 kg    Height:  5\' 3"  (1.6 m)    PainSc:    0-No pain    Isolation Precautions No active isolations  Medications Medications  apixaban (ELIQUIS) tablet 2.5 mg (has no administration in time range)  0.9 %  sodium chloride infusion (has no administration in time range)  acetaminophen (TYLENOL) tablet 650 mg (has no administration in time range)    Or  acetaminophen (TYLENOL) suppository 650 mg (has no administration in time range)  0.9 %  sodium chloride infusion ( Intravenous Stopped 12/19/18 1950)    Mobility walks with person assist High fall risk   Focused Assessments Neuro Assessment Handoff:  Swallow screen pass? Unable to assess pt does not follow instructions          Neuro Assessment:   Neuro Checks:      Last Documented NIHSS Modified Score:   Has TPA been given? No If patient is a Neuro Trauma and patient is going to OR before floor call report to 4N Charge nurse: [phone] or [phone]     R Recommendations: See Admitting Provider  Note  Report given to:   Additional Notes: Pt has right humerus fracture on immobilizer

## 2018-12-20 NOTE — Progress Notes (Signed)
Pt has tele monitoring orders. No tele box available. Paged NP Hassan Rowan and ED nurse asking if ED doctor could d/c tele monitoring order. Order still in place, still no tele box available. Charge nurse, AC, NP and CCMD also notified of issue

## 2018-12-20 NOTE — Progress Notes (Signed)
[facility] Progress Note  Patient: Kelsey Le GYK:[id number]   PCP: Idelle Crouch, MD DOB: 11-Apr-1930   DOA: 12/19/2018   DOS: 12/20/2018   Date of Service: the patient was seen and examined on 12/20/2018  Chief Complaint  Patient presents with  . Altered Mental Status   Brief hospital course: Pt. with PMH of dementia, A. fib, chronic diastolic CHF, AVM, IDA, hypothyroidism; presented with complain of confusion, was found to have acute metabolic encephalopathy.  Currently further plan is further work-up.  Subjective: Patient is minimally responsive.  No nausea no vomiting.  No other acute events overnight.  No fever no chills.  Assessment and Plan: Scheduled Meds: . heparin injection (subcutaneous)  5,000 Units Subcutaneous Q8H  . levothyroxine  25 mcg Intravenous Daily   Continuous Infusions: . dextrose 5% lactated ringers 75 mL/hr at 12/20/18 1946  . famotidine (PEPCID) IV    . thiamine injection     PRN Meds: acetaminophen **OR** acetaminophen  #1 acute metabolic encephalopathy: Secondary to urinary tract infection probably now leading to dehydration and acute kidney injury.  Patient will be admitted and initiated on IV fluids.  Close monitoring of renal function.  #2 acute kidney injury: Most likely prerenal from poor oral intake.  Initiate IV fluids and aggressive hydration.  Baseline creatinine was 0.7.  Continue to monitor  #3 iron deficiency anemia: Continue close monitoring.  Iron therapy.  No evidence of GI bleed.  #3 dementia: Patient still confused and above her baseline.  We will continue to monitor closely.  #4 dehydration: Continue to hydrate as above.  #5 right humeral shaft fracture: Currently in a sling pain immobilized.  Continue per orthopedic recommendations  #6 history of aortic valve replacement: Stable.  Continue treatment  7.  History of chronic anticoagulation for pulmonary embolism diagnosed in 2017. Patient is on  Eliquis. Currently holding it and rectally transitioning her to just DVT prophylaxis dose.  Diet: NPO pending speech eval DVT Prophylaxis: Subcutaneous Heparin    Advance goals of care discussion: DNR  Family Communication: family was present at bedside, at the time of interview. The pt provided permission to discuss medical plan with the family. Opportunity was given to ask question and all questions were answered satisfactorily.   Disposition:  Discharge to be determined.  Consultants: none Procedures: none  Antibiotics: Anti-infectives (From admission, onward)   None       Objective: Physical Exam: Vitals:   12/20/18 0119 12/20/18 0120 12/20/18 0814 12/20/18 1707  BP:  (!) 156/57 (!) 186/59 (!) 152/62  Pulse:  97 96 95  Resp:  17 16 16   Temp:  98.1 F (36.7 C) 97.8 F (36.6 C) 97.8 F (36.6 C)  TempSrc:  Oral    SpO2:  98% 100% 95%  Weight: 52.3 kg     Height:        Intake/Output Summary (Last 24 hours) at 12/20/2018 1949 Last data filed at 12/20/2018 1649 Gross per 24 hour  Intake 1570.19 ml  Output --  Net 1570.19 ml   Filed Weights   12/19/18 1312 12/20/18 0119  Weight: 56.7 kg 52.3 kg   General: lethargic and not oriented to time, place, and person. Appear in mild distress, affect unresponsive Eyes: PERRL, Conjunctiva normal ENT: Oral Mucosa Clear, moist  Neck: no JVD, no Abnormal Mass Or lumps Cardiovascular: S1 and S2 Present, aortic systolic Murmur,  Respiratory: good respiratory effort, Bilateral Air entry equal and Decreased, no signs of accessory muscle use, Clear to  Auscultation, no Crackles, no wheezes Abdomen: Bowel Sound present, Soft and difficult to assess  tenderness, no hernia Skin: no rashes  Extremities: no Pedal edema, no calf tenderness Neurologic: Minimally responsive, withdraws to painful stimuli.  Spontaneously moving all extremities as well.  Occasional tremors which appears to be chronic per daughter. Gait not checked due to  patient safety concerns  Data Reviewed: I have personally reviewed and interpreted daily labs, tele strips, imagings as discussed above. I reviewed all nursing notes, pharmacy notes, vitals, pertinent old records I have discussed plan of care as described above with RN and patient/family.  CBC: Recent Labs  Lab 12/19/18 1316 12/20/18 0424  WBC 8.7 6.8  HGB 8.9* 8.2*  HCT 28.5* 28.1*  MCV 82.4 87.8  PLT 300 337   Basic Metabolic Panel: Recent Labs  Lab 12/19/18 1316 12/19/18 1750 12/20/18 0424 12/20/18 1244  NA 142  --  146* 147*  K 4.4  --  4.0 3.9  CL 109  --  115* 115*  CO2 18*  --  19* 19*  GLUCOSE 88  --  72 75  BUN QUANTITY NOT SUFFICIENT, UNABLE TO PERFORM TEST 57* 45* 36*  CREATININE QUANTITY NOT SUFFICIENT, UNABLE TO PERFORM TEST 1.32* 0.92 0.74  CALCIUM 9.4  --  8.8* 8.9    Liver Function Tests: Recent Labs  Lab 12/19/18 1316 12/20/18 0424  AST 28 21  ALT 17 13  ALKPHOS 82 76  BILITOT 1.2 1.2  PROT 7.5 6.7  ALBUMIN 3.8 3.4*   No results for input(s): LIPASE, AMYLASE in the last 168 hours. Recent Labs  Lab 12/20/18 1244  AMMONIA 27   Coagulation Profile: No results for input(s): INR, PROTIME in the last 168 hours. Cardiac Enzymes: No results for input(s): CKTOTAL, CKMB, CKMBINDEX, TROPONINI in the last 168 hours. BNP (last 3 results) No results for input(s): PROBNP in the last 8760 hours. CBG: No results for input(s): GLUCAP in the last 168 hours. Studies: MR BRAIN WO CONTRAST  Result Date: 12/20/2018 CLINICAL DATA:  Encephalopathy. EXAM: MRI HEAD WITHOUT CONTRAST TECHNIQUE: Multiplanar, multiecho pulse sequences of the brain and surrounding structures were obtained without intravenous contrast. COMPARISON:  Head CT 12/19/2018 and MRI 11/12/2014 FINDINGS: Brain: There is a 3 mm focus of hyperintense trace diffusion weighted signal in the subcortical white matter of the right parietal lobe with suggestion of subtly reduced ADC. No acute infarct  is evident elsewhere. No intracranial hemorrhage, mass, midline shift, or extra-axial fluid collection is identified. Periventricular white matter T2 hyperintensities are unchanged from the prior MRI and nonspecific but compatible with chronic small vessel ischemia, minimal for age. Punctate chronic infarcts are suspected in the cerebellum bilaterally, not clearly present on the prior MRI. Generalized cerebral atrophy has mildly progressed from the prior MRI. Vascular: Major intracranial vascular flow voids are preserved. Skull and upper cervical spine: Unremarkable bone marrow signal. Sinuses/Orbits: Bilateral cataract extraction. Clear paranasal sinuses. Trace bilateral mastoid fluid. Other: None. IMPRESSION: 1. Suspected punctate acute/early subacute right parietal infarct. 2. Minimal chronic small vessel ischemic disease with punctate chronic cerebellar infarcts. Electronically Signed   By: Sebastian Ache M.D.   On: 12/20/2018 19:22     Time spent: 35 minutes  Author: Lynden Oxford, MD [facility] 12/20/2018 7:49 PM  To reach On-call, see care teams to locate the attending and reach out to them via [email or web address]. If 7PM-7AM, please contact night-coverage If you still have difficulty reaching the attending provider, please page the [facility] (Director on Call) for  [facility] on amion for assistance.

## 2018-12-20 NOTE — Progress Notes (Signed)
Chart reviewed. Pt is unable to participate with swallow eval at this time secondary to lethargy. Reattempt when pt is alert and able to participate.Currently NPO

## 2018-12-20 NOTE — Progress Notes (Signed)
Patient sleeping soundly and does not wake up to take meds.

## 2018-12-20 NOTE — Progress Notes (Signed)
Patient s/p positive COVID test 12/8 at [facility]. Patient cleared by health department prior to admission. Upon admission patient retested with positive COVID swab. Patient with mild sx and 10 days post initial positive testing. Per Stephania Fragmin, ID nurse patient will not require isolation and could potentially test positive x 3 months. Repeat testing is not advised.

## 2018-12-21 ENCOUNTER — Inpatient Hospital Stay: Payer: Medicare Other

## 2018-12-21 DIAGNOSIS — I639 Cerebral infarction, unspecified: Secondary | ICD-10-CM

## 2018-12-21 DIAGNOSIS — E870 Hyperosmolality and hypernatremia: Secondary | ICD-10-CM

## 2018-12-21 DIAGNOSIS — G9341 Metabolic encephalopathy: Secondary | ICD-10-CM | POA: Diagnosis present

## 2018-12-21 DIAGNOSIS — F0391 Unspecified dementia with behavioral disturbance: Secondary | ICD-10-CM

## 2018-12-21 DIAGNOSIS — E860 Dehydration: Secondary | ICD-10-CM

## 2018-12-21 LAB — FERRITIN: Ferritin: 104 ng/mL (ref 11–307)

## 2018-12-21 LAB — ABO/RH: ABO/RH(D): A POS

## 2018-12-21 LAB — C-REACTIVE PROTEIN: CRP: 3.7 mg/dL — ABNORMAL HIGH (ref <1.0)

## 2018-12-21 LAB — URINE CULTURE: Culture: NO GROWTH

## 2018-12-21 LAB — FIBRINOGEN: Fibrinogen: 408 mg/dL (ref 210–475)

## 2018-12-21 LAB — PROCALCITONIN: Procalcitonin: <0.1 ng/mL

## 2018-12-21 LAB — LACTATE DEHYDROGENASE: LDH: 277 U/L — ABNORMAL HIGH (ref 98–192)

## 2018-12-21 MED ORDER — THIAMINE HCL 100 MG PO TABS: 100.0000 mg | ORAL_TABLET | Freq: Every day | ORAL | Status: DC

## 2018-12-21 MED ORDER — SODIUM CHLORIDE 0.9 % IV SOLN
200.0000 mg | Freq: Once | INTRAVENOUS | Status: DC
  Filled 2018-12-21: qty 40

## 2018-12-21 MED ORDER — APIXABAN 2.5 MG PO TABS: 2.5000 mg | ORAL_TABLET | Freq: Two times a day (BID) | ORAL | Status: DC

## 2018-12-21 MED ORDER — ZINC SULFATE 220 (50 ZN) MG PO CAPS
220.0000 mg | ORAL_CAPSULE | Freq: Every day | ORAL | Status: DC
  Filled 2018-12-21: qty 1

## 2018-12-21 MED ORDER — SODIUM CHLORIDE 0.9 % IV SOLN: 100.0000 mg | Freq: Every day | INTRAVENOUS | Status: DC

## 2018-12-21 MED ORDER — ASCORBIC ACID 500 MG PO TABS: 500.0000 mg | ORAL_TABLET | Freq: Every day | ORAL | Status: DC

## 2018-12-21 NOTE — Progress Notes (Addendum)
PROGRESS NOTE    Kelsey Le  VQX:[id number] DOB: 1930-07-30 DOA: 12/19/2018  PCP: Marguarite Arbour, MD    LOS - 2   Brief Narrative:  85 y.o. female, ALF resident, with medical history significant of diastolic CHF, pulmonary embolism on chronic anticoagulation, dementia, recurrent AVMs, asthma, iron deficiency anemia, recurrent UTI's, who has had recent recurrent falls at the facility including right humeral shaft fracture currently in immobilizer.  Brought to ED on 12/18 due to worsening mental status.  In the ED, afebrile, mildly hypertensive, otherwise normal vitals.  Labs were notable for AKI and dehydration, stable anemia.  UA negative for UTI.  CXR negative.  Head CT without acute findings.  Patient treated with IV fluids and admitted for further management.    Of note, patient recently had Covid-19 infection but was cleared by state health department on 12/16, so is therefore no longer on precautions.  Subjective 12/20: Patient awake with eyes close laying in bed.  No acute events reported.  Patient is unable to tell me her name.  Keeps talking about something in the mailbox.  Appears in no acute distress.  Assessment & Plan:   Principal Problem:   Acute metabolic encephalopathy Active Problems:   Hypernatremia   Iron deficiency anemia   S/P TAVR (transcatheter aortic valve replacement)   Dementia with psychosis (HCC)   Dehydration   Right humeral fracture   Acute renal failure (ARF) (HCC)   Acute metabolic encephalopathy -suspect this is secondary to dehydration and acute renal failure, possible recent Covid contributing.  Urinalysis ruled out UTI.  Glucose has been within normal limits.  Head CT was negative on admission.  Does not appear to be on medications outpatient that would cause this.  MRI obtained and showed acute/early subacute right parietal infarct.  Acute / Subacute CVA MRI showed R parietal infarct.  Suspect this to  --resume Eliquis (per daughter is  unable to take ASA)   Per daughter, Dr. Kirke Corin changed to low dose Eliquis due to history of GI bleeding due to AVM's.     Per daughter, patient apparently had severe bleed while on ASA, has tolerated Eliquis without bleeding. --consult Neurology --carotid dopplers  --Echo  --telemetry monitoring --lipid panel w/ AM labs  Hypernatremia - sodium 142 on admission, now 147.  Due to poor p.o. intake and free water deficit. Free water deficit is 1.2 L. --Continue D5W-LR for now, may require just D5 if not improving  --Monitor BMP closely  Acute kidney injury, present on admission.  Prerenal azotemia due to poor p.o. intake.  Resolved with IV hydration. Creatinine on admission 1.32.  Baseline appears to be 0.7-0.9. --Monitor renal function  Dehydration - due to poor p.o. intake, causing AKI and hypernatremia, and likely altered mental status. Could be due to advancing dementia versus recent illnesses including Covid and UTI, or multifactorial. --IV hydration as above  Iron deficiency anemia -chronic, stable No evidence of active bleeding.  Baseline hemoglobin appears to be around 9-10.  Dementia without behavioral disturbance - currently altered from her baseline as outlined above. --Continue to monitor closely  Right humeral shaft fracture -Maintain sling and immobilizer to right upper extremity --Monitor for signs of pain  History of aortic valve replacement / TAVR -stable.  History of PE on chronic anticoagulation -acute PE diagnosed in 2017, on Eliquis since that time. --Hold Eliquis, unlikely patient needs it continued this far out unless unprovoked.  Will chart review and discuss with family.   DVT prophylaxis:  Heparin   Code Status: DNR  Family Communication: Daughter updated at bedside Disposition Plan: To be determined, expect return to her facility   Consultants:   None  Procedures:   None  Antimicrobials:   None   Objective: Vitals:   12/20/18 0120  12/20/18 0814 12/20/18 1707 12/20/18 2301  BP: (!) 156/57 (!) 186/59 (!) 152/62 (!) 145/70  Pulse: 97 96 95 (!) 107  Resp: Temp: 98.1 F (36.7 C) 97.8 F (36.6 C) 97.8 F (36.6 C) 97.9 F (36.6 C)  TempSrc: Oral     SpO2: 98% 100% 95% 95%  Weight:      Height:        Intake/Output Summary (Last 24 hours) at 12/21/2018 1359 Last data filed at 12/21/2018 0400 Gross per 24 hour  Intake 680.86 ml  Output --  Net 680.86 ml   Filed Weights   12/19/18 1312 12/20/18 0119  Weight: 56.7 kg 52.3 kg    Examination:  General exam: no acute distress, frail, awake with eyes closed HEENT: dry mucus membranes, hearing grossly normal  Respiratory system: clear to auscultation anteriorly, no wheezes, rales or rhonchi, normal respiratory effort. Cardiovascular system: normal S1/S2, RRR, systolic murmur, no pedal edema.   Gastrointestinal system: soft, non-tender, non-distended abdomen Central nervous system: disoriented. no gross focal neurologic deficits Extremities: moves all, no edema, normal tone Skin: dry, intact, normal temperature, large area of ecchymosis on right chest wall above immobilizer Psychiatry: unable to evaluate at this time   Data Reviewed: I have personally reviewed following labs and imaging studies  CBC: Recent Labs  Lab 12/19/18 1316 12/20/18 0424  WBC 8.7 6.8  HGB 8.9* 8.2*  HCT 28.5* 28.1*  MCV 82.4 87.8  PLT 300 337   Basic Metabolic Panel: Recent Labs  Lab 12/19/18 1316 12/19/18 1750 12/20/18 0424 12/20/18 1244  NA 142  --  146* 147*  K 4.4  --  4.0 3.9  CL 109  --  115* 115*  CO2 18*  --  19* 19*  GLUCOSE 88  --  72 75  BUN QUANTITY NOT SUFFICIENT, UNABLE TO PERFORM TEST 57* 45* 36*  CREATININE QUANTITY NOT SUFFICIENT, UNABLE TO PERFORM TEST 1.32* 0.92 0.74  CALCIUM 9.4  --  8.8* 8.9   GFR: Estimated Creatinine Clearance: 41.7 mL/min (by C-G formula based on SCr of 0.74 mg/dL). Liver Function Tests: Recent Labs  Lab  12/19/18 1316 12/20/18 0424  AST 28 21  ALT 17 13  ALKPHOS 82 76  BILITOT 1.2 1.2  PROT 7.5 6.7  ALBUMIN 3.8 3.4*   No results for input(s): LIPASE, AMYLASE in the last 168 hours. Recent Labs  Lab 12/20/18 1244  AMMONIA 27   Coagulation Profile: No results for input(s): INR, PROTIME in the last 168 hours. Cardiac Enzymes: No results for input(s): CKTOTAL, CKMB, CKMBINDEX, TROPONINI in the last 168 hours. BNP (last 3 results) No results for input(s): PROBNP in the last 8760 hours. HbA1C: No results for input(s): HGBA1C in the last 72 hours. CBG: No results for input(s): GLUCAP in the last 168 hours. Lipid Profile: No results for input(s): CHOL, HDL, LDLCALC, TRIG, CHOLHDL, LDLDIRECT in the last 72 hours. Thyroid Function Tests: Recent Labs    12/20/18 1244  TSH 3.607  FREET4 1.12   Anemia Panel: Recent Labs    12/20/18 1501 12/21/18 0843  VITAMINB12 1,266*  --   FERRITIN  --  104   Sepsis Labs: Recent Labs  Lab 12/21/18  [place] <0.10    Recent Results (from the past 240 hour(s))  Urine culture     Status: None   Collection Time: 12/19/18  5:39 PM   Specimen: Urine, Catheterized  Result Value Ref Range Status   Specimen Description   Final    URINE, CATHETERIZED Performed at [facility], [address] [place], [place] [address]    Special Requests   Final    NONE Performed at [facility], [address] [place], [place] [address]    Culture   Final    NO GROWTH Performed at [facility], [place] [address], [place], [place] [address]    Report Status 12/21/2018 FINAL  Final  SARS CORONAVIRUS 2 (TAT 6-24 HRS) Nasopharyngeal Nasopharyngeal Swab     Status: Abnormal   Collection Time: 12/19/18 10:54 PM   Specimen: Nasopharyngeal Swab  Result Value Ref Range Status   SARS Coronavirus 2 POSITIVE (A) NEGATIVE Final    Comment: RESULT CALLED TO, READ BACK BY AND VERIFIED WITH: JADIE COHEN RN.@[email or web address]  ON 12.19.2020 BY  TCALDWELL MT. (NOTE) SARS-CoV-2 target nucleic acids are DETECTED. The SARS-CoV-2 RNA is generally detectable in upper and lower respiratory specimens during the acute phase of infection. Positive results are indicative of the presence of SARS-CoV-2 RNA. Clinical correlation with patient history and other diagnostic information is  necessary to determine patient infection status. Positive results do not rule out bacterial infection or co-infection with other viruses.  The expected result is Negative. Fact Sheet for Patients: SugarRoll.be Fact Sheet for Healthcare Providers: https://www.woods-mathews.com/ This test is not yet approved or cleared by the [place] FDA and  has been authorized for detection and/or diagnosis of SARS-CoV-2 by FDA under an Emergency Use Authorization (EUA). This EUA will remain  in effect (meaning this test can  be used) for the duration of the COVID-19 declaration under Section 564(b)(1) of the Act, 21 U.S.C. section 360bbb-3(b)(1), unless the authorization is terminated or revoked sooner. Performed at [facility], [place] [address], [place], [place] [address]   MRSA PCR Screening     Status: None   Collection Time: 12/20/18  1:27 AM   Specimen: Nasopharyngeal  Result Value Ref Range Status   MRSA by PCR NEGATIVE NEGATIVE Final    Comment:        The GeneXpert MRSA Assay (FDA approved for NASAL specimens only), is one component of a comprehensive MRSA colonization surveillance program. It is not intended to diagnose MRSA infection nor to guide or monitor treatment for MRSA infections. Performed at [facility], [address], [place], [place] [address]          Radiology Studies: CT Head Wo Contrast  Result Date: 12/19/2018 CLINICAL DATA:  Altered mental status. EXAM: CT HEAD WITHOUT CONTRAST TECHNIQUE: Contiguous axial images were obtained from the base of the skull through the  vertex without intravenous contrast. COMPARISON:  December 09, 2018. FINDINGS: Brain: Mild diffuse cortical atrophy is noted. Mild chronic ischemic white matter disease is noted. No mass effect or midline shift is noted. Ventricular size is within normal limits. There is no evidence of mass lesion, hemorrhage or acute infarction. Vascular: No hyperdense vessel or unexpected calcification. Skull: Normal. Negative for fracture or focal lesion. Sinuses/Orbits: No acute finding. Other: None. IMPRESSION: Mild diffuse cortical atrophy. Mild chronic ischemic white matter disease. No acute intracranial abnormality seen. Electronically Signed   By: Marijo Conception M.D.   On: 12/19/2018 17:03  MR BRAIN WO CONTRAST  Result Date: 12/20/2018 CLINICAL DATA:  Encephalopathy. EXAM: MRI HEAD WITHOUT CONTRAST TECHNIQUE: Multiplanar, multiecho pulse sequences of the brain and surrounding structures were obtained without intravenous contrast. COMPARISON:  Head CT 12/19/2018 and MRI 11/12/2014 FINDINGS: Brain: There is a 3 mm focus of hyperintense trace diffusion weighted signal in the subcortical white matter of the right parietal lobe with suggestion of subtly reduced ADC. No acute infarct is evident elsewhere. No intracranial hemorrhage, mass, midline shift, or extra-axial fluid collection is identified. Periventricular white matter T2 hyperintensities are unchanged from the prior MRI and nonspecific but compatible with chronic small vessel ischemia, minimal for age. Punctate chronic infarcts are suspected in the cerebellum bilaterally, not clearly present on the prior MRI. Generalized cerebral atrophy has mildly progressed from the prior MRI. Vascular: Major intracranial vascular flow voids are preserved. Skull and upper cervical spine: Unremarkable bone marrow signal. Sinuses/Orbits: Bilateral cataract extraction. Clear paranasal sinuses. Trace bilateral mastoid fluid. Other: None. IMPRESSION: 1. Suspected punctate acute/early  subacute right parietal infarct. 2. Minimal chronic small vessel ischemic disease with punctate chronic cerebellar infarcts. Electronically Signed   By: Sebastian AcheAllen  Grady M.D.   On: 12/20/2018 19:22   DG Chest Port 1 View  Result Date: 12/19/2018 CLINICAL DATA:  Altered mental status. COVID. Recent RIGHT humeral fracture. EXAM: PORTABLE CHEST 1 VIEW COMPARISON:  12/09/2018 FINDINGS: The patient is rotated and leaning towards the RIGHT and this is a mildly low volume study. Mild RIGHT mid and lower lung opacities have slightly improved. Improved aeration in the LEFT lung base noted. No definite pleural effusion or pneumothorax. The cardiomediastinal silhouette is unchanged given technique. Aortic valve replacement again noted. Proximal RIGHT humeral fracture again noted. IMPRESSION: Improved RIGHT lung opacities and improved LEFT basilar aeration without other significant change. Electronically Signed   By: Harmon PierJeffrey  Hu M.D.   On: 12/19/2018 19:12        Scheduled Meds: . vitamin C  500 mg Oral Daily  . heparin injection (subcutaneous)  5,000 Units Subcutaneous Q8H  . levothyroxine  25 mcg Intravenous Daily  . thiamine  100 mg Oral Daily   Continuous Infusions: . dextrose 5% lactated ringers 75 mL/hr at 12/21/18 0514  . famotidine (PEPCID) IV 20 mg (12/21/18 0921)     LOS: 2 days    Time spent: 35-40 minutes    Pennie BanterKelly A Veta Dambrosia, DO [facility]   If 7PM-7AM, please contact night-coverage [email or web address] Password [email or web address] 12/21/2018, 1:59 PM

## 2018-12-21 NOTE — Consult Note (Addendum)
Reason for Consult:AMS Referring Physician: Arbutus Ped  CC: AMS, recent falls  HPI: Kelsey Le is an 85 y.o. female with a history of multiple medical problems including dementia who was admitted with AMS.  Patient is an ALF resident.  Was visited by her daughter on 12/18 and noted patient to be altered.  Initial concern was for UTI but with no improvement with antibiotics patient was admitted.  Patient noted to have AKI and dehydration.  Has had recent diagnosis of COVID as well.   LKW unknown therefore patient not a tPA candidate due to unclear LKW.    Past Medical History:  Diagnosis Date  . Anxiety   . Asthma   . AVM (arteriovenous malformation)    a. small intestine with recurrent GI bleeding.   . B12 deficiency   . Chronic diastolic CHF (congestive heart failure) ([place])   . Hip fracture ([place])    a. in the setting of mechanical fall  . Iron deficiency   . Iron deficiency anemia    a. requiring periodic transfusions  . Psychosis ([place])   . S/P TAVR (transcatheter aortic valve replacement)    a. 02/12/17: s/p TAVR w/ an Edwards Sapien 3 THV (size 23 mm, model # 9600TFX, serial # [id number])  . Severe aortic stenosis   . Syncope and collapse   . Thyroid disease     Past Surgical History:  Procedure Laterality Date  . APPENDECTOMY    . BACK SURGERY     back fusion  . CARDIAC CATHETERIZATION    . ESOPHAGOGASTRODUODENOSCOPY (EGD) WITH PROPOFOL N/A 11/15/2015   Procedure: ESOPHAGOGASTRODUODENOSCOPY (EGD) WITH PROPOFOL;  Surgeon: Lucilla Lame, MD;  Location: [facility] ENDOSCOPY;  Service: Endoscopy;  Laterality: N/A;  . ESOPHAGOGASTRODUODENOSCOPY (EGD) WITH PROPOFOL N/A 10/12/2016   Procedure: ESOPHAGOGASTRODUODENOSCOPY (EGD) WITH PROPOFOL;  Surgeon: Lucilla Lame, MD;  Location: [facility] ENDOSCOPY;  Service: Endoscopy;  Laterality: N/A;  . FEMUR SURGERY     broken with rod  . FRACTURE SURGERY    . GIVENS CAPSULE STUDY N/A 02/20/2016   Procedure: GIVENS CAPSULE STUDY;  Surgeon: Manya Silvas, MD;  Location: [facility] ENDOSCOPY;  Service: Endoscopy;  Laterality: N/A;  . HIP FRACTURE SURGERY    . INTRAMEDULLARY (IM) NAIL INTERTROCHANTERIC Right 11/18/2015   Procedure: INTRAMEDULLARY (IM) NAIL INTERTROCHANTRIC;  Surgeon: Thornton Park, MD;  Location: [facility] ORS;  Service: Orthopedics;  Laterality: Right;  . KYPHOPLASTY N/A 08/19/2014   Procedure: KYPHOPLASTY;  Surgeon: Hessie Knows, MD;  Location: [facility] ORS;  Service: Orthopedics;  Laterality: N/A;  . PARTIAL HYSTERECTOMY    . RIGHT/LEFT HEART CATH AND CORONARY ANGIOGRAPHY N/A 10/05/2016   Procedure: RIGHT/LEFT HEART CATH AND CORONARY ANGIOGRAPHY;  Surgeon: Burnell Blanks, MD;  Location: [facility] CV LAB;  Service: Cardiovascular;  Laterality: N/A;  . TEE WITHOUT CARDIOVERSION N/A 02/12/2017   Procedure: TRANSESOPHAGEAL ECHOCARDIOGRAM (TEE);  Surgeon: Burnell Blanks, MD;  Location: [place];  Service: Open Heart Surgery;  Laterality: N/A;  . TRANSCATHETER AORTIC VALVE REPLACEMENT, TRANSFEMORAL N/A 02/12/2017   Procedure: TRANSCATHETER AORTIC VALVE REPLACEMENT, TRANSFEMORAL using a 10mm Edwards Sapien 3 Aortic Valve;  Surgeon: Burnell Blanks, MD;  Location: [facility];  Service: Open Heart Surgery;  Laterality: N/A;  . TUBAL LIGATION      Family History  Problem Relation Age of Onset  . Heart block Mother   . Heart failure Father   . Heart disease Sister   . Breast cancer Maternal Aunt     Social History:  reports that she  has never smoked. She has never used smokeless tobacco. She reports that she does not drink alcohol or use drugs.  Allergies  Allergen Reactions  . Aspirin Other (See Comments)    GI bleeding   . Penicillins Rash and Other (See Comments)    PATIENT HAS HAD A PCN REACTION WITH IMMEDIATE RASH, FACIAL/TONGUE/THROAT SWELLING, SOB, OR LIGHTHEADEDNESS WITH HYPOTENSION:  #  #  #  YES  #  #  #   Has patient had a PCN reaction causing severe rash involving mucus membranes or skin necrosis: No Has  patient had a PCN reaction that required hospitalization: No Has patient had a PCN reaction occurring within the last 10 years: No     Medications:  I have reviewed the patient's current medications. Prior to Admission:  Medications Prior to Admission  Medication Sig Dispense Refill Last Dose  . acetaminophen (TYLENOL) 325 MG tablet Take 650 mg by mouth every 6 (six) hours as needed.   prn at prn  . Acetaminophen 500 MG coapsule Take 500 mg by mouth 3 (three) times daily.    12/19/2018 at 0600  . apixaban (ELIQUIS) 2.5 MG TABS tablet Take 1 tablet (2.5 mg total) by mouth 2 (two) times daily. 180 tablet 2 12/19/2018 at 0600  . Calcium Carbonate-Vitamin D (CALCIUM-VITAMIN D) 600-125 MG-UNIT TABS Take by mouth daily.   12/19/2018 at 0600  . Cholecalciferol (VITAMIN D-1000 MAX ST) 25 MCG (1000 UT) tablet TAKE (1) TABLET BY MOUTH ONCE A DAY   12/19/2018 at 0600  . clobetasol cream (TEMOVATE) 0.05 % Apply 1 application topically See admin instructions. Apply small amount vaginally on Monday and Friday   12/18/2018 at 2000  . estradiol (ESTRACE) 0.1 MG/GM vaginal cream Place 1 Applicatorful vaginally See admin instructions. Apply 0.5 grams vaginally Tuesday and Thursday morning   Past Week at 2000  . furosemide (LASIX) 20 MG tablet Take 1 tablet (20 mg total) by mouth daily. 5 tablet 0 12/19/2018 at 0600  . iron polysaccharides (NIFEREX) 150 MG capsule Take 150 mg by mouth daily.   12/19/2018 at 0600  . levothyroxine (SYNTHROID, LEVOTHROID) 50 MCG tablet Take 50 mcg by mouth daily before breakfast.    12/19/2018 at 0600  . loperamide (IMODIUM) 2 MG capsule Take 2 mg by mouth 2 (two) times daily.   12/19/2018 at 0600  . loratadine (CLARITIN) 10 MG tablet Take 10 mg by mouth daily.    12/19/2018 at 0600  . memantine (NAMENDA) 10 MG tablet Take 10 mg by mouth 2 (two) times daily.   0 12/19/2018 at 0600  . nitrofurantoin (MACRODANTIN) 50 MG capsule Take 1 capsule (50 mg total) by mouth at bedtime. 30  capsule 2 12/18/2018 at 2000  . nystatin (NYSTATIN) powder Apply 1 application topically 2 (two) times daily as needed.   prn at prn  . OLANZapine (ZYPREXA) 10 MG tablet Take 10 mg by mouth at bedtime.   12/18/2018 at 2000  . omeprazole (PRILOSEC) 20 MG capsule Take 1 capsule (20 mg total) by mouth daily. 90 capsule 3 12/19/2018 at 0600  . polyvinyl alcohol (LIQUIFILM TEARS) 1.4 % ophthalmic solution Place 1 drop into both eyes as needed for dry eyes.   prn at prn  . sennosides-docusate sodium (SENOKOT-S) 8.6-50 MG tablet Take 1 tablet by mouth daily.   12/19/2018 at 0600  . sertraline (ZOLOFT) 50 MG tablet Take 1 tablet (50 mg total) by mouth daily. 30 tablet 2 12/19/2018 at 0600  . vitamin B-12 (CYANOCOBALAMIN)  100 MCG tablet Take 1,000 mcg by mouth daily.    12/19/2018 at 0600  . clindamycin (CLEOCIN) 150 MG capsule Take 150 mg by mouth 3 (three) times daily. As needed for dental procedures   prn at prn  . sulfamethoxazole-trimethoprim (BACTRIM DS) 800-160 MG tablet Take 1 tablet by mouth 2 (two) times daily.   Completed Course at Unknown time   Scheduled: . apixaban  2.5 mg Oral BID  . vitamin C  500 mg Oral Daily  . levothyroxine  25 mcg Intravenous Daily  . thiamine  100 mg Oral Daily    ROS: Unable to provide due to mental status  Physical Examination: Blood pressure (!) 145/70, pulse (!) 107, temperature 97.9 F (36.6 C), resp. rate 16, height  (1.6 m), weight 52.3 kg, SpO2 95 %.  HEENT-  Normocephalic, no lesions, without obvious abnormality.  Normal external eye and conjunctiva.  Normal TM's bilaterally.  Normal auditory canals and external ears. Normal external nose, mucus membranes and septum.  Normal pharynx. Cardiovascular- S1, S2 normal, pulses palpable throughout   Lungs- chest clear, no wheezing, rales, normal symmetric air entry,  Abdomen- soft, non-tender; bowel sounds normal; no masses,  no organomegaly Extremities- no edema Lymph-no adenopathy  palpable Musculoskeletal-right arm in sling Skin-multiple areas of bruising  Neurological Examination   Mental Status: Alert, perseverates.  Not oriented.  Follows some simple commands.  Speech fluent but often not related to conversation. Cranial Nerves: II: Visual fields grossly normal, pupils equal, round, reactive to light and accommodation III,IV, VI: ptosis not present, extra-ocular motions intact bilaterally V,VII: smile symmetric, facial light touch sensation normal bilaterally VIII: hearing normal bilaterally IX,X: gag reflex present XI: bilateral shoulder shrug XII: midline tongue extension Motor: RUE in sling.  Moves all other extremities against gravity spontaneously Sensory: Pinprick and light touch intact throughout, bilaterally Deep Tendon Reflexes: Symmetric throughout Plantars: Right: mute   Left: upgoing Cerebellar: Unable to perform due to confusion Gait: not tested due to safety concerns    Laboratory Studies:   Basic Metabolic Panel: Recent Labs  Lab 12/19/18 1316 12/19/18 1750 12/20/18 0424 12/20/18 1244  NA 142  --  146* 147*  K 4.4  --  4.0 3.9  CL 109  --  115* 115*  CO2 18*  --  19* 19*  GLUCOSE 88  --  72 75  BUN QUANTITY NOT SUFFICIENT, UNABLE TO PERFORM TEST 57* 45* 36*  CREATININE QUANTITY NOT SUFFICIENT, UNABLE TO PERFORM TEST 1.32* 0.92 0.74  CALCIUM 9.4  --  8.8* 8.9    Liver Function Tests: Recent Labs  Lab 12/19/18 1316 12/20/18 0424  AST 28 21  ALT 17 13  ALKPHOS 82 76  BILITOT 1.2 1.2  PROT 7.5 6.7  ALBUMIN 3.8 3.4*   No results for input(s): LIPASE, AMYLASE in the last 168 hours. Recent Labs  Lab 12/20/18 1244  AMMONIA 27    CBC: Recent Labs  Lab 12/19/18 1316 12/20/18 0424  WBC 8.7 6.8  HGB 8.9* 8.2*  HCT 28.5* 28.1*  MCV 82.4 87.8  PLT 300 337    Cardiac Enzymes: No results for input(s): CKTOTAL, CKMB, CKMBINDEX, TROPONINI in the last 168 hours.  BNP: Invalid input(s): POCBNP  CBG: No results for  input(s): GLUCAP in the last 168 hours.  Microbiology: Results for orders placed or performed during the hospital encounter of 12/19/18  Urine culture     Status: None   Collection Time: 12/19/18  5:39 PM   Specimen: Urine,  Catheterized  Result Value Ref Range Status   Specimen Description   Final    URINE, CATHETERIZED Performed at [facility], [address], [place], [place] [address]    Special Requests   Final    NONE Performed at [facility], [address], [place], [place] [address]    Culture   Final    NO GROWTH Performed at [facility], [address], [place], [place] [address]    Report Status 12/21/2018 FINAL  Final  SARS CORONAVIRUS 2 (TAT 6-24 HRS) Nasopharyngeal Nasopharyngeal Swab     Status: Abnormal   Collection Time: 12/19/18 10:54 PM   Specimen: Nasopharyngeal Swab  Result Value Ref Range Status   SARS Coronavirus 2 POSITIVE (A) NEGATIVE Final    Comment: RESULT CALLED TO, READ BACK BY AND VERIFIED WITH: JADIE COHEN RN.@[email or web address]  ON 12.19.2020 BY TCALDWELL MT. (NOTE) SARS-CoV-2 target nucleic acids are DETECTED. The SARS-CoV-2 RNA is generally detectable in upper and lower respiratory specimens during the acute phase of infection. Positive results are indicative of the presence of SARS-CoV-2 RNA. Clinical correlation with patient history and other diagnostic information is  necessary to determine patient infection status. Positive results do not rule out bacterial infection or co-infection with other viruses.  The expected result is Negative. Fact Sheet for Patients: HairSlick.no Fact Sheet for Healthcare Providers: quierodirigir.com This test is not yet approved or cleared by the [place] FDA and  has been authorized for detection and/or diagnosis of SARS-CoV-2 by FDA under an Emergency Use Authorization (EUA). This EUA will remain  in effect (meaning this test can  be  used) for the duration of the COVID-19 declaration under Section 564(b)(1) of the Act, 21 U.S.C. section 360bbb-3(b)(1), unless the authorization is terminated or revoked sooner. Performed at [facility], [address] [place], [place] [address]   MRSA PCR Screening     Status: None   Collection Time: 12/20/18  1:27 AM   Specimen: Nasopharyngeal  Result Value Ref Range Status   MRSA by PCR NEGATIVE NEGATIVE Final    Comment:        The GeneXpert MRSA Assay (FDA approved for NASAL specimens only), is one component of a comprehensive MRSA colonization surveillance program. It is not intended to diagnose MRSA infection nor to guide or monitor treatment for MRSA infections. Performed at [facility], [address] [place], [place] [address]     Coagulation Studies: No results for input(s): LABPROT, INR in the last 72 hours.  Urinalysis:  Recent Labs  Lab 12/19/18 1739  COLORURINE YELLOW*  LABSPEC 1.017  PHURINE 5.0  GLUCOSEU NEGATIVE  HGBUR NEGATIVE  BILIRUBINUR NEGATIVE  KETONESUR 5*  PROTEINUR NEGATIVE  NITRITE NEGATIVE  LEUKOCYTESUR NEGATIVE    Lipid Panel:  No results found for: CHOL, TRIG, HDL, CHOLHDL, VLDL, LDLCALC  HgbA1C:  Lab Results  Component Value Date   HGBA1C 5.0 02/06/2017    Urine Drug Screen:  No results found for: LABOPIA, COCAINSCRNUR, LABBENZ, AMPHETMU, THCU, LABBARB  Alcohol Level: No results for input(s): ETH in the last 168 hours.  Other results: EKG: normal sinus rhythm at 88 bpm, LBBB.  Imaging: CT Head Wo Contrast  Result Date: 12/19/2018 CLINICAL DATA:  Altered mental status. EXAM: CT HEAD WITHOUT CONTRAST TECHNIQUE: Contiguous axial images were obtained from the base of the skull through the vertex without intravenous contrast. COMPARISON:  December 09, 2018. FINDINGS: Brain: Mild diffuse cortical atrophy is noted. Mild chronic ischemic white  matter disease is noted. No mass effect or midline shift is noted.  Ventricular size is within normal limits. There is no evidence of mass lesion, hemorrhage or acute infarction. Vascular: No hyperdense vessel or unexpected calcification. Skull: Normal. Negative for fracture or focal lesion. Sinuses/Orbits: No acute finding. Other: None. IMPRESSION: Mild diffuse cortical atrophy. Mild chronic ischemic white matter disease. No acute intracranial abnormality seen. Electronically Signed   By: Lupita Raider M.D.   On: 12/19/2018 17:03   MR BRAIN WO CONTRAST  Result Date: 12/20/2018 CLINICAL DATA:  Encephalopathy. EXAM: MRI HEAD WITHOUT CONTRAST TECHNIQUE: Multiplanar, multiecho pulse sequences of the brain and surrounding structures were obtained without intravenous contrast. COMPARISON:  Head CT 12/19/2018 and MRI 11/12/2014 FINDINGS: Brain: There is a 3 mm focus of hyperintense trace diffusion weighted signal in the subcortical white matter of the right parietal lobe with suggestion of subtly reduced ADC. No acute infarct is evident elsewhere. No intracranial hemorrhage, mass, midline shift, or extra-axial fluid collection is identified. Periventricular white matter T2 hyperintensities are unchanged from the prior MRI and nonspecific but compatible with chronic small vessel ischemia, minimal for age. Punctate chronic infarcts are suspected in the cerebellum bilaterally, not clearly present on the prior MRI. Generalized cerebral atrophy has mildly progressed from the prior MRI. Vascular: Major intracranial vascular flow voids are preserved. Skull and upper cervical spine: Unremarkable bone marrow signal. Sinuses/Orbits: Bilateral cataract extraction. Clear paranasal sinuses. Trace bilateral mastoid fluid. Other: None. IMPRESSION: 1. Suspected punctate acute/early subacute right parietal infarct. 2. Minimal chronic small vessel ischemic disease with punctate chronic cerebellar infarcts. Electronically Signed   By: Sebastian Ache M.D.   On: 12/20/2018 19:22   DG Chest Port 1  View  Result Date: 12/19/2018 CLINICAL DATA:  Altered mental status. COVID. Recent RIGHT humeral fracture. EXAM: PORTABLE CHEST 1 VIEW COMPARISON:  12/09/2018 FINDINGS: The patient is rotated and leaning towards the RIGHT and this is a mildly low volume study. Mild RIGHT mid and lower lung opacities have slightly improved. Improved aeration in the LEFT lung base noted. No definite pleural effusion or pneumothorax. The cardiomediastinal silhouette is unchanged given technique. Aortic valve replacement again noted. Proximal RIGHT humeral fracture again noted. IMPRESSION: Improved RIGHT lung opacities and improved LEFT basilar aeration without other significant change. Electronically Signed   By: Harmon Pier M.D.   On: 12/19/2018 19:12     Assessment/Plan: 85 year old female with multiple medical problems including dementia admitted with altered mental status and frequent falls.  From review of record patient has had multiple encounters this year with altered mental status.  On Namenda and Zyprexa prior to admission.  With this admission patient with AKI and dehydration.  Has had recent COVID infection.  MRI of the brain reviewed and there is a small acute right parietal infarct noted.  Likely embolic with history of atrial fibrillation on Eliquis.  Suspect mental status is multifactorial and related to stroke, AKI, dehydration, recent COVID and hospitalization superimposed on an underlying dementia.  TSH, B12, ammonia are unremarkable.  B1 pending. Due to underlying dementia although lab work may return to normal, cognition will lag behind improvement in the medical issues.  Recommendations: 1. HgbA1c, fasting lipid panel 2. PT consult, OT consult, Speech consult 3. Echocardiogram pending 4. Carotid dopplers pending 5. Prophylactic therapy-Continue Eliquis 6. NPO until RN stroke swallow screen 7. Telemetry monitoring 8. Frequent neuro checks 9. Agree with continuing to address medical issues 10.  EEG   Thana Farr, MD Neurology  [phone] 12/21/2018, 4:07 PM

## 2018-12-21 NOTE — Progress Notes (Signed)
Remdesivir - Pharmacy Brief Note   O:  ALT: 13 CXR 12/18: Improved RIGHT lung opacities and improved LEFT basilar aeration without other significant change.   A/P:  Remdesivir 200 mg IVPB once followed by 100 mg IVPB daily x 4 days.   .me 12/21/2018 1:44 PM

## 2018-12-21 NOTE — Progress Notes (Signed)
Nurse concern with giving eliquis as she is NPO. Based on most recent progress note asked her to hold med until status on continuing is finalized

## 2018-12-21 NOTE — Progress Notes (Signed)
PT Cancellation Note  Patient Details Name: Kelsey Le MRN: [id number] DOB: 1930/03/08   Cancelled Treatment:    Reason Eval/Treat Not Completed: Patient's level of consciousness: per nurse patient continues to not be appropriate for PT Azalia Bilis, PT DPT 12/21/2018, 12:14 PM

## 2018-12-22 ENCOUNTER — Inpatient Hospital Stay (HOSPITAL_COMMUNITY)
Admit: 2018-12-22 | Discharge: 2018-12-22 | Disposition: A | Discharge status: Home / Self Care | Payer: Medicare Other | Attending: Internal Medicine | Admitting: Internal Medicine

## 2018-12-22 DIAGNOSIS — I6389 Other cerebral infarction: Secondary | ICD-10-CM

## 2018-12-22 DIAGNOSIS — U071 COVID-19: Secondary | ICD-10-CM

## 2018-12-22 LAB — COMPREHENSIVE METABOLIC PANEL
ALT: 12 U/L (ref 0–44)
AST: 21 U/L (ref 15–41)
Albumin: 3 g/dL — ABNORMAL LOW (ref 3.5–5.0)
Alkaline Phosphatase: 76 U/L (ref 38–126)
Anion gap: 9 (ref 5–15)
BUN: 17 mg/dL (ref 8–23)
CO2: 24 mmol/L (ref 22–32)
Calcium: 8.9 mg/dL (ref 8.9–10.3)
Chloride: 115 mmol/L — ABNORMAL HIGH (ref 98–111)
Creatinine, Ser: 0.67 mg/dL (ref 0.44–1.00)
GFR calc Af Amer: >60 mL/min (ref >60)
GFR calc non Af Amer: >60 mL/min (ref >60)
Glucose, Bld: 100 mg/dL — ABNORMAL HIGH (ref 70–99)
Potassium: 3.2 mmol/L — ABNORMAL LOW (ref 3.5–5.1)
Sodium: 148 mmol/L — ABNORMAL HIGH (ref 135–145)
Total Bilirubin: 1.3 mg/dL — ABNORMAL HIGH (ref 0.3–1.2)
Total Protein: 6.1 g/dL — ABNORMAL LOW (ref 6.5–8.1)

## 2018-12-22 LAB — CBC WITH DIFFERENTIAL/PLATELET
Abs Immature Granulocytes: 0.04 10*3/uL (ref 0.00–0.07)
Basophils Absolute: 0 10*3/uL (ref 0.0–0.1)
Basophils Relative: 1 %
Eosinophils Absolute: 0.1 10*3/uL (ref 0.0–0.5)
Eosinophils Relative: 1 %
HCT: 27.1 % — ABNORMAL LOW (ref 36.0–46.0)
Hemoglobin: 8 g/dL — ABNORMAL LOW (ref 12.0–15.0)
Immature Granulocytes: 1 %
Lymphocytes Relative: 14 %
Lymphs Abs: 0.9 10*3/uL (ref 0.7–4.0)
MCH: 25.8 pg — ABNORMAL LOW (ref 26.0–34.0)
MCHC: 29.5 g/dL — ABNORMAL LOW (ref 30.0–36.0)
MCV: 87.4 fL (ref 80.0–100.0)
Monocytes Absolute: 0.7 10*3/uL (ref 0.1–1.0)
Monocytes Relative: 11 %
Neutro Abs: 4.9 10*3/uL (ref 1.7–7.7)
Neutrophils Relative %: 72 %
Platelets: 255 10*3/uL (ref 150–400)
RBC: 3.1 MIL/uL — ABNORMAL LOW (ref 3.87–5.11)
RDW: 22 % — ABNORMAL HIGH (ref 11.5–15.5)
WBC: 6.6 10*3/uL (ref 4.0–10.5)
nRBC: 0 % (ref 0.0–0.2)

## 2018-12-22 LAB — LIPID PANEL
Cholesterol: 191 mg/dL (ref 0–200)
HDL: 39 mg/dL — ABNORMAL LOW (ref >40)
LDL Cholesterol: 132 mg/dL — ABNORMAL HIGH (ref 0–99)
Total CHOL/HDL Ratio: 4.9 RATIO
Triglycerides: 99 mg/dL (ref <150)
VLDL: 20 mg/dL (ref 0–40)

## 2018-12-22 LAB — ECHOCARDIOGRAM COMPLETE
Height: 63 in
Weight: 1846.4 oz

## 2018-12-22 LAB — MAGNESIUM: Magnesium: 1.8 mg/dL (ref 1.7–2.4)

## 2018-12-22 MED ORDER — POTASSIUM CHLORIDE 2 MEQ/ML IV SOLN
INTRAVENOUS | Status: DC
  Filled 2018-12-22 (×7): qty 1000

## 2018-12-22 MED ORDER — ENOXAPARIN SODIUM 40 MG/0.4ML ~~LOC~~ SOLN
40.0000 mg | SUBCUTANEOUS | Status: DC
  Administered 2018-12-22 – 2018-12-24 (×3): 40 mg via SUBCUTANEOUS
  Filled 2018-12-22 (×3): qty 0.4

## 2018-12-22 MED ORDER — HEPARIN SODIUM (PORCINE) 5000 UNIT/ML IJ SOLN
5000.0000 [IU] | Freq: Three times a day (TID) | INTRAMUSCULAR | Status: DC
  Filled 2018-12-22: qty 1

## 2018-12-22 MED ORDER — POTASSIUM CHLORIDE 10 MEQ/100ML IV SOLN
10.0000 meq | INTRAVENOUS | Status: AC
  Administered 2018-12-22: 10:00:00 10 meq via INTRAVENOUS
  Filled 2018-12-22 (×2): qty 100

## 2018-12-22 MED ORDER — POTASSIUM CHLORIDE 10 MEQ/100ML IV SOLN
10.0000 meq | INTRAVENOUS | Status: AC
  Administered 2018-12-22 (×3): 10 meq via INTRAVENOUS
  Filled 2018-12-22 (×2): qty 100

## 2018-12-22 NOTE — TOC Progression Note (Signed)
Transition of Care [facility]) - Progression Note    Patient Details  Name: Kelsey Le MRN: [id number] Date of Birth: 1930/06/05  Transition of [facility]) CM/SW Contact  Su Hilt, RN Phone Number: 12/22/2018, 10:12 AM  Clinical Narrative:    spoke to the patient's Daughter Juliann Pulse about the plan at DC, the plan is for the patient to return to [facility] if she is able.  She stated that [facility] does have 1 unit available in the memory care side however the patient has been in the regular assisted living side, She is open to possible Palliative care if that is recommended. we will continue to monitor for needs         Expected Discharge Plan and Services                                                 Social Determinants of Health (SDOH) Interventions    Readmission Risk Interventions No flowsheet data found.

## 2018-12-22 NOTE — Progress Notes (Signed)
eeg completed ° °

## 2018-12-22 NOTE — Progress Notes (Signed)
*  PRELIMINARY RESULTS* Echocardiogram 2D Echocardiogram has been performed.  Kelsey Le 12/22/2018, 9:47 AM

## 2018-12-22 NOTE — Progress Notes (Signed)
SLP Cancellation Note  Patient Details Name: Kelsey Le MRN: [id number] DOB: 1930-04-24   Cancelled treatment:         Chart reviewed. Pt visited. Daughter present and supportive. Verbal and tactile cues were not successful in awakening Pt. Pt is currently NPO, family reported to [place] that they have discussed and decided on no feeding tubes. Will continue to follow up in hopes of increased alertness for swallowing assessment. Continue NPO.   Lucila Maine 12/22/2018, 11:05 AM

## 2018-12-22 NOTE — Progress Notes (Signed)
PT Cancellation Note  Patient Details Name: Kelsey Le MRN: [id number] DOB: 1930/09/26   Cancelled Treatment:    Reason Eval/Treat Not Completed: Patient's level of consciousness(Chart reviewed for re-attempt at evaluation.  Per primary RN, patient remains lethargic; unable to follow commands or participate with session (x3 consecutive days at this time).  Will complete order at this time due to inability to participate; please re-consult as medically appropriate and able to participate.)  Jolan Upchurch H. Owens Shark, PT, DPT, NCS 12/22/18, 2:06 PM [phone]

## 2018-12-22 NOTE — Progress Notes (Signed)
Subjective: Sleepy today, harder to arouse. S/p discussion with family at bedside.   Past Medical History:  Diagnosis Date  . Anxiety   . Asthma   . AVM (arteriovenous malformation)    a. small intestine with recurrent GI bleeding.   . B12 deficiency   . Chronic diastolic CHF (congestive heart failure) ([place])   . Hip fracture ([place])    a. in the setting of mechanical fall  . Iron deficiency   . Iron deficiency anemia    a. requiring periodic transfusions  . Psychosis ([place])   . S/P TAVR (transcatheter aortic valve replacement)    a. 02/12/17: s/p TAVR w/ an Edwards Sapien 3 THV (size 23 mm, model # 9600TFX, serial # [id number])  . Severe aortic stenosis   . Syncope and collapse   . Thyroid disease     Past Surgical History:  Procedure Laterality Date  . APPENDECTOMY    . BACK SURGERY     back fusion  . CARDIAC CATHETERIZATION    . ESOPHAGOGASTRODUODENOSCOPY (EGD) WITH PROPOFOL N/A 11/15/2015   Procedure: ESOPHAGOGASTRODUODENOSCOPY (EGD) WITH PROPOFOL;  Surgeon: Lucilla Lame, MD;  Location: [facility] ENDOSCOPY;  Service: Endoscopy;  Laterality: N/A;  . ESOPHAGOGASTRODUODENOSCOPY (EGD) WITH PROPOFOL N/A 10/12/2016   Procedure: ESOPHAGOGASTRODUODENOSCOPY (EGD) WITH PROPOFOL;  Surgeon: Lucilla Lame, MD;  Location: [facility] ENDOSCOPY;  Service: Endoscopy;  Laterality: N/A;  . FEMUR SURGERY     broken with rod  . FRACTURE SURGERY    . GIVENS CAPSULE STUDY N/A 02/20/2016   Procedure: GIVENS CAPSULE STUDY;  Surgeon: Manya Silvas, MD;  Location: [facility] ENDOSCOPY;  Service: Endoscopy;  Laterality: N/A;  . HIP FRACTURE SURGERY    . INTRAMEDULLARY (IM) NAIL INTERTROCHANTERIC Right 11/18/2015   Procedure: INTRAMEDULLARY (IM) NAIL INTERTROCHANTRIC;  Surgeon: Thornton Park, MD;  Location: [facility] ORS;  Service: Orthopedics;  Laterality: Right;  . KYPHOPLASTY N/A 08/19/2014   Procedure: KYPHOPLASTY;  Surgeon: Hessie Knows, MD;  Location: [facility] ORS;  Service: Orthopedics;  Laterality: N/A;  . PARTIAL  HYSTERECTOMY    . RIGHT/LEFT HEART CATH AND CORONARY ANGIOGRAPHY N/A 10/05/2016   Procedure: RIGHT/LEFT HEART CATH AND CORONARY ANGIOGRAPHY;  Surgeon: Burnell Blanks, MD;  Location: [place] [facility];  Service: Cardiovascular;  Laterality: N/A;  . TEE WITHOUT CARDIOVERSION N/A 02/12/2017   Procedure: TRANSESOPHAGEAL ECHOCARDIOGRAM (TEE);  Surgeon: Burnell Blanks, MD;  Location: [place];  Service: Open Heart Surgery;  Laterality: N/A;  . TRANSCATHETER AORTIC VALVE REPLACEMENT, TRANSFEMORAL N/A 02/12/2017   Procedure: TRANSCATHETER AORTIC VALVE REPLACEMENT, TRANSFEMORAL using a 52mm Edwards Sapien 3 Aortic Valve;  Surgeon: Burnell Blanks, MD;  Location: [place];  Service: Open Heart Surgery;  Laterality: N/A;  . TUBAL LIGATION      Family History  Problem Relation Age of Onset  . Heart block Mother   . Heart failure Father   . Heart disease Sister   . Breast cancer Maternal Aunt     Social History:  reports that she has never smoked. She has never used smokeless tobacco. She reports that she does not drink alcohol or use drugs.  Allergies  Allergen Reactions  . Aspirin Other (See Comments)    GI bleeding   . Penicillins Rash and Other (See Comments)    PATIENT HAS HAD A PCN REACTION WITH IMMEDIATE RASH, FACIAL/TONGUE/THROAT SWELLING, SOB, OR LIGHTHEADEDNESS WITH HYPOTENSION:  #  #  #  YES  #  #  #   Has patient had a PCN reaction causing severe rash involving  mucus membranes or skin necrosis: No Has patient had a PCN reaction that required hospitalization: No Has patient had a PCN reaction occurring within the last 10 years: No     Medications:  I have reviewed the patient's current medications. Prior to Admission:  Medications Prior to Admission  Medication Sig Dispense Refill Last Dose  . acetaminophen (TYLENOL) 325 MG tablet Take 650 mg by mouth every 6 (six) hours as needed.   prn at prn  . Acetaminophen 500 MG coapsule Take 500 mg by mouth 3 (three) times  daily.    12/19/2018 at 0600  . apixaban (ELIQUIS) 2.5 MG TABS tablet Take 1 tablet (2.5 mg total) by mouth 2 (two) times daily. 180 tablet 2 12/19/2018 at 0600  . Calcium Carbonate-Vitamin D (CALCIUM-VITAMIN D) 600-125 MG-UNIT TABS Take by mouth daily.   12/19/2018 at 0600  . Cholecalciferol (VITAMIN D-1000 MAX ST) 25 MCG (1000 UT) tablet TAKE (1) TABLET BY MOUTH ONCE A DAY   12/19/2018 at 0600  . clobetasol cream (TEMOVATE) 0.05 % Apply 1 application topically See admin instructions. Apply small amount vaginally on Monday and Friday   12/18/2018 at 2000  . estradiol (ESTRACE) 0.1 MG/GM vaginal cream Place 1 Applicatorful vaginally See admin instructions. Apply 0.5 grams vaginally Tuesday and Thursday morning   Past Week at 2000  . furosemide (LASIX) 20 MG tablet Take 1 tablet (20 mg total) by mouth daily. 5 tablet 0 12/19/2018 at 0600  . iron polysaccharides (NIFEREX) 150 MG capsule Take 150 mg by mouth daily.   12/19/2018 at 0600  . levothyroxine (SYNTHROID, LEVOTHROID) 50 MCG tablet Take 50 mcg by mouth daily before breakfast.    12/19/2018 at 0600  . loperamide (IMODIUM) 2 MG capsule Take 2 mg by mouth 2 (two) times daily.   12/19/2018 at 0600  . loratadine (CLARITIN) 10 MG tablet Take 10 mg by mouth daily.    12/19/2018 at 0600  . memantine (NAMENDA) 10 MG tablet Take 10 mg by mouth 2 (two) times daily.   0 12/19/2018 at 0600  . nitrofurantoin (MACRODANTIN) 50 MG capsule Take 1 capsule (50 mg total) by mouth at bedtime. 30 capsule 2 12/18/2018 at 2000  . nystatin (NYSTATIN) powder Apply 1 application topically 2 (two) times daily as needed.   prn at prn  . OLANZapine (ZYPREXA) 10 MG tablet Take 10 mg by mouth at bedtime.   12/18/2018 at 2000  . omeprazole (PRILOSEC) 20 MG capsule Take 1 capsule (20 mg total) by mouth daily. 90 capsule 3 12/19/2018 at 0600  . polyvinyl alcohol (LIQUIFILM TEARS) 1.4 % ophthalmic solution Place 1 drop into both eyes as needed for dry eyes.   prn at prn  .  sennosides-docusate sodium (SENOKOT-S) 8.6-50 MG tablet Take 1 tablet by mouth daily.   12/19/2018 at 0600  . sertraline (ZOLOFT) 50 MG tablet Take 1 tablet (50 mg total) by mouth daily. 30 tablet 2 12/19/2018 at 0600  . vitamin B-12 (CYANOCOBALAMIN) 100 MCG tablet Take 1,000 mcg by mouth daily.    12/19/2018 at 0600  . clindamycin (CLEOCIN) 150 MG capsule Take 150 mg by mouth 3 (three) times daily. As needed for dental procedures   prn at prn  . sulfamethoxazole-trimethoprim (BACTRIM DS) 800-160 MG tablet Take 1 tablet by mouth 2 (two) times daily.   Completed Course at Unknown time   Scheduled: . vitamin C  500 mg Oral Daily  . heparin injection (subcutaneous)  5,000 Units Subcutaneous Q8H  . levothyroxine  25 mcg Intravenous Daily  . thiamine  100 mg Oral Daily    ROS: Unable to provide due to mental status  Physical Examination: Blood pressure (!) 169/60, pulse 97, temperature 98.4 F (36.9 C), temperature source Axillary, resp. rate 20, height 5\' 3"  (1.6 m), weight 52.3 kg, SpO2 97 %.   Neurological Examination   Mental Status: Alert, perseverates.  Not oriented.  Follows some simple commands.  Speech fluent but often not related to conversation. Cranial Nerves: II: Visual fields grossly normal, pupils equal, round, reactive to light and accommodation III,IV, VI: ptosis not present, extra-ocular motions intact bilaterally V,VII: smile symmetric, facial light touch sensation normal bilaterally VIII: hearing normal bilaterally IX,X: gag reflex present XI: bilateral shoulder shrug XII: midline tongue extension Motor: RUE in sling.  Moves all other extremities against gravity spontaneously Sensory: Pinprick and light touch intact throughout, bilaterally Deep Tendon Reflexes: Symmetric throughout Plantars: Right: mute   Left: upgoing Cerebellar: Unable to perform due to confusion Gait: not tested due to safety concerns    Laboratory Studies:   Basic Metabolic  Panel: Recent Labs  Lab 12/19/18 1316 12/19/18 1750 12/20/18 0424 12/20/18 1244 12/22/18 0429  NA 142  --  146* 147* 148*  K 4.4  --  4.0 3.9 3.2*  CL 109  --  115* 115* 115*  CO2 18*  --  19* 19* 24  GLUCOSE 88  --  72 75 100*  BUN QUANTITY NOT SUFFICIENT, UNABLE TO PERFORM TEST 57* 45* 36* 17  CREATININE QUANTITY NOT SUFFICIENT, UNABLE TO PERFORM TEST 1.32* 0.92 0.74 0.67  CALCIUM 9.4  --  8.8* 8.9 8.9  MG  --   --   --   --  1.8    Liver Function Tests: Recent Labs  Lab 12/19/18 1316 12/20/18 0424 12/22/18 0429  AST 28 21 21   ALT 17 13 12   ALKPHOS 82 76 76  BILITOT 1.2 1.2 1.3*  PROT 7.5 6.7 6.1*  ALBUMIN 3.8 3.4* 3.0*   No results for input(s): LIPASE, AMYLASE in the last 168 hours. Recent Labs  Lab 12/20/18 1244  AMMONIA 27    CBC: Recent Labs  Lab 12/19/18 1316 12/20/18 0424 12/22/18 0429  WBC 8.7 6.8 6.6  NEUTROABS  --   --  4.9  HGB 8.9* 8.2* 8.0*  HCT 28.5* 28.1* 27.1*  MCV 82.4 87.8 87.4  PLT 300 337 255    Cardiac Enzymes: No results for input(s): CKTOTAL, CKMB, CKMBINDEX, TROPONINI in the last 168 hours.  BNP: Invalid input(s): POCBNP  CBG: No results for input(s): GLUCAP in the last 168 hours.  Microbiology: Results for orders placed or performed during the hospital encounter of 12/19/18  Urine culture     Status: None   Collection Time: 12/19/18  5:39 PM   Specimen: Urine, Catheterized  Result Value Ref Range Status   Specimen Description   Final    URINE, CATHETERIZED Performed at [facility], [address] [place], [place] [address]    Special Requests   Final    NONE Performed at [facility], [address] [place], [place] [address]    Culture   Final    NO GROWTH Performed at [facility], [address] [place], [place] [address]    Report Status 12/21/2018 FINAL  Final  SARS CORONAVIRUS 2 (TAT 6-24 HRS) Nasopharyngeal Nasopharyngeal Swab     Status: Abnormal   Collection Time:  12/19/18 10:54 PM   Specimen: Nasopharyngeal Swab  Result Value Ref Range Status   SARS Coronavirus 2 POSITIVE (A) NEGATIVE Final    Comment: RESULT CALLED TO, READ BACK BY AND VERIFIED WITH: JADIE COHEN [email or web address].@[email or web address]  ON 12.19.2020 BY TCALDWELL MT. (NOTE) SARS-CoV-2 target nucleic acids are DETECTED. The SARS-CoV-2 RNA is generally detectable in upper and lower respiratory specimens during the acute phase of infection. Positive results are indicative of the presence of SARS-CoV-2 RNA. Clinical correlation with patient history and other diagnostic information is  necessary to determine patient infection status. Positive results do not rule out bacterial infection or co-infection with other viruses.  The expected result is Negative. Fact Sheet for Patients: HairSlick.no Fact Sheet for Healthcare Providers: quierodirigir.com This test is not yet approved or cleared by the [place] FDA and  has been authorized for detection and/or diagnosis of SARS-CoV-2 by FDA under an Emergency Use Authorization (EUA). This EUA will remain  in effect (meaning this test can  be used) for the duration of the COVID-19 declaration under Section 564(b)(1) of the Act, 21 U.S.C. section 360bbb-3(b)(1), unless the authorization is terminated or revoked sooner. Performed at [facility], [address], [place], [place] [address]   MRSA PCR Screening     Status: None   Collection Time: 12/20/18  1:27 AM   Specimen: Nasopharyngeal  Result Value Ref Range Status   MRSA by PCR NEGATIVE NEGATIVE Final    Comment:        The GeneXpert MRSA Assay (FDA approved for NASAL specimens only), is one component of a comprehensive MRSA colonization surveillance program. It is not intended to diagnose MRSA infection nor to guide or monitor treatment for MRSA infections. Performed at [facility], [address] [place], [place] [address]      Coagulation Studies: No results for input(s): LABPROT, INR in the last 72 hours.  Urinalysis:  Recent Labs  Lab 12/19/18 1739  COLORURINE YELLOW*  LABSPEC 1.017  PHURINE 5.0  GLUCOSEU NEGATIVE  HGBUR NEGATIVE  BILIRUBINUR NEGATIVE  KETONESUR 5*  PROTEINUR NEGATIVE  NITRITE NEGATIVE  LEUKOCYTESUR NEGATIVE    Lipid Panel:     Component Value Date/Time   CHOL 191 12/22/2018 0429   TRIG 99 12/22/2018 0429   HDL 39 (L) 12/22/2018 0429   CHOLHDL 4.9 12/22/2018 0429   VLDL 20 12/22/2018 0429   LDLCALC 132 (H) 12/22/2018 0429    HgbA1C:  Lab Results  Component Value Date   HGBA1C 5.0 02/06/2017    Urine Drug Screen:  No results found for: LABOPIA, COCAINSCRNUR, LABBENZ, AMPHETMU, THCU, LABBARB  Alcohol Level: No results for input(s): ETH in the last 168 hours.  Other results: EKG: normal sinus rhythm at 88 bpm, LBBB.  Imaging: MR BRAIN WO CONTRAST  Result Date: 12/20/2018 CLINICAL DATA:  Encephalopathy. EXAM: MRI HEAD WITHOUT CONTRAST TECHNIQUE: Multiplanar, multiecho pulse sequences of the brain and surrounding structures were obtained without intravenous contrast. COMPARISON:  Head CT 12/19/2018 and MRI 11/12/2014 FINDINGS: Brain: There is a 3 mm focus of hyperintense trace diffusion weighted signal in the subcortical white matter of the right parietal lobe with suggestion of subtly reduced ADC. No acute infarct is evident elsewhere. No intracranial hemorrhage, mass, midline shift, or extra-axial fluid collection is identified. Periventricular white matter T2 hyperintensities are unchanged from the prior MRI and nonspecific but compatible with chronic small vessel ischemia, minimal for age. Punctate chronic infarcts are suspected in the cerebellum bilaterally, not clearly present on the prior MRI. Generalized cerebral atrophy has mildly progressed from the  prior MRI. Vascular: Major intracranial vascular flow voids are preserved. Skull and upper cervical spine:  Unremarkable bone marrow signal. Sinuses/Orbits: Bilateral cataract extraction. Clear paranasal sinuses. Trace bilateral mastoid fluid. Other: None. IMPRESSION: 1. Suspected punctate acute/early subacute right parietal infarct. 2. Minimal chronic small vessel ischemic disease with punctate chronic cerebellar infarcts. Electronically Signed   By: Sebastian Ache M.D.   On: 12/20/2018 19:22   US Carotid Bilateral  Result Date: 12/22/2018 CLINICAL DATA:  Cerebral infarct. EXAM: BILATERAL CAROTID DUPLEX ULTRASOUND TECHNIQUE: Wallace Cullens scale imaging, color Doppler and duplex ultrasound were performed of bilateral carotid and vertebral arteries in the neck. COMPARISON:  None. FINDINGS: Criteria: Quantification of carotid stenosis is based on velocity parameters that correlate the residual internal carotid diameter with NASCET-based stenosis levels, using the diameter of the distal internal carotid lumen as the denominator for stenosis measurement. The following velocity measurements were obtained: RIGHT ICA:  58/5 cm/sec CCA:  94/12 cm/sec SYSTOLIC ICA/CCA RATIO:  0.6 ECA:  59 cm/sec LEFT ICA:  68/13 cm/sec CCA:  55/11 cm/sec SYSTOLIC ICA/CCA RATIO:  1.4 ECA:  61 cm/sec RIGHT CAROTID ARTERY: Intimal thickening in the common carotid artery and carotid bulb. No focal plaque identified. No evidence of right ICA stenosis. RIGHT VERTEBRAL ARTERY: Antegrade flow with normal waveform and velocity. LEFT CAROTID ARTERY: Mild plaque at the level of the left carotid bulb and left ICA origin. No significant stenosis identified with estimated left ICA stenosis of less than 50%. LEFT VERTEBRAL ARTERY: Antegrade flow with normal waveform and velocity. IMPRESSION: Mild plaque at the level of the left carotid bulb and left ICA origin. Estimated left ICA stenosis is less than 50%. Electronically Signed   By: Irish Lack M.D.   On: 12/22/2018 07:50     Assessment/Plan: 85 year old female with multiple medical problems including  dementia admitted with altered mental status and frequent falls.  From review of record patient has had multiple encounters this year with altered mental status.  On Namenda and Zyprexa prior to admission.  With this admission patient with AKI and dehydration.  Has had recent COVID infection.  MRI of the brain reviewed and there is a small acute right parietal infarct noted.  Likely embolic with history of atrial fibrillation on Eliquis.  Suspect mental status is multifactorial and related to stroke, AKI, dehydration, recent COVID and hospitalization superimposed on an underlying dementia.  TSH, B12, ammonia are unremarkable.   - As above multifactorial reason for mental status along with progressive vascular dementia in the setting of ischemia - Pt also likely has hypoactive delirium that is contributing to worsening mentation  - Lights turned on, stimulation during the day - Hydration and AKI treatment as per primary team - Continue Eliquis - EEG today

## 2018-12-22 NOTE — Procedures (Signed)
Patient Name: Kelsey Le  MRN: [id number]  Epilepsy Attending: Lora Havens  Referring Physician/Provider: Dr Alexis Goodell Date: 12/22/2018 Duration: 25.39 mins  Patient history: 85yo F with COVID+ and ams. EEG to evaluate for seizure  Level of alertness: awake  AEDs during EEG study: None  Technical aspects: This EEG study was done with scalp electrodes positioned according to the 10-20 International system of electrode placement. Electrical activity was acquired at a sampling rate of 500Hz  and reviewed with a high frequency filter of 70Hz  and a low frequency filter of 1Hz . EEG data were recorded continuously and digitally stored.   DESCRIPTION: During awake state, no clear posterior dominant rhythm was seen. Continuous slow 3-6Hz  theta-delta slowing was seen. Triphasic waves, generalized, maximal bifrontal were seen at times. Photic driving was not seen during photic stimulation. Hyperventilation was not performed   ABNORMALITY - Continuous slow, generalized - Triphasic waves, generalized, maximal bifrontal  IMPRESSION: This study is suggestive of moderate to severe diffuse encephalopathy, non specific to etiology but could be secondary to toxic-metabolic causes. No seizures or epileptiform discharges were seen throughout the recording.  Larron Armor Barbra Sarks

## 2018-12-22 NOTE — Progress Notes (Addendum)
PROGRESS NOTE    Kelsey Le  OZD:[id number] DOB: 27-Dec-1930 DOA: 12/19/2018  PCP: Marguarite ArbourSparks, Jeffrey D, MD    LOS - 3   Brief Narrative:  85 y.o. female, ALF resident, with medical history significant of diastolic CHF, pulmonary embolism on chronic anticoagulation, dementia, recurrent AVMs, asthma, iron deficiency anemia, recurrent UTI's, who has had recent recurrent falls at the facility including right humeral shaft fracture currently in immobilizer.  Brought to ED on 12/18 due to worsening mental status.  In the ED, afebrile, mildly hypertensive, otherwise normal vitals.  Labs were notable for AKI and dehydration, stable anemia.  UA negative for UTI.  CXR negative.  Head CT without acute findings.  Patient treated with IV fluids and admitted for further management.     Of note, patient recently had Covid-19 infection but was cleared by state health department on 12/16, so is therefore no longer on precautions.  Subjective 12/21: Patient sleeping, daughter at bedside.  Reports patient has been somnolent this morning.  No acute events reported.  No agitation or this morning.  Daughter spoke with her brother and they have decided against any feeding tube if patient does not improve enough to tolerate oral intake.  Daughter agreeable to palliative care discussion, preferably tomorrow.  Assessment & Plan:   Principal Problem:   Acute metabolic encephalopathy Active Problems:   Hypernatremia   Iron deficiency anemia   S/P TAVR (transcatheter aortic valve replacement)   Dementia with psychosis (HCC)   Dehydration   Right humeral fracture   Acute renal failure (ARF) (HCC)   Acute metabolic encephalopathy -suspect this is multifactorial secondary to stroke, dehydration and acute renal failure, possible recent Covid contributing.  Urinalysis ruled out UTI.  Glucose has been within normal limits.  Does not appear to be on medications outpatient that would cause this.  MRI obtained and showed  acute/early subacute right parietal infarct.  Suspect some component of hypoactive delirium given underlying dementia. --continue to monitor for improvement --NPO for now --palliative care consulted,  Unclear prognosis at this time.   --Family have decided against feeding tube if patient unable to recover and take PO    Acute / Subacute CVA - present on admission MRI showed R parietal infarct. No known hx of A-fib per daughter.  Possibly has been hypercoaguable w recent Covid-19 infection, but has been on Eliquis (at low dose 2.5mg  due to hx of bleeding).  Carotid dopplers without significant stenosis. --resume Eliquis when able to take PO, Lovenox ppx dose for now --per daughter is unable to take ASA, caused bleeding --consult Neurology --Echo pending --telemetry monitoring --lipid panel w/ AM labs   Hypernatremia - sodium 142 on admission, now 148.  Due to poor p.o. intake and free water deficit. Free water deficit is 1.2 L. --change fluids to D5W+4940mEq K  --Monitor BMP closely   Acute kidney injury, present on admission.  Resolved with IV hydration.  Prerenal azotemia due to poor p.o. intake.  Creatinine on admission 1.32.  Baseline appears to be 0.7-0.9. --Monitor renal function   Dehydration - due to poor p.o. intake, causing AKI and hypernatremia, and likely altered mental status. Could be due to advancing dementia versus recent illnesses including Covid and UTI, or multifactorial. --IV hydration as above   Iron deficiency anemia -chronic, stable No evidence of active bleeding.  Baseline hemoglobin appears to be around 9-10.   Dementia without behavioral disturbance - currently altered from her baseline as outlined above. --Continue to monitor closely  Right humeral shaft fracture -Maintain sling and immobilizer to right upper extremity --Monitor for signs of pain   History of aortic valve replacement / TAVR -stable.   History of PE on chronic anticoagulation -acute PE  diagnosed in 2017, on Eliquis since that time. --Hold Eliquis, unlikely patient needs it continued this far out unless unprovoked.  Will chart review and discuss with family.   DVT prophylaxis: heparin   Code Status: DNR  Family Communication: daughter updated at bedside Disposition Plan:  Likely to SNF vs back to her ALF pending further clinical improvement in her encephalopathy.  Continue medical workup as above.   Consultants:  Neurology  Procedures:  Echo pending Carotid doppler 12/20  Antimicrobials:  none    Objective: Vitals:   12/20/18 0814 12/20/18 1707 12/20/18 2301 12/22/18 0043  BP: (!) 186/59 (!) 152/62 (!) 145/70 (!) 190/69  Pulse: 96 95 (!) 107 95  Resp: 16 16 16 19   Temp: 97.8 F (36.6 C) 97.8 F (36.6 C) 97.9 F (36.6 C)   TempSrc:      SpO2: 100% 95% 95% 96%  Weight:      Height:        Intake/Output Summary (Last 24 hours) at 12/22/2018 0739 Last data filed at 12/21/2018 1800 Gross per 24 hour  Intake 977.55 ml  Output --  Net 977.55 ml   Filed Weights   12/19/18 1312 12/20/18 0119  Weight: 56.7 kg 52.3 kg    Examination:  General exam: sleeping, no acute distress HEENT: atraumatic,dry mucus membranes Respiratory system: clear to auscultation anterioly, no wheezes, rales or rhonchi, normal respiratory effort. Cardiovascular system: normal S1/S2, RRR, no JVD, murmurs, rubs, gallops, no pedal edema.   Gastrointestinal system: soft, non-tender, non-distended abdomen Central nervous system: somnolent, does not follow commands Extremities: no cyanosis, normal tone, RUE in sling with immobilizer Skin: dry, intact, normal temperature, ecchymosis on right chest wall stable to improved   Data Reviewed: I have personally reviewed following labs and imaging studies  CBC: Recent Labs  Lab 12/19/18 1316 12/20/18 0424 12/22/18 0429  WBC 8.7 6.8 6.6  NEUTROABS  --   --  4.9  HGB 8.9* 8.2* 8.0*  HCT 28.5* 28.1* 27.1*  MCV 82.4 87.8 87.4   PLT 300 337 255   Basic Metabolic Panel: Recent Labs  Lab 12/19/18 1316 12/19/18 1750 12/20/18 0424 12/20/18 1244 12/22/18 0429  NA 142  --  146* 147* 148*  K 4.4  --  4.0 3.9 3.2*  CL 109  --  115* 115* 115*  CO2 18*  --  19* 19* 24  GLUCOSE 88  --  72 75 100*  BUN QUANTITY NOT SUFFICIENT, UNABLE TO PERFORM TEST 57* 45* 36* 17  CREATININE QUANTITY NOT SUFFICIENT, UNABLE TO PERFORM TEST 1.32* 0.92 0.74 0.67  CALCIUM 9.4  --  8.8* 8.9 8.9  MG  --   --   --   --  1.8   GFR: Estimated Creatinine Clearance: 41.7 mL/min (by C-G formula based on SCr of 0.67 mg/dL). Liver Function Tests: Recent Labs  Lab 12/19/18 1316 12/20/18 0424 12/22/18 0429  AST 28 21 21   ALT 17 13 12   ALKPHOS 82 76 76  BILITOT 1.2 1.2 1.3*  PROT 7.5 6.7 6.1*  ALBUMIN 3.8 3.4* 3.0*   No results for input(s): LIPASE, AMYLASE in the last 168 hours. Recent Labs  Lab 12/20/18 1244  AMMONIA 27   Coagulation Profile: No results for input(s): INR, PROTIME in the last 168  hours. Cardiac Enzymes: No results for input(s): CKTOTAL, CKMB, CKMBINDEX, TROPONINI in the last 168 hours. BNP (last 3 results) No results for input(s): PROBNP in the last 8760 hours. HbA1C: No results for input(s): HGBA1C in the last 72 hours. CBG: No results for input(s): GLUCAP in the last 168 hours. Lipid Profile: Recent Labs    12/22/18 0429  CHOL 191  HDL 39*  LDLCALC 132*  TRIG 99  CHOLHDL 4.9   Thyroid Function Tests: Recent Labs    12/20/18 1244  TSH 3.607  FREET4 1.12   Anemia Panel: Recent Labs    12/20/18 1501 12/21/18 0843  VITAMINB12 1,266*  --   FERRITIN  --  104   Sepsis Labs: Recent Labs  Lab 12/21/18 0843  PROCALCITON <0.10    Recent Results (from the past 240 hour(s))  Urine culture     Status: None   Collection Time: 12/19/18  5:39 PM   Specimen: Urine, Catheterized  Result Value Ref Range Status   Specimen Description   Final    URINE, CATHETERIZED Performed at [facility], [address] [place], [place] [address]    Special Requests   Final    NONE Performed at [facility], [address] [place], [place] [address]    Culture   Final    NO GROWTH Performed at [facility], [employer] [employer] [address], [place], [place] [address]    Report Status 12/21/2018 FINAL  Final  SARS CORONAVIRUS 2 (TAT 6-24 HRS) Nasopharyngeal Nasopharyngeal Swab     Status: Abnormal   Collection Time: 12/19/18 10:54 PM   Specimen: Nasopharyngeal Swab  Result Value Ref Range Status   SARS Coronavirus 2 POSITIVE (A) NEGATIVE Final    Comment: RESULT CALLED TO, READ BACK BY AND VERIFIED WITH: JADIE COHEN [email or web address]  ON 12.19.2020 BY TCALDWELL MT. (NOTE) SARS-CoV-2 target nucleic acids are DETECTED. The SARS-CoV-2 RNA is generally detectable in upper and lower respiratory specimens during the acute phase of infection. Positive results are indicative of the presence of SARS-CoV-2 RNA. Clinical correlation with patient history and other diagnostic information is  necessary to determine patient infection status. Positive results do not rule out bacterial infection or co-infection with other viruses.  The expected result is Negative. Fact Sheet for Patients: SugarRoll.be Fact Sheet for Healthcare Providers: https://www.woods-mathews.com/ This test is not yet approved or cleared by the [place] FDA and  has been authorized for detection and/or diagnosis of SARS-CoV-2 by FDA under an Emergency Use Authorization (EUA). This EUA will remain  in effect (meaning this test can  be used) for the duration of the COVID-19 declaration under Section 564(b)(1) of the Act, 21 U.S.C. section 360bbb-3(b)(1), unless the authorization is terminated or revoked sooner. Performed at [facility], [place] [address], [place], [place] [address]   MRSA PCR Screening     Status: None   Collection Time: 12/20/18  1:27 AM   Specimen:  Nasopharyngeal  Result Value Ref Range Status   MRSA by PCR NEGATIVE NEGATIVE Final    Comment:        The GeneXpert MRSA Assay (FDA approved for NASAL specimens only), is one component of a comprehensive MRSA colonization surveillance program. It is not intended to diagnose MRSA infection nor to guide or monitor treatment for MRSA infections. Performed at [facility], [address], [place], [place] [address]          Radiology Studies: MR BRAIN WO CONTRAST  Result Date: 12/20/2018  CLINICAL DATA:  Encephalopathy. EXAM: MRI HEAD WITHOUT CONTRAST TECHNIQUE: Multiplanar, multiecho pulse sequences of the brain and surrounding structures were obtained without intravenous contrast. COMPARISON:  Head CT 12/19/2018 and MRI 11/12/2014 FINDINGS: Brain: There is a 3 mm focus of hyperintense trace diffusion weighted signal in the subcortical white matter of the right parietal lobe with suggestion of subtly reduced ADC. No acute infarct is evident elsewhere. No intracranial hemorrhage, mass, midline shift, or extra-axial fluid collection is identified. Periventricular white matter T2 hyperintensities are unchanged from the prior MRI and nonspecific but compatible with chronic small vessel ischemia, minimal for age. Punctate chronic infarcts are suspected in the cerebellum bilaterally, not clearly present on the prior MRI. Generalized cerebral atrophy has mildly progressed from the prior MRI. Vascular: Major intracranial vascular flow voids are preserved. Skull and upper cervical spine: Unremarkable bone marrow signal. Sinuses/Orbits: Bilateral cataract extraction. Clear paranasal sinuses. Trace bilateral mastoid fluid. Other: None. IMPRESSION: 1. Suspected punctate acute/early subacute right parietal infarct. 2. Minimal chronic small vessel ischemic disease with punctate chronic cerebellar infarcts. Electronically Signed   By: Sebastian Ache M.D.   On: 12/20/2018 19:22        Scheduled  Meds:  vitamin C  500 mg Oral Daily   levothyroxine  25 mcg Intravenous Daily   thiamine  100 mg Oral Daily   Continuous Infusions:  dextrose 5 % with kcl     famotidine (PEPCID) IV 20 mg (12/21/18 0921)   potassium chloride       LOS: 3 days    Time spent: 35-40 minutes    Pennie Banter, DO [facility]   If 7PM-7AM, please contact night-coverage [email or web address] Password [facility] 12/22/2018, 7:39 AM

## 2018-12-23 LAB — COMPREHENSIVE METABOLIC PANEL
ALT: 13 U/L (ref 0–44)
AST: 21 U/L (ref 15–41)
Albumin: 3 g/dL — ABNORMAL LOW (ref 3.5–5.0)
Alkaline Phosphatase: 88 U/L (ref 38–126)
Anion gap: 10 (ref 5–15)
BUN: 16 mg/dL (ref 8–23)
CO2: 22 mmol/L (ref 22–32)
Calcium: 8.6 mg/dL — ABNORMAL LOW (ref 8.9–10.3)
Chloride: 108 mmol/L (ref 98–111)
Creatinine, Ser: 0.75 mg/dL (ref 0.44–1.00)
GFR calc Af Amer: >60 mL/min (ref >60)
GFR calc non Af Amer: >60 mL/min (ref >60)
Glucose, Bld: 105 mg/dL — ABNORMAL HIGH (ref 70–99)
Potassium: 4.6 mmol/L (ref 3.5–5.1)
Sodium: 140 mmol/L (ref 135–145)
Total Bilirubin: 1.4 mg/dL — ABNORMAL HIGH (ref 0.3–1.2)
Total Protein: 6.3 g/dL — ABNORMAL LOW (ref 6.5–8.1)

## 2018-12-23 LAB — CBC WITH DIFFERENTIAL/PLATELET
Abs Immature Granulocytes: 0.05 10*3/uL (ref 0.00–0.07)
Basophils Absolute: 0.1 10*3/uL (ref 0.0–0.1)
Basophils Relative: 1 %
Eosinophils Absolute: 0.1 10*3/uL (ref 0.0–0.5)
Eosinophils Relative: 2 %
HCT: 29.6 % — ABNORMAL LOW (ref 36.0–46.0)
Hemoglobin: 8.7 g/dL — ABNORMAL LOW (ref 12.0–15.0)
Immature Granulocytes: 1 %
Lymphocytes Relative: 15 %
Lymphs Abs: 1.2 10*3/uL (ref 0.7–4.0)
MCH: 25.9 pg — ABNORMAL LOW (ref 26.0–34.0)
MCHC: 29.4 g/dL — ABNORMAL LOW (ref 30.0–36.0)
MCV: 88.1 fL (ref 80.0–100.0)
Monocytes Absolute: 0.7 10*3/uL (ref 0.1–1.0)
Monocytes Relative: 10 %
Neutro Abs: 5.4 10*3/uL (ref 1.7–7.7)
Neutrophils Relative %: 71 %
Platelets: 236 10*3/uL (ref 150–400)
RBC: 3.36 MIL/uL — ABNORMAL LOW (ref 3.87–5.11)
RDW: 22.3 % — ABNORMAL HIGH (ref 11.5–15.5)
Smear Review: NORMAL
WBC: 7.5 10*3/uL (ref 4.0–10.5)
nRBC: 0 % (ref 0.0–0.2)

## 2018-12-23 LAB — MAGNESIUM: Magnesium: 1.8 mg/dL (ref 1.7–2.4)

## 2018-12-23 MED ORDER — LABETALOL HCL 5 MG/ML IV SOLN: 10.0000 mg | INTRAVENOUS | Status: DC | PRN

## 2018-12-23 MED ORDER — THIAMINE HCL 100 MG/ML IJ SOLN
100.0000 mg | Freq: Every day | INTRAMUSCULAR | Status: DC
  Administered 2018-12-24: 09:00:00 100 mg via INTRAVENOUS
  Filled 2018-12-23: qty 2

## 2018-12-23 MED ORDER — SODIUM CHLORIDE 0.9 % IV SOLN: INTRAVENOUS | Status: DC

## 2018-12-23 MED ORDER — MODAFINIL 100 MG PO TABS
100.0000 mg | ORAL_TABLET | Freq: Every day | ORAL | Status: DC
  Filled 2018-12-23: qty 1

## 2018-12-23 NOTE — Progress Notes (Signed)
Subjective: No change overnight. Patient sleepy, difficulty to arouse.   Past Medical History:  Diagnosis Date  . Anxiety   . Asthma   . AVM (arteriovenous malformation)    a. small intestine with recurrent GI bleeding.   . B12 deficiency   . Chronic diastolic CHF (congestive heart failure) (HCC)   . Hip fracture (HCC)    a. in the setting of mechanical fall  . Iron deficiency   . Iron deficiency anemia    a. requiring periodic transfusions  . Psychosis (HCC)   . S/P TAVR (transcatheter aortic valve replacement)    a. 02/12/17: s/p TAVR w/ an Edwards Sapien 3 THV (size 23 mm, model # 9600TFX, serial # [id number])  . Severe aortic stenosis   . Syncope and collapse   . Thyroid disease     Past Surgical History:  Procedure Laterality Date  . APPENDECTOMY    . BACK SURGERY     back fusion  . CARDIAC CATHETERIZATION    . ESOPHAGOGASTRODUODENOSCOPY (EGD) WITH PROPOFOL N/A 11/15/2015   Procedure: ESOPHAGOGASTRODUODENOSCOPY (EGD) WITH PROPOFOL;  Surgeon: [name], MD;  Location: [facility] ENDOSCOPY;  Service: Endoscopy;  Laterality: N/A;  . ESOPHAGOGASTRODUODENOSCOPY (EGD) WITH PROPOFOL N/A 10/12/2016   Procedure: ESOPHAGOGASTRODUODENOSCOPY (EGD) WITH PROPOFOL;  Surgeon: [name], MD;  Location: [facility] ENDOSCOPY;  Service: Endoscopy;  Laterality: N/A;  . FEMUR SURGERY     broken with rod  . FRACTURE SURGERY    . GIVENS CAPSULE STUDY N/A 02/20/2016   Procedure: GIVENS CAPSULE STUDY;  Surgeon: Scot Jun, MD;  Location: [facility] ENDOSCOPY;  Service: Endoscopy;  Laterality: N/A;  . HIP FRACTURE SURGERY    . INTRAMEDULLARY (IM) NAIL INTERTROCHANTERIC Right 11/18/2015   Procedure: INTRAMEDULLARY (IM) NAIL INTERTROCHANTRIC;  Surgeon: Juanell Fairly, MD;  Location: [facility] ORS;  Service: Orthopedics;  Laterality: Right;  . KYPHOPLASTY N/A 08/19/2014   Procedure: KYPHOPLASTY;  Surgeon: Kennedy Bucker, MD;  Location: [facility] ORS;  Service: Orthopedics;  Laterality: N/A;  . PARTIAL HYSTERECTOMY    .  RIGHT/LEFT HEART CATH AND CORONARY ANGIOGRAPHY N/A 10/05/2016   Procedure: RIGHT/LEFT HEART CATH AND CORONARY ANGIOGRAPHY;  Surgeon: Kathleene Hazel, MD;  Location: [facility] LAB;  Service: Cardiovascular;  Laterality: N/A;  . TEE WITHOUT CARDIOVERSION N/A 02/12/2017   Procedure: TRANSESOPHAGEAL ECHOCARDIOGRAM (TEE);  Surgeon: Kathleene Hazel, MD;  Location: [facility] OR;  Service: Open Heart Surgery;  Laterality: N/A;  . TRANSCATHETER AORTIC VALVE REPLACEMENT, TRANSFEMORAL N/A 02/12/2017   Procedure: TRANSCATHETER AORTIC VALVE REPLACEMENT, TRANSFEMORAL using a 23mm Edwards Sapien 3 Aortic Valve;  Surgeon: Kathleene Hazel, MD;  Location: [facility] OR;  Service: Open Heart Surgery;  Laterality: N/A;  . TUBAL LIGATION      Family History  Problem Relation Age of Onset  . Heart block Mother   . Heart failure Father   . Heart disease Sister   . Breast cancer Maternal Aunt     Social History:  reports that she has never smoked. She has never used smokeless tobacco. She reports that she does not drink alcohol or use drugs.  Allergies  Allergen Reactions  . Aspirin Other (See Comments)    GI bleeding   . Penicillins Rash and Other (See Comments)    PATIENT HAS HAD A PCN REACTION WITH IMMEDIATE RASH, FACIAL/TONGUE/THROAT SWELLING, SOB, OR LIGHTHEADEDNESS WITH HYPOTENSION:  #  #  #  YES  #  #  #   Has patient had a PCN reaction causing severe rash involving mucus membranes or  skin necrosis: No Has patient had a PCN reaction that required hospitalization: No Has patient had a PCN reaction occurring within the last 10 years: No     Medications:  I have reviewed the patient's current medications. Prior to Admission:  Medications Prior to Admission  Medication Sig Dispense Refill Last Dose  . acetaminophen (TYLENOL) 325 MG tablet Take 650 mg by mouth every 6 (six) hours as needed.   prn at prn  . Acetaminophen 500 MG coapsule Take 500 mg by mouth 3 (three) times daily.     12/19/2018 at 0600  . apixaban (ELIQUIS) 2.5 MG TABS tablet Take 1 tablet (2.5 mg total) by mouth 2 (two) times daily. 180 tablet 2 12/19/2018 at 0600  . Calcium Carbonate-Vitamin D (CALCIUM-VITAMIN D) 600-125 MG-UNIT TABS Take by mouth daily.   12/19/2018 at 0600  . Cholecalciferol (VITAMIN D-1000 MAX ST) 25 MCG (1000 UT) tablet TAKE (1) TABLET BY MOUTH ONCE A DAY   12/19/2018 at 0600  . clobetasol cream (TEMOVATE) 4.09 % Apply 1 application topically See admin instructions. Apply small amount vaginally on Monday and Friday   12/18/2018 at 2000  . estradiol (ESTRACE) 0.1 MG/GM vaginal cream Place 1 Applicatorful vaginally See admin instructions. Apply 0.5 grams vaginally Tuesday and Thursday morning   Past Week at 2000  . furosemide (LASIX) 20 MG tablet Take 1 tablet (20 mg total) by mouth daily. 5 tablet 0 12/19/2018 at 0600  . iron polysaccharides (NIFEREX) 150 MG capsule Take 150 mg by mouth daily.   12/19/2018 at 0600  . levothyroxine (SYNTHROID, LEVOTHROID) 50 MCG tablet Take 50 mcg by mouth daily before breakfast.    12/19/2018 at 0600  . loperamide (IMODIUM) 2 MG capsule Take 2 mg by mouth 2 (two) times daily.   12/19/2018 at 0600  . loratadine (CLARITIN) 10 MG tablet Take 10 mg by mouth daily.    12/19/2018 at 0600  . memantine (NAMENDA) 10 MG tablet Take 10 mg by mouth 2 (two) times daily.   0 12/19/2018 at 0600  . nitrofurantoin (MACRODANTIN) 50 MG capsule Take 1 capsule (50 mg total) by mouth at bedtime. 30 capsule 2 12/18/2018 at 2000  . nystatin (NYSTATIN) powder Apply 1 application topically 2 (two) times daily as needed.   prn at prn  . OLANZapine (ZYPREXA) 10 MG tablet Take 10 mg by mouth at bedtime.   12/18/2018 at 2000  . omeprazole (PRILOSEC) 20 MG capsule Take 1 capsule (20 mg total) by mouth daily. 90 capsule 3 12/19/2018 at 0600  . polyvinyl alcohol (LIQUIFILM TEARS) 1.4 % ophthalmic solution Place 1 drop into both eyes as needed for dry eyes.   prn at prn  .  sennosides-docusate sodium (SENOKOT-S) 8.6-50 MG tablet Take 1 tablet by mouth daily.   12/19/2018 at 0600  . sertraline (ZOLOFT) 50 MG tablet Take 1 tablet (50 mg total) by mouth daily. 30 tablet 2 12/19/2018 at 0600  . vitamin B-12 (CYANOCOBALAMIN) 100 MCG tablet Take 1,000 mcg by mouth daily.    12/19/2018 at 0600  . clindamycin (CLEOCIN) 150 MG capsule Take 150 mg by mouth 3 (three) times daily. As needed for dental procedures   prn at prn  . sulfamethoxazole-trimethoprim (BACTRIM DS) 800-160 MG tablet Take 1 tablet by mouth 2 (two) times daily.   Completed Course at Unknown time   Scheduled: . vitamin C  500 mg Oral Daily  . enoxaparin (LOVENOX) injection  40 mg Subcutaneous Q24H  . levothyroxine  25 mcg Intravenous  Daily  . modafinil  100 mg Oral Daily  . thiamine  100 mg Oral Daily    ROS: Unable to provide due to mental status  Physical Examination: Blood pressure (!) 177/76, pulse 94, temperature 97.6 F (36.4 C), temperature source Axillary, resp. rate 15, height  (1.6 m), weight 52.3 kg, SpO2 100 %.   Neurological Examination   Mental Status: Alert, perseverates.  Not oriented.   Cranial Nerves: II: Visual fields grossly normal, pupils equal, round, reactive to light and accommodation III,IV, VI: ptosis not present, extra-ocular motions intact bilaterally V,VII: smile symmetric, facial light touch sensation normal bilaterally VIII: hearing normal bilaterally IX,X: gag reflex present XI: bilateral shoulder shrug XII: midline tongue extension Motor: RUE in sling.  Moves all other extremities against gravity spontaneously Sensory: Pinprick and light touch intact throughout, bilaterally Deep Tendon Reflexes: Symmetric throughout Plantars: Right: mute   Left: upgoing Cerebellar: Unable to perform due to confusion Gait: not tested due to safety concerns    Laboratory Studies:   Basic Metabolic Panel: Recent Labs  Lab 12/19/18 1316 12/19/18 1750  12/20/18 0424 12/20/18 1244 12/22/18 0429 12/23/18 0451  NA 142  --  146* 147* 148* 140  K 4.4  --  4.0 3.9 3.2* 4.6  CL 109  --  115* 115* 115* 108  CO2 18*  --  19* 19* 24 22  GLUCOSE 88  --  72 75 100* 105*  BUN QUANTITY NOT SUFFICIENT, UNABLE TO PERFORM TEST 57* 45* 36* 17 16  CREATININE QUANTITY NOT SUFFICIENT, UNABLE TO PERFORM TEST 1.32* 0.92 0.74 0.67 0.75  CALCIUM 9.4  --  8.8* 8.9 8.9 8.6*  MG  --   --   --   --  1.8 1.8    Liver Function Tests: Recent Labs  Lab 12/19/18 1316 12/20/18 0424 12/22/18 0429 12/23/18 0451  AST ALT ALKPHOS 82 76 76 88  BILITOT 1.2 1.2 1.3* 1.4*  PROT 7.5 6.7 6.1* 6.3*  ALBUMIN 3.8 3.4* 3.0* 3.0*   No results for input(s): LIPASE, AMYLASE in the last 168 hours. Recent Labs  Lab 12/20/18 1244  AMMONIA 27    CBC: Recent Labs  Lab 12/19/18 1316 12/20/18 0424 12/22/18 0429 12/23/18 0451  WBC 8.7 6.8 6.6 7.5  NEUTROABS  --   --  4.9 5.4  HGB 8.9* 8.2* 8.0* 8.7*  HCT 28.5* 28.1* 27.1* 29.6*  MCV 82.4 87.8 87.4 88.1  PLT 300 337 255 236    Cardiac Enzymes: No results for input(s): CKTOTAL, CKMB, CKMBINDEX, TROPONINI in the last 168 hours.  BNP: Invalid input(s): POCBNP  CBG: No results for input(s): GLUCAP in the last 168 hours.  Microbiology: Results for orders placed or performed during the hospital encounter of 12/19/18  Urine culture     Status: None   Collection Time: 12/19/18  5:39 PM   Specimen: Urine, Catheterized  Result Value Ref Range Status   Specimen Description   Final    URINE, CATHETERIZED Performed at [facility], [address], [place], [place] [address]    Special Requests   Final    NONE Performed at [facility], [address], [place], [place] [address]    Culture   Final    NO GROWTH Performed at [facility], [address] [place], [place] [address]    Report Status 12/21/2018 FINAL  Final  SARS CORONAVIRUS 2 (TAT 6-24 HRS)  Nasopharyngeal  Nasopharyngeal Swab     Status: Abnormal   Collection Time: 12/19/18 10:54 PM   Specimen: Nasopharyngeal Swab  Result Value Ref Range Status   SARS Coronavirus 2 POSITIVE (A) NEGATIVE Final    Comment: RESULT CALLED TO, READ BACK BY AND VERIFIED WITH: JADIE COHEN [email or web address].@[email or web address]  ON 12.19.2020 BY TCALDWELL MT. (NOTE) SARS-CoV-2 target nucleic acids are DETECTED. The SARS-CoV-2 RNA is generally detectable in upper and lower respiratory specimens during the acute phase of infection. Positive results are indicative of the presence of SARS-CoV-2 RNA. Clinical correlation with patient history and other diagnostic information is  necessary to determine patient infection status. Positive results do not rule out bacterial infection or co-infection with other viruses.  The expected result is Negative. Fact Sheet for Patients: HairSlick.no Fact Sheet for Healthcare Providers: quierodirigir.com This test is not yet approved or cleared by the [place] FDA and  has been authorized for detection and/or diagnosis of SARS-CoV-2 by FDA under an Emergency Use Authorization (EUA). This EUA will remain  in effect (meaning this test can  be used) for the duration of the COVID-19 declaration under Section 564(b)(1) of the Act, 21 U.S.C. section 360bbb-3(b)(1), unless the authorization is terminated or revoked sooner. Performed at [facility], [address] [place], [place] [address]   MRSA PCR Screening     Status: None   Collection Time: 12/20/18  1:27 AM   Specimen: Nasopharyngeal  Result Value Ref Range Status   MRSA by PCR NEGATIVE NEGATIVE Final    Comment:        The GeneXpert MRSA Assay (FDA approved for NASAL specimens only), is one component of a comprehensive MRSA colonization surveillance program. It is not intended to diagnose MRSA infection nor to guide or monitor treatment for MRSA infections. Performed at  [facility], [address] [place], [place] [address]     Coagulation Studies: No results for input(s): LABPROT, INR in the last 72 hours.  Urinalysis:  Recent Labs  Lab 12/19/18 1739  COLORURINE YELLOW*  LABSPEC 1.017  PHURINE 5.0  GLUCOSEU NEGATIVE  HGBUR NEGATIVE  BILIRUBINUR NEGATIVE  KETONESUR 5*  PROTEINUR NEGATIVE  NITRITE NEGATIVE  LEUKOCYTESUR NEGATIVE    Lipid Panel:     Component Value Date/Time   CHOL 191 12/22/2018 0429   TRIG 99 12/22/2018 0429   HDL 39 (L) 12/22/2018 0429   CHOLHDL 4.9 12/22/2018 0429   VLDL 20 12/22/2018 0429   LDLCALC 132 (H) 12/22/2018 0429    HgbA1C:  Lab Results  Component Value Date   HGBA1C 5.0 02/06/2017    Urine Drug Screen:  No results found for: LABOPIA, COCAINSCRNUR, LABBENZ, AMPHETMU, THCU, LABBARB  Alcohol Level: No results for input(s): ETH in the last 168 hours.  Other results: EKG: normal sinus rhythm at 88 bpm, LBBB.  Imaging: EEG  Result Date: 12/22/2018 Charlsie Quest, MD     12/22/2018  4:26 PM Patient Name: Kelsey Le MRN: [id number] Epilepsy Attending: Charlsie Quest Referring Physician/Provider: Dr Thana Farr Date: 12/22/2018 Duration: 25.39 mins Patient history: 85yo F with COVID+ and ams. EEG to evaluate for seizure Level of alertness: awake AEDs during EEG study: None Technical aspects: This EEG study was done with scalp electrodes positioned according to the 10-20 International system of electrode placement. Electrical activity was acquired at a sampling rate of  and reviewed with a high frequency filter of  and a low frequency filter of . EEG data were recorded continuously and digitally stored.  DESCRIPTION: During awake state, no clear posterior dominant rhythm was seen. Continuous slow 3-6Hz  theta-delta slowing was seen. Triphasic waves, generalized, maximal bifrontal were seen at times. Photic driving was not seen during photic stimulation. Hyperventilation was not  performed ABNORMALITY - Continuous slow, generalized - Triphasic waves, generalized, maximal bifrontal IMPRESSION: This study is suggestive of moderate to severe diffuse encephalopathy, non specific to etiology but could be secondary to toxic-metabolic causes. No seizures or epileptiform discharges were seen throughout the recording. Charlsie Quest   US Carotid Bilateral  Result Date: 12/22/2018 CLINICAL DATA:  Cerebral infarct. EXAM: BILATERAL CAROTID DUPLEX ULTRASOUND TECHNIQUE: Wallace Cullens scale imaging, color Doppler and duplex ultrasound were performed of bilateral carotid and vertebral arteries in the neck. COMPARISON:  None. FINDINGS: Criteria: Quantification of carotid stenosis is based on velocity parameters that correlate the residual internal carotid diameter with NASCET-based stenosis levels, using the diameter of the distal internal carotid lumen as the denominator for stenosis measurement. The following velocity measurements were obtained: RIGHT ICA:  58/5 cm/sec CCA:  94/12 cm/sec SYSTOLIC ICA/CCA RATIO:  0.6 ECA:  59 cm/sec LEFT ICA:  68/13 cm/sec CCA:  55/11 cm/sec SYSTOLIC ICA/CCA RATIO:  1.4 ECA:  61 cm/sec RIGHT CAROTID ARTERY: Intimal thickening in the common carotid artery and carotid bulb. No focal plaque identified. No evidence of right ICA stenosis. RIGHT VERTEBRAL ARTERY: Antegrade flow with normal waveform and velocity. LEFT CAROTID ARTERY: Mild plaque at the level of the left carotid bulb and left ICA origin. No significant stenosis identified with estimated left ICA stenosis of less than 50%. LEFT VERTEBRAL ARTERY: Antegrade flow with normal waveform and velocity. IMPRESSION: Mild plaque at the level of the left carotid bulb and left ICA origin. Estimated left ICA stenosis is less than 50%. Electronically Signed   By: Irish Lack M.D.   On: 12/22/2018 07:50   ECHOCARDIOGRAM COMPLETE  Result Date: 12/22/2018   ECHOCARDIOGRAM REPORT   Patient Name:   MARLITA KEIL Date of Exam:  12/22/2018 Medical Rec #:  [id number]      Height:       63.0 in Accession #:    [id number]     Weight:       115.4 lb Date of Birth:  Jun 24, 1930      BSA:          1.53 m Patient Age:    85 years       BP:           169/60 mmHg Patient Gender: F              HR:           97 bpm. Exam Location:  [facility] Procedure: 2D Echo, Color Doppler and Cardiac Doppler Indications:     Stroke 434.91  History:         Patient has prior history of Echocardiogram examinations, most                  recent 01/31/2018. Syncope and collapse, s/p TAVR.  Sonographer:     Cristela Blue RDCS (AE) Referring Phys:  [id number] Tresa Endo A GRIFFITH Diagnosing Phys: Lorine Bears MD  Sonographer Comments: Technically difficult study due to poor echo windows, suboptimal parasternal window, no apical window and no subcostal window. Pt had right arm in sling and large tight elastic bandage around torso. Pt could not be positioned on left side. IMPRESSIONS  1. Technically difficult study due to poor echo windows, suboptimal parasternal window, no apical window  and no subcostal window  2. Left ventricular ejection fraction, by visual estimation, is 55 to 60%. The left ventricle has normal function. There is moderately increased left ventricular hypertrophy.  3. The left ventricle has no regional wall motion abnormalities.  4. Global right ventricle has normal systolic function.The right ventricular size is normal. No increase in right ventricular wall thickness.  5. Left atrial size was normal.  6. Right atrial size was not well visualized.  7. Small pericardial effusion.  8. The pericardial effusion is posterior to the left ventricle.  9. The mitral valve was not well visualized. No evidence of mitral valve regurgitation. No evidence of mitral stenosis. 10. The tricuspid valve is normal in structure. Tricuspid valve regurgitation is not demonstrated. 11. The aortic valve was not well visualized. Aortic valve regurgitation is not visualized. 12. The pulmonic  valve was normal in structure. Pulmonic valve regurgitation is not visualized. 13. Left ventricular diastolic function could not be evaluated. 14. TR signal is inadequate for assessing pulmonary artery systolic pressure. 15. The aortic root was not well visualized. FINDINGS  Left Ventricle: Left ventricular ejection fraction, by visual estimation, is 55 to 60%. The left ventricle has normal function. The left ventricle has no regional wall motion abnormalities. There is moderately increased left ventricular hypertrophy. Left ventricular diastolic function could not be evaluated. Normal left atrial pressure. Right Ventricle: The right ventricular size is normal. No increase in right ventricular wall thickness. Global RV systolic function is has normal systolic function. Left Atrium: Left atrial size was normal in size. Right Atrium: Right atrial size was not well visualized Pericardium: A small pericardial effusion is present. The pericardial effusion is posterior to the left ventricle. Mitral Valve: The mitral valve was not well visualized. No evidence of mitral valve regurgitation. No evidence of mitral valve stenosis by observation. Tricuspid Valve: The tricuspid valve is normal in structure. Tricuspid valve regurgitation is not demonstrated. Aortic Valve: The aortic valve was not well visualized. Aortic valve regurgitation is not visualized. Pulmonic Valve: The pulmonic valve was normal in structure. Pulmonic valve regurgitation is not visualized. Pulmonic regurgitation is not visualized. Aorta: The aortic root, ascending aorta and aortic arch are all structurally normal, with no evidence of dilitation or obstruction and the aortic root was not well visualized. Venous: The inferior vena cava was not well visualized. IAS/Shunts: No atrial level shunt detected by color flow Doppler. There is no evidence of a patent foramen ovale. No ventricular septal defect is seen or detected. There is no evidence of an atrial  septal defect.  LEFT VENTRICLE PLAX 2D LVIDd:         3.41 cm LVIDs:         2.35 cm LV PW:         1.21 cm LV IVS:        1.63 cm LVOT diam:     2.00 cm LV SV:         29 ml LV SV Index:   18.79 LVOT Area:     3.14 cm  LEFT ATRIUM         Index LA diam:    3.30 cm 2.16 cm/m   AORTA Ao Root diam: 2.20 cm  SHUNTS Systemic Diam: 2.00 cm  Lorine BearsMuhammad Arida MD Electronically signed by Lorine BearsMuhammad Arida MD Signature Date/Time: 12/22/2018/2:34:30 PM    Final      Assessment/Plan: 85 year old female with multiple medical problems including dementia admitted with altered mental status and frequent falls.  From review of record patient has had multiple encounters this year with altered mental status.  On Namenda and Zyprexa prior to admission.  With this admission patient with AKI and dehydration.  Has had recent COVID infection.  MRI of the brain reviewed and there is a small acute right parietal infarct noted.  Likely embolic with history of atrial fibrillation on Eliquis.  Suspect mental status is multifactorial and related to stroke, AKI, dehydration, recent COVID and hospitalization superimposed on an underlying dementia.  TSH, B12, ammonia are unremarkable.   - As above multifactorial reason for mental status along with progressive vascular dementia in the setting of ischemia - Pt also likely has hypoactive delirium that is contributing to worsening mentation  - Lights turned on, stimulation during the day. Discussed with daughter at bedside  - Hydration and AKI treatment as per primary team - Continue Eliquis - EEG no seizure activity - Will start provigil 100 daily to see if any improvement in wakefulness

## 2018-12-23 NOTE — Plan of Care (Signed)
PMT note:  Family meeting tomorrow at 10:00.

## 2018-12-23 NOTE — Progress Notes (Signed)
PROGRESS NOTE    DYLANN LAYNE  FHL:[id number] DOB: 1930/11/05 DOA: 12/19/2018  PCP: Marguarite Arbour, MD    LOS - 4   Brief Narrative:  85 y.o.female, ALF resident,with medical history significant ofdiastolic CHF,pulmonary embolism on chronic anticoagulation,dementia, recurrent AVMs, asthma, iron deficiency anemia,recurrent UTI's,who has had recent recurrentfalls at the facility including right humeral shaft fracture currently in immobilizer.Brought to ED on 12/18 due to worsening mental status. In the ED, afebrile, mildly hypertensive, otherwise normal vitals. Labs were notable for AKI and dehydration, stable anemia. UA negative for UTI. CXR negative. Head CT without acute findings. Patient treated with IV fluids and admitted for further management. MRI subsequently showed R parietal acute to early subacute infarct.  Patient has remained minimally responsive, often somnolent, does not appear to recognize daughter and not able to follow commands.  At baseline, patient's dementia is quite mild, per daughter.  Palliative care team consulted.    Of note, patient recently had Covid-19 infection but was cleared by state health department on 12/16, so is therefore no longer on precautions.  Subjective 12/22: Patient continues to just sleep.  Not interacting or responsive.  No acute events reported.  Patient appears in no distress.  Daughter Olegario Messier at bedside, all of her questions answered.   Assessment & Plan:   Principal Problem:   Acute metabolic encephalopathy Active Problems:   Hypernatremia   Iron deficiency anemia   S/P TAVR (transcatheter aortic valve replacement)   Dementia with psychosis (HCC)   Dehydration   Right humeral fracture   Acute renal failure (ARF) (HCC)   Acute metabolic encephalopathy-suspect this is multifactorial secondary to stroke, dehydration and acute renal failure, possible recent Covid contributing, hypoactive delirium in setting of  underlying dementia. Urinalysis ruledout UTI. Glucose has been within normal limits. Does not appear to be on medications outpatient that would cause this. MRI obtained and showed acute/early subacute right parietal infarct.  Suspect some component of hypoactive delirium given underlying dementia. Neurology wants to try modafinil but patient not taking any PO at this time.  --neurology following --continue to monitor for improvement --NPO for now --palliative care consulted,  Unclear prognosis at this time.   --Family have decided against feeding tube if patient unable to recover and take PO   Acute / Subacute CVA - present on admission MRI showed R parietal infarct. No known hx of A-fib per daughter.  Possibly has been hypercoaguable w recent Covid-19 infection, but has been on Eliquis (at low dose 2.5mg  due to hx of bleeding).  Carotid dopplers without significant stenosis. --resume Eliquis when able to take PO, Lovenox ppx dose for now --per daughter is unable to take ASA, caused bleeding --Neurology following --Echo pending --telemetry monitoring --lipid panel w/ AM labs  Hypernatremia-resolved.  Sodium 142 on admission, peaked at 148, normal at 140 after D5W given.  Due to no PO intake and water deficit.   --stop D5W+84mEq K  --monitor overnight off fluids & recheck in AM  Acute kidney injury,present on admission.  Resolved with IV hydration. Prerenal azotemia due to poor p.o. intake. Creatinine on admission 1.32. Baseline appears to be 0.7-0.9. --Monitor renal function  Dehydration-due to poor p.o. intake,causing AKI and hypernatremia, and likely altered mental status. Could be due to advancing dementia versus recent illnesses including Covid and UTI,or multifactorial. --IV hydration as above  Iron deficiency anemia-chronic, stable No evidence of active bleeding. Baseline hemoglobin appears to be around 9-10.  Dementia without behavioral  disturbance-currently altered from her  baseline as outlined above. --Continue to monitor closely  Right humeral shaft fracture -Maintain sling and immobilizer to right upper extremity --Monitor for signs of pain  History of aortic valve replacement/ TAVR-stable.  History of PE on chronic anticoagulation-acute PE diagnosed in 2017, on Eliquis since that time. --Hold Eliquis, unlikely patient needs it continued this far out unless unprovoked. Will chart review and discuss with family.   DVT prophylaxis: Lovenox   Code Status: DNR  Family Communication: daughter, Olegario Messier, updated at bedside this AM  Disposition Plan:  To SNF pending bed, palliative care consult, neurology clearance for discharge.  Continue medical management as above.   Consultants:   Neurology  Palliative Care  Procedures: Include things that cannot be auto populated i.e. Echo, Carotid and venous dopplers, Foley, Bipap, HD, tubes/drains, wound vac, central lines etc)  Echo  Carotid doppler 12/20  Antimicrobials:   None    Objective: Vitals:   12/22/18 1813 12/23/18 0032 12/23/18 0408 12/23/18 0752  BP: (!) 162/77 (!) 175/80 (!) 164/83 (!) 174/79  Pulse: (!) 108 (!) 109 (!) 109 (!) 101  Resp: Temp: 99.1 F (37.3 C) 98.1 F (36.7 C) 98.2 F (36.8 C) 97.8 F (36.6 C)  TempSrc: Axillary  Oral Oral  SpO2: 97% 94% 97% 100%  Weight:      Height:        Intake/Output Summary (Last 24 hours) at 12/23/2018 0802 Last data filed at 12/22/2018 1600 Gross per 24 hour  Intake 352.8 ml  Output --  Net 352.8 ml   Filed Weights   12/19/18 1312 12/20/18 0119  Weight: 56.7 kg 52.3 kg    Examination:  General exam: frail, sleeping comfortably, unresponsive, no acute distress Respiratory system: clear to auscultation anteriorly, no wheezes, rales or rhonchi, normal respiratory effort. Cardiovascular system: normal S1/S2, RRR, no JVD, murmurs, rubs, gallops, no pedal edema.    Gastrointestinal system: soft, non-tender, non-distended abdomen, normal bowel sounds. Central nervous system: somnolent. Does not follow commands.   Extremities: no edema, no cyanosis, normal tone Skin: ecchyomosis on right chest wall continue to improve, skin dry, intact, normal temperature    Data Reviewed: I have personally reviewed following labs and imaging studies  CBC: Recent Labs  Lab 12/19/18 1316 12/20/18 0424 12/22/18 0429 12/23/18 0451  WBC 8.7 6.8 6.6 7.5  NEUTROABS  --   --  4.9 5.4  HGB 8.9* 8.2* 8.0* 8.7*  HCT 28.5* 28.1* 27.1* 29.6*  MCV 82.4 87.8 87.4 88.1  PLT 300 337 255 236   Basic Metabolic Panel: Recent Labs  Lab 12/19/18 1316 12/19/18 1750 12/20/18 0424 12/20/18 1244 12/22/18 0429 12/23/18 0451  NA 142  --  146* 147* 148* 140  K 4.4  --  4.0 3.9 3.2* 4.6  CL 109  --  115* 115* 115* 108  CO2 18*  --  19* 19* 24 22  GLUCOSE 88  --  72 75 100* 105*  BUN QUANTITY NOT SUFFICIENT, UNABLE TO PERFORM TEST 57* 45* 36* 17 16  CREATININE QUANTITY NOT SUFFICIENT, UNABLE TO PERFORM TEST 1.32* 0.92 0.74 0.67 0.75  CALCIUM 9.4  --  8.8* 8.9 8.9 8.6*  MG  --   --   --   --  1.8 1.8   GFR: Estimated Creatinine Clearance: 41.7 mL/min (by C-G formula based on SCr of 0.75 mg/dL). Liver Function Tests: Recent Labs  Lab 12/19/18 1316 12/20/18 0424 12/22/18 0429 12/23/18 0451  AST 21  ALT 17 13 12 13   ALKPHOS 82 76 76 88  BILITOT 1.2 1.2 1.3* 1.4*  PROT 7.5 6.7 6.1* 6.3*  ALBUMIN 3.8 3.4* 3.0* 3.0*   No results for input(s): LIPASE, AMYLASE in the last 168 hours. Recent Labs  Lab 12/20/18 1244  AMMONIA 27   Coagulation Profile: No results for input(s): INR, PROTIME in the last 168 hours. Cardiac Enzymes: No results for input(s): CKTOTAL, CKMB, CKMBINDEX, TROPONINI in the last 168 hours. BNP (last 3 results) No results for input(s): PROBNP in the last 8760 hours. HbA1C: No results for input(s): HGBA1C in the last 72 hours. CBG: No  results for input(s): GLUCAP in the last 168 hours. Lipid Profile: Recent Labs    12/22/18 0429  CHOL 191  HDL 39*  LDLCALC 132*  TRIG 99  CHOLHDL 4.9   Thyroid Function Tests: Recent Labs    12/20/18 1244  TSH 3.607  FREET4 1.12   Anemia Panel: Recent Labs    12/20/18 1501 12/21/18 0843  VITAMINB12 1,266*  --   FERRITIN  --  104   Sepsis Labs: Recent Labs  Lab 12/21/18 0843  PROCALCITON <0.10    Recent Results (from the past 240 hour(s))  Urine culture     Status: None   Collection Time: 12/19/18  5:39 PM   Specimen: Urine, Catheterized  Result Value Ref Range Status   Specimen Description   Final    URINE, CATHETERIZED Performed at [facility], [address] [place], [place] [address]    Special Requests   Final    NONE Performed at [facility], [address] [place], [place] [address]    Culture   Final    NO GROWTH Performed at [facility], [place] [address] [place], [place] [address]    Report Status 12/21/2018 FINAL  Final  SARS CORONAVIRUS 2 (TAT 6-24 HRS) Nasopharyngeal Nasopharyngeal Swab     Status: Abnormal   Collection Time: 12/19/18 10:54 PM   Specimen: Nasopharyngeal Swab  Result Value Ref Range Status   SARS Coronavirus 2 POSITIVE (A) NEGATIVE Final    Comment: RESULT CALLED TO, READ BACK BY AND VERIFIED WITH: JADIE COHEN RN.@[email or web address]  ON 12.19.2020 BY TCALDWELL MT. (NOTE) SARS-CoV-2 target nucleic acids are DETECTED. The SARS-CoV-2 RNA is generally detectable in upper and lower respiratory specimens during the acute phase of infection. Positive results are indicative of the presence of SARS-CoV-2 RNA. Clinical correlation with patient history and other diagnostic information is  necessary to determine patient infection status. Positive results do not rule out bacterial infection or co-infection with other viruses.  The expected result is Negative. Fact Sheet for  Patients: SugarRoll.be Fact Sheet for Healthcare Providers: https://www.woods-mathews.com/ This test is not yet approved or cleared by the [place] FDA and  has been authorized for detection and/or diagnosis of SARS-CoV-2 by FDA under an Emergency Use Authorization (EUA). This EUA will remain  in effect (meaning this test can  be used) for the duration of the COVID-19 declaration under Section 564(b)(1) of the Act, 21 U.S.C. section 360bbb-3(b)(1), unless the authorization is terminated or revoked sooner. Performed at [facility], [place] [address], [place], [place] [address]   MRSA PCR Screening     Status: None   Collection Time: 12/20/18  1:27 AM   Specimen: Nasopharyngeal  Result Value Ref Range Status   MRSA by PCR NEGATIVE NEGATIVE Final    Comment:        The GeneXpert MRSA  Assay (FDA approved for NASAL specimens only), is one component of a comprehensive MRSA colonization surveillance program. It is not intended to diagnose MRSA infection nor to guide or monitor treatment for MRSA infections. Performed at [facility], [address] [place], [place] [address]          Radiology Studies: EEG  Result Date: 12/22/2018 Charlsie Quest, MD     12/22/2018  4:26 PM Patient Name: MYISHIA KASIK MRN: [id number] Epilepsy Attending: Charlsie Quest Referring Physician/Provider: Dr Thana Farr Date: 12/22/2018 Duration: 25.39 mins Patient history: 85yo F with COVID+ and ams. EEG to evaluate for seizure Level of alertness: awake AEDs during EEG study: None Technical aspects: This EEG study was done with scalp electrodes positioned according to the 10-20 International system of electrode placement. Electrical activity was acquired at a sampling rate of  and reviewed with a high frequency filter of  and a low frequency filter of . EEG data were recorded continuously and digitally stored. DESCRIPTION:  During awake state, no clear posterior dominant rhythm was seen. Continuous slow 3-6Hz  theta-delta slowing was seen. Triphasic waves, generalized, maximal bifrontal were seen at times. Photic driving was not seen during photic stimulation. Hyperventilation was not performed ABNORMALITY - Continuous slow, generalized - Triphasic waves, generalized, maximal bifrontal IMPRESSION: This study is suggestive of moderate to severe diffuse encephalopathy, non specific to etiology but could be secondary to toxic-metabolic causes. No seizures or epileptiform discharges were seen throughout the recording. Charlsie Quest   US Carotid Bilateral  Result Date: 12/22/2018 CLINICAL DATA:  Cerebral infarct. EXAM: BILATERAL CAROTID DUPLEX ULTRASOUND TECHNIQUE: Wallace Cullens scale imaging, color Doppler and duplex ultrasound were performed of bilateral carotid and vertebral arteries in the neck. COMPARISON:  None. FINDINGS: Criteria: Quantification of carotid stenosis is based on velocity parameters that correlate the residual internal carotid diameter with NASCET-based stenosis levels, using the diameter of the distal internal carotid lumen as the denominator for stenosis measurement. The following velocity measurements were obtained: RIGHT ICA:  58/5 cm/sec CCA:  94/12 cm/sec SYSTOLIC ICA/CCA RATIO:  0.6 ECA:  59 cm/sec LEFT ICA:  68/13 cm/sec CCA:  55/11 cm/sec SYSTOLIC ICA/CCA RATIO:  1.4 ECA:  61 cm/sec RIGHT CAROTID ARTERY: Intimal thickening in the common carotid artery and carotid bulb. No focal plaque identified. No evidence of right ICA stenosis. RIGHT VERTEBRAL ARTERY: Antegrade flow with normal waveform and velocity. LEFT CAROTID ARTERY: Mild plaque at the level of the left carotid bulb and left ICA origin. No significant stenosis identified with estimated left ICA stenosis of less than 50%. LEFT VERTEBRAL ARTERY: Antegrade flow with normal waveform and velocity. IMPRESSION: Mild plaque at the level of the left carotid bulb  and left ICA origin. Estimated left ICA stenosis is less than 50%. Electronically Signed   By: Irish Lack M.D.   On: 12/22/2018 07:50   ECHOCARDIOGRAM COMPLETE  Result Date: 12/22/2018   ECHOCARDIOGRAM REPORT   Patient Name:   MALANIA GAWTHROP Date of Exam: 12/22/2018 Medical Rec #:  [id number]      Height:       63.0 in Accession #:    [id number]     Weight:       115.4 lb Date of Birth:  10-06-1930      BSA:          1.53 m Patient Age:    88 years       BP:           169/60  mmHg Patient Gender: F              HR:           97 bpm. Exam Location:  [facility] Procedure: 2D Echo, Color Doppler and Cardiac Doppler Indications:     Stroke 434.91  History:         Patient has prior history of Echocardiogram examinations, most                  recent 01/31/2018. Syncope and collapse, s/p TAVR.  Sonographer:     Cristela BlueJerry Hege RDCS (AE) Referring Phys:  [id number] Tresa EndoKELLY A Chrissie Dacquisto Diagnosing Phys: Lorine BearsMuhammad Arida MD  Sonographer Comments: Technically difficult study due to poor echo windows, suboptimal parasternal window, no apical window and no subcostal window. Pt had right arm in sling and large tight elastic bandage around torso. Pt could not be positioned on left side. IMPRESSIONS  1. Technically difficult study due to poor echo windows, suboptimal parasternal window, no apical window and no subcostal window  2. Left ventricular ejection fraction, by visual estimation, is 55 to 60%. The left ventricle has normal function. There is moderately increased left ventricular hypertrophy.  3. The left ventricle has no regional wall motion abnormalities.  4. Global right ventricle has normal systolic function.The right ventricular size is normal. No increase in right ventricular wall thickness.  5. Left atrial size was normal.  6. Right atrial size was not well visualized.  7. Small pericardial effusion.  8. The pericardial effusion is posterior to the left ventricle.  9. The mitral valve was not well visualized. No evidence of  mitral valve regurgitation. No evidence of mitral stenosis. 10. The tricuspid valve is normal in structure. Tricuspid valve regurgitation is not demonstrated. 11. The aortic valve was not well visualized. Aortic valve regurgitation is not visualized. 12. The pulmonic valve was normal in structure. Pulmonic valve regurgitation is not visualized. 13. Left ventricular diastolic function could not be evaluated. 14. TR signal is inadequate for assessing pulmonary artery systolic pressure. 15. The aortic root was not well visualized. FINDINGS  Left Ventricle: Left ventricular ejection fraction, by visual estimation, is 55 to 60%. The left ventricle has normal function. The left ventricle has no regional wall motion abnormalities. There is moderately increased left ventricular hypertrophy. Left ventricular diastolic function could not be evaluated. Normal left atrial pressure. Right Ventricle: The right ventricular size is normal. No increase in right ventricular wall thickness. Global RV systolic function is has normal systolic function. Left Atrium: Left atrial size was normal in size. Right Atrium: Right atrial size was not well visualized Pericardium: A small pericardial effusion is present. The pericardial effusion is posterior to the left ventricle. Mitral Valve: The mitral valve was not well visualized. No evidence of mitral valve regurgitation. No evidence of mitral valve stenosis by observation. Tricuspid Valve: The tricuspid valve is normal in structure. Tricuspid valve regurgitation is not demonstrated. Aortic Valve: The aortic valve was not well visualized. Aortic valve regurgitation is not visualized. Pulmonic Valve: The pulmonic valve was normal in structure. Pulmonic valve regurgitation is not visualized. Pulmonic regurgitation is not visualized. Aorta: The aortic root, ascending aorta and aortic arch are all structurally normal, with no evidence of dilitation or obstruction and the aortic root was not well  visualized. Venous: The inferior vena cava was not well visualized. IAS/Shunts: No atrial level shunt detected by color flow Doppler. There is no evidence of a patent foramen ovale. No ventricular septal defect is seen  or detected. There is no evidence of an atrial septal defect.  LEFT VENTRICLE PLAX 2D LVIDd:         3.41 cm LVIDs:         2.35 cm LV PW:         1.21 cm LV IVS:        1.63 cm LVOT diam:     2.00 cm LV SV:         29 ml LV SV Index:   18.79 LVOT Area:     3.14 cm  LEFT ATRIUM         Index LA diam:    3.30 cm 2.16 cm/m   AORTA Ao Root diam: 2.20 cm  SHUNTS Systemic Diam: 2.00 cm  Lorine Bears MD Electronically signed by Lorine Bears MD Signature Date/Time: 12/22/2018/2:34:30 PM    Final         Scheduled Meds: . vitamin C  500 mg Oral Daily  . enoxaparin (LOVENOX) injection  40 mg Subcutaneous Q24H  . levothyroxine  25 mcg Intravenous Daily  . thiamine  100 mg Oral Daily   Continuous Infusions: . dextrose 5 % with kcl 100 mL/hr at 12/23/18 0419  . famotidine (PEPCID) IV 20 mg (12/22/18 1152)     LOS: 4 days    Time spent: 35-40 minutes    Pennie Banter, DO [facility]   If 7PM-7AM, please contact night-coverage [email or web address] Password [email or web address] 12/23/2018, 8:02 AM

## 2018-12-23 NOTE — Progress Notes (Signed)
SLP Cancellation Note  Patient Details Name: Kelsey Le MRN: [id number] DOB: 1930-03-13   Cancelled treatment:       Reason Eval/Treat Not Completed: Patient not medically ready;Patient's level of consciousness(chart reviewed; consulted NSG re: pt's status) NSG reported pt remains less responsive and not alerting sufficiently to engage or follow instructions; not safe for oral intake at this time.  ST services will hold on BSE d/t increased risk for aspiration if pt is not fully alert/engaged for tasks of po intake. Recommend frequent oral care for hygiene and stimulation of swallowing; aspiration precautions. ST services will be available if any changes; NSG to reconsult.     Orinda Kenner, MS, CCC-SLP Donja Tipping 12/23/2018, 1:31 PM

## 2018-12-24 ENCOUNTER — Other Ambulatory Visit: Payer: Self-pay

## 2018-12-24 DIAGNOSIS — Z7189 Other specified counseling: Secondary | ICD-10-CM

## 2018-12-24 DIAGNOSIS — I639 Cerebral infarction, unspecified: Secondary | ICD-10-CM | POA: Diagnosis present

## 2018-12-24 LAB — COMPREHENSIVE METABOLIC PANEL
ALT: 16 U/L (ref 0–44)
AST: 25 U/L (ref 15–41)
Albumin: 3.3 g/dL — ABNORMAL LOW (ref 3.5–5.0)
Alkaline Phosphatase: 106 U/L (ref 38–126)
Anion gap: 13 (ref 5–15)
BUN: 12 mg/dL (ref 8–23)
CO2: 19 mmol/L — ABNORMAL LOW (ref 22–32)
Calcium: 8.6 mg/dL — ABNORMAL LOW (ref 8.9–10.3)
Chloride: 105 mmol/L (ref 98–111)
Creatinine, Ser: 0.48 mg/dL (ref 0.44–1.00)
GFR calc Af Amer: >60 mL/min (ref >60)
GFR calc non Af Amer: >60 mL/min (ref >60)
Glucose, Bld: 81 mg/dL (ref 70–99)
Potassium: 3.9 mmol/L (ref 3.5–5.1)
Sodium: 137 mmol/L (ref 135–145)
Total Bilirubin: 1.5 mg/dL — ABNORMAL HIGH (ref 0.3–1.2)
Total Protein: 6.8 g/dL (ref 6.5–8.1)

## 2018-12-24 LAB — CBC WITH DIFFERENTIAL/PLATELET
Abs Immature Granulocytes: 0.06 10*3/uL (ref 0.00–0.07)
Basophils Absolute: 0.1 10*3/uL (ref 0.0–0.1)
Basophils Relative: 1 %
Eosinophils Absolute: 0.1 10*3/uL (ref 0.0–0.5)
Eosinophils Relative: 1 %
HCT: 29.7 % — ABNORMAL LOW (ref 36.0–46.0)
Hemoglobin: 9.7 g/dL — ABNORMAL LOW (ref 12.0–15.0)
Immature Granulocytes: 1 %
Lymphocytes Relative: 11 %
Lymphs Abs: 0.9 10*3/uL (ref 0.7–4.0)
MCH: 26.3 pg (ref 26.0–34.0)
MCHC: 32.7 g/dL (ref 30.0–36.0)
MCV: 80.5 fL (ref 80.0–100.0)
Monocytes Absolute: 0.8 10*3/uL (ref 0.1–1.0)
Monocytes Relative: 9 %
Neutro Abs: 6.8 10*3/uL (ref 1.7–7.7)
Neutrophils Relative %: 77 %
Platelets: 192 10*3/uL (ref 150–400)
RBC: 3.69 MIL/uL — ABNORMAL LOW (ref 3.87–5.11)
RDW: 21.9 % — ABNORMAL HIGH (ref 11.5–15.5)
Smear Review: UNDETERMINED
WBC: 8.7 10*3/uL (ref 4.0–10.5)
nRBC: 0 % (ref 0.0–0.2)

## 2018-12-24 LAB — MAGNESIUM: Magnesium: 2 mg/dL (ref 1.7–2.4)

## 2018-12-24 MED ORDER — BIOTENE DRY MOUTH MT LIQD: 15.0000 mL | OROMUCOSAL | Status: DC | PRN

## 2018-12-24 MED ORDER — MORPHINE SULFATE (PF) 2 MG/ML IV SOLN: 2.0000 mg | INTRAVENOUS | Status: DC | PRN

## 2018-12-24 MED ORDER — HALOPERIDOL LACTATE 2 MG/ML PO CONC
0.5000 mg | ORAL | Status: DC | PRN
  Filled 2018-12-24: qty 0.3

## 2018-12-24 MED ORDER — GLYCOPYRROLATE 0.2 MG/ML IJ SOLN
0.2000 mg | INTRAMUSCULAR | Status: DC | PRN
  Filled 2018-12-24: qty 1

## 2018-12-24 MED ORDER — LORAZEPAM 2 MG/ML PO CONC
1.0000 mg | ORAL | Status: DC | PRN
  Filled 2018-12-24: qty 0.5

## 2018-12-24 MED ORDER — ONDANSETRON HCL 4 MG/2ML IJ SOLN: 4.0000 mg | Freq: Four times a day (QID) | INTRAMUSCULAR | Status: DC | PRN

## 2018-12-24 MED ORDER — HALOPERIDOL 0.5 MG PO TABS
0.5000 mg | ORAL_TABLET | ORAL | Status: DC | PRN
  Filled 2018-12-24: qty 1

## 2018-12-24 MED ORDER — POLYVINYL ALCOHOL 1.4 % OP SOLN: 1.0000 [drp] | Freq: Four times a day (QID) | OPHTHALMIC | Status: DC | PRN

## 2018-12-24 MED ORDER — HALOPERIDOL LACTATE 5 MG/ML IJ SOLN: 0.5000 mg | INTRAMUSCULAR | Status: DC | PRN

## 2018-12-24 MED ORDER — LORAZEPAM 1 MG PO TABS: 1.0000 mg | ORAL_TABLET | ORAL | Status: DC | PRN

## 2018-12-24 MED ORDER — LORAZEPAM 2 MG/ML IJ SOLN: 0.5000 mg | INTRAMUSCULAR | Status: DC | PRN

## 2018-12-24 MED ORDER — ONDANSETRON 4 MG PO TBDP
4.0000 mg | ORAL_TABLET | Freq: Four times a day (QID) | ORAL | Status: DC | PRN
  Filled 2018-12-24: qty 1

## 2018-12-24 MED ORDER — SODIUM CHLORIDE 0.9% FLUSH
3.0000 mL | Freq: Two times a day (BID) | INTRAVENOUS | Status: DC
  Administered 2018-12-24 (×2): 3 mL via INTRAVENOUS

## 2018-12-24 MED ORDER — GLYCOPYRROLATE 1 MG PO TABS
1.0000 mg | ORAL_TABLET | ORAL | Status: DC | PRN
  Filled 2018-12-24: qty 1

## 2018-12-24 MED ORDER — SODIUM CHLORIDE 0.9% FLUSH: 3.0000 mL | INTRAVENOUS | Status: DC | PRN

## 2018-12-24 NOTE — Care Management Important Message (Signed)
Important Message  Patient Details  Name: Kelsey Le MRN: [id number] Date of Birth: Sep 30, 1930   Medicare Important Message Given:  Yes     Juliann Pulse A Skye Rodarte 12/24/2018, 11:27 AM

## 2018-12-24 NOTE — Progress Notes (Addendum)
PROGRESS NOTE    Kelsey Le  XVQ:[id number] DOB: Mar 12, 1930 DOA: 12/19/2018  PCP: Idelle Crouch, MD    LOS - 5   Brief Narrative:  85 y.o.female, ALF resident,with medical history significant ofdiastolic CHF,pulmonary embolism on chronic anticoagulation,dementia, recurrent AVMs, asthma, iron deficiency anemia,recurrent UTI's,who has had recent recurrentfalls at the facility including right humeral shaft fracture currently in immobilizer.Brought to ED on 12/18 due to worsening mental status. In the ED, afebrile, mildly hypertensive, otherwise normal vitals. Labs were notable for AKI and dehydration, stable anemia. UA negative for UTI. CXR negative. Head CT without acute findings. Patient treated with IV fluids and admitted for further management. MRI subsequently showed R parietal acute to early subacute infarct.  Patient has remained minimally responsive, often somnolent, does not appear to recognize daughter and not able to follow commands.  At baseline, patient's dementia is quite mild, per daughter.  Palliative care team consulted, met with family 12/23 and decision was made to transition to comfort care.    Subjective 12/23: Patient sleeping comfortably.  Remains unresponsive.  Appears in no distress.  No acute events reported.  Assessment & Plan:   Principal Problem:   Acute metabolic encephalopathy Active Problems:   Hypernatremia   Stroke (HCC)   Iron deficiency anemia   S/P TAVR (transcatheter aortic valve replacement)   Dementia with psychosis (HCC)   Dehydration   Right humeral fracture   Acute renal failure (ARF) (HCC)   Acute metabolic encephalopathy-suspect this ismultifactorialsecondary tostroke,dehydration and acute renal failure, possible recent Covid contributing, hypoactive delirium in setting of underlying dementia. Urinalysis ruledout UTI. Glucose has been within normal limits. Does not appear to be on medications outpatient that  would cause this. MRI obtained and showed acute/early subacute right parietal infarct.Suspect some component of hypoactive delirium given underlying dementia.  --comfort care  Acute / Subacute CVA- present on admission MRI showed R parietal infarct.No known hx of A-fib per daughter. Possibly has been hypercoaguable w recent Covid-19 infection, but has been on Eliquis (at low dose 2.64m due to hx of bleeding). Carotid dopplers without significant stenosis. Echo showed EF 55-60%.  Stopping Eliquis now on comfort care.  Hypernatremia-resolved.  Sodium 142 on admission, peaked at 148, normal at 140 after D5W given.  --no further labs, comfort care   Acute kidney injury,present on admission.Resolved with IV hydration. Prerenal azotemia due to poor p.o. intake.   Dehydration-due to poor p.o. intake,causing AKI and hypernatremia, and likely altered mental status. Could be due to advancing dementia versus recent illnesses including Covid and UTI,or multifactorial.  Iron deficiency anemia-chronic, stable No evidence of active bleeding. Baseline hemoglobin appears to be around 9-10.  Dementia without behavioral disturbance-currently altered from her baseline as outlined above.  Progressive this year without visitors.  Right humeral shaft fracture -treat for any signs of pain or discomfort per comfort care orders  History of aortic valve replacement/ TAVR-stable.  History of PE on chronic anticoagulation-acute PE diagnosed in 2017, on Eliquis since that time. --stopping Eliquis, comfort care   DVT prophylaxis: None, comfort care   Code Status: DNR  Family Communication: daughter at bedside updated 12/23  Disposition Plan:  Likely to residential hospice   Consultants:   Neurology  Comfort Care  Procedures: Include things that cannot be auto populated i.e. Echo, Carotid and venous dopplers, Foley, Bipap, HD, tubes/drains, wound vac, central lines etc)   Echo EF 55-60%  Carotid dopplers 12/20   Antimicrobials:   None    Objective: Vitals:  12/23/18 1534 12/23/18 2307 12/24/18 0000 12/24/18 0743  BP: (!) 148/65  (!) 179/79 (!) 156/74  Pulse: 87 (!) 106 (!) 104 (!) 101  Resp: 17 16  17   Temp: 98.2 F (36.8 C) 98.7 F (37.1 C)  98.4 F (36.9 C)  TempSrc: Axillary Oral  Axillary  SpO2: 98% 97%  98%  Weight:      Height:        Intake/Output Summary (Last 24 hours) at 12/24/2018 1410 Last data filed at 12/24/2018 0700 Gross per 24 hour  Intake 742.71 ml  Output 350 ml  Net 392.71 ml   Filed Weights   12/19/18 1312 12/20/18 0119  Weight: 56.7 kg 52.3 kg    Examination:  General exam: sleeping, no acute distress Respiratory system: clear to auscultation bilaterally, no wheezes, rales or rhonchi, normal respiratory effort. Cardiovascular system: normal S1/S2, RRR, no JVD, murmurs, rubs, gallops, no pedal edema.   Gastrointestinal system: soft, non-tender Central nervous system: unresponsive. Does not follow commands Skin: dry, intact, normal temperature, resolving ecchymosis on right chest wall     Data Reviewed: I have personally reviewed following labs and imaging studies  CBC: Recent Labs  Lab 12/19/18 1316 12/20/18 0424 12/22/18 0429 12/23/18 0451 12/24/18 0521  WBC 8.7 6.8 6.6 7.5 8.7  NEUTROABS  --   --  4.9 5.4 6.8  HGB 8.9* 8.2* 8.0* 8.7* 9.7*  HCT 28.5* 28.1* 27.1* 29.6* 29.7*  MCV 82.4 87.8 87.4 88.1 80.5  PLT 300 337 255 236 259   Basic Metabolic Panel: Recent Labs  Lab 12/20/18 0424 12/20/18 1244 12/22/18 0429 12/23/18 0451 12/24/18 0909  NA 146* 147* 148* 140 137  K 4.0 3.9 3.2* 4.6 3.9  CL 115* 115* 115* 108 105  CO2 19* 19* 24 22 19*  GLUCOSE 72 75 100* 105* 81  BUN 45* 36* 17 16 12   CREATININE 0.92 0.74 0.67 0.75 0.48  CALCIUM 8.8* 8.9 8.9 8.6* 8.6*  MG  --   --  1.8 1.8 2.0   GFR: Estimated Creatinine Clearance: 41.7 mL/min (by C-G formula based on SCr of 0.48 mg/dL).  Liver Function Tests: Recent Labs  Lab 12/19/18 1316 12/20/18 0424 12/22/18 0429 12/23/18 0451 12/24/18 0909  AST 28 21 21 21 25   ALT 17 13 12 13 16   ALKPHOS 82 76 76 88 106  BILITOT 1.2 1.2 1.3* 1.4* 1.5*  PROT 7.5 6.7 6.1* 6.3* 6.8  ALBUMIN 3.8 3.4* 3.0* 3.0* 3.3*   No results for input(s): LIPASE, AMYLASE in the last 168 hours. Recent Labs  Lab 12/20/18 1244  AMMONIA 27   Coagulation Profile: No results for input(s): INR, PROTIME in the last 168 hours. Cardiac Enzymes: No results for input(s): CKTOTAL, CKMB, CKMBINDEX, TROPONINI in the last 168 hours. BNP (last 3 results) No results for input(s): PROBNP in the last 8760 hours. HbA1C: No results for input(s): HGBA1C in the last 72 hours. CBG: No results for input(s): GLUCAP in the last 168 hours. Lipid Profile: Recent Labs    12/22/18 0429  CHOL 191  HDL 39*  LDLCALC 132*  TRIG 99  CHOLHDL 4.9   Thyroid Function Tests: No results for input(s): TSH, T4TOTAL, FREET4, T3FREE, THYROIDAB in the last 72 hours. Anemia Panel: No results for input(s): VITAMINB12, FOLATE, FERRITIN, TIBC, IRON, RETICCTPCT in the last 72 hours. Sepsis Labs: Recent Labs  Lab 12/21/18 0843  PROCALCITON <0.10    Recent Results (from the past 240 hour(s))  Urine culture     Status:  None   Collection Time: 12/19/18  5:39 PM   Specimen: Urine, Catheterized  Result Value Ref Range Status   Specimen Description   Final    URINE, CATHETERIZED Performed at [facility], [address], [place], [place] [address]    Special Requests   Final    NONE Performed at [facility], [address], [place], [place] [address]    Culture   Final    NO GROWTH Performed at [facility], [place] [address] [place], [place] [address]    Report Status 12/21/2018 FINAL  Final  SARS CORONAVIRUS 2 (TAT 6-24 HRS) Nasopharyngeal Nasopharyngeal Swab     Status: Abnormal   Collection Time: 12/19/18 10:54 PM   Specimen:  Nasopharyngeal Swab  Result Value Ref Range Status   SARS Coronavirus 2 POSITIVE (A) NEGATIVE Final    Comment: RESULT CALLED TO, READ BACK BY AND VERIFIED WITH: JADIE COHEN RN.@[email or web address]  ON 12.19.2020 BY TCALDWELL MT. (NOTE) SARS-CoV-2 target nucleic acids are DETECTED. The SARS-CoV-2 RNA is generally detectable in upper and lower respiratory specimens during the acute phase of infection. Positive results are indicative of the presence of SARS-CoV-2 RNA. Clinical correlation with patient history and other diagnostic information is  necessary to determine patient infection status. Positive results do not rule out bacterial infection or co-infection with other viruses.  The expected result is Negative. Fact Sheet for Patients: SugarRoll.be Fact Sheet for Healthcare Providers: https://www.woods-mathews.com/ This test is not yet approved or cleared by the [place] FDA and  has been authorized for detection and/or diagnosis of SARS-CoV-2 by FDA under an Emergency Use Authorization (EUA). This EUA will remain  in effect (meaning this test can  be used) for the duration of the COVID-19 declaration under Section 564(b)(1) of the Act, 21 U.S.C. section 360bbb-3(b)(1), unless the authorization is terminated or revoked sooner. Performed at [facility], [place] [address], [place], [place] [address]   MRSA PCR Screening     Status: None   Collection Time: 12/20/18  1:27 AM   Specimen: Nasopharyngeal  Result Value Ref Range Status   MRSA by PCR NEGATIVE NEGATIVE Final    Comment:        The GeneXpert MRSA Assay (FDA approved for NASAL specimens only), is one component of a comprehensive MRSA colonization surveillance program. It is not intended to diagnose MRSA infection nor to guide or monitor treatment for MRSA infections. Performed at [facility], [address], [place],  [address]          Radiology Studies: EEG   Result Date: 12/22/2018 Lora Havens, MD     12/22/2018  4:26 PM Patient Name: VALINE DROZDOWSKI MRN: [id number] Epilepsy Attending: Lora Havens Referring Physician/Provider: Dr Alexis Goodell Date: 12/22/2018 Duration: 25.39 mins Patient history: 85yo F with COVID+ and ams. EEG to evaluate for seizure Level of alertness: awake AEDs during EEG study: None Technical aspects: This EEG study was done with scalp electrodes positioned according to the 10-20 International system of electrode placement. Electrical activity was acquired at a sampling rate of 500Hz  and reviewed with a high frequency filter of 70Hz  and a low frequency filter of 1Hz . EEG data were recorded continuously and digitally stored. DESCRIPTION: During awake state, no clear posterior dominant rhythm was seen. Continuous slow 3-6Hz  theta-delta slowing was seen. Triphasic waves, generalized, maximal bifrontal were seen at times. Photic driving was not seen during photic stimulation. Hyperventilation was not performed ABNORMALITY - Continuous slow,  generalized - Triphasic waves, generalized, maximal bifrontal IMPRESSION: This study is suggestive of moderate to severe diffuse encephalopathy, non specific to etiology but could be secondary to toxic-metabolic causes. No seizures or epileptiform discharges were seen throughout the recording. Priyanka Barbra Sarks        Scheduled Meds: . modafinil  100 mg Oral Daily  . sodium chloride flush  3 mL Intravenous Q12H   Continuous Infusions: . sodium chloride 50 mL/hr at 12/23/18 1834  . famotidine (PEPCID) IV 20 mg (12/24/18 0922)     LOS: 5 days    Time spent: 40-45 minutes    Ezekiel Slocumb, DO [facility]   If 7PM-7AM, please contact night-coverage [email or web address] Password [email or web address] 12/24/2018, 2:10 PM

## 2018-12-24 NOTE — Progress Notes (Signed)
New referral for [facility] home received from [place]. Approval has been received the hospice medical director. Patient information faxed to referral. Writer met in the room with patient's daughter Juliann Pulse and son [name] to initiate education regarding hospice services, philosophy, team approach to care and current visitation policy with understanding voiced. Plan is for transfer to the hospice home tomorrow via EMS with signed out of facility DNR in place. Hopsital care team made aware. Writer to call report and set up transport 12/24 am. Flo Shanks BSN, RN, [place] [phone]

## 2018-12-24 NOTE — Consult Note (Addendum)
Consultation Note Date: 12/24/2018   Patient Name: Kelsey Le  DOB: [birth year]  MRN: [id number]  Age / Sex: 85 y.o., female  PCP: Idelle Crouch, MD Referring Physician: Ezekiel Slocumb, DO  Reason for Consultation: Establishing goals of care  HPI/Patient Profile:  Kelsey Le is a 85 y.o. female with medical history significant of atrial fibrillation, diastolic dysfunction CHF, CVA, dementia, recurrent AVMs, asthma, iron deficiency anemia, who has had recent falls at the facility including right humeral shaft fracture currently in immobilizer.  Daughter visited day of admission and found patient to have had worsening mental status.  She was able to talk to the patient the day before,  but today she was unable to do so.    Clinical Assessment and Goals of Care: Patient is resting in bed. Patient does not respond to voice or touch. Daughter is at bedside.  Daughter had her brother, the patient's son on the phone. They state they have spoken with a couple of physicians, and do not believe they would want a feeding tube. They discuss in great detail her decline since March discussing Covid, a CVA, falls with an arm fracture, UTI, and dehydration. They discuss cognitive decline as well with not being able to have visitors. Prior to March, she used a walker, had a great appetite, and could converse well. She has developed poor PO intake and has lost around 12 pounds in 1 month per family.    We discussed her diagnosis, prognosis, GOC, EOL wishes disposition and options.  A detailed discussion was had today regarding advanced directives.  Concepts specific to code status, artifical feeding and hydration, IV antibiotics and rehospitalization were discussed.  The difference between an aggressive medical intervention path and a comfort care path was discussed.  Values and goals of care important to patient and  family were attempted to be elicited.  Discussed limitations of medical interventions to prolong quality of life in some situations and discussed the concept of human mortality.  The family understands that quality of life and particularly independence is important to their mother. They discuss that at her current facility, she has been upset with needing a sitter as per family she stated she did not need a Public librarian. She also has been asking where her car is.   Family would like to transition to full comfort care. Discussed referral for hospice facility.   I completed a MOST form today and the signed original was placed in the chart. A photocopy was placed in the chart to be scanned into EMR. The patient's son and daughter outlined their wishes for the following treatment decisions:  Cardiopulmonary Resuscitation: Do Not Attempt Resuscitation (DNR/No CPR)  Medical Interventions: Comfort Measures: Keep clean, warm, and dry. Use medication by any route, positioning, wound care, and other measures to relieve pain and suffering. Use oxygen, suction and manual treatment of airway obstruction as needed for comfort. Do not transfer to the hospital unless comfort needs cannot be met in current location.  Antibiotics: No  antibiotics (use other measures to relieve symptoms)  IV Fluids: No IV fluids (provide other measures to ensure comfort)  Feeding Tube: No feeding tube     SUMMARY OF RECOMMENDATIONS   Shift to full comfort care. Hospice facility recommended.   Prognosis:   < 2 weeks Does not respond to voice or touch. No PO intake currently. Poor PO intake previously. Dementia. CVA 12/19 stopping anticoagulation. Right humerus fx from fall 12/8.     Primary Diagnoses: Present on Admission: . (Resolved) AMS (altered mental status) . Dementia with psychosis ([place]) . Iron deficiency anemia . Dehydration . Right humeral fracture . Acute renal failure (ARF) ( [place]) . Acute metabolic  encephalopathy   I have reviewed the medical record, interviewed the patient and family, and examined the patient. The following aspects are pertinent.  Past Medical History:  Diagnosis Date  . Anxiety   . Asthma   . AVM (arteriovenous malformation)    a. small intestine with recurrent GI bleeding.   . B12 deficiency   . Chronic diastolic CHF (congestive heart failure) ([place])   . Hip fracture ([place])    a. in the setting of mechanical fall  . Iron deficiency   . Iron deficiency anemia    a. requiring periodic transfusions  . Psychosis ([place])   . S/P TAVR (transcatheter aortic valve replacement)    a. 02/12/17: s/p TAVR w/ an Edwards Sapien 3 THV (size 23 mm, model # 9600TFX, serial # [id number])  . Severe aortic stenosis   . Syncope and collapse   . Thyroid disease    Social History   Socioeconomic History  . Marital status: Married    Spouse name: Not on file  . Number of children: Not on file  . Years of education: Not on file  . Highest education level: Not on file  Occupational History  . Not on file  Tobacco Use  . Smoking status: Never Smoker  . Smokeless tobacco: Never Used  Substance and Sexual Activity  . Alcohol use: No  . Drug use: No  . Sexual activity: Not Currently  Other Topics Concern  . Not on file  Social History Narrative  . Not on file   Social Determinants of Health   Financial Resource Strain:   . Difficulty of Paying Living Expenses: Not on file  Food Insecurity:   . Worried About Charity fundraiser in the Last Year: Not on file  . Ran Out of Food in the Last Year: Not on file  Transportation Needs:   . Lack of Transportation (Medical): Not on file  . Lack of Transportation (Non-Medical): Not on file  Physical Activity:   . Days of Exercise per Week: Not on file  . Minutes of Exercise per Session: Not on file  Stress:   . Feeling of Stress : Not on file  Social Connections:   . Frequency of Communication with Friends and Family: Not on  file  . Frequency of Social Gatherings with Friends and Family: Not on file  . Attends Religious Services: Not on file  . Active Member of Clubs or Organizations: Not on file  . Attends Archivist Meetings: Not on file  . Marital Status: Not on file   Family History  Problem Relation Age of Onset  . Heart block Mother   . Heart failure Father   . Heart disease Sister   . Breast cancer Maternal Aunt    Scheduled Meds: . levothyroxine  25 mcg Intravenous Daily  .  modafinil  100 mg Oral Daily  . sodium chloride flush  3 mL Intravenous Q12H   Continuous Infusions: . sodium chloride 50 mL/hr at 12/23/18 1834  . famotidine (PEPCID) IV 20 mg (12/24/18 0922)   PRN Meds:.acetaminophen **OR** acetaminophen, antiseptic oral rinse, glycopyrrolate **OR** glycopyrrolate **OR** glycopyrrolate, haloperidol **OR** haloperidol **OR** haloperidol lactate, LORazepam **OR** LORazepam **OR** LORazepam, morphine injection, ondansetron **OR** ondansetron (ZOFRAN) IV, polyvinyl alcohol, sodium chloride flush Medications Prior to Admission:  Prior to Admission medications   Medication Sig Start Date End Date Taking? Authorizing Provider  acetaminophen (TYLENOL) 325 MG tablet Take 650 mg by mouth every 6 (six) hours as needed.   Yes [provider]  Acetaminophen 500 MG coapsule Take 500 mg by mouth 3 (three) times daily.    Yes [provider]  apixaban (ELIQUIS) 2.5 MG TABS tablet Take 1 tablet (2.5 mg total) by mouth 2 (two) times daily. 10/02/18  Yes Wellington Hampshire, MD  Calcium Carbonate-Vitamin D (CALCIUM-VITAMIN D) 600-125 MG-UNIT TABS Take by mouth daily.   Yes [provider]  Cholecalciferol (VITAMIN D-1000 MAX ST) 25 MCG (1000 UT) tablet TAKE (1) TABLET BY MOUTH ONCE A DAY 07/17/18  Yes [provider]  clobetasol cream (TEMOVATE) 2.95 % Apply 1 application topically See admin instructions. Apply small amount vaginally on Monday and Friday   Yes [provider]  estradiol (ESTRACE) 0.1 MG/GM vaginal cream Place 1 Applicatorful vaginally See admin instructions. Apply 0.5 grams vaginally Tuesday and Thursday morning   Yes [provider]  furosemide (LASIX) 20 MG tablet Take 1 tablet (20 mg total) by mouth daily. 02/21/17 12/19/18 Yes Eileen Stanford, PA-C  iron polysaccharides (NIFEREX) 150 MG capsule Take 150 mg by mouth daily.   Yes [provider]  levothyroxine (SYNTHROID, LEVOTHROID) 50 MCG tablet Take 50 mcg by mouth daily before breakfast.    Yes [provider]  loperamide (IMODIUM) 2 MG capsule Take 2 mg by mouth 2 (two) times daily.   Yes [provider]  loratadine (CLARITIN) 10 MG tablet Take 10 mg by mouth daily.  02/26/17 12/19/18 Yes [provider]  memantine (NAMENDA) 10 MG tablet Take 10 mg by mouth 2 (two) times daily.  07/11/17  Yes [provider]  nitrofurantoin (MACRODANTIN) 50 MG capsule Take 1 capsule (50 mg total) by mouth at bedtime. 06/19/18  Yes Lavella Hammock, MD  nystatin (NYSTATIN) powder Apply 1 application topically 2 (two) times daily as needed.   Yes [provider]  OLANZapine (ZYPREXA) 10 MG tablet Take 10 mg by mouth at bedtime.   Yes [provider]  omeprazole (PRILOSEC) 20 MG capsule Take 1 capsule (20 mg total) by mouth daily. 02/21/17  Yes Jonathon Bellows, MD  polyvinyl alcohol (LIQUIFILM TEARS) 1.4 % ophthalmic solution Place 1 drop into both eyes as needed for dry eyes.   Yes [provider]  sennosides-docusate sodium (SENOKOT-S) 8.6-50 MG tablet Take 1 tablet by mouth daily.   Yes [provider]  sertraline (ZOLOFT) 50 MG tablet Take 1 tablet (50 mg total) by mouth daily. 06/19/18  Yes Lavella Hammock, MD  vitamin B-12 (CYANOCOBALAMIN) 100 MCG tablet Take 1,000 mcg by mouth daily.    Yes [provider]  clindamycin (CLEOCIN) 150 MG capsule Take 150 mg by mouth 3 (three) times daily. As needed for  dental procedures 09/25/18   [provider]  sulfamethoxazole-trimethoprim (BACTRIM DS) 800-160 MG tablet Take 1 tablet by mouth 2 (two)  times daily.    [provider]   Allergies  Allergen Reactions  . Aspirin Other (See Comments)    GI bleeding   . Penicillins Rash and Other (See Comments)    PATIENT HAS HAD A PCN REACTION WITH IMMEDIATE RASH, FACIAL/TONGUE/THROAT SWELLING, SOB, OR LIGHTHEADEDNESS WITH HYPOTENSION:  #  #  #  YES  #  #  #   Has patient had a PCN reaction causing severe rash involving mucus membranes or skin necrosis: No Has patient had a PCN reaction that required hospitalization: No Has patient had a PCN reaction occurring within the last 10 years: No    Review of Systems  Unable to perform ROS   Physical Exam Constitutional:      Comments: Eyes closed.   Pulmonary:     Effort: Pulmonary effort is normal.     Vital Signs: BP (!) 156/74 (BP Location: Left Arm)   Pulse (!) 101   Temp 98.4 F (36.9 C) (Axillary)   Resp 17   Ht _0  (1.6 m)   Wt 52.3 kg   SpO2 98%   BMI 20.44 kg/m  Pain Scale: Faces   Pain Score: 0-No pain   SpO2: SpO2: 98 % O2 Device:SpO2: 98 % O2 Flow Rate: .   IO: Intake/output summary:   Intake/Output Summary (Last 24 hours) at 12/24/2018 1314 Last data filed at 12/24/2018 0700 Gross per 24 hour  Intake 1132.35 ml  Output 350 ml  Net 782.35 ml    LBM: Last BM Date: 12/23/18 Baseline Weight: Weight: 56.7 kg Most recent weight: Weight: 52.3 kg     Palliative Assessment/Data: 10%     Time In: 10:50 Time Out: 11:30 Time Total: 1 hour 40 min Greater than 50%  of this time was spent counseling and coordinating care related to the above assessment and plan.  Signed by: Asencion Gowda, NP   Please contact Palliative Medicine Team phone at [phone] for questions and concerns.  For individual provider: See Shea Evans

## 2018-12-24 NOTE — Progress Notes (Signed)
Subjective: No change overnight. Patient sleepy, difficulty to arouse. Minimal response to stimulus  Past Medical History:  Diagnosis Date  . Anxiety   . Asthma   . AVM (arteriovenous malformation)    a. small intestine with recurrent GI bleeding.   . B12 deficiency   . Chronic diastolic CHF (congestive heart failure) (HCC)   . Hip fracture (HCC)    a. in the setting of mechanical fall  . Iron deficiency   . Iron deficiency anemia    a. requiring periodic transfusions  . Psychosis (HCC)   . S/P TAVR (transcatheter aortic valve replacement)    a. 02/12/17: s/p TAVR w/ an Edwards Sapien 3 THV (size 23 mm, model # 9600TFX, serial # [id number])  . Severe aortic stenosis   . Syncope and collapse   . Thyroid disease     Past Surgical History:  Procedure Laterality Date  . APPENDECTOMY    . BACK SURGERY     back fusion  . CARDIAC CATHETERIZATION    . ESOPHAGOGASTRODUODENOSCOPY (EGD) WITH PROPOFOL N/A 11/15/2015   Procedure: ESOPHAGOGASTRODUODENOSCOPY (EGD) WITH PROPOFOL;  Surgeon: [name], MD;  Location: [facility] ENDOSCOPY;  Service: Endoscopy;  Laterality: N/A;  . ESOPHAGOGASTRODUODENOSCOPY (EGD) WITH PROPOFOL N/A 10/12/2016   Procedure: ESOPHAGOGASTRODUODENOSCOPY (EGD) WITH PROPOFOL;  Surgeon: [name], MD;  Location: [facility] ENDOSCOPY;  Service: Endoscopy;  Laterality: N/A;  . FEMUR SURGERY     broken with rod  . FRACTURE SURGERY    . GIVENS CAPSULE STUDY N/A 02/20/2016   Procedure: GIVENS CAPSULE STUDY;  Surgeon: Scot Jun, MD;  Location: [facility] ENDOSCOPY;  Service: Endoscopy;  Laterality: N/A;  . HIP FRACTURE SURGERY    . INTRAMEDULLARY (IM) NAIL INTERTROCHANTERIC Right 11/18/2015   Procedure: INTRAMEDULLARY (IM) NAIL INTERTROCHANTRIC;  Surgeon: Juanell Fairly, MD;  Location: [facility] ORS;  Service: Orthopedics;  Laterality: Right;  . KYPHOPLASTY N/A 08/19/2014   Procedure: KYPHOPLASTY;  Surgeon: Kennedy Bucker, MD;  Location: [facility] ORS;  Service: Orthopedics;  Laterality: N/A;  .  PARTIAL HYSTERECTOMY    . RIGHT/LEFT HEART CATH AND CORONARY ANGIOGRAPHY N/A 10/05/2016   Procedure: RIGHT/LEFT HEART CATH AND CORONARY ANGIOGRAPHY;  Surgeon: Kathleene Hazel, MD;  Location: [facility] INVASIVE CV LAB;  Service: Cardiovascular;  Laterality: N/A;  . TEE WITHOUT CARDIOVERSION N/A 02/12/2017   Procedure: TRANSESOPHAGEAL ECHOCARDIOGRAM (TEE);  Surgeon: Kathleene Hazel, MD;  Location: [facility] OR;  Service: Open Heart Surgery;  Laterality: N/A;  . TRANSCATHETER AORTIC VALVE REPLACEMENT, TRANSFEMORAL N/A 02/12/2017   Procedure: TRANSCATHETER AORTIC VALVE REPLACEMENT, TRANSFEMORAL using a 23mm Edwards Sapien 3 Aortic Valve;  Surgeon: Kathleene Hazel, MD;  Location: [facility] OR;  Service: Open Heart Surgery;  Laterality: N/A;  . TUBAL LIGATION      Family History  Problem Relation Age of Onset  . Heart block Mother   . Heart failure Father   . Heart disease Sister   . Breast cancer Maternal Aunt     Social History:  reports that she has never smoked. She has never used smokeless tobacco. She reports that she does not drink alcohol or use drugs.  Allergies  Allergen Reactions  . Aspirin Other (See Comments)    GI bleeding   . Penicillins Rash and Other (See Comments)    PATIENT HAS HAD A PCN REACTION WITH IMMEDIATE RASH, FACIAL/TONGUE/THROAT SWELLING, SOB, OR LIGHTHEADEDNESS WITH HYPOTENSION:  #  #  #  YES  #  #  #   Has patient had a PCN reaction causing severe rash involving  mucus membranes or skin necrosis: No Has patient had a PCN reaction that required hospitalization: No Has patient had a PCN reaction occurring within the last 10 years: No     Medications:  I have reviewed the patient's current medications. Prior to Admission:  Medications Prior to Admission  Medication Sig Dispense Refill Last Dose  . acetaminophen (TYLENOL) 325 MG tablet Take 650 mg by mouth every 6 (six) hours as needed.   prn at prn  . Acetaminophen 500 MG coapsule Take 500 mg by mouth 3  (three) times daily.    12/19/2018 at 0600  . apixaban (ELIQUIS) 2.5 MG TABS tablet Take 1 tablet (2.5 mg total) by mouth 2 (two) times daily. 180 tablet 2 12/19/2018 at 0600  . Calcium Carbonate-Vitamin D (CALCIUM-VITAMIN D) 600-125 MG-UNIT TABS Take by mouth daily.   12/19/2018 at 0600  . Cholecalciferol (VITAMIN D-1000 MAX ST) 25 MCG (1000 UT) tablet TAKE (1) TABLET BY MOUTH ONCE A DAY   12/19/2018 at 0600  . clobetasol cream (TEMOVATE) 0.05 % Apply 1 application topically See admin instructions. Apply small amount vaginally on Monday and Friday   12/18/2018 at 2000  . estradiol (ESTRACE) 0.1 MG/GM vaginal cream Place 1 Applicatorful vaginally See admin instructions. Apply 0.5 grams vaginally Tuesday and Thursday morning   Past Week at 2000  . furosemide (LASIX) 20 MG tablet Take 1 tablet (20 mg total) by mouth daily. 5 tablet 0 12/19/2018 at 0600  . iron polysaccharides (NIFEREX) 150 MG capsule Take 150 mg by mouth daily.   12/19/2018 at 0600  . levothyroxine (SYNTHROID, LEVOTHROID) 50 MCG tablet Take 50 mcg by mouth daily before breakfast.    12/19/2018 at 0600  . loperamide (IMODIUM) 2 MG capsule Take 2 mg by mouth 2 (two) times daily.   12/19/2018 at 0600  . loratadine (CLARITIN) 10 MG tablet Take 10 mg by mouth daily.    12/19/2018 at 0600  . memantine (NAMENDA) 10 MG tablet Take 10 mg by mouth 2 (two) times daily.   0 12/19/2018 at 0600  . nitrofurantoin (MACRODANTIN) 50 MG capsule Take 1 capsule (50 mg total) by mouth at bedtime. 30 capsule 2 12/18/2018 at 2000  . nystatin (NYSTATIN) powder Apply 1 application topically 2 (two) times daily as needed.   prn at prn  . OLANZapine (ZYPREXA) 10 MG tablet Take 10 mg by mouth at bedtime.   12/18/2018 at 2000  . omeprazole (PRILOSEC) 20 MG capsule Take 1 capsule (20 mg total) by mouth daily. 90 capsule 3 12/19/2018 at 0600  . polyvinyl alcohol (LIQUIFILM TEARS) 1.4 % ophthalmic solution Place 1 drop into both eyes as needed for dry eyes.   prn at  prn  . sennosides-docusate sodium (SENOKOT-S) 8.6-50 MG tablet Take 1 tablet by mouth daily.   12/19/2018 at 0600  . sertraline (ZOLOFT) 50 MG tablet Take 1 tablet (50 mg total) by mouth daily. 30 tablet 2 12/19/2018 at 0600  . vitamin B-12 (CYANOCOBALAMIN) 100 MCG tablet Take 1,000 mcg by mouth daily.    12/19/2018 at 0600  . clindamycin (CLEOCIN) 150 MG capsule Take 150 mg by mouth 3 (three) times daily. As needed for dental procedures   prn at prn  . sulfamethoxazole-trimethoprim (BACTRIM DS) 800-160 MG tablet Take 1 tablet by mouth 2 (two) times daily.   Completed Course at Unknown time   Scheduled: . enoxaparin (LOVENOX) injection  40 mg Subcutaneous Q24H  . levothyroxine  25 mcg Intravenous Daily  . modafinil  100  mg Oral Daily  . thiamine injection  100 mg Intravenous Daily    ROS: Unable to provide due to mental status  Physical Examination: Blood pressure (!) 156/74, pulse (!) 101, temperature 98.4 F (36.9 C), temperature source Axillary, resp. rate 17, height 5\' 3"  (1.6 m), weight 52.3 kg, SpO2 98 %.   Neurological Examination   Mental Status: Alert, perseverates.  Not oriented.   Cranial Nerves: II: Visual fields grossly normal, pupils equal, round, reactive to light and accommodation III,IV, VI: ptosis not present, extra-ocular motions intact bilaterally V,VII: smile symmetric, facial light touch sensation normal bilaterally VIII: hearing normal bilaterally IX,X: gag reflex present XI: bilateral shoulder shrug XII: midline tongue extension Motor: RUE in sling.  Moves all other extremities against gravity spontaneously Sensory: Pinprick and light touch intact throughout, bilaterally Deep Tendon Reflexes: Symmetric throughout Plantars: Right: mute   Left: upgoing Cerebellar: Unable to perform due to confusion Gait: not tested due to safety concerns    Laboratory Studies:   Basic Metabolic Panel: Recent Labs  Lab 12/20/18 0424 12/20/18 1244 12/22/18 0429  12/23/18 0451 12/24/18 0909  NA 146* 147* 148* 140 137  K 4.0 3.9 3.2* 4.6 3.9  CL 115* 115* 115* 108 105  CO2 19* 19* 24 22 19*  GLUCOSE 72 75 100* 105* 81  BUN 45* 36* 17 16 12   CREATININE 0.92 0.74 0.67 0.75 0.48  CALCIUM 8.8* 8.9 8.9 8.6* 8.6*  MG  --   --  1.8 1.8 2.0    Liver Function Tests: Recent Labs  Lab 12/19/18 1316 12/20/18 0424 12/22/18 0429 12/23/18 0451 12/24/18 0909  AST 28 21 21 21 25   ALT 17 13 12 13 16   ALKPHOS 82 76 76 88 106  BILITOT 1.2 1.2 1.3* 1.4* 1.5*  PROT 7.5 6.7 6.1* 6.3* 6.8  ALBUMIN 3.8 3.4* 3.0* 3.0* 3.3*   No results for input(s): LIPASE, AMYLASE in the last 168 hours. Recent Labs  Lab 12/20/18 1244  AMMONIA 27    CBC: Recent Labs  Lab 12/19/18 1316 12/20/18 0424 12/22/18 0429 12/23/18 0451 12/24/18 0521  WBC 8.7 6.8 6.6 7.5 8.7  NEUTROABS  --   --  4.9 5.4 6.8  HGB 8.9* 8.2* 8.0* 8.7* 9.7*  HCT 28.5* 28.1* 27.1* 29.6* 29.7*  MCV 82.4 87.8 87.4 88.1 80.5  PLT 300 337 255 236 192    Cardiac Enzymes: No results for input(s): CKTOTAL, CKMB, CKMBINDEX, TROPONINI in the last 168 hours.  BNP: Invalid input(s): POCBNP  CBG: No results for input(s): GLUCAP in the last 168 hours.  Microbiology: Results for orders placed or performed during the hospital encounter of 12/19/18  Urine culture     Status: None   Collection Time: 12/19/18  5:39 PM   Specimen: Urine, Catheterized  Result Value Ref Range Status   Specimen Description   Final    URINE, CATHETERIZED Performed at [facility], [address] [place], [place] [address]    Special Requests   Final    NONE Performed at [facility], [address] [place], [place] [address]    Culture   Final    NO GROWTH Performed at [facility], [place] [address], [place], [place] [address]    Report Status 12/21/2018 FINAL  Final  SARS CORONAVIRUS 2 (TAT 6-24 HRS) Nasopharyngeal Nasopharyngeal Swab     Status: Abnormal   Collection Time: 12/19/18  10:54 PM   Specimen: Nasopharyngeal Swab  Result Value Ref Range Status  SARS Coronavirus 2 POSITIVE (A) NEGATIVE Final    Comment: RESULT CALLED TO, READ BACK BY AND VERIFIED WITH: JADIE COHEN RN.@[email or web address]  ON 12.19.2020 BY TCALDWELL MT. (NOTE) SARS-CoV-2 target nucleic acids are DETECTED. The SARS-CoV-2 RNA is generally detectable in upper and lower respiratory specimens during the acute phase of infection. Positive results are indicative of the presence of SARS-CoV-2 RNA. Clinical correlation with patient history and other diagnostic information is  necessary to determine patient infection status. Positive results do not rule out bacterial infection or co-infection with other viruses.  The expected result is Negative. Fact Sheet for Patients: HairSlick.nohttps://www.fda.gov/media/138098/download Fact Sheet for Healthcare Providers: quierodirigir.comhttps://www.fda.gov/media/138095/download This test is not yet approved or cleared by the [place] FDA and  has been authorized for detection and/or diagnosis of SARS-CoV-2 by FDA under an Emergency Use Authorization (EUA). This EUA will remain  in effect (meaning this test can  be used) for the duration of the COVID-19 declaration under Section 564(b)(1) of the Act, 21 U.S.C. section 360bbb-3(b)(1), unless the authorization is terminated or revoked sooner. Performed at [facility], [address] [place], [place] [address]   MRSA PCR Screening     Status: None   Collection Time: 12/20/18  1:27 AM   Specimen: Nasopharyngeal  Result Value Ref Range Status   MRSA by PCR NEGATIVE NEGATIVE Final    Comment:        The GeneXpert MRSA Assay (FDA approved for NASAL specimens only), is one component of a comprehensive MRSA colonization surveillance program. It is not intended to diagnose MRSA infection nor to guide or monitor treatment for MRSA infections. Performed at [facility], [address] [place], [place] [address]      Coagulation Studies: No results for input(s): LABPROT, INR in the last 72 hours.  Urinalysis:  Recent Labs  Lab 12/19/18 1739  COLORURINE YELLOW*  LABSPEC 1.017  PHURINE 5.0  GLUCOSEU NEGATIVE  HGBUR NEGATIVE  BILIRUBINUR NEGATIVE  KETONESUR 5*  PROTEINUR NEGATIVE  NITRITE NEGATIVE  LEUKOCYTESUR NEGATIVE    Lipid Panel:     Component Value Date/Time   CHOL 191 12/22/2018 0429   TRIG 99 12/22/2018 0429   HDL 39 (L) 12/22/2018 0429   CHOLHDL 4.9 12/22/2018 0429   VLDL 20 12/22/2018 0429   LDLCALC 132 (H) 12/22/2018 0429    HgbA1C:  Lab Results  Component Value Date   HGBA1C 5.0 02/06/2017    Urine Drug Screen:  No results found for: LABOPIA, COCAINSCRNUR, LABBENZ, AMPHETMU, THCU, LABBARB  Alcohol Level: No results for input(s): ETH in the last 168 hours.  Other results: EKG: normal sinus rhythm at 88 bpm, LBBB.  Imaging: EEG  Result Date: 12/22/2018 Charlsie QuestYadav, Priyanka O, MD     12/22/2018  4:26 PM Patient Name: [name] MRN: [id number] Epilepsy Attending: Charlsie QuestPriyanka O Yadav Referring Physician/Provider: Dr Thana FarrLeslie Reynolds Date: 12/22/2018 Duration: 25.39 mins Patient history: 85yo F with COVID+ and ams. EEG to evaluate for seizure Level of alertness: awake AEDs during EEG study: None Technical aspects: This EEG study was done with scalp electrodes positioned according to the 10-20 International system of electrode placement. Electrical activity was acquired at a sampling rate of 500Hz  and reviewed with a high frequency filter of 70Hz  and a low frequency filter of 1Hz . EEG data were recorded continuously and digitally stored. DESCRIPTION: During awake state, no clear posterior dominant rhythm was seen. Continuous slow 3-6Hz  theta-delta slowing was seen. Triphasic waves, generalized, maximal bifrontal were seen at times. Photic  driving was not seen during photic stimulation. Hyperventilation was not performed ABNORMALITY - Continuous slow, generalized - Triphasic  waves, generalized, maximal bifrontal IMPRESSION: This study is suggestive of moderate to severe diffuse encephalopathy, non specific to etiology but could be secondary to toxic-metabolic causes. No seizures or epileptiform discharges were seen throughout the recording. Charlsie Quest     Assessment/Plan: 85 year old female with multiple medical problems including dementia admitted with altered mental status and frequent falls.  From review of record patient has had multiple encounters this year with altered mental status.  On Namenda and Zyprexa prior to admission.  With this admission patient with AKI and dehydration.  Has had recent COVID infection.  MRI of the brain reviewed and there is a small acute right parietal infarct noted.  Likely embolic with history of atrial fibrillation on Eliquis.  Suspect mental status is multifactorial and related to stroke, AKI, dehydration, recent COVID and hospitalization superimposed on an underlying dementia.  TSH, B12, ammonia are unremarkable.  - s/p discussion with daughter.  - Family does not want NG tube  - They want to focus on comfort - Palliative care consulted and appreciated.

## 2018-12-25 DIAGNOSIS — Z515 Encounter for palliative care: Secondary | ICD-10-CM

## 2018-12-25 LAB — VITAMIN B1: Vitamin B1 (Thiamine): 69 nmol/L (ref 66.5–200.0)

## 2018-12-25 MED ORDER — ACETAMINOPHEN 650 MG RE SUPP: 650.0000 mg | Freq: Four times a day (QID) | RECTAL | 0 refills | Status: AC | PRN

## 2018-12-25 MED ORDER — POLYVINYL ALCOHOL 1.4 % OP SOLN: 1.0000 [drp] | Freq: Four times a day (QID) | OPHTHALMIC | 0 refills | Status: AC | PRN

## 2018-12-25 MED ORDER — HALOPERIDOL LACTATE 2 MG/ML PO CONC: 0.5000 mg | ORAL | 0 refills | Status: AC | PRN

## 2018-12-25 MED ORDER — BIOTENE DRY MOUTH MT LIQD: 15.0000 mL | OROMUCOSAL | Status: AC | PRN

## 2018-12-25 MED ORDER — LORAZEPAM 2 MG/ML PO CONC: 1.0000 mg | ORAL | 0 refills | Status: AC | PRN

## 2018-12-25 MED ORDER — ONDANSETRON 4 MG PO TBDP: 4.0000 mg | ORAL_TABLET | Freq: Four times a day (QID) | ORAL | 0 refills | Status: AC | PRN

## 2018-12-25 MED ORDER — MORPHINE SULFATE 20 MG/5ML PO SOLN: 2.5000 mg | ORAL | 0 refills | Status: AC | PRN

## 2018-12-25 MED ORDER — GLYCOPYRROLATE 0.2 MG/ML IJ SOLN: 0.2000 mg | INTRAMUSCULAR | Status: AC | PRN

## 2018-12-25 NOTE — TOC Transition Note (Signed)
Transition of Care [facility]) - CM/SW Discharge Note   Patient Details  Name: Kelsey Le MRN: [id number] Date of Birth: August 22, 1930  [facility] CM/SW Contact:  Su Hilt, RN Phone Number: 12/25/2018, 8:41 AM   Clinical Narrative:     Patient to Discharge to [place] via EMS transport, Lysle Rubens with [facility] making arrangements, DC packet on the chart, Family is aware        Patient Goals and CMS Choice        Discharge Placement                       Discharge Plan and Services                                     Social Determinants of Health (SDOH) Interventions     Readmission Risk Interventions No flowsheet data found.

## 2018-12-25 NOTE — Discharge Summary (Signed)
Physician Discharge Summary  Kelsey Le GYF:[id number] DOB: 1930-08-12 DOA: 12/19/2018  PCP: Idelle Crouch, MD  Admit date: 12/19/2018 Discharge date: 12/25/2018  Admitted From: SNF, [facility] Disposition:  Hospice  Recommendations for Outpatient Follow-up:  1. None  Home Health: No  Equipment/Devices: None   Discharge Condition: Guarded   CODE STATUS: DNR  Diet recommendation: NPO except sublingual medications   Brief/Interim Summary:  85 y.o.female, ALF resident,with medical history significant ofdiastolic CHF,pulmonary embolism on chronic anticoagulation,dementia, recurrent AVMs, asthma, iron deficiency anemia,recurrent UTI's,who has had recent recurrentfalls at the facility including right humeral shaft fracture currently in immobilizer.Brought to ED on 12/18 due to worsening mental status. In the ED, afebrile, mildly hypertensive, otherwise normal vitals. Labs were notable for AKI and dehydration, stable anemia. UA negative for UTI. CXR negative. Head CT without acute findings. Patient treated with IV fluids and admitted for further management.MRI subsequently showed R parietal acute to early subacute infarct. Patient has remained minimally responsive, mostly somnolent, does not appear to recognize daughter and not able to follow commands. At baseline, patient's dementia is quite mild, per daughter. Palliative care team consulted, met with family 12/23 and decision was made to transition to comfort care. Patient accepted to residential hospice, for discharge today.   Discharge Diagnoses: Principal Problem:   Stroke [facility]) Active Problems:   Dehydration   Acute metabolic encephalopathy   Hypernatremia   Hospice care patient   Dementia with psychosis ([facility])   Right humeral fracture   Acute renal failure (ARF) (H[facility])   Iron deficiency anemia   S/P TAVR (transcatheter aortic valve replacement)   Palliative care patient   Hospice Care  Patient Continue comfort medications as needed --acetaminophen suppository PRN mild pain or fever>101 --biotene oral rins --Robinul sublingual as needed for secretions --Ativan sublingual as needed for anxiety --Haldol sublingual as needed for agitation/delirium --Morphine sublingual as needed for mod/severe pain or air hunger --Zofran ODT prn nausea/vomiting  Acute metabolic encephalopathy-suspect this ismultifactorialsecondary tostroke,dehydration and acute renal failure, possible recent Covid contributing, hypoactive delirium in setting of underlying dementia. Urinalysis ruledout UTI. Glucose has been within normal limits. Does not appear to be on medications outpatient that would cause this. MRI obtained and showed acute/early subacute right parietal infarct.Suspect some component of hypoactive delirium given underlying dementia. --comfort careas above  Acute / Subacute CVA- present on admission MRI showed R parietal infarct.No known hx of A-fib per daughter. Possibly has been hypercoaguable w recent Covid-19 infection, but has been on Eliquis (at low dose 2.71m due to hx of bleeding). Carotid dopplers without significant stenosis. Echo showed EF 55-60%.  Stopping Eliquis now on comfort care.  Hypernatremia-resolved. Sodium 142 on admission,peaked at148, normal at 140 after D5W given.  --no further labs, comfort care   Acute kidney injury,present on admission.Resolved with IV hydration. Prerenal azotemia due to poor p.o. intake.   Dehydration-due to poor p.o. intake,causing AKI and hypernatremia, and likely altered mental status. Could be due to advancing dementia versus recent illnesses including Covid and UTI,or multifactorial.  Iron deficiency anemia-chronic, stable No evidence of active bleeding. Baseline hemoglobin appears to be around 9-10.  Dementia without behavioral disturbance-currently altered from her baseline as outlined above.   Progressive this year without visitors.  Right humeral shaft fracture -treat for any signs of pain or discomfort per comfort care orders  History of aortic valve replacement/ TAVR-stable.  History of PE on chronic anticoagulation-acute PE diagnosed in 2017, on Eliquis since that time. --stopping Eliquis, comfort care  Discharge Instructions   Discharge Instructions    Call MD for:   Complete by: As directed    Signs of discomfort   Call MD for:  severe uncontrolled pain   Complete by: As directed    Diet - low sodium heart healthy   Complete by: As directed    Increase activity slowly   Complete by: As directed      Allergies as of 12/25/2018      Reactions   Aspirin Other (See Comments)   GI bleeding    Penicillins Rash, Other (See Comments)   PATIENT HAS HAD A PCN REACTION WITH IMMEDIATE RASH, FACIAL/TONGUE/THROAT SWELLING, SOB, OR LIGHTHEADEDNESS WITH HYPOTENSION:  #  #  #  YES  #  #  #   Has patient had a PCN reaction causing severe rash involving mucus membranes or skin necrosis: No Has patient had a PCN reaction that required hospitalization: No Has patient had a PCN reaction occurring within the last 10 years: No      Medication List    STOP taking these medications   acetaminophen 325 MG tablet Commonly known as: TYLENOL   Acetaminophen 500 MG coapsule Replaced by: acetaminophen 650 MG suppository   apixaban 2.5 MG Tabs tablet Commonly known as: Eliquis   Calcium-Vitamin D 600-125 MG-UNIT Tabs   clindamycin 150 MG capsule Commonly known as: CLEOCIN   clobetasol cream 0.05 % Commonly known as: TEMOVATE   estradiol 0.1 MG/GM vaginal cream Commonly known as: ESTRACE   furosemide 20 MG tablet Commonly known as: Lasix   iron polysaccharides 150 MG capsule Commonly known as: NIFEREX   levothyroxine 50 MCG tablet Commonly known as: SYNTHROID   loperamide 2 MG capsule Commonly known as: IMODIUM   loratadine 10 MG tablet Commonly  known as: CLARITIN   memantine 10 MG tablet Commonly known as: NAMENDA   nitrofurantoin 50 MG capsule Commonly known as: MACRODANTIN   nystatin powder Generic drug: nystatin   OLANZapine 10 MG tablet Commonly known as: ZYPREXA   omeprazole 20 MG capsule Commonly known as: PRILOSEC   sennosides-docusate sodium 8.6-50 MG tablet Commonly known as: SENOKOT-S   sertraline 50 MG tablet Commonly known as: ZOLOFT   sulfamethoxazole-trimethoprim 800-160 MG tablet Commonly known as: BACTRIM DS   vitamin B-12 100 MCG tablet Commonly known as: CYANOCOBALAMIN   Vitamin D-1000 Max St 25 MCG (1000 UT) tablet Generic drug: Cholecalciferol     TAKE these medications   acetaminophen 650 MG suppository Commonly known as: TYLENOL Place 1 suppository (650 mg total) rectally every 6 (six) hours as needed for mild pain (or Fever >/= 101). Replaces: Acetaminophen 500 MG coapsule   antiseptic oral rinse Liqd Apply 15 mLs topically as needed for dry mouth.   glycopyrrolate 0.2 MG/ML injection Commonly known as: ROBINUL Inject 1 mL (0.2 mg total) into the skin every 4 (four) hours as needed (excessive secretions).   haloperidol 2 MG/ML solution Commonly known as: HALDOL Place 0.3 mLs (0.6 mg total) under the tongue every 4 (four) hours as needed for agitation (or delirium).   LORazepam 2 MG/ML concentrated solution Commonly known as: ATIVAN Place 0.5 mLs (1 mg total) under the tongue every 4 (four) hours as needed for anxiety.   morphine 20 MG/5ML solution Take 0.6 mLs (2.4 mg total) by mouth every 2 (two) hours as needed for pain. Or 1.0 mL (72m) for air hunger/shortness of breath   ondansetron 4 MG disintegrating tablet Commonly known as: ZOFRAN-ODT Take 1 tablet (4  mg total) by mouth every 6 (six) hours as needed for nausea.   polyvinyl alcohol 1.4 % ophthalmic solution Commonly known as: LIQUIFILM TEARS Place 1 drop into both eyes 4 (four) times daily as needed for dry  eyes. What changed: when to take this       Allergies  Allergen Reactions  . Aspirin Other (See Comments)    GI bleeding   . Penicillins Rash and Other (See Comments)    PATIENT HAS HAD A PCN REACTION WITH IMMEDIATE RASH, FACIAL/TONGUE/THROAT SWELLING, SOB, OR LIGHTHEADEDNESS WITH HYPOTENSION:  #  #  #  YES  #  #  #   Has patient had a PCN reaction causing severe rash involving mucus membranes or skin necrosis: No Has patient had a PCN reaction that required hospitalization: No Has patient had a PCN reaction occurring within the last 10 years: No     Consultations:  Neurology  Palliative Care    Procedures/Studies: EEG  Result Date: 12/22/2018 Lora Havens, MD     12/22/2018  4:26 PM Patient Name: SARITA HAKANSON MRN: [id number] Epilepsy Attending: Lora Havens Referring Physician/Provider: Dr Alexis Goodell Date: 12/22/2018 Duration: 25.39 mins Patient history: 86yo F with COVID+ and ams. EEG to evaluate for seizure Level of alertness: awake AEDs during EEG study: None Technical aspects: This EEG study was done with scalp electrodes positioned according to the 10-20 International system of electrode placement. Electrical activity was acquired at a sampling rate of 500Hz  and reviewed with a high frequency filter of 70Hz  and a low frequency filter of 1Hz . EEG data were recorded continuously and digitally stored. DESCRIPTION: During awake state, no clear posterior dominant rhythm was seen. Continuous slow 3-6Hz  theta-delta slowing was seen. Triphasic waves, generalized, maximal bifrontal were seen at times. Photic driving was not seen during photic stimulation. Hyperventilation was not performed ABNORMALITY - Continuous slow, generalized - Triphasic waves, generalized, maximal bifrontal IMPRESSION: This study is suggestive of moderate to severe diffuse encephalopathy, non specific to etiology but could be secondary to toxic-metabolic causes. No seizures or epileptiform discharges  were seen throughout the recording. Lora Havens   DG Chest 1 View  Result Date: 12/09/2018 CLINICAL DATA:  Fall. COVID-19 positive EXAM: CHEST  1 VIEW COMPARISON:  04/20/2018 FINDINGS: There are airspace opacities in the right mid lung. Small left pleural effusion with basilar atelectasis. Cardiomediastinal size is normal. Prosthetic aortic valve and calcific aortic atherosclerosis again noted. IMPRESSION: 1. Right midlung airspace disease may indicate developing consolidation, including the possibility of pneumonia. 2. Small left pleural effusion with basilar atelectasis. 3. Aortic atherosclerosis. Electronically Signed   By: Ulyses Jarred M.D.   On: 12/09/2018 19:49   DG Pelvis 1-2 Views  Result Date: 12/09/2018 CLINICAL DATA:  Fall with pelvic pain EXAM: PELVIS - 1-2 VIEW COMPARISON:  Right hip fluoroscopy 11/18/2015 FINDINGS: Surgical repair of both proximal femurs. There is no acute fracture of either femur. There is contour irregularity of the right inferior pubic ramus. No other evidence for pelvic fracture. IMPRESSION: 1. No acute abnormality of the surgically repaired femurs. 2. Irregularity of the right inferior pubic ramus, which may indicate a nondisplaced fracture. Electronically Signed   By: Ulyses Jarred M.D.   On: 12/09/2018 19:47   DG Shoulder Right  Result Date: 12/09/2018 CLINICAL DATA:  Fall EXAM: RIGHT SHOULDER - 2+ VIEW COMPARISON:  None. FINDINGS: There is a comminuted fracture of the surgical neck of the right humerus that also involves the humeral head. There  is no glenohumeral dislocation. Normal clavicle. Normal AC joint. IMPRESSION: Comminuted fracture of the surgical neck of the right humerus with extension to the humeral head. No glenohumeral dislocation. Electronically Signed   By: Ulyses Jarred M.D.   On: 12/09/2018 19:44   CT Head Wo Contrast  Result Date: 12/19/2018 CLINICAL DATA:  Altered mental status. EXAM: CT HEAD WITHOUT CONTRAST TECHNIQUE: Contiguous  axial images were obtained from the base of the skull through the vertex without intravenous contrast. COMPARISON:  December 09, 2018. FINDINGS: Brain: Mild diffuse cortical atrophy is noted. Mild chronic ischemic white matter disease is noted. No mass effect or midline shift is noted. Ventricular size is within normal limits. There is no evidence of mass lesion, hemorrhage or acute infarction. Vascular: No hyperdense vessel or unexpected calcification. Skull: Normal. Negative for fracture or focal lesion. Sinuses/Orbits: No acute finding. Other: None. IMPRESSION: Mild diffuse cortical atrophy. Mild chronic ischemic white matter disease. No acute intracranial abnormality seen. Electronically Signed   By: Marijo Conception M.D.   On: 12/19/2018 17:03   CT Head Wo Contrast  Result Date: 12/09/2018 CLINICAL DATA:  Fall EXAM: CT HEAD WITHOUT CONTRAST TECHNIQUE: Contiguous axial images were obtained from the base of the skull through the vertex without intravenous contrast. COMPARISON:  06/19/2018 FINDINGS: Brain: There is no mass, hemorrhage or extra-axial collection. There is generalized atrophy without lobar predilection. Hypodensity of the white matter is most commonly associated with chronic microvascular disease. Vascular: No abnormal hyperdensity of the major intracranial arteries or dural venous sinuses. No intracranial atherosclerosis. Skull: The visualized skull base, calvarium and extracranial soft tissues are normal. Sinuses/Orbits: No fluid levels or advanced mucosal thickening of the visualized paranasal sinuses. No mastoid or middle ear effusion. The orbits are normal. IMPRESSION: Generalized atrophy and chronic microvascular ischemia without acute intracranial abnormality. Electronically Signed   By: Ulyses Jarred M.D.   On: 12/09/2018 21:50   MR BRAIN WO CONTRAST  Result Date: 12/20/2018 CLINICAL DATA:  Encephalopathy. EXAM: MRI HEAD WITHOUT CONTRAST TECHNIQUE: Multiplanar, multiecho pulse  sequences of the brain and surrounding structures were obtained without intravenous contrast. COMPARISON:  Head CT 12/19/2018 and MRI 11/12/2014 FINDINGS: Brain: There is a 3 mm focus of hyperintense trace diffusion weighted signal in the subcortical white matter of the right parietal lobe with suggestion of subtly reduced ADC. No acute infarct is evident elsewhere. No intracranial hemorrhage, mass, midline shift, or extra-axial fluid collection is identified. Periventricular white matter T2 hyperintensities are unchanged from the prior MRI and nonspecific but compatible with chronic small vessel ischemia, minimal for age. Punctate chronic infarcts are suspected in the cerebellum bilaterally, not clearly present on the prior MRI. Generalized cerebral atrophy has mildly progressed from the prior MRI. Vascular: Major intracranial vascular flow voids are preserved. Skull and upper cervical spine: Unremarkable bone marrow signal. Sinuses/Orbits: Bilateral cataract extraction. Clear paranasal sinuses. Trace bilateral mastoid fluid. Other: None. IMPRESSION: 1. Suspected punctate acute/early subacute right parietal infarct. 2. Minimal chronic small vessel ischemic disease with punctate chronic cerebellar infarcts. Electronically Signed   By: Logan Bores M.D.   On: 12/20/2018 19:22   US Carotid Bilateral  Result Date: 12/22/2018 CLINICAL DATA:  Cerebral infarct. EXAM: BILATERAL CAROTID DUPLEX ULTRASOUND TECHNIQUE: Pearline Cables scale imaging, color Doppler and duplex ultrasound were performed of bilateral carotid and vertebral arteries in the neck. COMPARISON:  None. FINDINGS: Criteria: Quantification of carotid stenosis is based on velocity parameters that correlate the residual internal carotid diameter with NASCET-based stenosis levels, using the diameter  of the distal internal carotid lumen as the denominator for stenosis measurement. The following velocity measurements were obtained: RIGHT ICA:  58/5 cm/sec CCA:  42/35  cm/sec SYSTOLIC ICA/CCA RATIO:  0.6 ECA:  59 cm/sec LEFT ICA:  68/13 cm/sec CCA:  36/14 cm/sec SYSTOLIC ICA/CCA RATIO:  1.4 ECA:  61 cm/sec RIGHT CAROTID ARTERY: Intimal thickening in the common carotid artery and carotid bulb. No focal plaque identified. No evidence of right ICA stenosis. RIGHT VERTEBRAL ARTERY: Antegrade flow with normal waveform and velocity. LEFT CAROTID ARTERY: Mild plaque at the level of the left carotid bulb and left ICA origin. No significant stenosis identified with estimated left ICA stenosis of less than 50%. LEFT VERTEBRAL ARTERY: Antegrade flow with normal waveform and velocity. IMPRESSION: Mild plaque at the level of the left carotid bulb and left ICA origin. Estimated left ICA stenosis is less than 50%. Electronically Signed   By: Aletta Edouard M.D.   On: 12/22/2018 07:50   DG Chest Port 1 View  Result Date: 12/19/2018 CLINICAL DATA:  Altered mental status. COVID. Recent RIGHT humeral fracture. EXAM: PORTABLE CHEST 1 VIEW COMPARISON:  12/09/2018 FINDINGS: The patient is rotated and leaning towards the RIGHT and this is a mildly low volume study. Mild RIGHT mid and lower lung opacities have slightly improved. Improved aeration in the LEFT lung base noted. No definite pleural effusion or pneumothorax. The cardiomediastinal silhouette is unchanged given technique. Aortic valve replacement again noted. Proximal RIGHT humeral fracture again noted. IMPRESSION: Improved RIGHT lung opacities and improved LEFT basilar aeration without other significant change. Electronically Signed   By: Margarette Canada M.D.   On: 12/19/2018 19:12   ECHOCARDIOGRAM COMPLETE  Result Date: 12/22/2018   ECHOCARDIOGRAM REPORT   Patient Name:   GEORGIE EDUARDO Date of Exam: 12/22/2018 Medical Rec #:  [id number]      Height:       63.0 in Accession #:    [id number]     Weight:       115.4 lb Date of Birth:  December 25, 1930      BSA:          1.53 m Patient Age:    73 years       BP:           169/60 mmHg Patient  Gender: F              HR:           97 bpm. Exam Location:  [facility] Procedure: 2D Echo, Color Doppler and Cardiac Doppler Indications:     Stroke 434.91  History:         Patient has prior history of Echocardiogram examinations, most                  recent 01/31/2018. Syncope and collapse, s/p TAVR.  Sonographer:     Sherrie Sport RDCS (AE) Referring Phys:  [id number] Claiborne Billings A Selvin Yun Diagnosing Phys: Kathlyn Sacramento MD  Sonographer Comments: Technically difficult study due to poor echo windows, suboptimal parasternal window, no apical window and no subcostal window. Pt had right arm in sling and large tight elastic bandage around torso. Pt could not be positioned on left side. IMPRESSIONS  1. Technically difficult study due to poor echo windows, suboptimal parasternal window, no apical window and no subcostal window  2. Left ventricular ejection fraction, by visual estimation, is 55 to 60%. The left ventricle has normal function. There is moderately increased left ventricular hypertrophy.  3. The left  ventricle has no regional wall motion abnormalities.  4. Global right ventricle has normal systolic function.The right ventricular size is normal. No increase in right ventricular wall thickness.  5. Left atrial size was normal.  6. Right atrial size was not well visualized.  7. Small pericardial effusion.  8. The pericardial effusion is posterior to the left ventricle.  9. The mitral valve was not well visualized. No evidence of mitral valve regurgitation. No evidence of mitral stenosis. 10. The tricuspid valve is normal in structure. Tricuspid valve regurgitation is not demonstrated. 11. The aortic valve was not well visualized. Aortic valve regurgitation is not visualized. 12. The pulmonic valve was normal in structure. Pulmonic valve regurgitation is not visualized. 13. Left ventricular diastolic function could not be evaluated. 14. TR signal is inadequate for assessing pulmonary artery systolic pressure. 15. The aortic  root was not well visualized. FINDINGS  Left Ventricle: Left ventricular ejection fraction, by visual estimation, is 55 to 60%. The left ventricle has normal function. The left ventricle has no regional wall motion abnormalities. There is moderately increased left ventricular hypertrophy. Left ventricular diastolic function could not be evaluated. Normal left atrial pressure. Right Ventricle: The right ventricular size is normal. No increase in right ventricular wall thickness. Global RV systolic function is has normal systolic function. Left Atrium: Left atrial size was normal in size. Right Atrium: Right atrial size was not well visualized Pericardium: A small pericardial effusion is present. The pericardial effusion is posterior to the left ventricle. Mitral Valve: The mitral valve was not well visualized. No evidence of mitral valve regurgitation. No evidence of mitral valve stenosis by observation. Tricuspid Valve: The tricuspid valve is normal in structure. Tricuspid valve regurgitation is not demonstrated. Aortic Valve: The aortic valve was not well visualized. Aortic valve regurgitation is not visualized. Pulmonic Valve: The pulmonic valve was normal in structure. Pulmonic valve regurgitation is not visualized. Pulmonic regurgitation is not visualized. Aorta: The aortic root, ascending aorta and aortic arch are all structurally normal, with no evidence of dilitation or obstruction and the aortic root was not well visualized. Venous: The inferior vena cava was not well visualized. IAS/Shunts: No atrial level shunt detected by color flow Doppler. There is no evidence of a patent foramen ovale. No ventricular septal defect is seen or detected. There is no evidence of an atrial septal defect.  LEFT VENTRICLE PLAX 2D LVIDd:         3.41 cm LVIDs:         2.35 cm LV PW:         1.21 cm LV IVS:        1.63 cm LVOT diam:     2.00 cm LV SV:         29 ml LV SV Index:   18.79 LVOT Area:     3.14 cm  LEFT ATRIUM          Index LA diam:    3.30 cm 2.16 cm/m   AORTA Ao Root diam: 2.20 cm  SHUNTS Systemic Diam: 2.00 cm  Kathlyn Sacramento MD Electronically signed by Kathlyn Sacramento MD Signature Date/Time: 12/22/2018/2:34:30 PM    Final       Echo EF 55-60%  Carotid dopplers 12/20   Subjective: Patient remains unresponsive.  No agitation reported.  Appears in no distress.   Discharge Exam: Vitals:   12/24/18 0000 12/24/18 0743  BP: (!) 179/79 (!) 156/74  Pulse: (!) 104 (!) 101  Resp:  17  Temp:  98.4 F (36.9 C)  SpO2:  98%   Vitals:   12/23/18 1534 12/23/18 2307 12/24/18 0000 12/24/18 0743  BP: (!) 148/65  (!) 179/79 (!) 156/74  Pulse: 87 (!) 106 (!) 104 (!) 101  Resp: 17 16  17   Temp: 98.2 F (36.8 C) 98.7 F (37.1 C)  98.4 F (36.9 C)  TempSrc: Axillary Oral  Axillary  SpO2: 98% 97%  98%  Weight:      Height:        General: Pt is alert, awake, not in acute distress Cardiovascular: RRR, S1/S2 +, no rubs, no gallops Respiratory: CTA bilaterally, no wheezing, no rhonchi Abdominal: Soft, NT, ND, bowel sounds + Extremities: no edema, no cyanosis, RUE in sling immbolizer    The results of significant diagnostics from this hospitalization (including imaging, microbiology, ancillary and laboratory) are listed below for reference.     Microbiology: Recent Results (from the past 240 hour(s))  Urine culture     Status: None   Collection Time: 12/19/18  5:39 PM   Specimen: Urine, Catheterized  Result Value Ref Range Status   Specimen Description   Final    URINE, CATHETERIZED Performed at [facility], [address] [place], [place] [address]    Special Requests   Final    NONE Performed at [facility], [address], [place], [place] [address]    Culture   Final    NO GROWTH Performed at [facility], [place] [address], [place], [place] [address]    Report Status 12/21/2018 FINAL  Final  SARS CORONAVIRUS 2 (TAT 6-24 HRS) Nasopharyngeal Nasopharyngeal  Swab     Status: Abnormal   Collection Time: 12/19/18 10:54 PM   Specimen: Nasopharyngeal Swab  Result Value Ref Range Status   SARS Coronavirus 2 POSITIVE (A) NEGATIVE Final    Comment: RESULT CALLED TO, READ BACK BY AND VERIFIED WITH: JADIE COHEN [email or web address].@[email or web address]  ON 12.19.2020 BY TCALDWELL MT. (NOTE) SARS-CoV-2 target nucleic acids are DETECTED. The SARS-CoV-2 RNA is generally detectable in upper and lower respiratory specimens during the acute phase of infection. Positive results are indicative of the presence of SARS-CoV-2 RNA. Clinical correlation with patient history and other diagnostic information is  necessary to determine patient infection status. Positive results do not rule out bacterial infection or co-infection with other viruses.  The expected result is Negative. Fact Sheet for Patients: SugarRoll.be Fact Sheet for Healthcare Providers: https://www.woods-mathews.com/ This test is not yet approved or cleared by the [place] FDA and  has been authorized for detection and/or diagnosis of SARS-CoV-2 by FDA under an Emergency Use Authorization (EUA). This EUA will remain  in effect (meaning this test can  be used) for the duration of the COVID-19 declaration under Section 564(b)(1) of the Act, 21 U.S.C. section 360bbb-3(b)(1), unless the authorization is terminated or revoked sooner. Performed at [facility], [place] [address] [place], [place] [address]   MRSA PCR Screening     Status: None   Collection Time: 12/20/18  1:27 AM   Specimen: Nasopharyngeal  Result Value Ref Range Status   MRSA by PCR NEGATIVE NEGATIVE Final    Comment:        The GeneXpert MRSA Assay (FDA approved for NASAL specimens only), is one component of a comprehensive MRSA colonization surveillance program. It is not intended to diagnose MRSA infection nor to guide or monitor treatment for MRSA infections. Performed at [facility], [address], [place], [place] [address]  Labs: BNP (last 3 results) No results for input(s): BNP in the last 8760 hours. Basic Metabolic Panel: Recent Labs  Lab 12/20/18 0424 12/20/18 1244 12/22/18 0429 12/23/18 0451 12/24/18 0909  NA 146* 147* 148* 140 137  K 4.0 3.9 3.2* 4.6 3.9  CL 115* 115* 115* 108 105  CO2 19* 19* 24 22 19*  GLUCOSE 72 75 100* 105* 81  BUN 45* 36* 17 16 12   CREATININE 0.92 0.74 0.67 0.75 0.48  CALCIUM 8.8* 8.9 8.9 8.6* 8.6*  MG  --   --  1.8 1.8 2.0   Liver Function Tests: Recent Labs  Lab 12/19/18 1316 12/20/18 0424 12/22/18 0429 12/23/18 0451 12/24/18 0909  AST 28 21 21 21 25   ALT 17 13 12 13 16   ALKPHOS 82 76 76 88 106  BILITOT 1.2 1.2 1.3* 1.4* 1.5*  PROT 7.5 6.7 6.1* 6.3* 6.8  ALBUMIN 3.8 3.4* 3.0* 3.0* 3.3*   No results for input(s): LIPASE, AMYLASE in the last 168 hours. Recent Labs  Lab 12/20/18 1244  AMMONIA 27   CBC: Recent Labs  Lab 12/19/18 1316 12/20/18 0424 12/22/18 0429 12/23/18 0451 12/24/18 0521  WBC 8.7 6.8 6.6 7.5 8.7  NEUTROABS  --   --  4.9 5.4 6.8  HGB 8.9* 8.2* 8.0* 8.7* 9.7*  HCT 28.5* 28.1* 27.1* 29.6* 29.7*  MCV 82.4 87.8 87.4 88.1 80.5  PLT 300 337 255 236 192   Cardiac Enzymes: No results for input(s): CKTOTAL, CKMB, CKMBINDEX, TROPONINI in the last 168 hours. BNP: Invalid input(s): POCBNP CBG: No results for input(s): GLUCAP in the last 168 hours. D-Dimer No results for input(s): DDIMER in the last 72 hours. Hgb A1c No results for input(s): HGBA1C in the last 72 hours. Lipid Profile No results for input(s): CHOL, HDL, LDLCALC, TRIG, CHOLHDL, LDLDIRECT in the last 72 hours. Thyroid function studies No results for input(s): TSH, T4TOTAL, T3FREE, THYROIDAB in the last 72 hours.  Invalid input(s): FREET3 Anemia work up No results for input(s): VITAMINB12, FOLATE, FERRITIN, TIBC, IRON, RETICCTPCT in the last 72 hours. Urinalysis    Component Value Date/Time   COLORURINE YELLOW (A)  12/19/2018 1739   APPEARANCEUR HAZY (A) 12/19/2018 1739   APPEARANCEUR Clear 01/29/2013 1042   LABSPEC 1.017 12/19/2018 1739   LABSPEC 1.019 01/29/2013 1042   PHURINE 5.0 12/19/2018 1739   GLUCOSEU NEGATIVE 12/19/2018 1739   GLUCOSEU Negative 01/29/2013 1042   HGBUR NEGATIVE 12/19/2018 1739   BILIRUBINUR NEGATIVE 12/19/2018 1739   BILIRUBINUR Negative 01/29/2013 1042   KETONESUR 5 (A) 12/19/2018 1739   PROTEINUR NEGATIVE 12/19/2018 1739   NITRITE NEGATIVE 12/19/2018 1739   LEUKOCYTESUR NEGATIVE 12/19/2018 1739   LEUKOCYTESUR Trace 01/29/2013 1042   Sepsis Labs Invalid input(s): PROCALCITONIN,  WBC,  LACTICIDVEN Microbiology Recent Results (from the past 240 hour(s))  Urine culture     Status: None   Collection Time: 12/19/18  5:39 PM   Specimen: Urine, Catheterized  Result Value Ref Range Status   Specimen Description   Final    URINE, CATHETERIZED Performed at [facility], [address], [place], [place] [address]    Special Requests   Final    NONE Performed at [facility], [address] [place], [place] [address]    Culture   Final    NO GROWTH Performed at [facility], [place] [address] [place], [place] [address]    Report Status 12/21/2018 FINAL  Final  SARS CORONAVIRUS 2 (TAT 6-24 HRS) Nasopharyngeal Nasopharyngeal  Swab     Status: Abnormal   Collection Time: 12/19/18 10:54 PM   Specimen: Nasopharyngeal Swab  Result Value Ref Range Status   SARS Coronavirus 2 POSITIVE (A) NEGATIVE Final    Comment: RESULT CALLED TO, READ BACK BY AND VERIFIED WITH: JADIE COHEN [email or web address].@[email or web address]  ON 12.19.2020 BY TCALDWELL MT. (NOTE) SARS-CoV-2 target nucleic acids are DETECTED. The SARS-CoV-2 RNA is generally detectable in upper and lower respiratory specimens during the acute phase of infection. Positive results are indicative of the presence of SARS-CoV-2 RNA. Clinical correlation with patient history and other diagnostic information is  necessary to  determine patient infection status. Positive results do not rule out bacterial infection or co-infection with other viruses.  The expected result is Negative. Fact Sheet for Patients: SugarRoll.be Fact Sheet for Healthcare Providers: https://www.woods-mathews.com/ This test is not yet approved or cleared by the [place] FDA and  has been authorized for detection and/or diagnosis of SARS-CoV-2 by FDA under an Emergency Use Authorization (EUA). This EUA will remain  in effect (meaning this test can  be used) for the duration of the COVID-19 declaration under Section 564(b)(1) of the Act, 21 U.S.C. section 360bbb-3(b)(1), unless the authorization is terminated or revoked sooner. Performed at [facility], [place] [address] [place], [place] [address]   MRSA PCR Screening     Status: None   Collection Time: 12/20/18  1:27 AM   Specimen: Nasopharyngeal  Result Value Ref Range Status   MRSA by PCR NEGATIVE NEGATIVE Final    Comment:        The GeneXpert MRSA Assay (FDA approved for NASAL specimens only), is one component of a comprehensive MRSA colonization surveillance program. It is not intended to diagnose MRSA infection nor to guide or monitor treatment for MRSA infections. Performed at [facility], [address] [place], [place] [address]      Time coordinating discharge: Over 30 minutes  SIGNED:   Ezekiel Slocumb, DO [facility] 12/25/2018, 7:25 AM   If 7PM-7AM, please contact night-coverage [email or web address] Password [email or web address]

## 2018-12-25 NOTE — Progress Notes (Signed)
Follow up visit made to new referral for [facility]. Daughter Juliann Pulse at bedside and remains in agreement with transfer to th hospice home this morning. Hospital care tam updated, report called to the hospice home. Staff RN Freddie Breech notified EMS for transport. Discharge summary faxed to referral. Flo Shanks BSN, RN, [place] [facility] [phone]

## 2019-01-02 DEATH — deceased
